# Patient Record
Sex: Male | Born: 1975 | Race: Black or African American | Hispanic: No | Marital: Single | Smoking: Former smoker
Health system: Southern US, Community
[De-identification: ages and names within clinical notes are randomized; demographics above are authoritative.]

## PROBLEM LIST (undated history)

## (undated) ENCOUNTER — Emergency Department (HOSPITAL_COMMUNITY): Payer: MEDICAID | Source: Home / Self Care

## (undated) DIAGNOSIS — F1910 Other psychoactive substance abuse, uncomplicated: Secondary | ICD-10-CM

## (undated) DIAGNOSIS — Z9114 Patient's other noncompliance with medication regimen: Secondary | ICD-10-CM

## (undated) DIAGNOSIS — Z91148 Patient's other noncompliance with medication regimen for other reason: Secondary | ICD-10-CM

## (undated) DIAGNOSIS — I10 Essential (primary) hypertension: Secondary | ICD-10-CM

## (undated) DIAGNOSIS — F319 Bipolar disorder, unspecified: Secondary | ICD-10-CM

## (undated) DIAGNOSIS — I119 Hypertensive heart disease without heart failure: Secondary | ICD-10-CM

## (undated) DIAGNOSIS — Z9861 Coronary angioplasty status: Secondary | ICD-10-CM

## (undated) DIAGNOSIS — Z720 Tobacco use: Secondary | ICD-10-CM

## (undated) DIAGNOSIS — F209 Schizophrenia, unspecified: Secondary | ICD-10-CM

## (undated) DIAGNOSIS — I472 Ventricular tachycardia: Secondary | ICD-10-CM

## (undated) DIAGNOSIS — I255 Ischemic cardiomyopathy: Secondary | ICD-10-CM

## (undated) DIAGNOSIS — I2510 Atherosclerotic heart disease of native coronary artery without angina pectoris: Secondary | ICD-10-CM

## (undated) DIAGNOSIS — F419 Anxiety disorder, unspecified: Secondary | ICD-10-CM

## (undated) DIAGNOSIS — I4729 Other ventricular tachycardia: Secondary | ICD-10-CM

## (undated) DIAGNOSIS — E785 Hyperlipidemia, unspecified: Secondary | ICD-10-CM

## (undated) HISTORY — DX: Hypertensive heart disease without heart failure: I11.9

## (undated) HISTORY — PX: FEMORAL ARTERY STENT: SHX1583

---

## 1998-11-12 ENCOUNTER — Emergency Department (HOSPITAL_COMMUNITY): Admission: EM | Admit: 1998-11-12 | Discharge: 1998-11-13 | Payer: Self-pay | Admitting: Emergency Medicine

## 2000-01-03 ENCOUNTER — Emergency Department (HOSPITAL_COMMUNITY): Admission: EM | Admit: 2000-01-03 | Discharge: 2000-01-03 | Payer: Self-pay | Admitting: Emergency Medicine

## 2000-05-03 ENCOUNTER — Emergency Department (HOSPITAL_COMMUNITY): Admission: EM | Admit: 2000-05-03 | Discharge: 2000-05-03 | Payer: Self-pay | Admitting: *Deleted

## 2001-10-25 ENCOUNTER — Emergency Department (HOSPITAL_COMMUNITY): Admission: EM | Admit: 2001-10-25 | Discharge: 2001-10-25 | Payer: Self-pay | Admitting: Emergency Medicine

## 2001-10-26 ENCOUNTER — Emergency Department (HOSPITAL_COMMUNITY): Admission: EM | Admit: 2001-10-26 | Discharge: 2001-10-26 | Payer: Self-pay | Admitting: Emergency Medicine

## 2002-05-22 ENCOUNTER — Emergency Department (HOSPITAL_COMMUNITY): Admission: AD | Admit: 2002-05-22 | Discharge: 2002-05-22 | Payer: Self-pay | Admitting: Emergency Medicine

## 2002-12-27 ENCOUNTER — Inpatient Hospital Stay (HOSPITAL_COMMUNITY): Admission: AD | Admit: 2002-12-27 | Discharge: 2002-12-28 | Payer: Self-pay | Admitting: Emergency Medicine

## 2003-01-01 ENCOUNTER — Encounter (INDEPENDENT_AMBULATORY_CARE_PROVIDER_SITE_OTHER): Payer: Self-pay | Admitting: *Deleted

## 2003-01-01 ENCOUNTER — Ambulatory Visit (HOSPITAL_COMMUNITY): Admission: RE | Admit: 2003-01-01 | Discharge: 2003-01-01 | Payer: Self-pay | Admitting: Dentistry

## 2003-01-16 ENCOUNTER — Encounter: Admission: RE | Admit: 2003-01-16 | Discharge: 2003-01-16 | Payer: Self-pay | Admitting: Dentistry

## 2006-01-29 ENCOUNTER — Emergency Department (HOSPITAL_COMMUNITY): Admission: EM | Admit: 2006-01-29 | Discharge: 2006-01-29 | Payer: Self-pay | Admitting: Emergency Medicine

## 2007-02-04 ENCOUNTER — Inpatient Hospital Stay (HOSPITAL_COMMUNITY): Admission: EM | Admit: 2007-02-04 | Discharge: 2007-02-07 | Payer: Self-pay | Admitting: Emergency Medicine

## 2007-02-05 HISTORY — PX: CORONARY ANGIOPLASTY WITH STENT PLACEMENT: SHX49

## 2009-01-15 ENCOUNTER — Observation Stay (HOSPITAL_COMMUNITY): Admission: EM | Admit: 2009-01-15 | Discharge: 2009-01-16 | Payer: Self-pay | Admitting: Emergency Medicine

## 2009-01-16 ENCOUNTER — Encounter: Payer: Self-pay | Admitting: Physician Assistant

## 2009-01-16 HISTORY — PX: CARDIAC CATHETERIZATION: SHX172

## 2009-01-27 ENCOUNTER — Ambulatory Visit: Payer: Self-pay | Admitting: Physician Assistant

## 2009-01-27 DIAGNOSIS — E785 Hyperlipidemia, unspecified: Secondary | ICD-10-CM

## 2009-01-27 DIAGNOSIS — F329 Major depressive disorder, single episode, unspecified: Secondary | ICD-10-CM

## 2009-01-27 DIAGNOSIS — I10 Essential (primary) hypertension: Secondary | ICD-10-CM

## 2009-01-27 DIAGNOSIS — I2510 Atherosclerotic heart disease of native coronary artery without angina pectoris: Secondary | ICD-10-CM

## 2009-01-27 DIAGNOSIS — Z9861 Coronary angioplasty status: Secondary | ICD-10-CM

## 2009-01-27 DIAGNOSIS — K2990 Gastroduodenitis, unspecified, without bleeding: Secondary | ICD-10-CM

## 2009-01-27 DIAGNOSIS — F1910 Other psychoactive substance abuse, uncomplicated: Secondary | ICD-10-CM

## 2009-01-27 DIAGNOSIS — K2970 Gastritis, unspecified, without bleeding: Secondary | ICD-10-CM | POA: Insufficient documentation

## 2009-01-27 LAB — CONVERTED CEMR LAB
Glucose, Urine, Semiquant: NEGATIVE
Nitrite: NEGATIVE
Protein, U semiquant: NEGATIVE
Rapid HIV Screen: NEGATIVE
WBC Urine, dipstick: NEGATIVE
pH: 6

## 2009-01-28 ENCOUNTER — Encounter: Payer: Self-pay | Admitting: Physician Assistant

## 2009-01-28 LAB — CONVERTED CEMR LAB
Amphetamine Screen, Ur: NEGATIVE
Barbiturate Quant, Ur: NEGATIVE
Benzodiazepines.: NEGATIVE
Cocaine Metabolites: NEGATIVE
Creatinine,U: 127.1 mg/dL
Marijuana Metabolite: POSITIVE — AB
Methadone: NEGATIVE
Opiate Screen, Urine: NEGATIVE
Phencyclidine (PCP): NEGATIVE
Propoxyphene: NEGATIVE

## 2010-02-12 NOTE — Letter (Signed)
Summary: Discharge Summary  Discharge Summary   Imported By: Arta Bruce 03/27/2009 15:48:23  _____________________________________________________________________  External Attachment:    Type:   Image     Comment:   External Document

## 2010-02-12 NOTE — Progress Notes (Signed)
Summary: Office Visit//DEPRESSION SCREENING  Office Visit//DEPRESSION SCREENING   Imported By: Arta Bruce 03/11/2009 15:12:16  _____________________________________________________________________  External Attachment:    Type:   Image     Comment:   External Document

## 2010-02-12 NOTE — Letter (Signed)
Summary: PT INFORMATION SHEET  PT INFORMATION SHEET   Imported By: Arta Bruce 03/13/2009 14:17:18  _____________________________________________________________________  External Attachment:    Type:   Image     Comment:   External Document

## 2010-02-12 NOTE — Letter (Signed)
Summary: *HSN Results Follow up  [facility]  [address] [place], [place] [address]   Phone: [phone]  Fax: [phone]      01/28/2009   [name] Butrum [address] [place], [place]  [address]   Dear  [name],                            ____S.Drinkard,FNP   ____D. Gore,FNP       ____B. McPherson,MD   ____V. Rankins,MD    ____E. Mulberry,MD    ____N. Daphine Deutscher, FNP  ____D. Reche Dixon, MD    ____K. Philipp Deputy, MD    __x__S. Alben Spittle, PA-C     This letter is to inform you that your recent test(s):  _______Pap Smear    ___x____Lab Test     _______X-ray    ___x____ is within acceptable limits  _______ requires a medication change  _______ requires a follow-up lab visit  _______ requires a follow-up visit with your provider   Comments:       _________________________________________________________ If you have any questions, please contact our office                     Sincerely,  Justin Newcomer PA-C [facility]

## 2010-02-12 NOTE — Assessment & Plan Note (Signed)
Summary: XFU-UNSTABLE ANGINA//DS   Vital Signs:  Patient profile:   35 year old male Height:      66.25 inches Weight:      154 pounds BMI:     24.76 Temp:     97.8 degrees F oral Pulse rate:   53 / minute Pulse rhythm:   regular Resp:     18 per minute BP sitting:   109 / 66  (left arm) Cuff size:   regular  Vitals Entered By: [place] Shannon (January 27, 2009 11:28 AM) CC: xfu... Is Patient Diabetic? No Pain Assessment Patient in pain? no       Does patient need assistance? Functional Status Self care Ambulation Normal   CC:  xfu....  History of Present Illness: Here to est. as new patient. Has a h/o CAD s/p ACS in 2009 tx with BMS to the RCA. He went to ED with n/v and was thought to have ACS.  He r/o and had a cath that showed nonobs disease and a patent stent in the RCA. He did not get meds filled after d/c.  He has been taking some meds from his mom (Lipitor, Plavix, Fish oil, Niacin, Vitamin D and CoQ10 and ASA). He denies any chest pain or shortness of breath since d/c. He continues to have epigastric pain and nausea and vomiting.  He denies water brash symptoms or dysphagia.  He does not have the omeprazole that was prescribed to him.  He states he felt better in the hospital while taking this medicine. He notes feelings of depression and has felt this way for a long time.  Never been on med.  He denies suicidal ideations.  He states he does have a h/o suicide attempt in the past.  He also admits to a h/o crack abuse.  He says he has been clean.  He admits to marijuana use and occ. ETOH.  No UDS done in the hospital, but, the notes indicate he was + for cocaine.  He denies this.    Habits & Providers  Alcohol-Tobacco-Diet     Alcohol drinks/day: 1     Tobacco Status: current     Cigarette Packs/Day: 0.5     Pack years: 7+ years  Exercise-Depression-Behavior     Drug Use: yes  Current Medications (verified): 1)  Metoprolol Tartrate 25 Mg Tabs (Metoprolol  Tartrate) .... One Tab Twice Daily 2)  Omega-3 Fish Oil 1200 Mg Caps (Omega-3 Fatty Acids) .... Once Daily 3)  Potassium Gluconate 595 Mg Tabs (Potassium Gluconate) .... Once Daily 4)  Vitamin D 1000 Unit Tabs (Cholecalciferol) .... Once Daily 5)  Niacin 500 Mg Tabs (Niacin) .... Once Daily 6)  Co Q10 100 Mg Tabs (Coenzyme Q10) .... Once Daily  Allergies (verified): No Known Drug Allergies  Past History:  Past Medical History: Coronary artery disease   a.  NSTEMI 2009:  bare metal stent to RCA   b.  Cath 01/2009 for ?[place]:  EF 40%; RCA 20% in-stent; Ramus branch with 60% Hyperlipidemia Sinus Bradycardia Polysubstance Abuse Hypertension (? 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 CV:  Denies chest pain or discomfort, difficulty breathing while lying down, fainting, shortness of breath with exertion, and swelling of feet. Resp:  Denies cough. GI:  Denies bloody stools and dark tarry stools. GU:  Denies hematuria. Psych:  Complains of depression.  Physical Exam  General:  alert, well-developed, and well-nourished.   Head:  normocephalic and atraumatic.   Neck:  supple, no carotid bruits, and no cervical lymphadenopathy.   Lungs:  normal breath sounds, no crackles, and no wheezes.   Heart:  normal rate, regular rhythm, and no murmur.   Abdomen:  soft, normal bowel sounds, no hepatomegaly, and epigastric tenderness.   Extremities:  no edema  Neurologic:  alert & oriented X3 and cranial nerves II-XII intact.     Psych:  normally interactive and good eye contact.     Impression & Recommendations:  Problem # 1:  HYPERTENSION (ICD-401.9) BP is optimal without meds do not think he needs enalapril at this time  The following medications were removed from the medication list:    Metoprolol Tartrate 25 Mg Tabs (Metoprolol tartrate) ..... One tab twice daily  Problem # 2:  HYPERLIPIDEMIA (ICD-272.4) out of control important to treat this and explained to patient get rx for med and recheck in 2-3 mos  His updated medication list for this problem includes:    Niacin 500 Mg Tabs (Niacin) ..... Once daily    Simvastatin 40 Mg Tabs (Simvastatin) .Marland Kitchen... Take 1 tab by mouth at bedtime  Problem # 3:  CORONARY ARTERY DISEASE (ICD-414.00) doing well post cath needs to remain on ASA he does not need to continue taking plavix plavix may be aggravating stomach as well not supposed to be on metoprolol due to sinus brady   The following medications were removed from the medication list:    Metoprolol Tartrate 25 Mg Tabs (Metoprolol tartrate) ..... One tab twice daily His updated medication list for this problem includes:    Aspir-low 81 Mg Tbec (Aspirin) .Marland Kitchen... Take 1 tablet by mouth once a day  Problem # 4:  SUBSTANCE ABUSE (ICD-305.90)  refer to LCSW  Orders: Social Work Referral (Social )  Problem # 5:  Preventive Health Care (ICD-V70.0)  CBC, CMP, TSH all ok in hosp get HIV, RPR, UDS  Orders: T-HIV Antibody  (Reflex) [phone]) T-Drug Screen-Urine, (single) [phone]) T-Syphilis Test (RPR) ([id number])  Problem # 6:  DEPRESSION (ICD-311)  Patient notes symptoms of depression for most of his life. Denies suicidal ideations or plan. His PHQ9 = 18 in the office Refer to LCSW start zoloft Patient understands he should not drink with med.   His updated medication list for this problem includes:    Zoloft 50 Mg Tabs (Sertraline hcl) .Marland Kitchen... Take 1 tablet by mouth once a  day  Problem # 7:  GASTRITIS (ICD-535.50) may be worsened by taking plavix fill PPI and start plan to taper off PPI over next 1-2 mos if stable  His updated medication list for this problem includes:    Omeprazole 20 Mg Cpdr (Omeprazole) .Marland Kitchen... Take 1 tablet by mouth once a day  Complete Medication List: 1)  Omega-3 Fish Oil 1200 Mg Caps (Omega-3 fatty acids) .... Once daily 2)  Vitamin D 1000 Unit Tabs (Cholecalciferol) .... Once daily 3)  Niacin 500 Mg Tabs (Niacin) .... Once daily 4)  Co Q10 100 Mg Tabs (Coenzyme q10) .... Once daily 5)  Omeprazole 20 Mg Cpdr (Omeprazole) .... Take 1 tablet by mouth once a day 6)  Simvastatin 40 Mg Tabs (Simvastatin) .... Take 1 tab by mouth at bedtime 7)  Aspir-low 81 Mg Tbec (Aspirin) .... Take 1 tablet by mouth once a day 8)  Zoloft 50 Mg Tabs (Sertraline hcl) .... Take 1 tablet by mouth once a day  Patient Instructions: 1)  The simvastatin (cholesterol medicine), omeprazole (stomach medicine) and aspirin have been faxed to the [facility]. pharmacy.  You should be able to pick those up in the next 2-3 days.   2)  Please schedule a follow-up appointment in 2 weeks with Lorin Picket for depression. 3)  Schedule appointment with Marchelle Folks this week. Prescriptions: ZOLOFT 50 MG TABS (SERTRALINE HCL) Take 1 tablet by mouth once a day  #30 x 2   Entered and Authorized by:   Tereso Newcomer PA-C   Signed by:   Tereso Newcomer PA-C on 01/27/2009   Method used:   Faxed to ...       [facility] (retail)       [address] [place], [place]  [address]       Ph: [phone]       Fax: [phone]   RxID:   [id number] OMEPRAZOLE 20 MG CPDR (OMEPRAZOLE) Take 1 tablet by mouth once a day  #30 x 2   Entered and Authorized by:   Tereso Newcomer PA-C   Signed by:   Tereso Newcomer PA-C on 01/27/2009   Method used:   Faxed to ...       [facility] - [facility] (retail)       [address] [place], [place]  [address]       Ph: [phone]       Fax: [phone]   RxID:   [id number] ASPIR-LOW 81 MG TBEC (ASPIRIN) Take 1 tablet by mouth once a day  #30 x 11   Entered and Authorized by:   Tereso Newcomer PA-C   Signed by:   Tereso Newcomer PA-C on 01/27/2009   Method used:   Faxed to ...       [facility] - Pharmac (retail)       [address] [place], [place]  [address]       Ph: [phone]       Fax: [phone]   RxID:   [id number] SIMVASTATIN 40 MG TABS (SIMVASTATIN) Take 1 tab by mouth at bedtime  #30 x 3   Entered and Authorized by:   Tereso Newcomer PA-C   Signed by:   Tereso Newcomer PA-C on 01/27/2009   Method used:   Faxed to ...       [facility] - Pharmac (retail)       [address] [place], [place]  [address]       Ph: [phone]       Fax: [phone]   RxID:   [id number]   Laboratory Results   Urine Tests  Date/Time Received: January 27, 2009 2:53 PM   Routine Urinalysis   Glucose: negative   (Normal Range: Negative) Bilirubin: negative   (Normal Range: Negative) Ketone: negative   (Normal Range: Negative) Spec. Gravity: 1.025   (Normal Range: 1.003-1.035) Blood: negative   (Normal Range: Negative) pH: 6.0   (Normal Range:  5.0-8.0) Protein: negative   (Normal Range: Negative) Urobilinogen: 0.2   (Normal Range: 0-1) Nitrite: negative   (Normal Range: Negative) Leukocyte Esterace: negative   (Normal Range: Negative)    Date/Time Received: January 27, 2009 2:53 PM   Other Tests  Rapid HIV: negative

## 2010-03-29 LAB — COMPREHENSIVE METABOLIC PANEL
ALT: 25 U/L (ref 0–53)
AST: 24 U/L (ref 0–37)
Albumin: 4.4 g/dL (ref 3.5–5.2)
CO2: 26 mEq/L (ref 19–32)
Calcium: 9.2 mg/dL (ref 8.4–10.5)
Creatinine, Ser: 1.08 mg/dL (ref 0.4–1.5)
GFR calc Af Amer: >60 mL/min (ref >60)
Sodium: 135 mEq/L (ref 135–145)
Total Protein: 7.6 g/dL (ref 6.0–8.3)

## 2010-03-29 LAB — DIFFERENTIAL
Eosinophils Absolute: 0 10*3/uL (ref 0.0–0.7)
Eosinophils Relative: 1 % (ref 0–5)
Lymphocytes Relative: 55 % — ABNORMAL HIGH (ref 12–46)
Lymphs Abs: 2.6 10*3/uL (ref 0.7–4.0)
Monocytes Absolute: 0.3 10*3/uL (ref 0.1–1.0)
Monocytes Relative: 7 % (ref 3–12)

## 2010-03-29 LAB — POCT CARDIAC MARKERS
CKMB, poc: <1 ng/mL — ABNORMAL LOW (ref 1.0–8.0)
Troponin i, poc: <0.05 ng/mL (ref 0.00–0.09)

## 2010-03-29 LAB — CARDIAC PANEL(CRET KIN+CKTOT+MB+TROPI)
CK, MB: 0.9 ng/mL (ref 0.3–4.0)
Relative Index: 0.7 (ref 0.0–2.5)
Relative Index: 0.7 (ref 0.0–2.5)
Relative Index: 0.7 (ref 0.0–2.5)
Total CK: 143 U/L (ref 7–232)
Troponin I: 0.01 ng/mL (ref 0.00–0.06)
Troponin I: 0.03 ng/mL (ref 0.00–0.06)

## 2010-03-29 LAB — CBC
HCT: 40 % (ref 39.0–52.0)
MCHC: 34 g/dL (ref 30.0–36.0)
MCHC: 34.3 g/dL (ref 30.0–36.0)
MCV: 93.1 fL (ref 78.0–100.0)
MCV: 94.4 fL (ref 78.0–100.0)
Platelets: 248 10*3/uL (ref 150–400)
RBC: 4.24 MIL/uL (ref 4.22–5.81)
RBC: 4.64 MIL/uL (ref 4.22–5.81)
WBC: 4.2 10*3/uL (ref 4.0–10.5)

## 2010-03-29 LAB — PROTIME-INR: Prothrombin Time: 14.3 seconds (ref 11.6–15.2)

## 2010-03-29 LAB — TSH
TSH: 0.681 u[IU]/mL (ref 0.350–4.500)
TSH: 0.79 u[IU]/mL (ref 0.350–4.500)

## 2010-03-29 LAB — URINALYSIS, ROUTINE W REFLEX MICROSCOPIC
Nitrite: NEGATIVE
Specific Gravity, Urine: 1.021 (ref 1.005–1.030)
Urobilinogen, UA: 1 mg/dL (ref 0.0–1.0)
pH: 6 (ref 5.0–8.0)

## 2010-03-29 LAB — LIPASE, BLOOD: Lipase: 15 U/L (ref 11–59)

## 2010-03-29 LAB — CK TOTAL AND CKMB (NOT AT ARMC): CK, MB: 0.9 ng/mL (ref 0.3–4.0)

## 2010-03-29 LAB — LIPID PANEL: Cholesterol: 317 mg/dL — ABNORMAL HIGH (ref 0–200)

## 2010-05-26 NOTE — Cardiovascular Report (Signed)
NAMEDAGMAWI, VENABLE NO.:  [id number]   MEDICAL RECORD NO.:  [id number]          PATIENT TYPE:  INP   LOCATION:  2908                         FACILITY:  [facility]   PHYSICIAN:  Nicki Guadalajara, M.D.     DATE OF BIRTH:  December 20, 1975   DATE OF PROCEDURE:  DATE OF DISCHARGE:                            CARDIAC CATHETERIZATION   PROCEDURE PERFORMED:  Emergency cardiac catheterization:  Cine coronary  angiography; cine left ventriculography; percutaneous coronary  intervention with stenting of the right coronary artery utilizing double  bolus Integrilin/oral Plavix/I.C. and I.V. nitroglycerin in addition to  heparin.   INDICATIONS:  Mr. Sigurd Pugh. Depoy is a 35 year old African American  male who has a strong family history for premature coronary artery  disease.  Also has been of having elevated lipids.  He has been smoking  since age 23 on a daily basis and often has used marijuana.  Over the  past week, he has noticed several episodes of recurrent pressure  sensations in his chest.  At times, these would occur after walking.  Today, after he was attempting to cook food, he He experienced severe  substernal chest pressure.  The chest pain intensified.  EMS ultimately  was called and arrived.  He was given nitroglycerin sublingually and  also received 2 mg of morphine.  He then apparently noticed significant  improvement in the chest pain but also was found to have ventricular  ectopy on ECG recording.  His electrocardiograms were transferred by EMS  to the Emergency Department, and he was then coded as a possible code  STEMI inferior myocardial infarction.  Upon arrival to the Emergency  Room, his chest pain was markedly improved, although he still has  residual 1/10 discomfort.  ECG now showed resolution of prior J-point  and inferior ST elevation.  There was resolution of previous what was  felt to be significant ST elevation, but these were predominantly seen  in  ectopic beats; however, due to his worrisome history and with  possible RCA ischemia/acute coronary syndrome with superimposed spasm  improved with nitroglycerin, definitive cardiac catheterization was  recommended, and he was taken emergently to the cardiac catheterization  laboratory.   PROCEDURE IN DETAIL:  The patient was prepped and draped in the usual  fashion.  He had previously received 4 baby aspirin.  He was given 2 mg  of Versed for sedation.  He also was on I.V. nitroglycerin, which was  started in the Emergency Room.  Cardiac catheterization was done via the  right femoral artery.  The right femoral artery was punctured  anteriorly, and a #6-French sheath was inserted.  Diagnostic  catheterization was done utilizing a #6-French left and #6-French right  catheters.  Due to some concern about possible abnormality in the ramus  intermediate vessel in the left system, I.C. nitroglycerin 200 mcg was  administered, and several additional views were taken.  I.C.  nitroglycerin was also administered via the right catheter with the  demonstration of focal distal RCA narrowing before the PDA of at least  80%.  Following I.C. nitroglycerin, there was still significant  narrowing, suggesting fixed obstruction.  A pigtail catheter was used  for biplane selective cineangiography.  At this point, the angiographic  studies were reviewed with the patient.  The decision was made to  attempt primary intervention to the RCA system.   The patient was given double  bolus Integrilin and 4000 units of weight-  adjusted heparin for anticoagulation.  He also received 670 mg p.o.  Plavix.  A #6-French FR-4 guide was used for the interventional  procedure.  A Prowater wire was advanced down the RCA after  documentation of therapeutic anticoagulation.  Primary stenting was  done; however, prior to a decision concerning the type of stent,  discussion was made with the patient concerning the pros and  cons of DES  stenting versus non-DES stenting in this 35 year old male with  consideration for Plavix long-term versus short-term.  The decision was  made ultimately in light of his large vessel to use a 3.5 x 18-mm non-  drug-eluting vision stent.  This was dilated x2 up to 13 or 14  atmospheres.  Post stent dilatation was done utilizing a 3.75 x 15-mm  Quantum balloon.  Scout angiography confirmed an excellent angiographic  result.   The patient tolerated the procedure well and returned to his room in  satisfactory condition.   HEMODYNAMIC DATA:  Central aortic pressure was 130/85.  Left ventricular  pressure was 130/4 post A-wave 23.   ANGIOGRAPHIC DATA:  The left main coronary was angiographically normal  and trifurcated into an LAD, a small intermediate vessel and a large  circumflex system.   The LAD gave rise to 2 major diagonal vessels, several septal  perforating arteries and wrapped around the LV apex.  The LAD and its  branches were free of significant disease.   The intermediate vessel immediately became somewhat aneurysmal just  after its origin and then was small caliber beyond this.  Following I.C.  nitroglycerin, this did not significantly change although the ostium  appeared less narrowed from initially greater than 60% to approximately  30% just prior to the aneurysmal segment.   The circumflex vessel was a moderate-sized vessel that gave rise to a  large diagonal branch.  The AV groove circumflex after the diagonal  branch had luminal irregularity with 20% narrowing.   The right coronary artery was a large-caliber dominant vessel.  There  was mild luminal irregularity of 20% in the mid segment and in the  region of the crux.  Beyond the crux, prior to a small inferior LV  branch, which occurred prior to a large PDA, there was 80% narrowing  just proximal to an ectatic segment.  This did not significantly improve  following I.C. nitroglycerin  administration.   Biplane selective ventriculography revealed preserved global  contractility with an acute ejection fraction of 50% to 55%; however,  there was focal mid distal inferior hypocontractility on the RAO  projection and low to mid posterolateral hypocontractility on the LAO  projection.   Following percutaneous coronary intervention to the right coronary  artery with primary stenting, an insertion of a 3.5 x 18-mm non-drug-  eluting vision stent postdilated 3.75 mm, the 80% stenosis was reduced  to zero percent.  There was brisk TIMI-III flow.  There was no evidence  for dissection.  At the completion of the procedure, the patient was  entirely pain free with stable hemodynamics.           ______________________________  Nicki Guadalajara, M.D.     TK/MEDQ  D:  02/04/2007  T:  02/05/2007  Job:  [id number]

## 2010-05-26 NOTE — Discharge Summary (Signed)
[name], [name]              ACCOUNT NO.:  [id number]   MEDICAL RECORD NO.:  [id number]          PATIENT TYPE:  INP   LOCATION:  2010                         FACILITY:  [facility]   PHYSICIAN:  Nicki Guadalajara, M.D.     DATE OF BIRTH:  03-05-1975   DATE OF ADMISSION:  02/04/2007  DATE OF DISCHARGE:  02/07/2007                               DISCHARGE SUMMARY   DISCHARGE DIAGNOSES:  1. Acute coronary syndrome status post right coronary artery stenting      with nondrug-eluting stent.  2. Hypertension.  3. Hyperlipidemia.  4. Substance abuse.  5. Premature family history of coronary artery disease.   HISTORY:  This is a 35 year old African-American male who developed  severe episodes of chest pain that literally brought him to his knees at  home.  He took aspirin.  EMS was summoned and the patient was taken to  [facility] Emergency Room.  Pain was relieved with morphine and 4 pills  of baby aspirin.  At that time, the patient had ST elevations on his  EKG.  After resolution of pain, the ST-segments came back to __________  .  He was taken to the emergency room at [facility] in code  STEMI inferior myocardial infarction was initiated, but when he arrived  to [facility], his pain was significantly improved to being 1 on a scale  from 1-10.  There was no significant ST elevations on the EKG.  The  patient was immediately taken to the cardiac catheterization lab and  underwent coronary angiography stat performed by Dr. Tresa Endo.  It revealed  normal LAD and left main artery.  He had intermediate vessel with 30%  narrowing prior to aneurysmal dilatation segment.  AV groove circumflex  had 20% luminal irregularities.  RCA was a large artery and had 20% of  luminal irregularity in the mid segment and beyond the crux.  There was  80% narrowing that did not significantly improve after nitroglycerin  administration.  Ejection fraction was 50-55%.  Dr. Tresa Endo performed  stenting of the RCA  with a nondrug-eluting stent and the narrowing was  reduced from  80-0%.  The patient tolerated the procedure well and was transferred to  the unit in stable condition.   LABORATORY DATA:  His urine drug screen revealed positive result for  marijuana and opioids.  Cardiac enzymes were CK 123, MB 10.1, troponin  1.57.  Second set revealed CK 113, MB 8.0,  troponin 116.  Hemoglobin  13.6, hematocrit 40.6, white blood cells count 8.1, platelets 243, total  cholesterol 217, triglycerides 75, HDL 29, LDL 173.   DISPOSITION:  The day of discharge, the patient was ambulating with  cardiac rehab without any difficulties.  He was being discharged home.   DISCHARGE MEDICATIONS:  1. Aspirin 325 mg daily.  2. Plavix 75 mg daily.  3. Lopressor 25 mg b.i.d.  4. Zocor 40 mg daily.  5. Altace 2.5 mg b.i.d.  6. Nitroglycerin 0.4 mg p.r.n.   DISCHARGE FOLLOWUP:  Scheduled in our office with Dr. Tresa Endo on February 22, 2007 at 11:30.  Raymon Mutton, P.A.    ______________________________  Nicki Guadalajara, M.D.    MK/MEDQ  D:  02/07/2007  T:  02/07/2007  Job:  [id number]   cc:   Nicki Guadalajara, M.D.

## 2010-05-29 NOTE — Op Note (Signed)
[name], [name]                          ACCOUNT NO.:  [id number]   MEDICAL RECORD NO.:  [id number]                   PATIENT TYPE:  OIB   LOCATION:  2858                                 FACILITY:  [facility]   PHYSICIAN:  Charlynne Pander, D.D.S.          DATE OF BIRTH:  Mar 22, 1975   DATE OF PROCEDURE:  01/01/2003  DATE OF DISCHARGE:  01/01/2003                                 OPERATIVE REPORT   PREOPERATIVE DIAGNOSIS:  1. History of right facial swelling.  2. History of acetaminophen toxicity.  3. Chronic apical periodontitis.  4. Periapical abscess.  5. Chronic periodontitis.  6. Accretions.  7. Multiple retained root segments.  8. Dental caries.   POSTOPERATIVE DIAGNOSIS:  1. History of right facial swelling.  2. History of acetaminophen toxicity.  3. Chronic apical periodontitis.  4. Periapical abscess.  5. Chronic periodontitis.  6. Accretions.  7. Multiple retained root segments.  8. Dental caries.   OPERATION:  1. Dental examination.  2. Extraction of multiple teeth (teeth numbers 5, 12, 15, 16, 17, 18, 19,     30, 31, and 32).  3. Three quadrants of alveoplasty.  4. Soft tissue biopsy of the mandibular soft tissue cyst in the area of     number 19, pathology pending.  5. Gross debridement of the remaining dentition.   SURGEON:  Charlynne Pander, D.D.S.   ASSISTANT:  Elliot Dally, dental assistant   ANESTHESIA:  General anesthesia with nasal endotracheal tube.   MEDICATIONS:  1. Clindamycin 600 mg IV prior to invasive dental procedures.  2. Local anesthesia with the total utilization of 6 carpules each containing     36 mg Xylocaine with 0.018 mg epinephrine.  3. Toradol 30 mg IV at the end of the dental medicine procedure.   SPECIMENS:  1. There were ten teeth which were discarded.  2. There was a 20 mm by 10 mm soft tissue cyst forwarded to pathology for     evaluation.  This was from a periapical area associated with tooth number     19.   CULTURES:  None.   DRAINS:  None.   ESTIMATED BLOOD LOSS:  100 mL.   FLUIDS REPLACED:  650 mL lactated ringers solution.   COMPLICATIONS:  None.   INDICATIONS FOR PROCEDURE:  The patient had a history of right facial  swelling and acetaminophen toxicity.  A dental consultation was requested dn  the patient was evaluated for dental treatment.  The patient was treatment  planned for extraction of multiple teeth with alveoloplasty is indicated  along with a biopsy of periapical areas associated with teeth numbers 19 and  30 as indicated.  The patient would also have gross debridement of the  remaining teeth as time and space permitted.  This treatment plan was  formulated to decrease the risks and complications associated with dental  infection from affecting the patient's systemic health.   OPERATIVE FINDINGS:  The patient was examined in operating room number 2 at  [facility].  The teeth were identified for extraction.  The patient  was noted to be affected by right facial swelling, periapical abscess,  chronic apical periodontitis, multiple retained root segments, dental  caries, chronic periodontitis, and the presence of accretions.  The  aforementioned necessitated the removal of multiple teeth with alveoplasty  as indicated, biopsy of periapical areas 19 and 30 as indicated, and  alveoplasty and gross debridement of the remaining dentition as indicated.   DESCRIPTION OF PROCEDURE:  The patient was brought to the main operating  room 2.  The patient was placed in supine position on the operating table.  The patient was then intubated utilizing a nasal endotracheal tube.  A  throat pack was placed at this time.  The patient was then prepped and  draped in the usual manner for dental medicine procedure.  The oral cavity  was thoroughly examined with the findings as noted above.  The patient was  then ready for a dental medicine procedure as follows:   Local anesthesia  was administered sequentially with the total utilization of  6 carpules each containing 36 mg of Xylocaine with 0.018 mg of epinephrine.  Local anesthesia was administered sequentially over the 1 1/2 hour long  procedure.  The maxillary quadrants were first approached.  Anesthesia was  delivered as previously described.  The maxillary right quadrant was then  approached.  A circular incision was made around tooth number 5 utilizing a  15 blade.  The flap was reflected with a Public house manager.  The appropriate  amount of buccal and interseptal bone was removed around the retained root  segment.  Root segment number 5 was then elevated with a series of elevators  and then removed with the 150 forceps without complications.  Alveoplasty  was then performed utilizing rongeurs and bone file.  The surgical site was  then irrigated with copious amounts of sterile saline.  A 3-0 chromic gut  suture was then utilized to close the surgical site via a continuous  interrupted suture from the mesial of number 4 through the distal of number  6.  A separate 4-0 chromic interrupted suture was utilized to further close  the soft tissue site.   The maxillary left quadrant was then approached.  A 15 blade incision was  made from the distal of the tuberosity through the mesial of number 12.  A  surgical flap was then reflected.  An appropriate amount of buccal and  interseptal bone was removed around teeth numbers 16, 15, and 12.  These  roots and teeth were then subluxated with a series of straight elevators.  Tooth number 16 was then removed with the 150 forceps without complications.  The retained root segments associated with number 15 were then elevated out  with a series of straight elevators with no complications.  The retained  root segment of number 13 was then elevated and removed with the 150 forceps  without complications.  Alveoplasty was then performed utilizing rongeurs and bone file.  The  surgical site was then irrigated with copious amounts of  sterile saline.  The soft tissues were then trimmed accordingly.  The  surgical site was then closed room the distal of the tuberosity through the  mesial of number 15 utilizing 3-0 chromic gut suture in a continuous  interrupted suture technique x 1.  An interrupted suture was placed between  teeth numbers 13 and 14  and at the distal of tooth number 14.  A figure-of-  eight suture was utilized to close the surgical site in the area of number  13.   The mandibular quadrants were then approached.  Anesthesia was delivered as  previously described.  The mandibular left quadrant was then approached.  A  15 blade incision was made from the distal of number 17 through the mesial  of number 20.  The surgical flap was then reflected.  The roots of 18 and 19  were then elevated out with a series of straight elevators.  This was  achieved without complications.  The surgical handpiece and bur were  utilized to remove buccal and interseptal bone around tooth number 17.  A 17  forceps was then utilized to remove number 17 without complications.  Alveoplasty was then performed utilizing rongeurs and bone file.  At this  point in time, a cyst was curetted out of the extraction site in the area of  number 19.  This will be submitted to pathology for definitive diagnosis.  This may be a periapical granuloma or cyst or may be other malignancies or  dental pathology to include odontogenic keratocyst, eosinophilic granuloma,  or possible metastatic tumor.  At this point in time, the surgical site was  irrigated with copious amounts of sterile saline.  Approximately 5-10 mL of  purulent exudate were irrigated out.  The tissues were approximated and  trimmed appropriately.  The surgical site was then closed from the distal 17  to the mesial of 19 utilizing 3-0 chromic suturing with continuous  interrupted suture technique x 1.  A separate interrupted  suture was placed  between teeth numbers 20 and 21 at this time.   The mandibular right quadrant was then approached.  Anesthesia had been  previously given.  A 15 blade incision was made from distal of number 32  through the mesial of number 30.  The surgical flap was then reflected.  The  flap was then further extended up to tooth number 29.  At this point in  time, significant purulent exudate was manipulated out with significant  pressure to a total of approximately 5-10 mL.  A series of straight  elevators were utilized to remove the retained root segments of numbers 30  and 31 without complications.  A surgical handpiece and bur were utilized to  remove buccal and interseptal bone around tooth number 32 at this time.  This tooth was then removed with a 23 forceps without complications.  Alveoplasty was then performed utilizing rongeurs and bone file.  Significant granulation tissue was removed in the periapical areas associated with teeth numbers 31 and 32 at this time.  An attempt was made  to remove apparent cystic like material in the area of number 30, however,  no significant portion of the cyst could be removed in a form suitable for  pathology evaluation.  This area was further copiously irrigated with  sterile saline with removal of approximately 5 mL more of purulent exudate.  The surgical sites were then irrigated with copious amounts of sterile  saline x 2.  The tissues were approximated and trimmed appropriately.  The  surgical site was then closed from the distal number 32 to the mesial of  number 30 utilizing 3-0 chromic gut suture in a continuous interrupted  suture technique x 1.  A separate interrupted suture was placed between  teeth numbers 28 and 29 at this time.   At this point in  time, the remaining teeth were approached.  A KaVo sonic  scaler was utilized to remove accretions associated with remaining teeth.  A  series of curets were then utilized to remove  further accretions.  The KaVo  sonic scaler was then again utilized to remove further accretions.  At this  point in time, the entire mouth was then irrigated with copious amounts of  sterile saline.  The patient was examined for complications, seeing none,  the dental medicine procedure was deemed to be complete.  The throat pack  was removed at this time.  An oral airway was placed at the request of the  anesthesia team.  The patient was then handed over to the anesthesia team  for final disposition.  After an appropriate amount of time, the patient was  extubated and taken to the post anesthesia care unit with stable vital signs  and a good oxygenation level.  Prior to transport to the post anesthesia  care unit, the patient was given 30 mg IV Toradol to assist in pain control.  The patient will be given appropriate antibiotic and  pain medications and will follow up with dental medicine in approximately  one week.  At that point in time, we will review the pathology report and  discuss potential treatment options at this time.  The patient will be  referred appropriately if pathology or malignancy is noted.                                               Charlynne Pander, D.D.S.    RFK/MEDQ  D:  01/01/2003  T:  01/02/2003  Job:  [id number]   cc:   Sherin Quarry, MD

## 2010-05-29 NOTE — Consult Note (Signed)
[name], [name]                          ACCOUNT NO.:  [id number]   MEDICAL RECORD NO.:  [id number]                   PATIENT TYPE:  INP   LOCATION:  5504                                 FACILITY:  [facility]   PHYSICIAN:  Charlynne Pander, D.D.S.          DATE OF BIRTH:  April 24, 1975   DATE OF CONSULTATION:  12/28/2002  DATE OF DISCHARGE:                                   CONSULTATION   REQUESTING PHYSICIAN:  Sherin Quarry, M.D.   [name] is a 35 year old [place] American male referred by Sherin Quarry, M.D., for a dental consultation.  The patient was admitted with  probable acetaminophen toxicity due to acute ingestion for a toothache.  Dental consultation requested to rule out dental infection which may effect  the patient's systemic health.   MEDICAL HISTORY:  1. Acetaminophen toxicity.     a. Current Mucocyst protocol per the [facility].     b. History of nausea, vomiting, and gastritis associated with the acute        acetaminophen ingestion.  2. Tobacco abuse.   ALLERGIES/ADVERSE DRUG REACTIONS:  None known.   MEDICATIONS:  1. Mucomyst per protocol.  2. Oxycodone as per p.r.n. orders.  3. Pepcid 20 mg twice daily.  4. Unasyn 3.0 g IV every six hours.   SOCIAL HISTORY:  The patient is single.  The patient has no children.  The  patient has smoked one-half to one pack per day since the age of 20.  Patient with rare use of alcohol.  The patient uses beer when he drinks.  The patient works at [employer] on [address].   FAMILY HISTORY:  Mother and father are both healthy and alive at the age of  72.   FUNCTIONAL ASSESSMENT:  The patient remains independent for ADLs at this  time.   REVIEW OF SYSTEMS:  This is reviewed from the chart and health history  assessment form this admission.   DENTAL HISTORY:   CHIEF COMPLAINT:  The patient gives a dental consultation request to rule  out dental infection which may effect the patient's systemic  health.   HISTORY OF PRESENT ILLNESS:  Patient with a history of a toothache to the  lower right quadrant which has been present off and on for the past one  year.  The patient gives a history of acute pain for the past two weeks.  The patient describes the pain as being sharp and constant in nature.  The  patient also indicates that it was a 10/10 intensity.  The patient indicates  that the pain is currently 0/10 in intensity.  The patient also complains of  swelling to the area which started several days ago.  The patient had been  taking Tylenol every two hours to relieve the pain with minimal results.  This resulted in significant nausea and vomiting and reason for emergency  room visit.   The patient last saw  a dentist approximately one year ago to have a tooth  pulled.  The patient indicates that the dentist pulled the wrong tooth at  that time.  The patient indicates that the treatment was provided while he  was incarcerated.  The patient denies complications from having that tooth  pulled, however.   DENTAL EXAMINATION:  GENERAL APPEARANCE:  The patient is a well-developed,  well-nourished male in no acute distress.  VITAL SIGNS:  The blood pressure is 124/88, pulse 88, respirations 20, and  temperature 98.4 degrees.  HEAD AND NECK:  The patient has right submandibular lymphadenopathy.  The  patient denies acute TMJ symptoms at this time.  The patient has significant  extraoral swelling associated with the mandibular right facial area.  This  is primarily localized to the area of #30-32.  PERIODONTAL:  Patient with chronic periodontitis with plaque accumulations,  gingival recession, tooth mobility, and bone loss.  DENTITION:  Patient with multiple missing teeth.  Patient with multiple  retained root segments.  DENTAL CARIES:  The patient has multiple dental caries.  I would need a full  series of dental radiographs to identify the extent of the dental caries.  ENDODONTIC:   Patient with a history of acute irreversible pulpitis symptoms  associated with the lower right quadrant.  Patient also with periapical  pathology associated with the lower right and lower left quadrants, as well  as teeth #5, 12, 15, and 16.  CROWN AND BRIDGE:  There are no crown or bridge restorations.  PROSTHODONTICS:  The patient denies the presence of dentures.  OCCLUSION:  Patient with a poor occlusal scheme secondary to multiple  missing teeth, presence of multiple root segments, supereruption and  drifting of the unopposed teeth into the edentulous areas, and lack of  replacement of the missing teeth with clinical acceptable dental  restorations.   RADIOGRAPHIC INTERPRETATION:  A panoramic x-ray was taken on December 27, 2002.   There are multiple missing teeth.  There are multiple retained root  segments.  There are dental caries.  There is supereruption and drifting of  the unopposed teeth into the edentulous areas.  There are multiple areas of  periapical pathology with extensive periapical pathology associated with  retained roots #19 and 30.  There is moderate horizontal vertical bone loss.   ASSESSMENT:  1. Acute irreversible pulpitis symptoms associated with the lower right     quadrant.  2. Right submandibular facial swelling.  3. Chronic apical periodontitis associated with multiple retained root     segments.  4. Chronic periodontitis.  5. Plaque calculous accumulations.  6. Moderate horizontal vertical bone loss.  7. Selective areas of gingival recession.  8. Multiple missing teeth.  9. Multiple retained root segments.  10.      Dental caries with a need for a full series of dental radiographs     to further evaluate the presence of caries.  This will need to be     performed by the general dentist of the patient's choice in the future. 11.      Supereruption and drifting of the unopposed teeth into the     edentulous areas.  12.      Poor occlusal scheme.   13.      History of acetaminophen toxicity.   PLAN/RECOMMENDATIONS:  I discussed the risks, benefits, and complications of  various treatment options with the patient in relationship to his medical  and dental conditions.  We discussed various treatment options to  include no  treatment, multiple extractions with alveoloplasty, preprosthetic surgery as  indicated, periodontal therapy, dental restorations, root canal therapy,  implant therapy, and the replacement of missing teeth as indicated.  The  patient currently wishes to proceed with extraction of multiple teeth to  include teeth #5, 12, 15, 16, 17, 18, 19, 30, 31, and 32 with alveoloplasty  and preprosthetic surgery as indicated.  The patient also may be interested  in periodontal therapy at that time.  These procedures will be placed in the  operating room with general anesthesia as per patient desires.  The patient  will then follow up for all other dental treatment needs with the general  dentist of the patient's choice.  I will perform a biopsy of the mandibular  left and mandibular areas #19 and 30 as indicated due to the extensive  periapical pathology in this area.  1. Discuss of findings with Sherin Quarry, M.D., as indicated.  We will     determine the medical ability/stability of the patient to undergo dental     procedures at this time and the tentative operating room date of Tuesday,     January 01, 2003, at 10:45 a.m.  If the patient is discharged before     this time, this will be done in the outpatient setting through short     stay.  In the meantime, the patient should be continued on IV antibiotic     therapy and then switched over to oral antibiotic therapy at that time.     Dr. Tresa Endo can provide pain medication as indicated per his discretion.  2. Consider use of chlorhexidine 0.12% rinses twice daily to assist in     disinfection of the oral cavity at this time.  The patient is to rinse     with 15 ml twice  daily and spit out the excess.                                               Charlynne Pander, D.D.S.    RFK/MEDQ  D:  12/28/2002  T:  12/29/2002  Job:  [id number]   cc:   Sherin Quarry, MD

## 2010-05-29 NOTE — Discharge Summary (Signed)
[name], KREIS NO.:  [id number]   MEDICAL RECORD NO.:  [id number]                   PATIENT TYPE:  INP   LOCATION:  5504                                 FACILITY:  [facility]   PHYSICIAN:  Sherin Quarry, MD                   DATE OF BIRTH:  1975-12-14   DATE OF ADMISSION:  12/27/2002  DATE OF DISCHARGE:                                 DISCHARGE SUMMARY   [name] is a 35 year old man who has basically been in good health.  He has a longstanding history of very severe dental disease with multiple  carious teeth. For two days prior to admission, he had experienced increased  pain in his jaw and gums and because of this two Tylenol every two hours,  totally about 30 Tylenol. He presented to the emergency room with persistent  vomiting and gastritis and his Tylenol level was noted to be 19. Although  this is not generally considered to be a toxic level, it was the  recommendation of Poison Control that, in light of the fact that his Tylenol  level was apparently elevated over a long period, they recommended a 24-hour  course of Mucomyst. He denied headache, coughing, chest pain or diarrhea.   At the time of admission, physical examination revealed a somewhat  nauseated, lethargic appearing man.  HEENT:  The patient had terrible looking teeth with swollen abscessed gums.  CHEST:  Clear. Cardiovascular exam revealed normal S1/S2. No rubs, murmurs  or gallops.  ABDOMEN:  Benign. Normal bowel sounds. No masses or tenderness or  organomegaly.  NEUROLOGIC:  Sensory and motor function intact in all extremities normal.   Tylenol level was 19.1. Serum was 135, potassium 3.5, CO2 26, creatinine was  1.2, chem panel was normal. Amylase and lipase were normal. Urinalysis was  normal.   On presentation to the emergency room, the patient was given Mucomyst 140  mg/kg. He was also advised to take 5 additional doses of 70 mg/kg every four  hours. Oxycodone  was ordered for pain and the patient was placed on Unasyn 3  grams every six hours because of the evidence of gum infection. On December  17 the patient was seen in consultation by Dr. Kristin Bruins who was impressed  with the patient's extensive dental disease and gum abscess. He recommended  that the teeth were nonrestorable and the best way to manage this problem  would be with multiple dental extractions. He scheduled the patient for  operation on Tuesday at 10:45 a.m.  The patient was clearly advised of the  date and location of this operation. On December 17 the patient was  discharged.   DISCHARGE DIAGNOSES:  1. Excessive chronic Tylenol ingestion.  2. Nausea and vomiting with gastritis, presumably secondary to #1.  3. Multiple carious teeth with gum abscess.  4. A 20-pack-a-year smoking history.   He was discharged on Augmentin 875  mg b.i.d. for 5 days. He was given a  prescription for oxycodone 5 mg, and instructed to take 1-2 every 4 hours  p.r.n. for pain.   Followup will be with Dr. Kristin Bruins.                                                Sherin Quarry, MD    SY/MEDQ  D:  12/28/2002  T:  12/29/2002  Job:  [id number]   cc:   Charlynne Pander, D.D.S.  [facility]  [address]  [place]  [place] [address]  Fax: [phone]

## 2010-10-01 ENCOUNTER — Inpatient Hospital Stay (HOSPITAL_COMMUNITY)
Admission: EM | Admit: 2010-10-01 | Discharge: 2010-10-03 | DRG: 313 | Disposition: A | Discharge status: Home / Self Care | Payer: Self-pay | Attending: Internal Medicine | Admitting: Internal Medicine

## 2010-10-01 ENCOUNTER — Emergency Department (HOSPITAL_COMMUNITY): Payer: Self-pay

## 2010-10-01 DIAGNOSIS — R0789 Other chest pain: Principal | ICD-10-CM | POA: Diagnosis present

## 2010-10-01 DIAGNOSIS — I252 Old myocardial infarction: Secondary | ICD-10-CM

## 2010-10-01 DIAGNOSIS — R112 Nausea with vomiting, unspecified: Secondary | ICD-10-CM | POA: Diagnosis present

## 2010-10-01 DIAGNOSIS — F1210 Cannabis abuse, uncomplicated: Secondary | ICD-10-CM | POA: Diagnosis present

## 2010-10-01 DIAGNOSIS — I498 Other specified cardiac arrhythmias: Secondary | ICD-10-CM | POA: Diagnosis present

## 2010-10-01 DIAGNOSIS — I2510 Atherosclerotic heart disease of native coronary artery without angina pectoris: Secondary | ICD-10-CM | POA: Diagnosis present

## 2010-10-01 DIAGNOSIS — Z91199 Patient's noncompliance with other medical treatment and regimen due to unspecified reason: Secondary | ICD-10-CM

## 2010-10-01 DIAGNOSIS — Z9119 Patient's noncompliance with other medical treatment and regimen: Secondary | ICD-10-CM

## 2010-10-01 DIAGNOSIS — F17200 Nicotine dependence, unspecified, uncomplicated: Secondary | ICD-10-CM | POA: Diagnosis present

## 2010-10-01 LAB — CBC
HCT: 37.8 — ABNORMAL LOW
HCT: 38.9 — ABNORMAL LOW
HCT: 39.5
HCT: 40.6
HCT: 45.8
HCT: 38.1 % — ABNORMAL LOW (ref 39.0–52.0)
Hemoglobin: 12.5 — ABNORMAL LOW
Hemoglobin: 13.6
Hemoglobin: 14.9
Hemoglobin: 13.3 g/dL (ref 13.0–17.0)
MCHC: 32.8
MCHC: 34.9 g/dL (ref 30.0–36.0)
MCV: 91
MCV: 91.2
MCV: 91.3
MCV: 89.2 fL (ref 78.0–100.0)
Platelets: 240
RBC: 4.28
RBC: 4.34
RBC: 4.48
RBC: 5.02
RDW: 16.3 — ABNORMAL HIGH
RDW: 15.2 % (ref 11.5–15.5)
WBC: 10.4
WBC: 6.6
WBC: 8.1

## 2010-10-01 LAB — DIFFERENTIAL
Basophils Absolute: 0
Basophils Absolute: 0 10*3/uL (ref 0.0–0.1)
Basophils Relative: 0
Eosinophils Absolute: 0.5
Eosinophils Relative: 18 — ABNORMAL HIGH
Eosinophils Relative: 5
Eosinophils Relative: 1 % (ref 0–5)
Lymphocytes Relative: 22
Lymphocytes Relative: 52 — ABNORMAL HIGH
Lymphocytes Relative: 37 % (ref 12–46)
Lymphs Abs: 3.4
Lymphs Abs: 2.3 10*3/uL (ref 0.7–4.0)
Monocytes Absolute: 0.3
Monocytes Absolute: 0.3 10*3/uL (ref 0.1–1.0)
Monocytes Relative: 4
Monocytes Relative: 5 % (ref 3–12)
Neutro Abs: 3.4 10*3/uL (ref 1.7–7.7)

## 2010-10-01 LAB — RAPID URINE DRUG SCREEN, HOSP PERFORMED
Benzodiazepines: POSITIVE — AB
Tetrahydrocannabinol: POSITIVE — AB

## 2010-10-01 LAB — POCT I-STAT CREATININE
Creatinine, Ser: 1.1
Operator id: 196461

## 2010-10-01 LAB — COMPREHENSIVE METABOLIC PANEL
ALT: 16 U/L (ref 0–53)
AST: 43 — ABNORMAL HIGH
AST: 14 U/L (ref 0–37)
CO2: 28
CO2: 29 mEq/L (ref 19–32)
Calcium: 9.6 mg/dL (ref 8.4–10.5)
Chloride: 105
Chloride: 103 mEq/L (ref 96–112)
Creatinine, Ser: 1.16
Creatinine, Ser: 1.16 mg/dL (ref 0.50–1.35)
GFR calc Af Amer: >60
GFR calc Af Amer: >60 mL/min (ref >60)
GFR calc non Af Amer: >60
GFR calc non Af Amer: >60 mL/min (ref >60)
Glucose, Bld: 97 mg/dL (ref 70–99)
Sodium: 139 mEq/L (ref 135–145)
Total Bilirubin: 0.9
Total Bilirubin: 0.3 mg/dL (ref 0.3–1.2)

## 2010-10-01 LAB — CARDIAC PANEL(CRET KIN+CKTOT+MB+TROPI)
Relative Index: 8.2 — ABNORMAL HIGH
Total CK: 123
Troponin I: 1.16
Troponin I: 1.57
Troponin I: 2.64

## 2010-10-01 LAB — SAMPLE TO BLOOD BANK

## 2010-10-01 LAB — BASIC METABOLIC PANEL
BUN: 2 — ABNORMAL LOW
CO2: 26
Calcium: 9
Chloride: 107
Creatinine, Ser: 1.02
Creatinine, Ser: 1.08
GFR calc Af Amer: >60
GFR calc Af Amer: >60
GFR calc non Af Amer: >60
GFR calc non Af Amer: >60
Glucose, Bld: 111 — ABNORMAL HIGH
Potassium: 4
Potassium: 4
Sodium: 138

## 2010-10-01 LAB — LIPID PANEL
Cholesterol: 217 — ABNORMAL HIGH
Cholesterol: 268 — ABNORMAL HIGH
HDL: 29 — ABNORMAL LOW
HDL: 33 — ABNORMAL LOW
HDL: 39 — ABNORMAL LOW
LDL Cholesterol: 188 — ABNORMAL HIGH
Total CHOL/HDL Ratio: 6.9
Total CHOL/HDL Ratio: 7.1
Total CHOL/HDL Ratio: 7.5
Triglycerides: 67
VLDL: 13
VLDL: 14
VLDL: 15

## 2010-10-01 LAB — D-DIMER, QUANTITATIVE: D-Dimer, Quant: 0.29

## 2010-10-01 LAB — HEMOGLOBIN A1C: Hgb A1c MFr Bld: 5.8

## 2010-10-01 LAB — I-STAT 8, (EC8 V) (CONVERTED LAB)
Acid-base deficit: 3 — ABNORMAL HIGH
Bicarbonate: 19.6 — ABNORMAL LOW
HCT: 50
Operator id: 196461
TCO2: 20
pCO2, Ven: 27.9 — ABNORMAL LOW

## 2010-10-01 LAB — POCT I-STAT TROPONIN I

## 2010-10-01 LAB — POCT CARDIAC MARKERS: Myoglobin, poc: 150

## 2010-10-01 LAB — PROTIME-INR: INR: 1.1

## 2010-10-02 LAB — CK TOTAL AND CKMB (NOT AT ARMC)
CK, MB: 1.5 ng/mL (ref 0.3–4.0)
Relative Index: INVALID (ref 0.0–2.5)
Total CK: 66 U/L (ref 7–232)

## 2010-10-02 LAB — TROPONIN I: Troponin I: <0.3 ng/mL (ref <0.30)

## 2010-10-02 LAB — CBC
MCV: 89.5 fL (ref 78.0–100.0)
Platelets: 217 10*3/uL (ref 150–400)
RDW: 15.4 % (ref 11.5–15.5)
WBC: 5.5 10*3/uL (ref 4.0–10.5)

## 2010-10-02 LAB — CARDIAC PANEL(CRET KIN+CKTOT+MB+TROPI)
Relative Index: INVALID (ref 0.0–2.5)
Total CK: 59 U/L (ref 7–232)
Troponin I: <0.3 ng/mL (ref <0.30)
Troponin I: <0.3 ng/mL (ref <0.30)
Troponin I: <0.3 ng/mL (ref <0.30)

## 2010-10-02 LAB — LIPID PANEL
Cholesterol: 256 mg/dL — ABNORMAL HIGH (ref 0–200)
LDL Cholesterol: 208 mg/dL — ABNORMAL HIGH (ref 0–99)
Triglycerides: 75 mg/dL (ref <150)

## 2010-10-02 LAB — DIFFERENTIAL
Basophils Absolute: 0 10*3/uL (ref 0.0–0.1)
Basophils Relative: 0 % (ref 0–1)
Eosinophils Absolute: 0.1 10*3/uL (ref 0.0–0.7)
Eosinophils Relative: 2 % (ref 0–5)
Neutrophils Relative %: 37 % — ABNORMAL LOW (ref 43–77)

## 2010-10-02 LAB — HEPARIN LEVEL (UNFRACTIONATED): Heparin Unfractionated: 0.35 IU/mL (ref 0.30–0.70)

## 2010-10-02 LAB — URINALYSIS, ROUTINE W REFLEX MICROSCOPIC
Glucose, UA: NEGATIVE mg/dL
Hgb urine dipstick: NEGATIVE
Leukocytes, UA: NEGATIVE
pH: 7 (ref 5.0–8.0)

## 2010-10-03 ENCOUNTER — Inpatient Hospital Stay (HOSPITAL_COMMUNITY): Payer: Self-pay

## 2010-10-03 LAB — CBC
MCH: 30.5 pg (ref 26.0–34.0)
MCHC: 34 g/dL (ref 30.0–36.0)
MCV: 89.7 fL (ref 78.0–100.0)
Platelets: 231 10*3/uL (ref 150–400)
RBC: 4.19 MIL/uL — ABNORMAL LOW (ref 4.22–5.81)
RDW: 15 % (ref 11.5–15.5)

## 2010-10-03 LAB — DIFFERENTIAL
Basophils Relative: 0 % (ref 0–1)
Eosinophils Absolute: 0.1 10*3/uL (ref 0.0–0.7)
Eosinophils Relative: 2 % (ref 0–5)
Lymphocytes Relative: 29 % (ref 12–46)
Monocytes Relative: 8 % (ref 3–12)
Neutro Abs: 2.9 10*3/uL (ref 1.7–7.7)

## 2010-10-03 LAB — HEPARIN LEVEL (UNFRACTIONATED): Heparin Unfractionated: 0.15 IU/mL — ABNORMAL LOW (ref 0.30–0.70)

## 2010-10-03 MED ORDER — TECHNETIUM TC 99M TETROFOSMIN IV KIT
30.0000 | PACK | Freq: Once | INTRAVENOUS | Status: AC | PRN
  Administered 2010-10-03: 30 via INTRAVENOUS

## 2010-10-03 MED ORDER — TECHNETIUM TC 99M TETROFOSMIN IV KIT
10.0000 | PACK | Freq: Once | INTRAVENOUS | Status: AC | PRN
  Administered 2010-10-03: 10 via INTRAVENOUS

## 2010-10-04 NOTE — Discharge Summary (Signed)
[name], [name] NO.:  [id number]  MEDICAL RECORD NO.:  [id number]  LOCATION:  3735                         FACILITY:  [facility]  PHYSICIAN:  Italy Hilty, MD         DATE OF BIRTH:  Jul 08, 1975  DATE OF ADMISSION:  10/01/2010 DATE OF DISCHARGE:  10/03/2010                              DISCHARGE SUMMARY   DISCHARGE DIAGNOSES: 1. Chest pain secondary to gastrointestinal.     a.     Negative myocardial infarction.     b.     Negative Myoview stress test with good performance. 2. Resolved chest pain currently. 3. Coronary artery disease with a history of stent to the left     anterior descending, last cath was in 2011.  Ejection fraction at     that time was 20-30% but on Myoview stress test today Ejection     fraction is 60%. 4. Noncompliance. 5. Sinus bradycardia.  DISCHARGE CONDITION:  Improved.  PROCEDURES:  None.  DISCHARGE MEDICATIONS: 1. We added just baby aspirin a day as he does have a stent to the     LAD. 2. We also added Prilosec 20 mg daily. 3. We have encouraged him to stop using tobacco and recommended     nicotine patch, prescription was written and would was also     instructed him not to smoke any of patch cause  because that could     cause a heart attack, but certainly alcohol use and tobacco use are     aggravating his GI symptoms as well.  DISCHARGE INSTRUCTIONS: 1. May return to work on October 05, 2010. 2. Increase activity slowly. 3. We will schedule appointment with gastroenterologist. 4. Stop alcohol. 5. Stop tobacco. 6. Follow up with Dr. Rennis Golden at [facility], the     office will call with date and time.  HOSPITAL COURSE:  The patient was admitted on October 01, 2010 secondary to chest pain and he has a history of LAD stent in the remote past, last cath in 2011.  EF was 20-30% and stent was patent, on followup echo EF was 40%.  Prior to the ER, on the 20th he stated he had had 2 weeks of nausea and  vomiting and 20 minutes prior to arrival he was walking to a store became nauseated, felt lightheaded and developed substernal chest pain with radiation to his left arm.  EMS was called. He was given a total of five sublingual nitro with complete resolution of the chest pain after the fifth one, he was then pain free since that time.  ALLERGIES:  SHELLFISH allergy which causes hives and anaphylaxis.   The patient was admitted, placed IV heparin, nitroglycerin paste, tobacco cessation was consulted and discussed with him on nicotine patch.  He actually states this is a good medicine for him.  He felt much better on the nicotine.  During the hospitalization, he has been bradycardic in the 50s which is not unusual for him and therefore not a candidate for beta blockers.  On October 03, 2010, he underwent a stress Myoview, completed approximately 11 minutes, actually 10.46 minutes with heart rate up to 157.  Approximately 84% of predicted max, he was hypertensive with stress test, but his blood pressure came down at a steady rate after irrecoverably.  The patient did not have any chest pain or shortness of breath other than would be expected.  In recovery, he had no chest pain.  Nuclear stress test revealed no fixed or reversible defects to suggest inducible ischemia, normal LV wall motion and thickening normal EF at 60%.  Chest x-ray on admission, no evidence of active cardiopulmonary disease.  LABORATORY DATA:  Hemoglobin 12.8, hematocrit 37.6, WBC 4.7, platelets 231, protime 14.4, INR 1.10, sodium 139, potassium 4.2, chloride 103, CO2 of 29, glucose 97, BUN 6, creatinine 1.16, total bili 0.3, alkaline phos 68, SGOT 14, SGPT 16, total protein 7.1, albumin 3.8, calcium 9.6, lipase 22.  Cardiac enzymes negative 61, 59, 64 with MBs 1.6-1.5. Troponin I less than 0.30.  Cholesterol, total cholesterol 256, triglycerides 75, HDL 33, LDL 208. UA was clear.  EKG reveals sinus bradycardia  and sinus arrhythmia, has some early repolarization as well.  Please note, the patient admits to marijuana use and alcohol, but denies ever using cocaine, no drug screens in the computer have revealed cocaine.  The patient is discharged home with followup as previously instructed.     Darcella Gasman. Annie Paras, N.P.   ______________________________ [place] Hilty, MD    LRI/MEDQ  D:  10/03/2010  T:  10/03/2010  Job:  [id number]  cc:   Junie Bame  Electronically Signed by Nada Boozer N.P. on 10/04/2010 11:44:36 AM Electronically Signed by Kirtland Bouchard. HILTY M.D. on 10/04/2010 03:11:36 PM

## 2010-10-10 NOTE — H&P (Signed)
NAMEHENDRIX, [name] NO.:  [id number]  MEDICAL RECORD NO.:  [id number]  LOCATION:  [facility]                         FACILITY:  [facility]  PHYSICIAN:  Armanda Magic, M.D.     DATE OF BIRTH:  05-Sep-1975  DATE OF ADMISSION:  10/01/2010 DATE OF DISCHARGE:                             HISTORY & PHYSICAL   REFERRING PHYSICIAN:  Celene Kras, MD  PRIMARY CARDIOLOGIST:  He sees a Dentist.  He is not sure which one.  CHIEF COMPLAINT:  Chest pain.  HISTORY OF PRESENT ILLNESS:  This is a 35 year old black male with a history of coronary disease who presented to the emergency room with complaints of chest pain.  He states for the past 2 weeks, he has had nausea and vomiting and today 20 minutes prior to arrival in the ER, he was walking to the store and became nauseated and felt lightheaded, and developed substernal chest pain with radiation to left arm.  EMS was called and he was given a total of 5 sublingual nitroglycerins with complete resolution of his chest pain after the fifth one.  He is currently pain free.  He has been noncompliant with followup with a cardiologist at [facility] and the last note in the records is from January 2011.  PAST MEDICAL HISTORY:  Coronary disease status post remote stent in the past.  PAST SURGICAL HISTORY:  None.  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  SOCIAL HISTORY:  He smokes one pack of cigarettes every 2 days.  He occasionally drinks alcohol.  He occasionally uses marijuana.  He denies any cocaine.  MEDICATIONS:  None.  REVIEW OF SYSTEMS:  Other than what is stated in HPI is negative.  FAMILY HISTORY:  His mother has coronary disease.  His father is a strange.  PHYSICAL EXAMINATION:  VITAL SIGNS:  Blood pressure 118/75, heart rate 64, O2 saturations 100% on room air. GENERAL:  This is a well-developed, well-nourished black male in no acute  distress. HEENT:  Benign. NECK:  Supple without lymphadenopathy.  Carotid upstrokes are +2 bilaterally.  No bruits. LUNGS:  Clear to auscultation throughout. HEART:  Regular rate and rhythm.  No murmurs, rubs, or gallops.  Normal S1 and S2. ABDOMEN:  Soft and nondistended.  There is tenderness to palpation over the umbilicus area. Normoactive bowel sounds.  No hepatosplenomegaly. EXTREMITIES:  No cyanosis, erythema, or edema.  LABORATORY DATA:  Sodium 139, potassium 4.2, chloride 103, bicarb 29, BUN 6, creatinine 1.16.  White cell count 6.1, hemoglobin 13.3, hematocrit 38.1, platelet count 331.  LFTs normal. Troponin is 0.  EKG shows sinus bradycardia with RSR, V1.  Chest x-ray shows no active disease.  ASSESSMENT: 1. Sinus bradycardia. 2. Chest pain with no ischemia on EKG and negative cardiac enzymes x1. 3. Tobacco abuse ongoing.  PLAN:  Admit to telemetry bed.  Cycle cardiac enzymes.  IV heparin drip per pharmacy.  Nitro paste 1 inch anterior chest wall q.6 h with a nitrate-free period for 6 hours, aspirin 325 mg daily.  We will make n.p.o. after midnight for possible stress Cardiolite in the morning if enzymes are all  negative.     Armanda Magic, M.D.     TT/MEDQ  D:  10/01/2010  T:  10/02/2010  Job:  [id number]  cc:   Luis Abed, MD, [facility]  Electronically Signed by Armanda Magic M.D. on 10/10/2010 04:51:27 PM

## 2011-01-29 ENCOUNTER — Emergency Department (HOSPITAL_COMMUNITY)
Admission: EM | Admit: 2011-01-29 | Discharge: 2011-01-29 | Disposition: A | Discharge status: Home / Self Care | Payer: Self-pay | Attending: Emergency Medicine | Admitting: Emergency Medicine

## 2011-01-29 ENCOUNTER — Encounter (HOSPITAL_COMMUNITY): Payer: Self-pay | Admitting: *Deleted

## 2011-01-29 DIAGNOSIS — K089 Disorder of teeth and supporting structures, unspecified: Secondary | ICD-10-CM | POA: Insufficient documentation

## 2011-01-29 DIAGNOSIS — K029 Dental caries, unspecified: Secondary | ICD-10-CM | POA: Insufficient documentation

## 2011-01-29 DIAGNOSIS — Z79899 Other long term (current) drug therapy: Secondary | ICD-10-CM | POA: Insufficient documentation

## 2011-01-29 DIAGNOSIS — Z7982 Long term (current) use of aspirin: Secondary | ICD-10-CM | POA: Insufficient documentation

## 2011-01-29 DIAGNOSIS — K0889 Other specified disorders of teeth and supporting structures: Secondary | ICD-10-CM

## 2011-01-29 DIAGNOSIS — I252 Old myocardial infarction: Secondary | ICD-10-CM | POA: Insufficient documentation

## 2011-01-29 MED ORDER — PENICILLIN V POTASSIUM 500 MG PO TABS: 500.0000 mg | ORAL_TABLET | Freq: Four times a day (QID) | ORAL | Status: AC

## 2011-01-29 MED ORDER — IBUPROFEN 200 MG PO TABS
400.0000 mg | ORAL_TABLET | Freq: Once | ORAL | Status: AC
  Administered 2011-01-29: 400 mg via ORAL
  Filled 2011-01-29: qty 2

## 2011-01-29 MED ORDER — OXYCODONE-ACETAMINOPHEN 5-325 MG PO TABS: 1.0000 | ORAL_TABLET | ORAL | Status: AC | PRN

## 2011-01-29 MED ORDER — CHLORHEXIDINE GLUCONATE 0.12 % MT SOLN: 5.0000 mL | Freq: Two times a day (BID) | OROMUCOSAL | Status: AC
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
## 2011-01-29 NOTE — ED Notes (Signed)
Patient reports having a dental abscess. States that he saw his dentist yesterday. Patient states that he came to the ED for pain medicine. Patient states that his dentist told him to come to the ED for a prescription if he could not stand the pain.

## 2011-07-12 ENCOUNTER — Emergency Department (HOSPITAL_COMMUNITY)
Admission: EM | Admit: 2011-07-12 | Discharge: 2011-07-13 | Disposition: A | Discharge status: Other Acute Inpatient Hospital | Payer: Self-pay | Attending: Emergency Medicine | Admitting: Emergency Medicine

## 2011-07-12 DIAGNOSIS — F419 Anxiety disorder, unspecified: Secondary | ICD-10-CM

## 2011-07-12 DIAGNOSIS — R45851 Suicidal ideations: Secondary | ICD-10-CM | POA: Insufficient documentation

## 2011-07-12 DIAGNOSIS — F411 Generalized anxiety disorder: Secondary | ICD-10-CM | POA: Insufficient documentation

## 2011-07-12 DIAGNOSIS — F329 Major depressive disorder, single episode, unspecified: Secondary | ICD-10-CM | POA: Insufficient documentation

## 2011-07-12 DIAGNOSIS — G479 Sleep disorder, unspecified: Secondary | ICD-10-CM | POA: Insufficient documentation

## 2011-07-12 DIAGNOSIS — F3289 Other specified depressive episodes: Secondary | ICD-10-CM | POA: Insufficient documentation

## 2011-07-12 DIAGNOSIS — R630 Anorexia: Secondary | ICD-10-CM | POA: Insufficient documentation

## 2011-07-12 DIAGNOSIS — Z7982 Long term (current) use of aspirin: Secondary | ICD-10-CM | POA: Insufficient documentation

## 2011-07-12 LAB — URINALYSIS, ROUTINE W REFLEX MICROSCOPIC
Leukocytes, UA: NEGATIVE
Nitrite: NEGATIVE
Protein, ur: NEGATIVE mg/dL
Specific Gravity, Urine: 1.029 (ref 1.005–1.030)
Urobilinogen, UA: 1 mg/dL (ref 0.0–1.0)

## 2011-07-12 LAB — CBC WITH DIFFERENTIAL/PLATELET
Basophils Absolute: 0 10*3/uL (ref 0.0–0.1)
Lymphocytes Relative: 45 % (ref 12–46)
Lymphs Abs: 2.9 10*3/uL (ref 0.7–4.0)
Neutrophils Relative %: 48 % (ref 43–77)
Platelets: 264 10*3/uL (ref 150–400)
RBC: 4.62 MIL/uL (ref 4.22–5.81)
RDW: 14.2 % (ref 11.5–15.5)
WBC: 6.5 10*3/uL (ref 4.0–10.5)

## 2011-07-12 LAB — BASIC METABOLIC PANEL
GFR calc Af Amer: >90 mL/min (ref >90)
GFR calc non Af Amer: >90 mL/min (ref >90)
Glucose, Bld: 92 mg/dL (ref 70–99)
Potassium: 4.1 mEq/L (ref 3.5–5.1)
Sodium: 138 mEq/L (ref 135–145)

## 2011-07-12 NOTE — ED Notes (Signed)
Patient from [facility] for medical clearence

## 2011-07-12 NOTE — Discharge Instructions (Signed)
Transfer back to [facility] via their security personnel for further psychiatric evaluation and placement.

## 2011-07-12 NOTE — ED Provider Notes (Signed)
History     CSN: [id number]  Arrival date & time 07/12/11  2132   First MD Initiated Contact with Patient 07/12/11 2218     Chief complaint: depression  (Consider location/radiation/quality/duration/timing/severity/associated sxs/prior treatment) The history is provided by the patient.  pt with hix 'mood swings' states in past couple weeks is having increased mood swings, worsening depression, thoughts of getting fights, thoughts of suicide. Patient denies attempt to harm self, denies overdose or injury. He went to [facility] and was sent here for labs. Pt states also is having anxiety, trouble sleeping at night, decreased appetite. No nvd. No abd pain. No fever/chills. No headaches. States periodic thc use, but denies other drug use. States occasional etoh use, denies daily or recent etoh abuse.     Past Medical History  Diagnosis Date  . MI, old     No past surgical history on file.  No family history on file.  History  Substance Use Topics  . Smoking status: Not on file  . Smokeless tobacco: Not on file  . Alcohol Use: Yes      Review of Systems  Constitutional: Negative for fever.  HENT: Negative for neck pain.   Eyes: Negative for visual disturbance.  Respiratory: Negative for shortness of breath.   Cardiovascular: Negative for chest pain.  Gastrointestinal: Negative for abdominal pain.  Genitourinary: Negative for flank pain.  Musculoskeletal: Negative for back pain.  Neurological: Negative for weakness, numbness and headaches.  Hematological: Does not bruise/bleed easily.  Psychiatric/Behavioral: Positive for dysphoric mood.    Allergies  Shrimp  Home Medications   Current Outpatient Rx  Name Route Sig Dispense Refill  . ASPIRIN EC 81 MG PO TBEC Oral Take 81 mg by mouth daily.    Marland Kitchen NITROGLYCERIN 0.4 MG SL SUBL Sublingual Place 0.4 mg under the tongue every 5 (five) minutes as needed. For chest pain      BP 158/94  Pulse 43  Temp 98.5 F (36.9 C) (Oral)   Resp 18  SpO2 100%  Physical Exam  Nursing note and vitals reviewed. Constitutional: He is oriented to person, place, and time. He appears well-developed and well-nourished. No distress.  HENT:  Head: Atraumatic.  Nose: Nose normal.  Mouth/Throat: Oropharynx is clear and moist.  Eyes: Conjunctivae are normal. Pupils are equal, round, and reactive to light. No scleral icterus.  Neck: Normal range of motion. Neck supple. No tracheal deviation present.  Cardiovascular: Normal rate, regular rhythm, normal heart sounds and intact distal pulses.   Pulmonary/Chest: Effort normal and breath sounds normal. No accessory muscle usage. No respiratory distress.  Abdominal: Soft. He exhibits no distension. There is no tenderness.  Musculoskeletal: Normal range of motion. He exhibits no edema and no tenderness.  Neurological: He is alert and oriented to person, place, and time.       Motor intact bil. Steady gait.   Skin: Skin is warm and dry. He is not diaphoretic.  Psychiatric:       Anxious. +suicidal thoughts.     ED Course  Procedures (including critical care time)   Labs Reviewed  URINE RAPID DRUG SCREEN (HOSP PERFORMED)  BASIC METABOLIC PANEL  URINALYSIS, ROUTINE W REFLEX MICROSCOPIC  CBC WITH DIFFERENTIAL      MDM  Labs. Security from [place] with patient, they state once clear/labs done, they plan to return pt to [place].   Date: 07/12/2011  Rate: 47  Rhythm: sinus bradycardia  QRS Axis: normal  Intervals: normal  ST/T Wave abnormalities: normal  Conduction Disutrbances:none  Narrative Interpretation:   Old EKG Reviewed: unchanged  ecg unchanged from ecg sept 2012.            Suzi Roots, MD 07/14/11 [phone]

## 2011-07-13 LAB — RAPID URINE DRUG SCREEN, HOSP PERFORMED
Amphetamines: NOT DETECTED
Barbiturates: NOT DETECTED
Tetrahydrocannabinol: POSITIVE — AB
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
## 2011-11-29 ENCOUNTER — Emergency Department (HOSPITAL_COMMUNITY)
Admission: EM | Admit: 2011-11-29 | Discharge: 2011-11-29 | Disposition: A | Discharge status: Home / Self Care | Payer: Self-pay | Attending: Emergency Medicine | Admitting: Emergency Medicine

## 2011-11-29 DIAGNOSIS — K089 Disorder of teeth and supporting structures, unspecified: Secondary | ICD-10-CM | POA: Insufficient documentation

## 2011-11-29 DIAGNOSIS — Z7982 Long term (current) use of aspirin: Secondary | ICD-10-CM | POA: Insufficient documentation

## 2011-11-29 DIAGNOSIS — K0889 Other specified disorders of teeth and supporting structures: Secondary | ICD-10-CM

## 2011-11-29 DIAGNOSIS — I252 Old myocardial infarction: Secondary | ICD-10-CM | POA: Insufficient documentation

## 2011-11-29 DIAGNOSIS — Z79899 Other long term (current) drug therapy: Secondary | ICD-10-CM | POA: Insufficient documentation

## 2011-11-29 MED ORDER — OXYCODONE-ACETAMINOPHEN 5-325 MG PO TABS
2.0000 | ORAL_TABLET | Freq: Once | ORAL | Status: AC
  Administered 2011-11-29: 2 via ORAL
  Filled 2011-11-29: qty 2

## 2011-11-29 MED ORDER — HYDROCODONE-ACETAMINOPHEN 5-325 MG PO TABS: 2.0000 | ORAL_TABLET | ORAL | Status: DC | PRN

## 2011-11-29 MED ORDER — PENICILLIN V POTASSIUM 500 MG PO TABS: 500.0000 mg | ORAL_TABLET | Freq: Four times a day (QID) | ORAL | Status: DC
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
## 2011-11-29 NOTE — ED Provider Notes (Signed)
Medical screening examination/treatment/procedure(s) were performed by non-physician practitioner and as supervising physician I was immediately available for consultation/collaboration.  Jones Skene, M.D.     Jones Skene, MD 11/29/11 2156

## 2011-12-01 ENCOUNTER — Encounter (HOSPITAL_COMMUNITY): Payer: Self-pay | Admitting: *Deleted

## 2011-12-01 ENCOUNTER — Emergency Department (INDEPENDENT_AMBULATORY_CARE_PROVIDER_SITE_OTHER)
Admission: EM | Admit: 2011-12-01 | Discharge: 2011-12-01 | Disposition: A | Discharge status: Home / Self Care | Payer: Self-pay | Source: Home / Self Care | Attending: Family Medicine | Admitting: Family Medicine

## 2011-12-01 DIAGNOSIS — L309 Dermatitis, unspecified: Secondary | ICD-10-CM

## 2011-12-01 DIAGNOSIS — L259 Unspecified contact dermatitis, unspecified cause: Secondary | ICD-10-CM

## 2011-12-01 MED ORDER — PERMETHRIN 5 % EX CREA: TOPICAL_CREAM | CUTANEOUS | Status: DC

## 2011-12-01 MED ORDER — TRIAMCINOLONE ACETONIDE 0.5 % EX OINT: TOPICAL_OINTMENT | Freq: Two times a day (BID) | CUTANEOUS | Status: DC

## 2011-12-01 MED ORDER — TRIAMCINOLONE 0.1 % CREAM:EUCERIN CREAM 1:1: 1.0000 "application " | TOPICAL_CREAM | Freq: Two times a day (BID) | CUTANEOUS | Status: DC

## 2011-12-01 NOTE — ED Notes (Signed)
Pt reports rash all over body and face since Thursday - believes that it is scabies

## 2011-12-01 NOTE — ED Provider Notes (Signed)
History     CSN: [id number]  Arrival date & time 12/01/11  0820   First MD Initiated Contact with Patient 12/01/11 [phone]      Chief Complaint  Patient presents with  . Rash    (Consider location/radiation/quality/duration/timing/severity/associated sxs/prior treatment) HPI Comments: 36 year old male smoker with history of mood disorder here complaining of dry scaly rash all over his body but worse in his face for about a week. Patient is concerned as he has had scabies in the past and thinks this rash could be a recurrence. No prior history of eczema. Not using any medication for his symptoms. Denies fever or chills. Denies general malaise. Patient currently taking penicillin due to a dental infection. No general malaise. No difficulty breathing or sore throat.   Past Medical History  Diagnosis Date  . MI, old     History reviewed. No pertinent past surgical history.  Family History  Problem Relation Age of Onset  . Family history unknown: Yes    History  Substance Use Topics  . Smoking status: Current Every Day Smoker -- 0.5 packs/day    Types: Cigarettes  . Smokeless tobacco: Not on file  . Alcohol Use: Yes      Review of Systems  Constitutional: Negative for fever, chills, activity change, appetite change and fatigue.  HENT: Negative for congestion, rhinorrhea and sneezing.   Eyes: Negative for discharge and itching.  Respiratory: Negative for cough and wheezing.   Skin: Positive for rash.  Psychiatric/Behavioral: The patient is nervous/anxious.     Allergies  Shrimp  Home Medications   Current Outpatient Rx  Name  Route  Sig  Dispense  Refill  . ARIPIPRAZOLE 15 MG PO TABS   Oral   Take 15 mg by mouth 2 (two) times daily.         . ASPIRIN EC 81 MG PO TBEC   Oral   Take 81 mg by mouth daily.         Marland Kitchen CARBAMAZEPINE 200 MG PO TABS   Oral   Take 800 mg by mouth daily.         Marland Kitchen PENICILLIN V POTASSIUM 500 MG PO TABS   Oral   Take 1 tablet  (500 mg total) by mouth 4 (four) times daily.   40 tablet   0   . HYDROCODONE-ACETAMINOPHEN 5-325 MG PO TABS   Oral   Take 2 tablets by mouth every 4 (four) hours as needed for pain.   13 tablet   0   . NITROGLYCERIN 0.4 MG SL SUBL   Sublingual   Place 0.4 mg under the tongue every 5 (five) minutes as needed. For chest pain         . PERMETHRIN 5 % EX CREA      Apply to affected area once can repeat in 10 days.   10 g   1   . TRIAMCINOLONE 0.1 % CREAM:EUCERIN CREAM 1:1   Topical   Apply 1 application topically 2 (two) times daily. Triamcinolone 0.1% cream compounded with Eucerin cream 1:1. Apply as directed. Dispense 450 g   1 each   0   . TRIAMCINOLONE ACETONIDE 0.5 % EX OINT   Topical   Apply topically 2 (two) times daily.   30 g   0     BP 107/82  Pulse 73  Temp 97.5 F (36.4 C) (Oral)  Resp 18  SpO2 96%  Physical Exam  Nursing note and vitals reviewed. Constitutional: He is oriented  to person, place, and time. He appears well-developed and well-nourished. No distress.  HENT:  Head: Normocephalic and atraumatic.  Nose: Nose normal.  Mouth/Throat: Oropharynx is clear and moist. No oropharyngeal exudate.  Eyes: Conjunctivae normal are normal. No scleral icterus.  Neck: Neck supple. No thyromegaly present.  Cardiovascular: Normal heart sounds.   Pulmonary/Chest: Breath sounds normal.  Lymphadenopathy:    He has no cervical adenopathy.  Neurological: He is alert and oriented to person, place, and time.  Skin: He is not diaphoretic.       Generalized dry skin with peeling worse in nose/malar area in face. Similar rash in fore arms and exposed area but milder in torso proximal extremities and not exposed areas. No hyperpigmentation. No ulcers papules or skin brakes.     ED Course  Procedures (including critical care time)  Labs Reviewed - No data to display No results found.   1. Eczema       MDM  Does not impress scabies. Impress eczema mild to  moderate. Prescribed triamcinolone 0.5% ointment for worse areas. Encouraged skin lubrication and prescribed trazodone 0.1% cream combined with Eucerin cream for maintenance as needed. Asked to followup with his primary care provider or return if not improving or worsening symptoms despite following treatment. As per patient's request a hold prescription for permethrin was given in case symptoms do not improve with eczema treatment.      Sharin Grave, MD 12/03/11 1340

## 2012-01-28 ENCOUNTER — Emergency Department (HOSPITAL_COMMUNITY)
Admission: EM | Admit: 2012-01-28 | Discharge: 2012-01-28 | Disposition: A | Discharge status: Home / Self Care | Payer: Self-pay | Attending: Emergency Medicine | Admitting: Emergency Medicine

## 2012-01-28 ENCOUNTER — Encounter (HOSPITAL_COMMUNITY): Payer: Self-pay | Admitting: Emergency Medicine

## 2012-01-28 DIAGNOSIS — K5289 Other specified noninfective gastroenteritis and colitis: Secondary | ICD-10-CM | POA: Insufficient documentation

## 2012-01-28 DIAGNOSIS — K529 Noninfective gastroenteritis and colitis, unspecified: Secondary | ICD-10-CM

## 2012-01-28 DIAGNOSIS — F319 Bipolar disorder, unspecified: Secondary | ICD-10-CM | POA: Insufficient documentation

## 2012-01-28 DIAGNOSIS — Z7982 Long term (current) use of aspirin: Secondary | ICD-10-CM | POA: Insufficient documentation

## 2012-01-28 DIAGNOSIS — F17200 Nicotine dependence, unspecified, uncomplicated: Secondary | ICD-10-CM | POA: Insufficient documentation

## 2012-01-28 DIAGNOSIS — I252 Old myocardial infarction: Secondary | ICD-10-CM | POA: Insufficient documentation

## 2012-01-28 DIAGNOSIS — R1110 Vomiting, unspecified: Secondary | ICD-10-CM

## 2012-01-28 HISTORY — DX: Bipolar disorder, unspecified: F31.9

## 2012-01-28 LAB — COMPREHENSIVE METABOLIC PANEL
ALT: 12 U/L (ref 0–53)
AST: 15 U/L (ref 0–37)
Alkaline Phosphatase: 97 U/L (ref 39–117)
CO2: 28 mEq/L (ref 19–32)
Calcium: 10.7 mg/dL — ABNORMAL HIGH (ref 8.4–10.5)
Chloride: 93 mEq/L — ABNORMAL LOW (ref 96–112)
GFR calc Af Amer: 89 mL/min — ABNORMAL LOW (ref >90)
GFR calc non Af Amer: 77 mL/min — ABNORMAL LOW (ref >90)
Glucose, Bld: 105 mg/dL — ABNORMAL HIGH (ref 70–99)
Sodium: 136 mEq/L (ref 135–145)
Total Bilirubin: 0.8 mg/dL (ref 0.3–1.2)

## 2012-01-28 LAB — CBC WITH DIFFERENTIAL/PLATELET
Basophils Absolute: 0 10*3/uL (ref 0.0–0.1)
Eosinophils Relative: 0 % (ref 0–5)
HCT: 48.7 % (ref 39.0–52.0)
Lymphocytes Relative: 33 % (ref 12–46)
Lymphs Abs: 1.9 10*3/uL (ref 0.7–4.0)
MCV: 90 fL (ref 78.0–100.0)
Neutro Abs: 3.2 10*3/uL (ref 1.7–7.7)
Platelets: 266 10*3/uL (ref 150–400)
RBC: 5.41 MIL/uL (ref 4.22–5.81)
RDW: 15.6 % — ABNORMAL HIGH (ref 11.5–15.5)
WBC: 5.6 10*3/uL (ref 4.0–10.5)

## 2012-01-28 MED ORDER — ONDANSETRON HCL 4 MG/2ML IJ SOLN
4.0000 mg | Freq: Once | INTRAMUSCULAR | Status: AC
  Administered 2012-01-28: 4 mg via INTRAVENOUS
  Filled 2012-01-28: qty 2

## 2012-01-28 MED ORDER — ONDANSETRON HCL 8 MG PO TABS: 8.0000 mg | ORAL_TABLET | ORAL | Status: DC | PRN

## 2012-01-28 MED ORDER — KETOROLAC TROMETHAMINE 30 MG/ML IJ SOLN
30.0000 mg | Freq: Once | INTRAMUSCULAR | Status: AC
  Administered 2012-01-28: 30 mg via INTRAVENOUS
  Filled 2012-01-28: qty 1

## 2012-01-28 MED ORDER — SODIUM CHLORIDE 0.9 % IV BOLUS (SEPSIS)
1000.0000 mL | Freq: Once | INTRAVENOUS | Status: AC
  Administered 2012-01-28: 1000 mL via INTRAVENOUS

## 2012-01-28 MED ORDER — ONDANSETRON 8 MG PO TBDP
8.0000 mg | ORAL_TABLET | Freq: Once | ORAL | Status: AC
  Administered 2012-01-28: 8 mg via ORAL
  Filled 2012-01-28: qty 1

## 2012-01-28 NOTE — ED Provider Notes (Signed)
History     CSN: [id number]  Arrival date & time 01/28/12  2703   First MD Initiated Contact with Patient 01/28/12 2130      Chief Complaint  Patient presents with  . Nausea  . Emesis    (Consider location/radiation/quality/duration/timing/severity/associated sxs/prior treatment) HPI Comments: Patient had 10 teeth removed 4 days ago.  States he has been vomiting continuously since that time.  No abd pain, fevers, or chills.  No ill contacts.  Patient is a 37 y.o. male presenting with vomiting. The history is provided by the patient.  Emesis  This is a new problem. Episode onset: 4 days ago. The problem occurs continuously. The problem has not changed since onset.There has been no fever. Pertinent negatives include no chills, no diarrhea and no fever.    Past Medical History  Diagnosis Date  . MI, old   . Bipolar 1 disorder     History reviewed. No pertinent past surgical history.  History reviewed. No pertinent family history.  History  Substance Use Topics  . Smoking status: Current Every Day Smoker -- 0.5 packs/day    Types: Cigarettes  . Smokeless tobacco: Not on file  . Alcohol Use: Yes      Review of Systems  Constitutional: Negative for fever and chills.  Gastrointestinal: Positive for vomiting. Negative for diarrhea.  All other systems reviewed and are negative.    Allergies  Shrimp  Home Medications   Current Outpatient Rx  Name  Route  Sig  Dispense  Refill  . ARIPIPRAZOLE 10 MG PO TABS   Oral   Take 10 mg by mouth daily.         . ASPIRIN EC 81 MG PO TBEC   Oral   Take 81 mg by mouth daily.         Marland Kitchen CARBAMAZEPINE 200 MG PO TABS   Oral   Take 200-400 mg by mouth daily. 1 tab in the am and 2 at night         . PENICILLIN V POTASSIUM 500 MG PO TABS   Oral   Take 500 mg by mouth 4 (four) times daily.           BP 128/88  Pulse 69  Temp 98.6 F (37 C) (Oral)  Resp 19  SpO2 100%  Physical Exam  Nursing note and vitals  reviewed. Constitutional: He is oriented to person, place, and time. He appears well-developed and well-nourished. No distress.  HENT:  Head: Normocephalic and atraumatic.  Mouth/Throat: Oropharynx is clear and moist. No oropharyngeal exudate.  Neck: Normal range of motion. Neck supple.  Cardiovascular: Normal rate and regular rhythm.   No murmur heard. Pulmonary/Chest: Effort normal and breath sounds normal. No respiratory distress.  Abdominal: Soft. Bowel sounds are normal. He exhibits no distension. There is no tenderness.  Musculoskeletal: Normal range of motion. He exhibits no edema.  Lymphadenopathy:    He has no cervical adenopathy.  Neurological: He is alert and oriented to person, place, and time.  Skin: Skin is warm and dry. He is not diaphoretic.    ED Course  Procedures (including critical care time)  Labs Reviewed  CBC WITH DIFFERENTIAL - Abnormal; Notable for the following:    Hemoglobin 17.4 (*)     RDW 15.6 (*)     All other components within normal limits  COMPREHENSIVE METABOLIC PANEL - Abnormal; Notable for the following:    Chloride 93 (*)     Glucose, Bld 105 (*)  Calcium 10.7 (*)     Total Protein 8.8 (*)     GFR calc non Af Amer 77 (*)     GFR calc Af Amer 89 (*)     All other components within normal limits   No results found.   No diagnosis found.    MDM  The patient presents with vomiting for four days.  The labs appear reassuring and he does not appear clinically dehydrated.  The abdominal exam is benign.  He was hydrated with ns and medicated and is feeling better.  Will discharge to home with zofran, to return prn.          Geoffery Lyons, MD 01/28/12 [phone]

## 2012-01-28 NOTE — ED Notes (Signed)
States that he began vomiting since Tuesday 14 times a day. States that the vomiting started after teeth were extracted

## 2012-01-28 NOTE — ED Notes (Signed)
Per ems: Patient has 10 teeth pulled 4 days ago, and pain started to have N/V soon after. The patient is awake and alert in wheelchair to waiting room

## 2013-02-05 ENCOUNTER — Emergency Department (HOSPITAL_COMMUNITY)
Admission: EM | Admit: 2013-02-05 | Discharge: 2013-02-06 | Disposition: A | Discharge status: Home / Self Care | Payer: Self-pay | Attending: Psychiatry | Admitting: Psychiatry

## 2013-02-05 ENCOUNTER — Encounter (HOSPITAL_COMMUNITY): Payer: Self-pay | Admitting: Emergency Medicine

## 2013-02-05 ENCOUNTER — Emergency Department (HOSPITAL_COMMUNITY): Payer: Self-pay

## 2013-02-05 DIAGNOSIS — F411 Generalized anxiety disorder: Secondary | ICD-10-CM | POA: Insufficient documentation

## 2013-02-05 DIAGNOSIS — R112 Nausea with vomiting, unspecified: Secondary | ICD-10-CM | POA: Insufficient documentation

## 2013-02-05 DIAGNOSIS — F17200 Nicotine dependence, unspecified, uncomplicated: Secondary | ICD-10-CM | POA: Insufficient documentation

## 2013-02-05 DIAGNOSIS — F3289 Other specified depressive episodes: Secondary | ICD-10-CM | POA: Insufficient documentation

## 2013-02-05 DIAGNOSIS — R0789 Other chest pain: Secondary | ICD-10-CM | POA: Insufficient documentation

## 2013-02-05 DIAGNOSIS — F4320 Adjustment disorder, unspecified: Secondary | ICD-10-CM

## 2013-02-05 DIAGNOSIS — F419 Anxiety disorder, unspecified: Secondary | ICD-10-CM

## 2013-02-05 DIAGNOSIS — F32A Depression, unspecified: Secondary | ICD-10-CM

## 2013-02-05 DIAGNOSIS — R1084 Generalized abdominal pain: Secondary | ICD-10-CM | POA: Insufficient documentation

## 2013-02-05 DIAGNOSIS — R45851 Suicidal ideations: Secondary | ICD-10-CM | POA: Insufficient documentation

## 2013-02-05 DIAGNOSIS — F329 Major depressive disorder, single episode, unspecified: Secondary | ICD-10-CM

## 2013-02-05 DIAGNOSIS — I252 Old myocardial infarction: Secondary | ICD-10-CM | POA: Insufficient documentation

## 2013-02-05 DIAGNOSIS — Z9861 Coronary angioplasty status: Secondary | ICD-10-CM | POA: Insufficient documentation

## 2013-02-05 DIAGNOSIS — I10 Essential (primary) hypertension: Secondary | ICD-10-CM | POA: Insufficient documentation

## 2013-02-05 DIAGNOSIS — F4321 Adjustment disorder with depressed mood: Secondary | ICD-10-CM | POA: Insufficient documentation

## 2013-02-05 HISTORY — DX: Patient's other noncompliance with medication regimen for other reason: Z91.148

## 2013-02-05 HISTORY — DX: Patient's other noncompliance with medication regimen: Z91.14

## 2013-02-05 HISTORY — DX: Essential (primary) hypertension: I10

## 2013-02-05 HISTORY — DX: Other psychoactive substance abuse, uncomplicated: F19.10

## 2013-02-05 LAB — COMPREHENSIVE METABOLIC PANEL
ALBUMIN: 4 g/dL (ref 3.5–5.2)
ALT: 20 U/L (ref 0–53)
AST: 16 U/L (ref 0–37)
Alkaline Phosphatase: 73 U/L (ref 39–117)
BUN: 6 mg/dL (ref 6–23)
CALCIUM: 9.8 mg/dL (ref 8.4–10.5)
CO2: 27 mEq/L (ref 19–32)
CREATININE: 1.1 mg/dL (ref 0.50–1.35)
Chloride: 97 mEq/L (ref 96–112)
GFR calc non Af Amer: 86 mL/min — ABNORMAL LOW (ref >90)
GLUCOSE: 94 mg/dL (ref 70–99)
POTASSIUM: 4.2 meq/L (ref 3.7–5.3)
Sodium: 140 mEq/L (ref 137–147)
TOTAL PROTEIN: 7.5 g/dL (ref 6.0–8.3)
Total Bilirubin: 0.7 mg/dL (ref 0.3–1.2)

## 2013-02-05 LAB — URINALYSIS W MICROSCOPIC + REFLEX CULTURE
Glucose, UA: NEGATIVE mg/dL
Hgb urine dipstick: NEGATIVE
Ketones, ur: 40 mg/dL — AB
NITRITE: NEGATIVE
PH: 7 (ref 5.0–8.0)
Protein, ur: 100 mg/dL — AB
SPECIFIC GRAVITY, URINE: 1.038 — AB (ref 1.005–1.030)
UROBILINOGEN UA: 2 mg/dL — AB (ref 0.0–1.0)

## 2013-02-05 LAB — CBC WITH DIFFERENTIAL/PLATELET
BASOS PCT: 0 % (ref 0–1)
Basophils Absolute: 0 10*3/uL (ref 0.0–0.1)
EOS ABS: 0 10*3/uL (ref 0.0–0.7)
EOS PCT: 1 % (ref 0–5)
HCT: 47.4 % (ref 39.0–52.0)
HEMOGLOBIN: 17.1 g/dL — AB (ref 13.0–17.0)
LYMPHS ABS: 1.8 10*3/uL (ref 0.7–4.0)
Lymphocytes Relative: 34 % (ref 12–46)
MCH: 32.6 pg (ref 26.0–34.0)
MCHC: 36.1 g/dL — AB (ref 30.0–36.0)
MCV: 90.5 fL (ref 78.0–100.0)
MONOS PCT: 10 % (ref 3–12)
Monocytes Absolute: 0.5 10*3/uL (ref 0.1–1.0)
NEUTROS PCT: 55 % (ref 43–77)
Neutro Abs: 2.9 10*3/uL (ref 1.7–7.7)
Platelets: 226 10*3/uL (ref 150–400)
RBC: 5.24 MIL/uL (ref 4.22–5.81)
RDW: 15.2 % (ref 11.5–15.5)
WBC: 5.3 10*3/uL (ref 4.0–10.5)

## 2013-02-05 LAB — RAPID URINE DRUG SCREEN, HOSP PERFORMED
Amphetamines: NOT DETECTED
BARBITURATES: NOT DETECTED
Benzodiazepines: NOT DETECTED
Cocaine: NOT DETECTED
Opiates: NOT DETECTED
TETRAHYDROCANNABINOL: POSITIVE — AB

## 2013-02-05 LAB — ETHANOL: Alcohol, Ethyl (B): <11 mg/dL (ref 0–11)

## 2013-02-05 LAB — LIPASE, BLOOD: LIPASE: 17 U/L (ref 11–59)

## 2013-02-05 LAB — TROPONIN I

## 2013-02-05 MED ORDER — ARIPIPRAZOLE 10 MG PO TABS
10.0000 mg | ORAL_TABLET | Freq: Every day | ORAL | Status: DC
  Administered 2013-02-05: 10 mg via ORAL
  Filled 2013-02-05 (×3): qty 1

## 2013-02-05 MED ORDER — ASPIRIN EC 81 MG PO TBEC
81.0000 mg | DELAYED_RELEASE_TABLET | Freq: Every day | ORAL | Status: DC
  Administered 2013-02-06: 81 mg via ORAL
  Filled 2013-02-05 (×2): qty 1

## 2013-02-05 MED ORDER — LORAZEPAM 1 MG PO TABS
1.0000 mg | ORAL_TABLET | Freq: Once | ORAL | Status: AC
  Administered 2013-02-05: 1 mg via ORAL
  Filled 2013-02-05: qty 1

## 2013-02-05 MED ORDER — LORAZEPAM 1 MG PO TABS
1.0000 mg | ORAL_TABLET | ORAL | Status: DC | PRN
  Administered 2013-02-06: 1 mg via ORAL
  Filled 2013-02-05: qty 1

## 2013-02-05 MED ORDER — ONDANSETRON 4 MG PO TBDP: 8.0000 mg | ORAL_TABLET | Freq: Once | ORAL | Status: DC

## 2013-02-05 MED ORDER — CARBAMAZEPINE 200 MG PO TABS
400.0000 mg | ORAL_TABLET | Freq: Every day | ORAL | Status: DC
  Administered 2013-02-05: 400 mg via ORAL
  Filled 2013-02-05 (×3): qty 2

## 2013-02-05 MED ORDER — ONDANSETRON HCL 4 MG PO TABS
4.0000 mg | ORAL_TABLET | Freq: Three times a day (TID) | ORAL | Status: DC | PRN
Start: 2013-02-05 — End: 2013-02-06
  Administered 2013-02-05: 4 mg via ORAL
  Filled 2013-02-05: qty 1

## 2013-02-05 MED ORDER — CARBAMAZEPINE 200 MG PO TABS
200.0000 mg | ORAL_TABLET | Freq: Every morning | ORAL | Status: DC
  Administered 2013-02-06: 200 mg via ORAL
  Filled 2013-02-05 (×2): qty 1

## 2013-02-05 MED ORDER — ONDANSETRON 4 MG PO TBDP
8.0000 mg | ORAL_TABLET | Freq: Once | ORAL | Status: AC
  Administered 2013-02-05: 8 mg via ORAL
  Filled 2013-02-05: qty 2

## 2013-02-05 NOTE — ED Notes (Signed)
Dustin FlockAnnette Andrews notified of pt comment to walk out if admitted. Dr. Oletta LamasGhim notified of what patient stated, states he will sign IVC paperwork.

## 2013-02-05 NOTE — ED Notes (Signed)
[facility] [place] discussed with him what patient said, [facility] starting IVC paperwork to give to Dr. Oletta LamasGhim.

## 2013-02-05 NOTE — ED Notes (Signed)
Pt states he drank a few sips of ginger ale, still feels nausea but able to tolerate fluids

## 2013-02-05 NOTE — ED Notes (Signed)
Pt states "I will walk out of this hospital if you try to admit me to behavior health". Pt cooperative, but states he has his mothers funeral on Thursday and he cant miss his moms funeral.

## 2013-02-05 NOTE — ED Notes (Signed)
Pt presents via [facility] from home with c/o of Left axillary chest pain and generalized abdominal pain.  Pt does not take his normal medications [place]  Pt has had nausea and vomiting x 1 week.  Pt states he has had increased anxiety after his mother committed suicide this week.  Pt does not want to be committed for suicide but states he has a plan to kill himself either by "jumping from a bridge, walking in front of a moving bus but I am too afraid to hang myself".

## 2013-02-05 NOTE — ED Notes (Signed)
Telepsych completed.  Monitor removed and returned to Pod C.

## 2013-02-05 NOTE — ED Notes (Signed)
MD at bedside. 

## 2013-02-05 NOTE — ED Notes (Signed)
Pt advised that he was being Involuntarily committed by Dr. Oletta LamasGhim. Pt initially cooperative, became tearful and started to walk out of the room. Security Called, [employer] notified. Pt continued to walk out of the hospital via EMS bay. RN continued to redirect patient. Pt states "if you try to touch me there is going to be a problem".

## 2013-02-05 NOTE — Progress Notes (Signed)
B.Scottie Stanish, MHT informed by Trula Orehristina, RN attending that patient expressed SI with plan and states that he will walk out of hospital if he is kept here. Patient did walk out of hospital and was escorted back to room by security. Patient has been placed under IVC by Dr. Oletta LamasGhim. Writer assisted with IVC documents faxed to magistrate and custody order will be served. Patient know cooperative and experiencing nausea/vomiting.

## 2013-02-05 NOTE — ED Notes (Signed)
[facility] been accepted by Dr. Dub MikesLugo, pending a bed. Will call when ready.

## 2013-02-05 NOTE — BH Assessment (Signed)
Tele Assessment Note   [name] is an 38 y.o. male with history of Bipolar I Disorder. Sts that he lives with his girlfriend. Today at home he began to experience chest pain associated with an anxiety attack. Sts, "It was so bad that I thought I was having a heart attack" so his girlfriend called EMS. Pt was brought to the [facility] by EMS with additional complaints of no nausea and loss appetite x1 week. Patient was eventually medical clearance but reported suicidal ideations to staff. Says that he has felt suicidal for the past 4 yrs on/off. He also reports on/off suicidal plans: jump off a bridge, drink bleach, or walk in front of bus/truck. Patient further sts, "It would probably be a bus b/c it can't stop as fast as the truck". Although patient has frequent and reoccurring suicidal thoughts and plans often today he denies. Sts that he is able to contract for safety and wanting to discharge home. Pt is concerned that he will miss his mothers funeral if hospitalized.  His suicidal thoughts earlier today was triggered by the passing of his mother last Tuesday. Says that she was found dead in her home with self injuries and possible suicide was the cause of her death. Patient admits to obsessive thoughts of wanting to kill himself. Also, he reports auditory hallucinations of "God's voice" telling me to "[name] myself or die". He denies HI. Reports social alcohol use and also uses THC. No hx of inpatient mental health hospitalizations. However, in 2004 he was admitted to a medical floor for a intentional overdose. He does not have a outpatient mental health provider at this time nor is he prescribed any psychotropics.   Patient ran by Dr. Geoffery Lyons who feels that patient is a potential threat to himself evidence by prior history of suicide attempts. Pt also has current on/off thoughts with associated plans that come and go. Pt accepted by Dr. Geoffery Lyons. Per [facility] no beds are available at this time.  Patient needs a 500 hall bed and may have an available bed later this evening depending on discharges of patient's already on the [facility] unit.    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 MI, old 2009  . Bipolar 1 disorder   . Hypertension   . High cholesterol   . Normal cardiac stress test 2012    normal myoview  . Hx of medication noncompliance   . Polysubstance abuse    Axis IV: bereavement, support system, enviormental  Axis V: 30  Past Surgical History  Procedure Laterality Date  . Femoral artery stent    . Coronary angioplasty with stent placement  2009    Family History: History reviewed. No pertinent family history.  Social History:  reports that he has been smoking Cigarettes.  He has been smoking about 0.50 packs per day. He does not have any smokeless tobacco history on file. He reports that he drinks alcohol. He reports that he uses illicit drugs (Marijuana).  Additional Social History:  Alcohol / Drug Use Pain Medications: SEE MAR Prescriptions: SEE MAR Over the Counter: SEE MAR History of alcohol / drug use?: Yes Substance #1 Name of Substance 1: Alcohol  1 - Age of First Use: teens 1 - Amount (size/oz): (1) 40 oz beer  1 - Frequency: 1x every 3  or 4 days  1 - Duration: on-gong  1 - Last Use / Amount: 1 week ago  Substance #2 Name of Substance 2: THC 2 - Age of First Use: teens 2 - Amount (size/oz): varies  2 - Frequency: socially; 1x use every 3-4 days  2 - Duration: on-gonig  2 - Last Use / Amount: 1 week ago   CIWA: CIWA-Ar BP: 135/79 mmHg Pulse Rate: 78 COWS:    Allergies:  Allergies  Allergen Reactions  . Iodine Shortness Of Breath and Swelling  . Shrimp [Shellfish Allergy] Anaphylaxis    Home Medications:  (Not in a hospital admission)  OB/GYN  Status:  No LMP for male patient.  General Assessment Data [facility] Is this a Tele or Face-to-Face Assessment?: Tele Assessment Is this an Initial Assessment or a Re-assessment for this encounter?: Initial Assessment Living Arrangements: Other (Comment) (pt lives with girlfriend) Can pt return to current living arrangement?: Yes Admission Status: Voluntary Is patient capable of signing voluntary admission?: Yes Transfer from: Acute Hospital Referral Source: Self/Family/Friend     [facility] Living Arrangements: Other (Comment) (pt lives with girlfriend) Name of Psychiatrist:  (No psychiatrist ) Name of Therapist:  (No therapist )  Education Status Is patient currently in school?: No  Risk to self Suicidal Ideation: Yes-Currently Present Suicidal Intent: No Is patient at risk for suicide?: No Suicidal Plan?: No (No curent plan;on/off thoughts to hang self, walk in trafic,) Access to Means: Yes Specify Access to Suicidal Means:  (access to traffic, ability to walk, bleach, hanging material) What has been your use of drugs/alcohol within the last 12 months?:  (pt reports social use of alcohol and thc) Previous Attempts/Gestures: Yes How many times?:  (1x pt tried to hang self but girlfriend walked in stopped hi) Other Self Harm Risks:  (none reported ) Triggers for Past Attempts: Other (Comment) (psychosis "God was talking to me" and depression) Intentional Self Injurious Behavior: None Family Suicide History: Unknown Recent stressful life event(s): Other (Comment) (mother passed away last Tuesday ) Persecutory voices/beliefs?: No Depression: Yes Depression Symptoms: Feeling angry/irritable;Feeling worthless/self pity;Loss of interest in usual pleasures;Guilt;Fatigue;Isolating;Tearfulness;Insomnia;Despondent Substance abuse history and/or treatment for substance abuse?: No Suicide prevention information given to non-admitted patients: Yes  Risk  to Others Homicidal Ideation: No Thoughts of Harm to Others: No Current Homicidal Intent: No Current Homicidal Plan: No Access to Homicidal Means: No Identified Victim:  (n/a) History of harm to others?: No Assessment of Violence: None Noted Violent Behavior Description:  (patient is calm and cooperative ) Does patient have access to weapons?: No Criminal Charges Pending?: No Does patient have a court date: No  Psychosis Hallucinations: None noted Delusions: None noted  Mental Status Report Appear/Hygiene: Disheveled Eye Contact: Fair Motor Activity: Freedom of movement Speech: Logical/coherent Level of Consciousness: Alert Mood: Depressed Affect: Appropriate to circumstance Anxiety Level: None Thought Processes: Coherent;Relevant Judgement: Unimpaired Orientation: Person;Place;Time;Situation Obsessive Compulsive Thoughts/Behaviors: None  Cognitive Functioning Concentration: Decreased Memory: Remote Intact;Recent Intact IQ: Average Insight: Good Impulse Control: Good Appetite: Good Weight Loss:  (none reported ) Weight Gain:  (none reported ) Sleep: No Change Total Hours of Sleep:  ("Since my mother passed away I have been sleeping well") Vegetative Symptoms: None  ADLScreening [facility]) Patient's cognitive ability adequate to safely complete daily activities?: Yes Patient able to express need for assistance with ADLs?: Yes Independently performs ADLs?: Yes (appropriate for developmental age)  Prior Inpatient Therapy Prior Inpatient Therapy: Yes Prior Therapy  Dates:  (Medical floor due to OD 2004 (not admitted to [facility])) Prior Therapy Facilty/Provider(s):  (Medical Floor due to overdose) Reason for Treatment:  (overdose)  Prior Outpatient Therapy Prior Outpatient Therapy: No Prior Therapy Dates:  (n/a) Prior Therapy Facilty/Provider(s):  (n/a) Reason for Treatment:  (n/a)  ADL Screening (condition at time of admission) Patient's cognitive  ability adequate to safely complete daily activities?: Yes Is the patient deaf or have difficulty hearing?: No Does the patient have difficulty seeing, even when wearing glasses/contacts?: No Does the patient have difficulty concentrating, remembering, or making decisions?: No Patient able to express need for assistance with ADLs?: Yes Does the patient have difficulty dressing or bathing?: No Independently performs ADLs?: Yes (appropriate for developmental age) Does the patient have difficulty walking or climbing stairs?: No Weakness of Legs: None Weakness of Arms/Hands: None  Home Assistive Devices/Equipment Home Assistive Devices/Equipment: None    Abuse/Neglect Assessment (Assessment to be complete while patient is alone) Physical Abuse: Denies Verbal Abuse: Denies Sexual Abuse: Denies Exploitation of patient/patient's resources: Denies Self-Neglect: Denies Values / Beliefs Cultural Requests During Hospitalization: None Spiritual Requests During Hospitalization: None   Advance Directives (For Healthcare) Advance Directive: Patient does not have advance directive Nutrition Screen- [facility] Adult/WL/AP Patient's home diet: Regular  Additional Information 1:1 In Past 12 Months?: No CIRT Risk: No Elopement Risk: No Does patient have medical clearance?: Yes     Disposition:  Disposition Initial Assessment Completed for this Encounter: Yes Disposition of Patient: Inpatient treatment program Type of inpatient treatment program: Adult  Octaviano Batty 02/05/2013 3:41 PM

## 2013-02-05 NOTE — ED Provider Notes (Signed)
CSN: [id number]     Arrival date & time 02/05/13  1222 History   First MD Initiated Contact with Patient 02/05/13 1241     Chief Complaint  Patient presents with  . Chest Pain  . Abdominal Pain  . Emesis  . Medical Clearance    HPI Pt was seen at 1310.  Per pt, c/o gradual onset and worsening of persistent anxiety, depression and SI for the past several years, worse over the past 1 week. Symptoms include: ultiple intermittent episodes of N/V, vague generalized abd "pain," as well as "sharp" left chest wall/axillary pain that has been occurring intermittently for the past 1 week. States the chest pain lasts for "30 seconds" each episode before stopping and recurring again. Pain worsens with palpation of the area and body position changes. Pt states his symptoms began after he found out his mother "committed suicide." States he feels SI and has a plan to "either jump off a bridge or walk in front of a moving bus" because "I'm too afraid to hang myself." States his having auditory hallucinations, hearing "God's" voice telling him to kill himself. Denies palpitations, no SOB/cough, no back pain, no diarrhea, no fevers, no black or blood in stools or emesis, no SA, no HI.     Past Medical History  Diagnosis Date  . MI, old 2009  . Bipolar 1 disorder   . Hypertension   . High cholesterol   . Normal cardiac stress test 2012    normal myoview  . Hx of medication noncompliance   . Polysubstance abuse    Past Surgical History  Procedure Laterality Date  . Femoral artery stent    . Coronary angioplasty with stent placement  2009    History  Substance Use Topics  . Smoking status: Current Every Day Smoker -- 0.50 packs/day    Types: Cigarettes  . Smokeless tobacco: Not on file  . Alcohol Use: Yes    Review of Systems ROS: Statement: All systems negative except as marked or noted in the HPI; Constitutional: Negative for fever and chills. ; ; Eyes: Negative for eye pain, redness and  discharge. ; ; ENMT: Negative for ear pain, hoarseness, nasal congestion, sinus pressure and sore throat. ; ; Cardiovascular: +CP. Negative for palpitations, diaphoresis, dyspnea and peripheral edema. ; ; Respiratory: Negative for cough, wheezing and stridor. ; ; Gastrointestinal: +abd pain, N/V. Negative for diarrhea, blood in stool, hematemesis, jaundice and rectal bleeding. . ; ; Genitourinary: Negative for dysuria, flank pain and hematuria. ; ; Musculoskeletal: Negative for back pain and neck pain. Negative for swelling and trauma.; ; Skin: Negative for pruritus, rash, abrasions, blisters, bruising and skin lesion.; ; Neuro: Negative for headache, lightheadedness and neck stiffness. Negative for weakness, altered level of consciousness , altered mental status, extremity weakness, paresthesias, involuntary movement, seizure and syncope.; Psych:  +anxiety, +SI with plan, auditory hallucinations. No SA, no HI.     Allergies  Iodine and Shrimp  Home Medications  No current outpatient prescriptions on file. BP 135/79  Pulse 78  Temp(Src) 98.5 F (36.9 C) (Oral)  Resp 18  Ht 5\' 11"  (1.803 m)  Wt 137 lb (62.143 kg)  BMI 19.12 kg/m2  SpO2 93% Physical Exam 1315: Physical examination:  Nursing notes reviewed; Vital signs and O2 SAT reviewed;  Constitutional: Well developed, Well nourished, Well hydrated, In no acute distress; Head:  Normocephalic, atraumatic; Eyes: EOMI, PERRL, No scleral icterus; ENMT: Mouth and pharynx normal, Mucous membranes moist; Neck: Supple, Full range  of motion, No lymphadenopathy; Cardiovascular: Regular rate and rhythm, No murmur, rub, or gallop; Respiratory: Breath sounds clear & equal bilaterally, No rales, rhonchi, wheezes.  Speaking full sentences with ease, Normal respiratory effort/excursion; Chest: Nontender, Movement normal; Abdomen: Soft, Nontender, Nondistended, Normal bowel sounds; Genitourinary: No CVA tenderness; Extremities: Pulses normal, No tenderness, No  edema, No calf edema or asymmetry.; Neuro: AA&Ox3, Major CN grossly intact.  Speech clear. No gross focal motor or sensory deficits in extremities. Climbs on and off stretcher easily by himself. Gait steady.; Skin: Color normal, Warm, Dry.; Psych:  +SI with plan.    ED Course  Procedures    EKG Interpretation    Date/Time:  Monday February 05 2013 12:51:53 EST Ventricular Rate:  59 PR Interval:  138 QRS Duration: 112 QT Interval:  403 QTC Calculation: 399 R Axis:   71 Text Interpretation:  Sinus rhythm Borderline intraventricular conduction delay RSR' in V1 or V2, right VCD or RVH ST elev, probable normal early repol pattern When compared with ECG of 05/03/2000 No significant change was found Confirmed by [facility]  MD, Nicholos Johns [id number]) on 02/05/2013 12:58:43 PM            MDM  MDM Reviewed: previous chart, nursing note and vitals Reviewed previous: labs and ECG Interpretation: labs, ECG and x-ray     Results for orders placed during the hospital encounter of 02/05/13  ETHANOL      Result Value Range   Alcohol, Ethyl (B) <11  0 - 11 mg/dL  URINE RAPID DRUG SCREEN (HOSP PERFORMED)      Result Value Range   Opiates NONE DETECTED  NONE DETECTED   Cocaine NONE DETECTED  NONE DETECTED   Benzodiazepines NONE DETECTED  NONE DETECTED   Amphetamines NONE DETECTED  NONE DETECTED   Tetrahydrocannabinol POSITIVE (*) NONE DETECTED   Barbiturates NONE DETECTED  NONE DETECTED  URINALYSIS W MICROSCOPIC + REFLEX CULTURE      Result Value Range   Color, Urine AMBER (*) YELLOW   APPearance CLEAR  CLEAR   Specific Gravity, Urine 1.038 (*) 1.005 - 1.030   pH 7.0  5.0 - 8.0   Glucose, UA NEGATIVE  NEGATIVE mg/dL   Hgb urine dipstick NEGATIVE  NEGATIVE   Bilirubin Urine MODERATE (*) NEGATIVE   Ketones, ur 40 (*) NEGATIVE mg/dL   Protein, ur 960 (*) NEGATIVE mg/dL   Urobilinogen, UA 2.0 (*) 0.0 - 1.0 mg/dL   Nitrite NEGATIVE  NEGATIVE   Leukocytes, UA TRACE (*) NEGATIVE   WBC, UA 0-2   <3 WBC/hpf   Bacteria, UA RARE  RARE   Squamous Epithelial / LPF FEW (*) RARE   Urine-Other MUCOUS PRESENT    CBC WITH DIFFERENTIAL      Result Value Range   WBC 5.3  4.0 - 10.5 K/uL   RBC 5.24  4.22 - 5.81 MIL/uL   Hemoglobin 17.1 (*) 13.0 - 17.0 g/dL   HCT 45.4  09.8 - 11.9 %   MCV 90.5  78.0 - 100.0 fL   MCH 32.6  26.0 - 34.0 pg   MCHC 36.1 (*) 30.0 - 36.0 g/dL   RDW 14.7  82.9 - 56.2 %   Platelets 226  150 - 400 K/uL   Neutrophils Relative % 55  43 - 77 %   Neutro Abs 2.9  1.7 - 7.7 K/uL   Lymphocytes Relative 34  12 - 46 %   Lymphs Abs 1.8  0.7 - 4.0 K/uL   Monocytes Relative 10  3 - 12 %   Monocytes Absolute 0.5  0.1 - 1.0 K/uL   Eosinophils Relative 1  0 - 5 %   Eosinophils Absolute 0.0  0.0 - 0.7 K/uL   Basophils Relative 0  0 - 1 %   Basophils Absolute 0.0  0.0 - 0.1 K/uL  COMPREHENSIVE METABOLIC PANEL      Result Value Range   Sodium 140  137 - 147 mEq/L   Potassium 4.2  3.7 - 5.3 mEq/L   Chloride 97  96 - 112 mEq/L   CO2 27  19 - 32 mEq/L   Glucose, Bld 94  70 - 99 mg/dL   BUN 6  6 - 23 mg/dL   Creatinine, Ser 1.61  0.50 - 1.35 mg/dL   Calcium 9.8  8.4 - 09.6 mg/dL   Total Protein 7.5  6.0 - 8.3 g/dL   Albumin 4.0  3.5 - 5.2 g/dL   AST 16  0 - 37 U/L   ALT 20  0 - 53 U/L   Alkaline Phosphatase 73  39 - 117 U/L   Total Bilirubin 0.7  0.3 - 1.2 mg/dL   GFR calc non Af Amer 86 (*) >90 mL/min   GFR calc Af Amer >90  >90 mL/min  LIPASE, BLOOD      Result Value Range   Lipase 17  11 - 59 U/L  TROPONIN I      Result Value Range   Troponin I <0.30  <0.30 ng/mL   Dg Abd Acute W/chest 02/05/2013   CLINICAL DATA:  Pain.  EXAM: ACUTE ABDOMEN SERIES (ABDOMEN 2 VIEW & CHEST 1 VIEW)  COMPARISON:  Chest x-ray 10/01/2010.  FINDINGS: Subtle symmetric nodular densities noted projected over the region of the nipples bilaterally. These are consistent with nipple shadows. Soft tissue structures are unremarkable. The gas pattern is nonspecific. No free air is identified.   IMPRESSION: Nonspecific abdominal exam.  No acute cardiopulmonary disease.   Electronically Signed   By: Maisie Fus  Register   On: 02/05/2013 14:12    1515:  Pt has tol PO well without N/V after zofran ODT. Doubt PE as cause for CP symptoms with low risk [place].  Doubt ACS as cause for symptoms with normal troponin and unchanged EKG from previous after 1 week of atypical symptoms.  Psych Dr. Dub Mikes has evaluated pt: pt has been accepted to [facility] pending a bed. Will move to Pod C while placement pending.      Laray Anger, DO 02/06/13 [phone]

## 2013-02-05 NOTE — ED Notes (Signed)
Tele psych placed at bedside. 

## 2013-02-05 NOTE — ED Notes (Signed)
Relieved Sitter for restroom break. 

## 2013-02-06 ENCOUNTER — Inpatient Hospital Stay (HOSPITAL_COMMUNITY): Admission: AD | Admit: 2013-02-06 | Payer: Self-pay | Source: Intra-hospital | Admitting: Psychiatry

## 2013-02-06 DIAGNOSIS — F3130 Bipolar disorder, current episode depressed, mild or moderate severity, unspecified: Secondary | ICD-10-CM

## 2013-02-06 DIAGNOSIS — R45851 Suicidal ideations: Secondary | ICD-10-CM

## 2013-02-06 MED ORDER — ONDANSETRON 8 MG PO TBDP: 8.0000 mg | ORAL_TABLET | Freq: Three times a day (TID) | ORAL | Status: DC | PRN

## 2013-02-06 MED ORDER — NICOTINE 21 MG/24HR TD PT24
21.0000 mg | MEDICATED_PATCH | Freq: Every day | TRANSDERMAL | Status: DC
  Administered 2013-02-06: 21 mg via TRANSDERMAL
  Filled 2013-02-06: qty 1

## 2013-02-06 NOTE — ED Provider Notes (Addendum)
Filed Vitals:   02/06/13 0555  BP: 135/97  Pulse: 67  Temp: 98.4 F (36.9 C)  Resp: 18   Awaiting psych placement.  Celene KrasJon R Nasiyah Laverdiere, MD 02/06/13 [phone]  Pt was assessed by psych pa.  Pt stable for discharge.  Outpatient follow up.  IVC rescinded  Celene KrasJon R Emillia Weatherly, MD 02/06/13 1400

## 2013-02-06 NOTE — BHH Counselor (Signed)
TTS telepsych consult ordered. Writer spoke w/ Nani SkillernJohn Conrad Withrow NP re: telepsych consult.  Evette Cristalaroline Paige Masaichi Kracht, [place] Assessment Counselor

## 2013-02-06 NOTE — ED Notes (Signed)
TELEPSYCH IN PROGRESS 

## 2013-02-06 NOTE — Discharge Instructions (Signed)
Depression, Adult °Depression is feeling sad, low, down in the dumps, blue, gloomy, or empty. In general, there are two kinds of depression: °· Normal sadness or grief. This can happen after something upsetting. It often goes away on its own within 2 weeks. After losing a loved one (bereavement), normal sadness and grief may last longer than two weeks. It usually gets better with time. °· Clinical depression. This kind lasts longer than normal sadness or grief. It keeps you from doing the things you normally do in life. It is often hard to function at home, work, or at school. It may affect your relationships with others. Treatment is often needed. °GET HELP RIGHT AWAY IF: °· You have thoughts about hurting yourself or others. °· You lose touch with reality (psychotic symptoms). You may: °· See or hear things that are not real. °· Have untrue beliefs about your life or people around you. °· Your medicine is giving you problems. °MAKE SURE YOU: °· Understand these instructions. °· Will watch your condition. °· Will get help right away if you are not doing well or get worse. °Document Released: 01/30/2010 Document Revised: 09/22/2011 Document Reviewed: 04/29/2011 °ExitCare® Patient Information ©2014 [employer]. ° °

## 2013-02-06 NOTE — Progress Notes (Signed)
B.Helen Winterhalter, MHT provided follow up with recommended disposition. Patient has been seen by Marcie Mowersr.Meghan Blankmann, NP and was given a recommendation [facility]. Writer spoke with patient in regards to following up with [facility]. Patient acknowledged understanding of where [facility] and times to walk in for intake. Writer consulted with Dr. Lynelle DoctorKnapp who is in agreement with discharge plan and will rescind IVC.

## 2013-02-06 NOTE — Consult Note (Signed)
Telepsych Consultation   Reason for Consult:  SI/depression  Referring Physician: SI/depression  [name] is an 38 y.o. male.  Assessment: AXIS I:  Bipolar, Depressed AXIS II:  Deferred AXIS III:   Past Medical History  Diagnosis Date  . MI, old 2009  . Bipolar 1 disorder   . Hypertension   . High cholesterol   . Normal cardiac stress test 2012    normal myoview  . Hx of medication noncompliance   . Polysubstance abuse    AXIS IV:  economic problems, educational problems, housing problems, occupational problems, other psychosocial or environmental problems, problems related to legal system/crime, problems related to social environment, problems with access to health care services and problems with primary support group AXIS V:  61-70 mild symptoms  Plan:  No evidence of imminent risk to self or others at present.    Subjective:   [name] is a 38 y.o. male patient with SI/depression  HPI:    Pt was seen at 1310. Per pt, c/o gradual onset and worsening of persistent anxiety, depression and SI for the past several years, worse over the past 1 week. Symptoms include: ultiple intermittent episodes of N/V, vague generalized abd "pain," as well as "sharp" left chest wall/axillary pain that has been occurring intermittently for the past 1 week. States the chest pain lasts for "30 seconds" each episode before stopping and recurring again. Pain worsens with palpation of the area and body position changes. Pt states his symptoms began after he found out his mother "committed suicide." States he feels SI and has a plan to "either jump off a bridge or walk in front of a moving bus" because "I'm too afraid to hang myself." States his having auditory hallucinations, hearing "God's" voice telling him to kill himself. Denies palpitations, no SOB/cough, no back pain, no diarrhea, no fevers, no black or blood in stools or emesis, no SA, no HI. He states he gets 7-8 hours of sleep;  appetite is good; Mood-"good." He denies SI/HI/AVH.    HPI Elements:   Location:  SI/depression. Quality:  fair. Severity:  mild. Timing:  past week. Duration:  since 2013 . Context:  mom committed suicide.  Past Psychiatric History: Past Medical History  Diagnosis Date  . MI, old 2009  . Bipolar 1 disorder   . Hypertension   . High cholesterol   . Normal cardiac stress test 2012    normal myoview  . Hx of medication noncompliance   . Polysubstance abuse     reports that he has been smoking Cigarettes.  He has been smoking about 0.50 packs per day. He does not have any smokeless tobacco history on file. He reports that he drinks alcohol. He reports that he uses illicit drugs (Marijuana). History reviewed. No pertinent family history. Family History Substance Abuse: No Family Supports: Yes, List: Living Arrangements: Other (Comment) (pt lives with girlfriend) Can pt return to current living arrangement?: Yes Allergies:   Allergies  Allergen Reactions  . Iodine Shortness Of Breath and Swelling  . Shrimp [Shellfish Allergy] Anaphylaxis    ACT Assessment Complete:  Yes:    Educational Status    Risk to Self: Risk to self Suicidal Ideation: Yes-Currently Present Suicidal Intent: No Is patient at risk for suicide?: No Suicidal Plan?: No (No curent plan;on/off thoughts to hang self, walk in trafic,) Access to Means: Yes Specify Access to Suicidal Means:  (access to traffic, ability to walk, bleach, hanging material) What has been your  use of drugs/alcohol within the last 12 months?:  (pt reports social use of alcohol and thc) Previous Attempts/Gestures: Yes How many times?:  (1x pt tried to hang self but girlfriend walked in stopped hi) Other Self Harm Risks:  (none reported ) Triggers for Past Attempts: Other (Comment) (psychosis "God was talking to me" and depression) Intentional Self Injurious Behavior: None Family Suicide History: Unknown Recent stressful life  event(s): Other (Comment) (mother passed away last 03/31/2022 ) Persecutory voices/beliefs?: No Depression: Yes Depression Symptoms: Feeling angry/irritable;Feeling worthless/self pity;Loss of interest in usual pleasures;Guilt;Fatigue;Isolating;Tearfulness;Insomnia;Despondent Substance abuse history and/or treatment for substance abuse?: No Suicide prevention information given to non-admitted patients: Yes  Risk to Others: Risk to Others Homicidal Ideation: No Thoughts of Harm to Others: No Current Homicidal Intent: No Current Homicidal Plan: No Access to Homicidal Means: No Identified Victim:  (n/a) History of harm to others?: No Assessment of Violence: None Noted Violent Behavior Description:  (patient is calm and cooperative ) Does patient have access to weapons?: No Criminal Charges Pending?: No Does patient have a court date: No  Abuse: Abuse/Neglect Assessment (Assessment to be complete while patient is alone) Physical Abuse: Denies Verbal Abuse: Denies Sexual Abuse: Denies Exploitation of patient/patient's resources: Denies Self-Neglect: Denies  Prior Inpatient Therapy: Prior Inpatient Therapy Prior Inpatient Therapy: Yes Prior Therapy Dates:  (Medical floor due to OD 2004 (not admitted to [facility])) Prior Therapy Facilty/Provider(s):  (Medical Floor due to overdose) Reason for Treatment:  (overdose)  Prior Outpatient Therapy: Prior Outpatient Therapy Prior Outpatient Therapy: No Prior Therapy Dates:  (n/a) Prior Therapy Facilty/Provider(s):  (n/a) Reason for Treatment:  (n/a)  Additional Information: Additional Information 1:1 In Past 12 Months?: No CIRT Risk: No Elopement Risk: No Does patient have medical clearance?: Yes                  Objective: Blood pressure 138/92, pulse 70, temperature 97 F (36.1 C), temperature source Oral, resp. rate 20, height '5\' 11"'  (1.803 m), weight 137 lb (62.143 kg), SpO2 98.00%.Body mass index is 19.12 kg/(m^2). Results for  orders placed during the hospital encounter of 02/05/13 (from the past 72 hour(s))  ETHANOL     Status: None   Collection Time    02/05/13  1:27 PM      Result Value Range   Alcohol, Ethyl (B) <11  0 - 11 mg/dL   Comment:            LOWEST DETECTABLE LIMIT FOR     SERUM ALCOHOL IS 11 mg/dL     FOR MEDICAL PURPOSES ONLY  CBC WITH DIFFERENTIAL     Status: Abnormal   Collection Time    02/05/13  1:27 PM      Result Value Range   WBC 5.3  4.0 - 10.5 K/uL   RBC 5.24  4.22 - 5.81 MIL/uL   Hemoglobin 17.1 (*) 13.0 - 17.0 g/dL   HCT 47.4  39.0 - 52.0 %   MCV 90.5  78.0 - 100.0 fL   MCH 32.6  26.0 - 34.0 pg   MCHC 36.1 (*) 30.0 - 36.0 g/dL   RDW 15.2  11.5 - 15.5 %   Platelets 226  150 - 400 K/uL   Neutrophils Relative % 55  43 - 77 %   Neutro Abs 2.9  1.7 - 7.7 K/uL   Lymphocytes Relative 34  12 - 46 %   Lymphs Abs 1.8  0.7 - 4.0 K/uL   Monocytes Relative 10  3 -  12 %   Monocytes Absolute 0.5  0.1 - 1.0 K/uL   Eosinophils Relative 1  0 - 5 %   Eosinophils Absolute 0.0  0.0 - 0.7 K/uL   Basophils Relative 0  0 - 1 %   Basophils Absolute 0.0  0.0 - 0.1 K/uL  COMPREHENSIVE METABOLIC PANEL     Status: Abnormal   Collection Time    02/05/13  1:27 PM      Result Value Range   Sodium 140  137 - 147 mEq/L   Potassium 4.2  3.7 - 5.3 mEq/L   Chloride 97  96 - 112 mEq/L   CO2 27  19 - 32 mEq/L   Glucose, Bld 94  70 - 99 mg/dL   BUN 6  6 - 23 mg/dL   Creatinine, Ser 1.10  0.50 - 1.35 mg/dL   Calcium 9.8  8.4 - 10.5 mg/dL   Total Protein 7.5  6.0 - 8.3 g/dL   Albumin 4.0  3.5 - 5.2 g/dL   AST 16  0 - 37 U/L   ALT 20  0 - 53 U/L   Alkaline Phosphatase 73  39 - 117 U/L   Total Bilirubin 0.7  0.3 - 1.2 mg/dL   GFR calc non Af Amer 86 (*) >90 mL/min   GFR calc Af Amer >90  >90 mL/min   Comment: (NOTE)     The eGFR has been calculated using the CKD EPI equation.     This calculation has not been validated in all clinical situations.     eGFR's persistently <90 mL/min signify possible  Chronic Kidney     Disease.  LIPASE, BLOOD     Status: None   Collection Time    02/05/13  1:27 PM      Result Value Range   Lipase 17  11 - 59 U/L  TROPONIN I     Status: None   Collection Time    02/05/13  1:27 PM      Result Value Range   Troponin I <0.30  <0.30 ng/mL   Comment:            Due to the release kinetics of cTnI,     a negative result within the first hours     of the onset of symptoms does not rule out     myocardial infarction with certainty.     If myocardial infarction is still suspected,     repeat the test at appropriate intervals.  URINE RAPID DRUG SCREEN (HOSP PERFORMED)     Status: Abnormal   Collection Time    02/05/13  3:00 PM      Result Value Range   Opiates NONE DETECTED  NONE DETECTED   Cocaine NONE DETECTED  NONE DETECTED   Benzodiazepines NONE DETECTED  NONE DETECTED   Amphetamines NONE DETECTED  NONE DETECTED   Tetrahydrocannabinol POSITIVE (*) NONE DETECTED   Barbiturates NONE DETECTED  NONE DETECTED   Comment:            DRUG SCREEN FOR MEDICAL PURPOSES     ONLY.  IF CONFIRMATION IS NEEDED     FOR ANY PURPOSE, NOTIFY LAB     WITHIN 5 DAYS.                LOWEST DETECTABLE LIMITS     FOR URINE DRUG SCREEN     Drug Class       Cutoff (ng/mL)     Amphetamine  1000     Barbiturate      200     Benzodiazepine   892     Tricyclics       119     Opiates          300     Cocaine          300     THC              50  URINALYSIS W MICROSCOPIC + REFLEX CULTURE     Status: Abnormal   Collection Time    02/05/13  3:00 PM      Result Value Range   Color, Urine AMBER (*) YELLOW   Comment: BIOCHEMICALS MAY BE AFFECTED BY COLOR   APPearance CLEAR  CLEAR   Specific Gravity, Urine 1.038 (*) 1.005 - 1.030   pH 7.0  5.0 - 8.0   Glucose, UA NEGATIVE  NEGATIVE mg/dL   Hgb urine dipstick NEGATIVE  NEGATIVE   Bilirubin Urine MODERATE (*) NEGATIVE   Ketones, ur 40 (*) NEGATIVE mg/dL   Protein, ur 100 (*) NEGATIVE mg/dL   Urobilinogen, UA 2.0  (*) 0.0 - 1.0 mg/dL   Nitrite NEGATIVE  NEGATIVE   Leukocytes, UA TRACE (*) NEGATIVE   WBC, UA 0-2  <3 WBC/hpf   Bacteria, UA RARE  RARE   Squamous Epithelial / LPF FEW (*) RARE   Urine-Other MUCOUS PRESENT     Labs are reviewed and are pertinent for THC  Current Facility-Administered Medications  Medication Dose Route Frequency Provider Last Rate Last Dose  . ARIPiprazole (ABILIFY) tablet 10 mg  10 mg Oral QHS Sharyon Cable, MD   10 mg at 02/05/13 2134  . aspirin EC tablet 81 mg  81 mg Oral Daily Sharyon Cable, MD   81 mg at 02/06/13 0910  . carbamazepine (TEGRETOL) tablet 200 mg  200 mg Oral q morning - 10a Sharyon Cable, MD   200 mg at 02/06/13 0910  . carbamazepine (TEGRETOL) tablet 400 mg  400 mg Oral QHS Sharyon Cable, MD   400 mg at 02/05/13 2132  . LORazepam (ATIVAN) tablet 1 mg  1 mg Oral Q4H PRN Sharyon Cable, MD   1 mg at 02/06/13 0910  . nicotine (NICODERM CQ - dosed in mg/24 hours) patch 21 mg  21 mg Transdermal Daily Kathalene Frames, MD   21 mg at 02/06/13 1110  . ondansetron (ZOFRAN) tablet 4 mg  4 mg Oral Q8H PRN Sharyon Cable, MD   4 mg at 02/05/13 1726   Current Outpatient Prescriptions  Medication Sig Dispense Refill  . ARIPiprazole (ABILIFY) 10 MG tablet Take 10 mg by mouth daily.      Marland Kitchen aspirin 81 MG tablet Take 81 mg by mouth daily.      . carbamazepine (TEGRETOL) 200 MG tablet Take 200 mg by mouth every morning.      . carbamazepine (TEGRETOL) 200 MG tablet Take 400 mg by mouth at bedtime.        Psychiatric Specialty Exam:     Blood pressure 138/92, pulse 70, temperature 97 F (36.1 C), temperature source Oral, resp. rate 20, height '5\' 11"'  (1.803 m), weight 137 lb (62.143 kg), SpO2 98.00%.Body mass index is 19.12 kg/(m^2).  General Appearance: Casual  Eye Contact::  Fair  Speech:  Normal Rate  Volume:  Normal  Mood:  Anxious and Dysphoric  Affect:  Restricted  Thought Process:  Coherent  Orientation:  Full (Time, Place, and Person)   Thought Content:  WDL  Suicidal Thoughts:  No  Homicidal Thoughts:  No  Memory:  Immediate;   Fair Recent;   Fair Remote;   Fair  Judgement:  Fair  Insight:  Fair  Psychomotor Activity:  Normal  Concentration:  Fair  Recall:  Fair  Akathisia:  No  Handed:  Right  AIMS (if indicated):     Assets:  Leisure Time Physical Health Resilience Social Support  Sleep:   fair    Treatment Plan Summary: Patient is a 38 year old AAM, here with SI/depression; it appears situational, after his mother committed suicide. He has a history of Bipolar, since 2013 at [place], but never took medication because it was too expensive. He denies any SI/HI/AVH. He was advised to follow up with [facility] for outpatient services for medications and therapy. His IVC can be reversed.    Disposition: Disposition Initial Assessment Completed for this Encounter: Yes Disposition of Patient: Inpatient treatment program Type of inpatient treatment program: Adult  Madison Hickman [facility] 02/06/2013 1:13 PM   Agree with assessment and plan Geralyn Flash A. Sabra Heck, M.D.

## 2013-04-15 ENCOUNTER — Encounter (HOSPITAL_COMMUNITY): Payer: Self-pay | Admitting: Emergency Medicine

## 2013-04-15 ENCOUNTER — Observation Stay (HOSPITAL_COMMUNITY)
Admission: EM | Admit: 2013-04-15 | Discharge: 2013-04-16 | Disposition: A | Discharge status: Home / Self Care | Payer: Self-pay

## 2013-04-15 ENCOUNTER — Emergency Department (HOSPITAL_COMMUNITY): Payer: Self-pay

## 2013-04-15 DIAGNOSIS — I252 Old myocardial infarction: Secondary | ICD-10-CM | POA: Insufficient documentation

## 2013-04-15 DIAGNOSIS — R0602 Shortness of breath: Secondary | ICD-10-CM | POA: Insufficient documentation

## 2013-04-15 DIAGNOSIS — R5383 Other fatigue: Secondary | ICD-10-CM

## 2013-04-15 DIAGNOSIS — Z9109 Other allergy status, other than to drugs and biological substances: Secondary | ICD-10-CM | POA: Insufficient documentation

## 2013-04-15 DIAGNOSIS — F32A Depression, unspecified: Secondary | ICD-10-CM | POA: Diagnosis present

## 2013-04-15 DIAGNOSIS — Z91013 Allergy to seafood: Secondary | ICD-10-CM | POA: Insufficient documentation

## 2013-04-15 DIAGNOSIS — I2510 Atherosclerotic heart disease of native coronary artery without angina pectoris: Secondary | ICD-10-CM | POA: Diagnosis present

## 2013-04-15 DIAGNOSIS — Z9119 Patient's noncompliance with other medical treatment and regimen: Secondary | ICD-10-CM | POA: Insufficient documentation

## 2013-04-15 DIAGNOSIS — R42 Dizziness and giddiness: Secondary | ICD-10-CM | POA: Insufficient documentation

## 2013-04-15 DIAGNOSIS — R079 Chest pain, unspecified: Secondary | ICD-10-CM

## 2013-04-15 DIAGNOSIS — F319 Bipolar disorder, unspecified: Secondary | ICD-10-CM | POA: Insufficient documentation

## 2013-04-15 DIAGNOSIS — R112 Nausea with vomiting, unspecified: Secondary | ICD-10-CM | POA: Insufficient documentation

## 2013-04-15 DIAGNOSIS — L74519 Primary focal hyperhidrosis, unspecified: Secondary | ICD-10-CM | POA: Insufficient documentation

## 2013-04-15 DIAGNOSIS — Z7982 Long term (current) use of aspirin: Secondary | ICD-10-CM | POA: Insufficient documentation

## 2013-04-15 DIAGNOSIS — R5381 Other malaise: Secondary | ICD-10-CM | POA: Insufficient documentation

## 2013-04-15 DIAGNOSIS — Z91199 Patient's noncompliance with other medical treatment and regimen due to unspecified reason: Secondary | ICD-10-CM | POA: Insufficient documentation

## 2013-04-15 DIAGNOSIS — Z9861 Coronary angioplasty status: Secondary | ICD-10-CM

## 2013-04-15 DIAGNOSIS — F1910 Other psychoactive substance abuse, uncomplicated: Secondary | ICD-10-CM | POA: Diagnosis present

## 2013-04-15 DIAGNOSIS — F17200 Nicotine dependence, unspecified, uncomplicated: Secondary | ICD-10-CM | POA: Insufficient documentation

## 2013-04-15 DIAGNOSIS — I10 Essential (primary) hypertension: Secondary | ICD-10-CM | POA: Diagnosis present

## 2013-04-15 DIAGNOSIS — F329 Major depressive disorder, single episode, unspecified: Secondary | ICD-10-CM | POA: Diagnosis present

## 2013-04-15 DIAGNOSIS — E785 Hyperlipidemia, unspecified: Secondary | ICD-10-CM | POA: Diagnosis present

## 2013-04-15 DIAGNOSIS — Z79899 Other long term (current) drug therapy: Secondary | ICD-10-CM | POA: Insufficient documentation

## 2013-04-15 DIAGNOSIS — R0789 Other chest pain: Principal | ICD-10-CM | POA: Insufficient documentation

## 2013-04-15 DIAGNOSIS — E7800 Pure hypercholesterolemia, unspecified: Secondary | ICD-10-CM | POA: Insufficient documentation

## 2013-04-15 LAB — CBC
HCT: 48.9 % (ref 39.0–52.0)
Hemoglobin: 18.2 g/dL — ABNORMAL HIGH (ref 13.0–17.0)
MCH: 32.2 pg (ref 26.0–34.0)
MCHC: 37.2 g/dL — ABNORMAL HIGH (ref 30.0–36.0)
MCV: 86.4 fL (ref 78.0–100.0)
PLATELETS: 279 10*3/uL (ref 150–400)
RBC: 5.66 MIL/uL (ref 4.22–5.81)
RDW: 14.4 % (ref 11.5–15.5)
WBC: 7 10*3/uL (ref 4.0–10.5)

## 2013-04-15 LAB — BASIC METABOLIC PANEL
BUN: 16 mg/dL (ref 6–23)
CALCIUM: 9.7 mg/dL (ref 8.4–10.5)
CO2: 21 mEq/L (ref 19–32)
CREATININE: 1.07 mg/dL (ref 0.50–1.35)
Chloride: 86 mEq/L — ABNORMAL LOW (ref 96–112)
GFR calc Af Amer: >90 mL/min (ref >90)
GFR calc non Af Amer: 88 mL/min — ABNORMAL LOW (ref >90)
Glucose, Bld: 81 mg/dL (ref 70–99)
Potassium: 3.5 mEq/L — ABNORMAL LOW (ref 3.7–5.3)
Sodium: 131 mEq/L — ABNORMAL LOW (ref 137–147)

## 2013-04-15 LAB — LIPASE, BLOOD: Lipase: 20 U/L (ref 11–59)

## 2013-04-15 LAB — I-STAT TROPONIN, ED: Troponin i, poc: 0.13 ng/mL (ref 0.00–0.08)

## 2013-04-15 LAB — PRO B NATRIURETIC PEPTIDE: Pro B Natriuretic peptide (BNP): 48.2 pg/mL (ref 0–125)

## 2013-04-15 LAB — TROPONIN I: Troponin I: <0.3 ng/mL (ref <0.30)

## 2013-04-15 MED ORDER — NITROGLYCERIN 0.4 MG SL SUBL
0.4000 mg | SUBLINGUAL_TABLET | SUBLINGUAL | Status: AC | PRN
  Administered 2013-04-15 (×3): 0.4 mg via SUBLINGUAL
  Filled 2013-04-15: qty 1

## 2013-04-15 MED ORDER — SODIUM CHLORIDE 0.9 % IV BOLUS (SEPSIS)
1000.0000 mL | Freq: Once | INTRAVENOUS | Status: AC
  Administered 2013-04-15: 1000 mL via INTRAVENOUS

## 2013-04-15 MED ORDER — ONDANSETRON HCL 4 MG/2ML IJ SOLN
4.0000 mg | Freq: Once | INTRAMUSCULAR | Status: AC
  Administered 2013-04-15: 4 mg via INTRAVENOUS
  Filled 2013-04-15: qty 2

## 2013-04-15 MED ORDER — IOHEXOL 350 MG/ML SOLN
100.0000 mL | Freq: Once | INTRAVENOUS | Status: AC | PRN
  Administered 2013-04-15: 100 mL via INTRAVENOUS

## 2013-04-15 MED ORDER — ASPIRIN 81 MG PO CHEW
324.0000 mg | CHEWABLE_TABLET | Freq: Once | ORAL | Status: AC
  Administered 2013-04-15: 324 mg via ORAL
  Filled 2013-04-15: qty 4

## 2013-04-15 NOTE — ED Notes (Signed)
Pt states he is chest pain free.  

## 2013-04-15 NOTE — ED Provider Notes (Signed)
TIME SEEN: 9:30 PM  CHIEF COMPLAINT: Chest pain, shortness of breath, nausea vomiting, weakness  HPI: Patient is a 38 year old male with a history of hypertension, hyperlipidemia, bipolar disorder, history of marijuana use, tobacco abuse who presents emergency department with 3-4 days of intermittent episodes of chest pain with associated shortness of breath, nausea vomiting, diaphoresis and lightheadedness. He also states he is still very weak and fatigued. He has had a stent to his LAD in 2011 by [facility] cardiology. He states that his symptoms are similar to his prior MI. He states he feels that someone is sitting on his chest. There is no radiation of his pain. He denies any cocaine, crack or methamphetamine use. No fevers or cough. No history of PE or DVT. No known history of aortic aneurysm.  ROS: See HPI Constitutional: no fever  Eyes: no drainage  ENT: no runny nose   Cardiovascular:  no chest pain  Resp: no SOB  GI: no vomiting GU: no dysuria Integumentary: no rash  Allergy: no hives  Musculoskeletal: no leg swelling  Neurological: no slurred speech ROS otherwise negative  PAST MEDICAL HISTORY/PAST SURGICAL HISTORY:  Past Medical History  Diagnosis Date  . MI, old 2009  . Bipolar 1 disorder   . Hypertension   . High cholesterol   . Normal cardiac stress test 2012    normal myoview  . Hx of medication noncompliance   . Polysubstance abuse     MEDICATIONS:  Prior to Admission medications   Medication Sig Start Date End Date Taking? Authorizing Provider  ARIPiprazole (ABILIFY) 10 MG tablet Take 10 mg by mouth daily.    Historical Provider, MD  aspirin 81 MG tablet Take 81 mg by mouth daily.    Historical Provider, MD  carbamazepine (TEGRETOL) 200 MG tablet Take 200 mg by mouth every morning.    Historical Provider, MD  carbamazepine (TEGRETOL) 200 MG tablet Take 400 mg by mouth at bedtime.    Historical Provider, MD  ondansetron (ZOFRAN-ODT) 8 MG disintegrating tablet  Take 1 tablet (8 mg total) by mouth every 8 (eight) hours as needed for nausea or vomiting. 02/06/13   Celene KrasJon R Knapp, MD    ALLERGIES:  Allergies  Allergen Reactions  . Iodine Shortness Of Breath and Swelling  . Shrimp [Shellfish Allergy] Anaphylaxis    SOCIAL HISTORY:  History  Substance Use Topics  . Smoking status: Current Every Day Smoker -- 0.50 packs/day    Types: Cigarettes  . Smokeless tobacco: Not on file  . Alcohol Use: Yes    FAMILY HISTORY: No family history on file.  EXAM: BP 131/93  Pulse 83  Temp(Src) 98.3 F (36.8 C) (Oral)  Resp 22  Ht 5\' 5"  (1.651 m)  Wt 130 lb (58.968 kg)  BMI 21.63 kg/m2  SpO2 100% CONSTITUTIONAL: Alert and oriented and responds appropriately to questions. Patient appears uncomfortable and is mildly diaphoretic HEAD: Normocephalic EYES: Conjunctivae clear, PERRL ENT: normal nose; no rhinorrhea; moist mucous membranes; pharynx without lesions noted NECK: Supple, no meningismus, no LAD  CARD: RRR; S1 and S2 appreciated; no murmurs, no clicks, no rubs, no gallops RESP: Normal chest excursion without splinting or tachypnea; breath sounds clear and equal bilaterally; no wheezes, no rhonchi, no rales,  ABD/GI: Normal bowel sounds; non-distended; soft, very mild tenderness to palpation diffusely across his abdomen without guarding or rebound, no peritoneal signs, no pulsatile mass BACK:  The back appears normal and is non-tender to palpation, there is no CVA tenderness EXT: Normal ROM  in all joints; non-tender to palpation; no edema; normal capillary refill; no cyanosis    SKIN: Normal color for age and race; warm NEURO: Moves all extremities equally PSYCH: The patient's mood and manner are appropriate. Grooming and personal hygiene are appropriate.  MEDICAL DECISION MAKING: Patient here with her history of cardiac disease who presents emergency room with chest pain that feels like his prior MI. His last cardiac catheterization was in 2011. He  has had a negative stress test in 2012 per our records. After his MI he had a ejection fraction of 20-30% this improved to 60%.  Concern for possible MI versus dissection versus PE as patient does appear very comfortable on exam. Will obtain cardiac labs, CT of his chest, abdomen and pelvis. We'll give nitroglycerin to get patient pain-free.  ED PROGRESS: Patient is pain-free after 3 nitroglycerin tablets. His i-STAT troponin is slightly elevated at 0.13. BNP is normal. CT of his chest, abdomen and pelvis shows no pulmonary embolus, dissection, edema. Given patient has a history of cardiac disease and had initial positive troponin, will discuss with cardiology. We'll give aspirin.  12:00 AM  Spoke with Dr. Jon Billings with cardiology who will see the patient in the emergency department.  12:50 AM  Pt is still pain-free and hemodynamically stable. Cardiology recommends medicine admission for rule out with cardiology consult.   EKG Interpretation  Date/Time:  Sunday April 15 2013 21:15:31 EDT Ventricular Rate:  89 PR Interval:  126 QRS Duration: 114 QT Interval:  382 QTC Calculation: 465 R Axis:   77 Text Interpretation:  Sinus rhythm Incomplete right bundle branch block ST elev, probable normal early repol pattern No significant change since last tracing Confirmed by Jailynne Opperman,  DO, Antanisha Mohs [phone]) on 04/15/2013 9:29:46 PM          Layla Maw Shenia Alan, DO 04/16/13 6045

## 2013-04-15 NOTE — ED Notes (Signed)
PT states unable to void at this time. PT states unable to void for 3 days

## 2013-04-15 NOTE — ED Notes (Signed)
Pt states that he is comfortable and its the first time he is able to sleep

## 2013-04-15 NOTE — ED Notes (Signed)
Pt arrives via EMS c/o nausea/vomitting/weakness x 5 days. BP 138/106 HR 104. Upon arrival to the ED pt c/o chest pain. Pt has hx 2 MI's with stents. When asked pt regarding chest pain pt states he "thinks" he has chest pain, "i cant even think straight right now."

## 2013-04-15 NOTE — ED Notes (Signed)
Pt taken to CT.

## 2013-04-16 ENCOUNTER — Encounter (HOSPITAL_COMMUNITY): Payer: Self-pay | Admitting: *Deleted

## 2013-04-16 DIAGNOSIS — E785 Hyperlipidemia, unspecified: Secondary | ICD-10-CM

## 2013-04-16 DIAGNOSIS — R079 Chest pain, unspecified: Secondary | ICD-10-CM

## 2013-04-16 LAB — RAPID URINE DRUG SCREEN, HOSP PERFORMED
Amphetamines: NOT DETECTED
BARBITURATES: NOT DETECTED
Benzodiazepines: NOT DETECTED
Cocaine: NOT DETECTED
Opiates: NOT DETECTED
Tetrahydrocannabinol: POSITIVE — AB

## 2013-04-16 LAB — URINE MICROSCOPIC-ADD ON

## 2013-04-16 LAB — URINALYSIS, ROUTINE W REFLEX MICROSCOPIC
GLUCOSE, UA: NEGATIVE mg/dL
Ketones, ur: >80 mg/dL — AB
LEUKOCYTES UA: NEGATIVE
NITRITE: NEGATIVE
PH: 5.5 (ref 5.0–8.0)
Protein, ur: 30 mg/dL — AB
SPECIFIC GRAVITY, URINE: 1.015 (ref 1.005–1.030)
Urobilinogen, UA: 1 mg/dL (ref 0.0–1.0)

## 2013-04-16 LAB — TROPONIN I
Troponin I: <0.3 ng/mL (ref <0.30)
Troponin I: <0.3 ng/mL (ref <0.30)

## 2013-04-16 MED ORDER — ONDANSETRON HCL 4 MG PO TABS: 4.0000 mg | ORAL_TABLET | Freq: Three times a day (TID) | ORAL | Status: DC | PRN

## 2013-04-16 MED ORDER — FAMOTIDINE 20 MG PO TABS: 20.0000 mg | ORAL_TABLET | Freq: Two times a day (BID) | ORAL | Status: DC

## 2013-04-16 MED ORDER — CARBAMAZEPINE 200 MG PO TABS
200.0000 mg | ORAL_TABLET | Freq: Every morning | ORAL | Status: DC
  Administered 2013-04-16: 200 mg via ORAL
  Filled 2013-04-16: qty 1

## 2013-04-16 MED ORDER — ARIPIPRAZOLE 10 MG PO TABS
10.0000 mg | ORAL_TABLET | Freq: Every day | ORAL | Status: DC
  Administered 2013-04-16: 10 mg via ORAL
  Filled 2013-04-16: qty 1

## 2013-04-16 MED ORDER — HEPARIN SODIUM (PORCINE) 5000 UNIT/ML IJ SOLN
5000.0000 [IU] | Freq: Three times a day (TID) | INTRAMUSCULAR | Status: DC
  Administered 2013-04-16: 5000 [IU] via SUBCUTANEOUS
  Filled 2013-04-16 (×4): qty 1

## 2013-04-16 MED ORDER — ONDANSETRON HCL 4 MG/2ML IJ SOLN
4.0000 mg | Freq: Four times a day (QID) | INTRAMUSCULAR | Status: DC | PRN
  Administered 2013-04-16: 4 mg via INTRAVENOUS
  Filled 2013-04-16: qty 2

## 2013-04-16 MED ORDER — SODIUM CHLORIDE 0.9 % IV SOLN
INTRAVENOUS | Status: DC
  Administered 2013-04-16: 125 mL/h via INTRAVENOUS

## 2013-04-16 MED ORDER — POTASSIUM CHLORIDE CRYS ER 20 MEQ PO TBCR
40.0000 meq | EXTENDED_RELEASE_TABLET | Freq: Once | ORAL | Status: AC
Start: 2013-04-16 — End: 2013-04-16
  Administered 2013-04-16: 40 meq via ORAL
  Filled 2013-04-16: qty 2

## 2013-04-16 MED ORDER — ASPIRIN EC 81 MG PO TBEC
81.0000 mg | DELAYED_RELEASE_TABLET | Freq: Every day | ORAL | Status: DC
  Administered 2013-04-16: 81 mg via ORAL
  Filled 2013-04-16: qty 1

## 2013-04-16 MED ORDER — CARBAMAZEPINE 200 MG PO TABS
400.0000 mg | ORAL_TABLET | Freq: Every day | ORAL | Status: DC
  Filled 2013-04-16 (×2): qty 2

## 2013-04-16 MED ORDER — ACETAMINOPHEN 325 MG PO TABS: 650.0000 mg | ORAL_TABLET | ORAL | Status: DC | PRN

## 2013-04-16 NOTE — Discharge Summary (Deleted)
Discharge Summary   Patient ID: [name] MRN: [id number], DOB/AGE: 03/04/1975 38 y.o. Admit date: 04/15/2013 D/C date:     04/16/2013  Primary Cardiologist: Dr. Tresa Endo  Principal Problem:   Chest pain Active Problems:   HYPERLIPIDEMIA   SUBSTANCE ABUSE   DEPRESSION   HYPERTENSION   CORONARY ARTERY DISEASE   HLD (hyperlipidemia)   Admission Dates: 04/15/13 - 04/16/13 Discharge Diagnosis: Non cardiac chest pain  HPI: [name] is a 38 y.o. male with history of PCI w/ DES to RCA after an inferior wall STEMI in 2009, HTN, HLD, bipolar/substance abuse and non-compliance who presented to [facility] ED on 04/15/13 with several days of persistent N/V/D associated with chest pressure. His chest pain was not reminiscent of his previous cardiac chest pain with his STEMI.  Hospital course: The patient was admitted for observation. He ruled out for MI with negative serial cardiac enzymes and EKG with no acute ST or TW changes. His drug screen was remarkable for THC, but negative for cocaine and amphetamines. A CTA of the chest and abdomen were negative for dissection and anyeurism. He was also found to be mildly hypokalemic which was likely secondary to n/v. This was repleted with oral supplementation.  The patient has had an uncomplicated hospital course and is recovering well. He has been seen by Dr. Allyson Sabal today and deemed stable for discharge home. He will follow up with Dr. Tresa Endo as he did his PCI in 2009. Smoking cessation was disscussed in length. Discharge medications include aspirin and his psychiatric medications.     Discharge Vitals: Blood pressure 125/84, pulse 59, temperature 98.5 F (36.9 C), temperature source Oral, resp. rate 16, height 5\' 5"  (1.651 m), weight 130 lb (58.968 kg), SpO2 100.00%.  Labs: Lab Results  Component Value Date   WBC 7.0 04/15/2013   HGB 18.2* 04/15/2013   HCT 48.9 04/15/2013   MCV 86.4 04/15/2013   PLT 279 04/15/2013     Recent Labs Lab 04/15/13 2124  NA  131*  K 3.5*  CL 86*  CO2 21  BUN 16  CREATININE 1.07  CALCIUM 9.7  GLUCOSE 81    Recent Labs  04/15/13 2143 04/16/13 0335 04/16/13 0714  TROPONINI <0.30 <0.30 <0.30     Diagnostic Studies/Procedures   Dg Chest Port 1 View  04/15/2013   CLINICAL DATA:  Chest pain and nausea.  EXAM: PORTABLE CHEST - 1 VIEW  COMPARISON:  02/05/2013  FINDINGS: Lungs are adequately inflated and otherwise clear. Cardiomediastinal silhouette and remainder the exam is unchanged. Remainder the exam is unchanged.  IMPRESSION: No active disease.   Electronically Signed   By: Elberta Fortis M.D.   On: 04/15/2013 21:48   Ct Angio Chest Aortic Dissect W &/or W/o  04/15/2013   CLINICAL DATA:  Nausea, vomiting and weakness for 5 days. Rule out dissection.  EXAM: CT ANGIOGRAPHY CHEST, ABDOMEN AND PELVIS  TECHNIQUE: Multidetector CT imaging through the chest, abdomen and pelvis was performed using the standard protocol during bolus administration of intravenous contrast. Multiplanar reconstructed images and MIPs were obtained and reviewed to evaluate the vascular anatomy.  CONTRAST:  OMNIPAQUE IOHEXOL 350 MG/ML SOLN  COMPARISON:  None.  FINDINGS: CTA CHEST FINDINGS  The lungs are clear. Heart is normal in size. Thoracic aorta is unremarkable without evidence of aneurysm or dissection. Remaining mediastinal structures are unremarkable.  Review of the MIP images confirms the above findings.  CTA ABDOMEN AND PELVIS FINDINGS  The liver, spleen, pancreas,  gallbladder and adrenal glands are within normal. Kidneys are normal. Abdominal aorta is normal in caliber without evidence of aneurysm or dissection. The celiac axis and mesenteric arteries are patent. Renal arteries are within normal. Iliac arteries are normal. There is no free fluid or focal inflammatory change present. Appendix is unremarkable.  Pelvic images demonstrate a normal bladder, prostate and rectosigmoid colon. Remaining bones and soft tissues are unremarkable.   Review of the MIP images confirms the above findings.  IMPRESSION: No evidence of aneurysm or dissection involving the thoracoabdominal aorta.  No acute findings in the chest, abdomen or pelvis.   Electronically Signed   By: Elberta Fortisaniel  Boyle M.D.   On: 04/15/2013 23:39   Ct Cta Abd/pel W/cm &/or W/o Cm  04/15/2013   CLINICAL DATA:  Nausea, vomiting and weakness for 5 days. Rule out dissection.  EXAM: CT ANGIOGRAPHY CHEST, ABDOMEN AND PELVIS  TECHNIQUE: Multidetector CT imaging through the chest, abdomen and pelvis was performed using the standard protocol during bolus administration of intravenous contrast. Multiplanar reconstructed images and MIPs were obtained and reviewed to evaluate the vascular anatomy.  CONTRAST:  100mL OMNIPAQUE IOHEXOL 350 MG/ML SOLN  COMPARISON:  None.  FINDINGS: CTA CHEST FINDINGS  The lungs are clear. Heart is normal in size. Thoracic aorta is unremarkable without evidence of aneurysm or dissection. Remaining mediastinal structures are unremarkable.  Review of the MIP images confirms the above findings.  CTA ABDOMEN AND PELVIS FINDINGS  The liver, spleen, pancreas, gallbladder and adrenal glands are within normal. Kidneys are normal. Abdominal aorta is normal in caliber without evidence of aneurysm or dissection. The celiac axis and mesenteric arteries are patent. Renal arteries are within normal. Iliac arteries are normal. There is no free fluid or focal inflammatory change present. Appendix is unremarkable.  Pelvic images demonstrate a normal bladder, prostate and rectosigmoid colon. Remaining bones and soft tissues are unremarkable.  Review of the MIP images confirms the above findings.  IMPRESSION: No evidence of aneurysm or dissection involving the thoracoabdominal aorta.  No acute findings in the chest, abdomen or pelvis.   Electronically Signed   By: Elberta Fortisaniel  Boyle M.D.   On: 04/15/2013 23:39    Discharge Medications     Medication List         ARIPiprazole 10 MG tablet    Commonly known as:  ABILIFY  Take 10 mg by mouth daily.     aspirin 81 MG tablet  Take 81 mg by mouth daily.     carbamazepine 200 MG tablet  Commonly known as:  TEGRETOL  Take 200-400 mg by mouth 2 (two) times daily. 200mg  in AM, 400mg  QHS     famotidine 20 MG tablet  Commonly known as:  PEPCID  Take 1 tablet (20 mg total) by mouth 2 (two) times daily.     ondansetron 4 MG tablet  Commonly known as:  ZOFRAN  Take 1 tablet (4 mg total) by mouth every 8 (eight) hours as needed for nausea or vomiting.        Disposition   The patient will be discharged in stable condition to home.  Follow-up Information   Follow up with Lennette BihariKELLY,THOMAS A, MD On 04/30/2013. (The office will call to make an appointment with the Dr. or a NP or PA in the next two weeks to 1 month. Please follow up with cardiology as directed.)    Specialty:  Cardiology   Contact information:   [address] [place] [address] [phone]  Duration of Discharge Encounter: Greater than 30 minutes including physician and PA time.  SignedVenetia Maxon, Kimyetta Flott PA-C 04/16/2013, 12:45 PM

## 2013-04-16 NOTE — H&P (Signed)
[facility] History and Physical  [name] ZOX:[id number] DOB: January 10, 1976 DOA: 04/15/2013  Referring physician: EDP PCP: No PCP Per Patient   Chief Complaint: Chest pain   HPI: [name] is a 38 y.o. male who presents to the ED with chest pain.  Pain is intermittent for the past 3-4 days, associated with SOB, N/V, and lightheadedness.  Has history of STEMI in past with stent to LAD in 2011 by [place] cards.  Quality is like someone sitting on his chest.  Review of Systems: Systems reviewed.  As above, otherwise negative  Past Medical History  Diagnosis Date  . MI, old 2009  . Bipolar 1 disorder   . Hypertension   . High cholesterol   . Normal cardiac stress test 2012    normal myoview  . Hx of medication noncompliance   . Polysubstance abuse    Past Surgical History  Procedure Laterality Date  . Femoral artery stent    . Coronary angioplasty with stent placement  2009   Social History:  reports that he has been smoking Cigarettes.  He has been smoking about 0.50 packs per day. He does not have any smokeless tobacco history on file. He reports that he drinks alcohol. He reports that he uses illicit drugs (Marijuana).  Allergies  Allergen Reactions  . Iodine Shortness Of Breath and Swelling  . Shrimp [Shellfish Allergy] Anaphylaxis    No family history on file.   Prior to Admission medications   Medication Sig Start Date End Date Taking? Authorizing Provider  ARIPiprazole (ABILIFY) 10 MG tablet Take 10 mg by mouth daily.   Yes Historical Provider, MD  aspirin 81 MG tablet Take 81 mg by mouth daily.   Yes Historical Provider, MD  carbamazepine (TEGRETOL) 200 MG tablet Take 200 mg by mouth every morning.   Yes Historical Provider, MD  carbamazepine (TEGRETOL) 200 MG tablet Take 400 mg by mouth at bedtime.   Yes Historical Provider, MD   Physical Exam: Filed Vitals:   04/16/13 0015  BP: 118/74  Pulse: 58  Temp:   Resp: 12    BP 118/74  Pulse 58   Temp(Src) 98.3 F (36.8 C) (Oral)  Resp 12  Ht 5\' 5"  (1.651 m)  Wt 58.968 kg (130 lb)  BMI 21.63 kg/m2  SpO2 95%  General Appearance:    Alert, oriented, no distress, appears stated age  Head:    Normocephalic, atraumatic  Eyes:    PERRL, EOMI, sclera non-icteric        Nose:   Nares without drainage or epistaxis. Mucosa, turbinates normal  Throat:   Moist mucous membranes. Oropharynx without erythema or exudate.  Neck:   Supple. No carotid bruits.  No thyromegaly.  No lymphadenopathy.   Back:     No CVA tenderness, no spinal tenderness  Lungs:     Clear to auscultation bilaterally, without wheezes, rhonchi or rales  Chest wall:    No tenderness to palpitation  Heart:    Regular rate and rhythm without murmurs, gallops, rubs  Abdomen:     Soft, non-tender, nondistended, normal bowel sounds, no organomegaly  Genitalia:    deferred  Rectal:    deferred  Extremities:   No clubbing, cyanosis or edema.  Pulses:   2+ and symmetric all extremities  Skin:   Skin color, texture, turgor normal, no rashes or lesions  Lymph nodes:   Cervical, supraclavicular, and axillary nodes normal  Neurologic:   CNII-XII intact. Normal strength, sensation and  reflexes      throughout    Labs on Admission:  Basic Metabolic Panel:  Recent Labs Lab 04/15/13 2124  NA 131*  K 3.5*  CL 86*  CO2 21  GLUCOSE 81  BUN 16  CREATININE 1.07  CALCIUM 9.7   Liver Function Tests: No results found for this basename: AST, ALT, ALKPHOS, BILITOT, PROT, ALBUMIN,  in the last 168 hours  Recent Labs Lab 04/15/13 2124  LIPASE 20   No results found for this basename: AMMONIA,  in the last 168 hours CBC:  Recent Labs Lab 04/15/13 2124  WBC 7.0  HGB 18.2*  HCT 48.9  MCV 86.4  PLT 279   Cardiac Enzymes:  Recent Labs Lab 04/15/13 2143  TROPONINI <0.30    BNP (last 3 results)  Recent Labs  04/15/13 2124  PROBNP 48.2   CBG: No results found for this basename: GLUCAP,  in the last 168  hours  Radiological Exams on Admission: Dg Chest Port 1 View  04/15/2013   CLINICAL DATA:  Chest pain and nausea.  EXAM: PORTABLE CHEST - 1 VIEW  COMPARISON:  02/05/2013  FINDINGS: Lungs are adequately inflated and otherwise clear. Cardiomediastinal silhouette and remainder the exam is unchanged. Remainder the exam is unchanged.  IMPRESSION: No active disease.   Electronically Signed   By: Elberta Fortisaniel  Boyle M.D.   On: 04/15/2013 21:48   Ct Angio Chest Aortic Dissect W &/or W/o  04/15/2013   CLINICAL DATA:  Nausea, vomiting and weakness for 5 days. Rule out dissection.  EXAM: CT ANGIOGRAPHY CHEST, ABDOMEN AND PELVIS  TECHNIQUE: Multidetector CT imaging through the chest, abdomen and pelvis was performed using the standard protocol during bolus administration of intravenous contrast. Multiplanar reconstructed images and MIPs were obtained and reviewed to evaluate the vascular anatomy.  CONTRAST:  100mL OMNIPAQUE IOHEXOL 350 MG/ML SOLN  COMPARISON:  None.  FINDINGS: CTA CHEST FINDINGS  The lungs are clear. Heart is normal in size. Thoracic aorta is unremarkable without evidence of aneurysm or dissection. Remaining mediastinal structures are unremarkable.  Review of the MIP images confirms the above findings.  CTA ABDOMEN AND PELVIS FINDINGS  The liver, spleen, pancreas, gallbladder and adrenal glands are within normal. Kidneys are normal. Abdominal aorta is normal in caliber without evidence of aneurysm or dissection. The celiac axis and mesenteric arteries are patent. Renal arteries are within normal. Iliac arteries are normal. There is no free fluid or focal inflammatory change present. Appendix is unremarkable.  Pelvic images demonstrate a normal bladder, prostate and rectosigmoid colon. Remaining bones and soft tissues are unremarkable.  Review of the MIP images confirms the above findings.  IMPRESSION: No evidence of aneurysm or dissection involving the thoracoabdominal aorta.  No acute findings in the chest,  abdomen or pelvis.   Electronically Signed   By: Elberta Fortisaniel  Boyle M.D.   On: 04/15/2013 23:39   Ct Cta Abd/pel W/cm &/or W/o Cm  04/15/2013   CLINICAL DATA:  Nausea, vomiting and weakness for 5 days. Rule out dissection.  EXAM: CT ANGIOGRAPHY CHEST, ABDOMEN AND PELVIS  TECHNIQUE: Multidetector CT imaging through the chest, abdomen and pelvis was performed using the standard protocol during bolus administration of intravenous contrast. Multiplanar reconstructed images and MIPs were obtained and reviewed to evaluate the vascular anatomy.  CONTRAST:  100mL OMNIPAQUE IOHEXOL 350 MG/ML SOLN  COMPARISON:  None.  FINDINGS: CTA CHEST FINDINGS  The lungs are clear. Heart is normal in size. Thoracic aorta is unremarkable without evidence of aneurysm or  dissection. Remaining mediastinal structures are unremarkable.  Review of the MIP images confirms the above findings.  CTA ABDOMEN AND PELVIS FINDINGS  The liver, spleen, pancreas, gallbladder and adrenal glands are within normal. Kidneys are normal. Abdominal aorta is normal in caliber without evidence of aneurysm or dissection. The celiac axis and mesenteric arteries are patent. Renal arteries are within normal. Iliac arteries are normal. There is no free fluid or focal inflammatory change present. Appendix is unremarkable.  Pelvic images demonstrate a normal bladder, prostate and rectosigmoid colon. Remaining bones and soft tissues are unremarkable.  Review of the MIP images confirms the above findings.  IMPRESSION: No evidence of aneurysm or dissection involving the thoracoabdominal aorta.  No acute findings in the chest, abdomen or pelvis.   Electronically Signed   By: Elberta Fortis M.D.   On: 04/15/2013 23:39    EKG: Independently reviewed.  Assessment/Plan Principal Problem:   Chest pain Active Problems:   CORONARY ARTERY DISEASE   1. Chest pain - in patient with known history of CAD and LAD stent despite his very young age.  Last stress test in 2012.   Admitting for serial troponins, cards to eval in AM.  Patient NPO for possible stress test.    Code Status: Full Code  Family Communication: Family at bedside Disposition Plan: Admit to obs   Time spent: 50 min  Viktoriya Glaspy M. [facility] Pager [phone]  If 7AM-7PM, please contact the day team taking care of the patient [email or web address] Password [email or web address] 04/16/2013, 1:15 AM

## 2013-04-16 NOTE — Discharge Summary (Addendum)
Physician Discharge Summary  [name] ZOX:[id number] DOB: 1975-11-24 DOA: 04/15/2013  PCP: No PCP Per Patient  Admit date: 04/15/2013 Discharge date: 04/16/2013  Time spent: 20 minutes  Recommendations for Outpatient Follow-up:  1. D/c home with outpt cardiology follow up  Discharge Diagnoses:  Principal Problem:   Chest pain  Active Problems:   SUBSTANCE ABUSE   DEPRESSION   HYPERTENSION   CORONARY ARTERY DISEASE   Discharge Condition: fair  Diet recommendation: cardiac  Filed Weights   04/15/13 2116 04/16/13 0208  Weight: 58.968 kg (130 lb) 58.968 kg (130 lb)    History of present illness:  38 y.o. male who presents to the ED with chest pain. Pain is intermittent for the past 3-4 days, associated with SOB, N/V, and lightheadedness. Has history of STEMI in past with stent to LAD in 2011 by [employer] [facility].  Initial troponin and EKG unremarkable. Patient  admitted to tele for r/o ACS.  Hospital Course:  Chest pain  patient stable on telemetry. Serial troponin and EKG unremarkable. patient reports several episodes of nausea and vomiting for past 3-4 days . No further chest pain symptoms and this could be related to epigastric discomfort from vomiting.  CT chest done on admission with concern for aortic dissection was negative. CT abdomen and pelvis also done and was unremarkable. Possibly related to viral gastroenteritis vs cyclical vomiting with marijuana abuse. Placed on IV fluids and has no further GI  symptoms. Seen by cardiology and recommend symptoms to be  Non cardiac and no inpatient w/up needed. Has follow up with cardiology in 2 weeks. continue home meds.  counseled on marijuana cessation. Patient stable for discharge. i will prescribe him with a short course of prn zofran and pepcid for his GI symptoms.   Procedures: none Consultations:  cardiology  Discharge Exam: Filed Vitals:   04/16/13 0808  BP: 125/84  Pulse: 59  Temp: 98.5 F (36.9 C)  Resp: 16     General: middle aged male in NAD HEENT: no pallor, moist mucosa, no icterus Chest: clear b/l, no added ounds CVS: ns1&s2, No murmurs Abd: soft, NT, ND, BS+ EXT: warm, no edema  CNS: AAOX3   Discharge Instructions You were cared for by a hospitalist during your hospital stay. If you have any questions about your discharge medications or the care you received while you were in the hospital after you are discharged, you can call the unit and asked to speak with the hospitalist on call if the hospitalist that took care of you is not available. Once you are discharged, your primary care physician will handle any further medical issues. Please note that NO REFILLS for any discharge medications will be authorized once you are discharged, as it is imperative that you return to your primary care physician (or establish a relationship with a primary care physician if you do not have one) for your aftercare needs so that they can reassess your need for medications and monitor your lab values.     Medication List         ARIPiprazole 10 MG tablet  Commonly known as:  ABILIFY  Take 10 mg by mouth daily.     aspirin 81 MG tablet  Take 81 mg by mouth daily.     carbamazepine 200 MG tablet  Commonly known as:  TEGRETOL  Take 200-400 mg by mouth 2 (two) times daily. 200mg  in AM, 400mg  QHS     Tab zofran 4 mg , 1 tablet 3 times  daily as needed for nausea  tab pepcid 20 mg 1 tab po bid for 10 days   Allergies  Allergen Reactions  . Iodine Shortness Of Breath and Swelling  . Shrimp [Shellfish Allergy] Anaphylaxis       Follow-up Information   Follow up with Lennette Bihari, MD On 04/30/2013. (The office will call to make an appointment with the Dr. or a NP or PA in the next two weeks to 1 month. Please follow up with cardiology as directed.)    Specialty:  Cardiology   Contact information:   [address] [place] [address] [phone]        The results of  significant diagnostics from this hospitalization (including imaging, microbiology, ancillary and laboratory) are listed below for reference.    Significant Diagnostic Studies: Dg Chest Port 1 View  04/15/2013   CLINICAL DATA:  Chest pain and nausea.  EXAM: PORTABLE CHEST - 1 VIEW  COMPARISON:  02/05/2013  FINDINGS: Lungs are adequately inflated and otherwise clear. Cardiomediastinal silhouette and remainder the exam is unchanged. Remainder the exam is unchanged.  IMPRESSION: No active disease.   Electronically Signed   By: Elberta Fortis M.D.   On: 04/15/2013 21:48   Ct Angio Chest Aortic Dissect W &/or W/o  04/15/2013   CLINICAL DATA:  Nausea, vomiting and weakness for 5 days. Rule out dissection.  EXAM: CT ANGIOGRAPHY CHEST, ABDOMEN AND PELVIS  TECHNIQUE: Multidetector CT imaging through the chest, abdomen and pelvis was performed using the standard protocol during bolus administration of intravenous contrast. Multiplanar reconstructed images and MIPs were obtained and reviewed to evaluate the vascular anatomy.  CONTRAST:  OMNIPAQUE IOHEXOL 350 MG/ML SOLN  COMPARISON:  None.  FINDINGS: CTA CHEST FINDINGS  The lungs are clear. Heart is normal in size. Thoracic aorta is unremarkable without evidence of aneurysm or dissection. Remaining mediastinal structures are unremarkable.  Review of the MIP images confirms the above findings.  CTA ABDOMEN AND PELVIS FINDINGS  The liver, spleen, pancreas, gallbladder and adrenal glands are within normal. Kidneys are normal. Abdominal aorta is normal in caliber without evidence of aneurysm or dissection. The celiac axis and mesenteric arteries are patent. Renal arteries are within normal. Iliac arteries are normal. There is no free fluid or focal inflammatory change present. Appendix is unremarkable.  Pelvic images demonstrate a normal bladder, prostate and rectosigmoid colon. Remaining bones and soft tissues are unremarkable.  Review of the MIP images confirms the  above findings.  IMPRESSION: No evidence of aneurysm or dissection involving the thoracoabdominal aorta.  No acute findings in the chest, abdomen or pelvis.   Electronically Signed   By: Elberta Fortis M.D.   On: 04/15/2013 23:39   Ct Cta Abd/pel W/cm &/or W/o Cm  04/15/2013   CLINICAL DATA:  Nausea, vomiting and weakness for 5 days. Rule out dissection.  EXAM: CT ANGIOGRAPHY CHEST, ABDOMEN AND PELVIS  TECHNIQUE: Multidetector CT imaging through the chest, abdomen and pelvis was performed using the standard protocol during bolus administration of intravenous contrast. Multiplanar reconstructed images and MIPs were obtained and reviewed to evaluate the vascular anatomy.  CONTRAST:  OMNIPAQUE IOHEXOL 350 MG/ML SOLN  COMPARISON:  None.  FINDINGS: CTA CHEST FINDINGS  The lungs are clear. Heart is normal in size. Thoracic aorta is unremarkable without evidence of aneurysm or dissection. Remaining mediastinal structures are unremarkable.  Review of the MIP images confirms the above findings.  CTA ABDOMEN AND PELVIS FINDINGS  The liver, spleen, pancreas,  gallbladder and adrenal glands are within normal. Kidneys are normal. Abdominal aorta is normal in caliber without evidence of aneurysm or dissection. The celiac axis and mesenteric arteries are patent. Renal arteries are within normal. Iliac arteries are normal. There is no free fluid or focal inflammatory change present. Appendix is unremarkable.  Pelvic images demonstrate a normal bladder, prostate and rectosigmoid colon. Remaining bones and soft tissues are unremarkable.  Review of the MIP images confirms the above findings.  IMPRESSION: No evidence of aneurysm or dissection involving the thoracoabdominal aorta.  No acute findings in the chest, abdomen or pelvis.   Electronically Signed   By: Elberta Fortisaniel  Boyle M.D.   On: 04/15/2013 23:39    Microbiology: No results found for this or any previous visit (from the past 240 hour(s)).   Labs: Basic Metabolic  Panel:  Recent Labs Lab 04/15/13 2124  NA 131*  K 3.5*  CL 86*  CO2 21  GLUCOSE 81  BUN 16  CREATININE 1.07  CALCIUM 9.7   Liver Function Tests: No results found for this basename: AST, ALT, ALKPHOS, BILITOT, PROT, ALBUMIN,  in the last 168 hours  Recent Labs Lab 04/15/13 2124  LIPASE 20   No results found for this basename: AMMONIA,  in the last 168 hours CBC:  Recent Labs Lab 04/15/13 2124  WBC 7.0  HGB 18.2*  HCT 48.9  MCV 86.4  PLT 279   Cardiac Enzymes:  Recent Labs Lab 04/15/13 2143 04/16/13 0335 04/16/13 0714  TROPONINI <0.30 <0.30 <0.30   BNP: BNP (last 3 results)  Recent Labs  04/15/13 2124  PROBNP 48.2   CBG: No results found for this basename: GLUCAP,  in the last 168 hours     Signed:  Eddie NorthHUNGEL, Wakisha Alberts  [facility] 04/16/2013, 12:13 PM

## 2013-04-16 NOTE — Discharge Instructions (Signed)
Chest Pain (Nonspecific) °It is often hard to give a specific diagnosis for the cause of chest pain. There is always a chance that your pain could be related to something serious, such as a heart attack or a blood clot in the lungs. You need to follow up with your caregiver for further evaluation. °CAUSES  °· Heartburn. °· Pneumonia or bronchitis. °· Anxiety or stress. °· Inflammation around your heart (pericarditis) or lung (pleuritis or pleurisy). °· A blood clot in the lung. °· A collapsed lung (pneumothorax). It can develop suddenly on its own (spontaneous pneumothorax) or from injury (trauma) to the chest. °· Shingles infection (herpes zoster virus). °The chest wall is composed of bones, muscles, and cartilage. Any of these can be the source of the pain. °· The bones can be bruised by injury. °· The muscles or cartilage can be strained by coughing or overwork. °· The cartilage can be affected by inflammation and become sore (costochondritis). °DIAGNOSIS  °Lab tests or other studies, such as X-rays, electrocardiography, stress testing, or cardiac imaging, may be needed to find the cause of your pain.  °TREATMENT  °· Treatment depends on what may be causing your chest pain. Treatment may include: °· Acid blockers for heartburn. °· Anti-inflammatory medicine. °· Pain medicine for inflammatory conditions. °· Antibiotics if an infection is present. °· You may be advised to change lifestyle habits. This includes stopping smoking and avoiding alcohol, caffeine, and chocolate. °· You may be advised to keep your head raised (elevated) when sleeping. This reduces the chance of acid going backward from your stomach into your esophagus. °· Most of the time, nonspecific chest pain will improve within 2 to 3 days with rest and mild pain medicine. °HOME CARE INSTRUCTIONS  °· If antibiotics were prescribed, take your antibiotics as directed. Finish them even if you start to feel better. °· For the next few days, avoid physical  activities that bring on chest pain. Continue physical activities as directed. °· Do not smoke. °· Avoid drinking alcohol. °· Only take over-the-counter or prescription medicine for pain, discomfort, or fever as directed by your caregiver. °· Follow your caregiver's suggestions for further testing if your chest pain does not go away. °· Keep any follow-up appointments you made. If you do not go to an appointment, you could develop lasting (chronic) problems with pain. If there is any problem keeping an appointment, you must call to reschedule. °SEEK MEDICAL CARE IF:  °· You think you are having problems from the medicine you are taking. Read your medicine instructions carefully. °· Your chest pain does not go away, even after treatment. °· You develop a rash with blisters on your chest. °SEEK IMMEDIATE MEDICAL CARE IF:  °· You have increased chest pain or pain that spreads to your arm, neck, jaw, back, or abdomen. °· You develop shortness of breath, an increasing cough, or you are coughing up blood. °· You have severe back or abdominal pain, feel nauseous, or vomit. °· You develop severe weakness, fainting, or chills. °· You have a fever. °THIS IS AN EMERGENCY. Do not wait to see if the pain will go away. Get medical help at once. Call your local emergency services (911 in [place] Do not drive yourself to the hospital. °MAKE SURE YOU:  °· Understand these instructions. °· Will watch your condition. °· Will get help right away if you are not doing well or get worse. °Document Released: 10/07/2004 Document Revised: 03/22/2011 Document Reviewed: 08/03/2007 °ExitCare® Patient Information ©2014 [employer]   [employer]. ° °

## 2013-04-16 NOTE — Progress Notes (Signed)
Patient Name: [name] Date of Encounter: 04/16/2013     Principal Problem:   Chest pain Active Problems:   CORONARY ARTERY DISEASE    SUBJECTIVE   Feeling much better today. No CP or SOB. Weakness much improved.   CURRENT MEDS . ARIPiprazole  10 mg Oral Daily  . aspirin EC  81 mg Oral Daily  . carbamazepine  200 mg Oral q morning - 10a  . carbamazepine  400 mg Oral QHS  . heparin  5,000 Units Subcutaneous 3 times per day    OBJECTIVE  Filed Vitals:   04/16/13 0130 04/16/13 0208 04/16/13 0621 04/16/13 0808  BP: 110/69 126/83 124/83 125/84  Pulse: 51 65 60 59  Temp:  98.2 F (36.8 C) 98.7 F (37.1 C) 98.5 F (36.9 C)  TempSrc:  Oral Oral Oral  Resp: 19 16 16 16   Height:      Weight:  130 lb (58.968 kg)    SpO2: 95% 100% 100% 100%    Intake/Output Summary (Last 24 hours) at 04/16/13 0933 Last data filed at 04/16/13 0622  Gross per 24 hour  Intake      0 ml  Output    200 ml  Net   -200 ml   Filed Weights   04/15/13 2116 04/16/13 0208  Weight: 130 lb (58.968 kg) 130 lb (58.968 kg)    PHYSICAL EXAM  General: Pleasant, NAD. Neuro: Alert and oriented X 3. Moves all extremities spontaneously. Psych: Normal affect. HEENT:  Normal  Neck: Supple without bruits or JVD. Lungs:  Resp regular and unlabored, CTA. Heart: RRR no s3, s4, or murmurs. Abdomen: Soft, non-tender, non-distended, BS + x 4.  Extremities: No clubbing, cyanosis or edema. DP/PT/Radials 2+ and equal bilaterally.  Accessory Clinical Findings  CBC  Recent Labs  04/15/13 2124  WBC 7.0  HGB 18.2*  HCT 48.9  MCV 86.4  PLT 279   Basic Metabolic Panel  Recent Labs  04/15/13 2124  NA 131*  K 3.5*  CL 86*  CO2 21  GLUCOSE 81  BUN 16  CREATININE 1.07  CALCIUM 9.7     Recent Labs  04/15/13 2124  LIPASE 20   Cardiac Enzymes  Recent Labs  04/15/13 2143 04/16/13 0335 04/16/13 0714  TROPONINI <0.30 <0.30 <0.30   TELE  NSR, mildly bradycardic    Radiology/Studies  Dg Chest Port 1 View  04/15/2013   CLINICAL DATA:  Chest pain and nausea.  EXAM: PORTABLE CHEST - 1 VIEW  COMPARISON:  02/05/2013  FINDINGS: Lungs are adequately inflated and otherwise clear. Cardiomediastinal silhouette and remainder the exam is unchanged. Remainder the exam is unchanged.  IMPRESSION: No active disease.  Ct Angio Chest Aortic Dissect W &/or W/o  04/15/2013   CLINICAL DATA:  Nausea, vomiting and weakness for 5 days. Rule out dissection.  EXAM: CT ANGIOGRAPHY CHEST, ABDOMEN AND PELVIS  TECHNIQUE: Multidetector CT imaging through the chest, abdomen and pelvis was performed using the standard protocol during bolus administration of intravenous contrast. Multiplanar reconstructed images and MIPs were obtained and reviewed to evaluate the vascular anatomy.  CONTRAST:  OMNIPAQUE IOHEXOL 350 MG/ML SOLN  COMPARISON:  None.  FINDINGS: CTA CHEST FINDINGS  The lungs are clear. Heart is normal in size. Thoracic aorta is unremarkable without evidence of aneurysm or dissection. Remaining mediastinal structures are unremarkable.  Review of the MIP images confirms the above findings.  CTA ABDOMEN AND PELVIS FINDINGS  The liver, spleen, pancreas, gallbladder and adrenal glands are  within normal. Kidneys are normal. Abdominal aorta is normal in caliber without evidence of aneurysm or dissection. The celiac axis and mesenteric arteries are patent. Renal arteries are within normal. Iliac arteries are normal. There is no free fluid or focal inflammatory change present. Appendix is unremarkable.  Pelvic images demonstrate a normal bladder, prostate and rectosigmoid colon. Remaining bones and soft tissues are unremarkable.  Review of the MIP images confirms the above findings.  IMPRESSION: No evidence of aneurysm or dissection involving the thoracoabdominal aorta.  No acute findings in the chest, abdomen or pelvis.   Ct Cta Abd/pel W/cm &/or W/o Cm  04/15/2013   CLINICAL DATA:  Nausea,  vomiting and weakness for 5 days. Rule out dissection.  EXAM: CT ANGIOGRAPHY CHEST, ABDOMEN AND PELVIS  TECHNIQUE: Multidetector CT imaging through the chest, abdomen and pelvis was performed using the standard protocol during bolus administration of intravenous contrast. Multiplanar reconstructed images and MIPs were obtained and reviewed to evaluate the vascular anatomy.  CONTRAST:  100mL OMNIPAQUE IOHEXOL 350 MG/ML SOLN  COMPARISON:  None.  FINDINGS: CTA CHEST FINDINGS  The lungs are clear. Heart is normal in size. Thoracic aorta is unremarkable without evidence of aneurysm or dissection. Remaining mediastinal structures are unremarkable.  Review of the MIP images confirms the above findings.  CTA ABDOMEN AND PELVIS FINDINGS  The liver, spleen, pancreas, gallbladder and adrenal glands are within normal. Kidneys are normal. Abdominal aorta is normal in caliber without evidence of aneurysm or dissection. The celiac axis and mesenteric arteries are patent. Renal arteries are within normal. Iliac arteries are normal. There is no free fluid or focal inflammatory change present. Appendix is unremarkable.  Pelvic images demonstrate a normal bladder, prostate and rectosigmoid colon. Remaining bones and soft tissues are unremarkable.  Review of the MIP images confirms the above findings.  IMPRESSION: No evidence of aneurysm or dissection involving the thoracoabdominal aorta.  No acute findings in the chest, abdomen or pelvis.   ASSESSMENT AND PLAN  [name] is a 38 y.o. male with a history of PCI to RCA in 2009, severe depression/bipolar disorder, and substance abuse who presented overngiht with several days of persistent N/V/D, with worsening chest pressure for 2 days. States he has not been able to keep anything down for several days. Chest pain is similar to prior with radiation and numbness down both arms. He received relief with nitroglycerin in the ED. Similar visits here in the past, including in 2012  where stress test was negative.  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 He was supposed to follow up with Dr. Tresa EndoKelly and continue on DAPT with ASA/Plavix. He is non compliant with medications and did not follow up. - POC trop slightly elevated at .13; however troponin I neg x3. EKG with no acute ST or TW changes.  - Drug screen remarkable for THC - CTA negative for dissection and aneuyrism  - Keep NPO for stress testing today. Would avoid treadmill in the setting of recent illness, weakness and dehydration.  Hypokalemia- mild. Likely 2/2 n/v. Supplemented, continue to monitor   Signed, Thereasa ParkinSTERN, KATHRYN PA-C  Pager [phone]  Agree with note by Deborha PaymentKatie Stern PA-C. Pt admitted with CP and nausea since last Wed. H/O stent 2009. He ruled out for MI by enz. EKG w/o acute changes. Exam benign. Labs OK. No further CP or nausea. Pain was not similar to CP in 2009. OK for D/C home this AM w/o stress  test. ROV with Dr/ TK. CRF modification.  Runell Gess, M.D., FACP, [facility], [facility] [address] [place], [place]  [address]  [phone] 04/16/2013 10:59 AM

## 2013-04-16 NOTE — Consult Note (Signed)
CARDIOLOGY CONSULT NOTE  Patient ID: [name]  MRN: [id number]  DOB: [birth year]  Admit date: 04/15/2013 Date of Consult: 04/16/2013  Primary Physician: No PCP Per Patient Primary Cardiologist:  None  Reason for Consultation: Chest pain  History of Present Illness: [name] is a 38 y.o. male with a history of PCI to RCA in 2009, severe depression/bipolar disorder, who presents with several days of persistent N/V, with worsening chest pressure for 2 days.  States he has not been able to keep anything down for several days.  Chest pain is similar to prior with radiation and numbness down both arms.   He received relief with nitroglycerin in the ED.   Similar visits here in the past, including in 2012 where stress test was negative.  Past Medical History  Diagnosis Date  . MI, old 2009  . Bipolar 1 disorder   . Hypertension   . High cholesterol   . Normal cardiac stress test 2012    normal myoview  . Hx of medication noncompliance   . Polysubstance abuse     Past Surgical History  Procedure Laterality Date  . Femoral artery stent    . Coronary angioplasty with stent placement  2009     Home Meds: Prior to Admission medications   Medication Sig Start Date End Date Taking? Authorizing Provider  ARIPiprazole (ABILIFY) 10 MG tablet Take 10 mg by mouth daily.   Yes Historical Provider, MD  aspirin 81 MG tablet Take 81 mg by mouth daily.   Yes Historical Provider, MD  carbamazepine (TEGRETOL) 200 MG tablet Take 200 mg by mouth every morning.   Yes Historical Provider, MD  carbamazepine (TEGRETOL) 200 MG tablet Take 400 mg by mouth at bedtime.   Yes Historical Provider, MD    Current Medications: . ARIPiprazole  10 mg Oral Daily  . aspirin  81 mg Oral Daily  . carbamazepine  200 mg Oral q morning - 10a  . carbamazepine  400 mg Oral QHS  . heparin  5,000 Units Subcutaneous 3 times per day     Allergies:    Allergies  Allergen Reactions  . Iodine Shortness Of  Breath and Swelling  . Shrimp [Shellfish Allergy] Anaphylaxis    Social History:   The patient  reports that he has been smoking Cigarettes.  He has been smoking about 0.50 packs per day. He does not have any smokeless tobacco history on file. He reports that he drinks alcohol. He reports that he uses illicit drugs (Marijuana).    Family History:   The patient's family history is not on file.   ROS:  Please see the history of present illness.  All other systems reviewed and negative.   Vital Signs: Blood pressure 110/69, pulse 51, temperature 98.3 F (36.8 C), temperature source Oral, resp. rate 19, height 5\' 5"  (1.651 m), weight 58.968 kg (130 lb), SpO2 95.00%.   PHYSICAL EXAM: General:  Appears anxious HEENT: normal Lymph: no adenopathy Neck: no JVD Endocrine:  No thryomegaly Vascular: No carotid bruits; FA pulses 2+ bilaterally without bruits Cardiac:  normal S1, S2; RRR; no murmur Lungs:  clear to auscultation bilaterally, no wheezing, rhonchi or rales Abd: soft, nontender, no hepatomegaly Ext: no edema Musculoskeletal:  No deformities, BUE and BLE strength normal and equal Skin: warm and dry Neuro:  CNs 2-12 intact, no focal abnormalities noted Psych:  Normal affect   EKG:  NSR, no ST-T consistent with early repolarization in v3. Labs:  Recent Labs  04/15/13 2143  TROPONINI <0.30   Lab Results  Component Value Date   WBC 7.0 04/15/2013   HGB 18.2* 04/15/2013   HCT 48.9 04/15/2013   MCV 86.4 04/15/2013   PLT 279 04/15/2013    Recent Labs Lab 04/15/13 2124  NA 131*  K 3.5*  CL 86*  CO2 21  BUN 16  CREATININE 1.07  CALCIUM 9.7  GLUCOSE 81   Lab Results  Component Value Date   CHOL 256* 10/02/2010   HDL 33* 10/02/2010   LDLCALC 208* 10/02/2010   TRIG 75 10/02/2010   Lab Results  Component Value Date   DDIMER  Value: 0.22        AT THE INHOUSE ESTABLISHED CUTOFF VALUE OF 0.48 ug/mL FEU, THIS ASSAY HAS BEEN DOCUMENTED IN THE LITERATURE TO HAVE A SENSITIVITY AND  NEGATIVE PREDICTIVE VALUE OF AT LEAST 98 TO 99%.  THE TEST RESULT SHOULD BE CORRELATED WITH AN ASSESSMENT OF THE CLINICAL PROBABILITY OF DVT / VTE. 01/15/2009    Radiology/Studies:  Dg Chest Port 1 View  04/15/2013   CLINICAL DATA:  Chest pain and nausea.  EXAM: PORTABLE CHEST - 1 VIEW  COMPARISON:  02/05/2013  FINDINGS: Lungs are adequately inflated and otherwise clear. Cardiomediastinal silhouette and remainder the exam is unchanged. Remainder the exam is unchanged.  IMPRESSION: No active disease.   Electronically Signed   By: Elberta Fortis M.D.   On: 04/15/2013 21:48   Ct Angio Chest Aortic Dissect W &/or W/o  04/15/2013   CLINICAL DATA:  Nausea, vomiting and weakness for 5 days. Rule out dissection.  EXAM: CT ANGIOGRAPHY CHEST, ABDOMEN AND PELVIS  TECHNIQUE: Multidetector CT imaging through the chest, abdomen and pelvis was performed using the standard protocol during bolus administration of intravenous contrast. Multiplanar reconstructed images and MIPs were obtained and reviewed to evaluate the vascular anatomy.  CONTRAST:  OMNIPAQUE IOHEXOL 350 MG/ML SOLN  COMPARISON:  None.  FINDINGS: CTA CHEST FINDINGS  The lungs are clear. Heart is normal in size. Thoracic aorta is unremarkable without evidence of aneurysm or dissection. Remaining mediastinal structures are unremarkable.  Review of the MIP images confirms the above findings.  CTA ABDOMEN AND PELVIS FINDINGS  The liver, spleen, pancreas, gallbladder and adrenal glands are within normal. Kidneys are normal. Abdominal aorta is normal in caliber without evidence of aneurysm or dissection. The celiac axis and mesenteric arteries are patent. Renal arteries are within normal. Iliac arteries are normal. There is no free fluid or focal inflammatory change present. Appendix is unremarkable.  Pelvic images demonstrate a normal bladder, prostate and rectosigmoid colon. Remaining bones and soft tissues are unremarkable.  Review of the MIP images confirms the  above findings.  IMPRESSION: No evidence of aneurysm or dissection involving the thoracoabdominal aorta.  No acute findings in the chest, abdomen or pelvis.   Electronically Signed   By: Elberta Fortis M.D.   On: 04/15/2013 23:39   Ct Cta Abd/pel W/cm &/or W/o Cm  04/15/2013   CLINICAL DATA:  Nausea, vomiting and weakness for 5 days. Rule out dissection.  EXAM: CT ANGIOGRAPHY CHEST, ABDOMEN AND PELVIS  TECHNIQUE: Multidetector CT imaging through the chest, abdomen and pelvis was performed using the standard protocol during bolus administration of intravenous contrast. Multiplanar reconstructed images and MIPs were obtained and reviewed to evaluate the vascular anatomy.  CONTRAST:  OMNIPAQUE IOHEXOL 350 MG/ML SOLN  COMPARISON:  None.  FINDINGS: CTA CHEST FINDINGS  The lungs are clear. Heart is  normal in size. Thoracic aorta is unremarkable without evidence of aneurysm or dissection. Remaining mediastinal structures are unremarkable.  Review of the MIP images confirms the above findings.  CTA ABDOMEN AND PELVIS FINDINGS  The liver, spleen, pancreas, gallbladder and adrenal glands are within normal. Kidneys are normal. Abdominal aorta is normal in caliber without evidence of aneurysm or dissection. The celiac axis and mesenteric arteries are patent. Renal arteries are within normal. Iliac arteries are normal. There is no free fluid or focal inflammatory change present. Appendix is unremarkable.  Pelvic images demonstrate a normal bladder, prostate and rectosigmoid colon. Remaining bones and soft tissues are unremarkable.  Review of the MIP images confirms the above findings.  IMPRESSION: No evidence of aneurysm or dissection involving the thoracoabdominal aorta.  No acute findings in the chest, abdomen or pelvis.   Electronically Signed   By: Elberta Fortisaniel  Boyle M.D.   On: 04/15/2013 23:39    ASSESSMENT AND PLAN:  1. Chest Pain with history of DES in 2009 to RCA - Continue aspirin.  Would not start  anticoagulation at this time. - Trend cardiac enzymes - Recheck lipid panel - If enzymes remain negative, could consider repeat stress testing given past history of stent.  Main acute issue appears to be GI distress/anxiety/depression.  Kelly SplinterSigned, Malene Blaydes 04/16/2013 1:51 AM

## 2013-04-16 NOTE — Progress Notes (Signed)
Discharge instructions and prescriptions given.  No questions asked.  Left with family member via walking. [name], [name]

## 2013-05-07 ENCOUNTER — Ambulatory Visit: Payer: Self-pay

## 2013-05-29 ENCOUNTER — Ambulatory Visit: Payer: Self-pay | Admitting: Cardiovascular Disease

## 2013-07-21 ENCOUNTER — Inpatient Hospital Stay (HOSPITAL_COMMUNITY)
Admission: EM | Admit: 2013-07-21 | Discharge: 2013-07-24 | DRG: 251 | Disposition: A | Discharge status: Home / Self Care | Payer: Self-pay | Attending: Internal Medicine | Admitting: Internal Medicine

## 2013-07-21 ENCOUNTER — Encounter (HOSPITAL_COMMUNITY): Payer: Self-pay | Admitting: Emergency Medicine

## 2013-07-21 ENCOUNTER — Emergency Department (HOSPITAL_COMMUNITY): Payer: Self-pay

## 2013-07-21 DIAGNOSIS — K2990 Gastroduodenitis, unspecified, without bleeding: Secondary | ICD-10-CM

## 2013-07-21 DIAGNOSIS — F1010 Alcohol abuse, uncomplicated: Secondary | ICD-10-CM | POA: Diagnosis present

## 2013-07-21 DIAGNOSIS — Z91013 Allergy to seafood: Secondary | ICD-10-CM

## 2013-07-21 DIAGNOSIS — Z9861 Coronary angioplasty status: Secondary | ICD-10-CM

## 2013-07-21 DIAGNOSIS — Z91199 Patient's noncompliance with other medical treatment and regimen due to unspecified reason: Secondary | ICD-10-CM

## 2013-07-21 DIAGNOSIS — Z8249 Family history of ischemic heart disease and other diseases of the circulatory system: Secondary | ICD-10-CM

## 2013-07-21 DIAGNOSIS — Z7982 Long term (current) use of aspirin: Secondary | ICD-10-CM

## 2013-07-21 DIAGNOSIS — E785 Hyperlipidemia, unspecified: Secondary | ICD-10-CM

## 2013-07-21 DIAGNOSIS — I472 Ventricular tachycardia, unspecified: Secondary | ICD-10-CM | POA: Diagnosis present

## 2013-07-21 DIAGNOSIS — Z888 Allergy status to other drugs, medicaments and biological substances status: Secondary | ICD-10-CM

## 2013-07-21 DIAGNOSIS — Z5689 Other problems related to employment: Secondary | ICD-10-CM

## 2013-07-21 DIAGNOSIS — Z716 Tobacco abuse counseling: Secondary | ICD-10-CM

## 2013-07-21 DIAGNOSIS — I4729 Other ventricular tachycardia: Secondary | ICD-10-CM | POA: Diagnosis present

## 2013-07-21 DIAGNOSIS — K2970 Gastritis, unspecified, without bleeding: Secondary | ICD-10-CM

## 2013-07-21 DIAGNOSIS — I4510 Unspecified right bundle-branch block: Secondary | ICD-10-CM | POA: Diagnosis present

## 2013-07-21 DIAGNOSIS — I10 Essential (primary) hypertension: Secondary | ICD-10-CM

## 2013-07-21 DIAGNOSIS — Z9119 Patient's noncompliance with other medical treatment and regimen: Secondary | ICD-10-CM

## 2013-07-21 DIAGNOSIS — R0789 Other chest pain: Secondary | ICD-10-CM

## 2013-07-21 DIAGNOSIS — Z91041 Radiographic dye allergy status: Secondary | ICD-10-CM

## 2013-07-21 DIAGNOSIS — F1910 Other psychoactive substance abuse, uncomplicated: Secondary | ICD-10-CM

## 2013-07-21 DIAGNOSIS — E7800 Pure hypercholesterolemia, unspecified: Secondary | ICD-10-CM | POA: Diagnosis present

## 2013-07-21 DIAGNOSIS — F319 Bipolar disorder, unspecified: Secondary | ICD-10-CM | POA: Diagnosis present

## 2013-07-21 DIAGNOSIS — I214 Non-ST elevation (NSTEMI) myocardial infarction: Principal | ICD-10-CM

## 2013-07-21 DIAGNOSIS — T82897A Other specified complication of cardiac prosthetic devices, implants and grafts, initial encounter: Secondary | ICD-10-CM | POA: Diagnosis present

## 2013-07-21 DIAGNOSIS — I2582 Chronic total occlusion of coronary artery: Secondary | ICD-10-CM | POA: Diagnosis present

## 2013-07-21 DIAGNOSIS — I252 Old myocardial infarction: Secondary | ICD-10-CM

## 2013-07-21 DIAGNOSIS — F17200 Nicotine dependence, unspecified, uncomplicated: Secondary | ICD-10-CM | POA: Diagnosis present

## 2013-07-21 DIAGNOSIS — I2510 Atherosclerotic heart disease of native coronary artery without angina pectoris: Secondary | ICD-10-CM

## 2013-07-21 DIAGNOSIS — Y849 Medical procedure, unspecified as the cause of abnormal reaction of the patient, or of later complication, without mention of misadventure at the time of the procedure: Secondary | ICD-10-CM | POA: Diagnosis present

## 2013-07-21 DIAGNOSIS — Z955 Presence of coronary angioplasty implant and graft: Secondary | ICD-10-CM

## 2013-07-21 HISTORY — DX: Anxiety disorder, unspecified: F41.9

## 2013-07-21 HISTORY — DX: Other ventricular tachycardia: I47.29

## 2013-07-21 HISTORY — DX: Hyperlipidemia, unspecified: E78.5

## 2013-07-21 HISTORY — DX: Ventricular tachycardia: I47.2

## 2013-07-21 HISTORY — DX: Ischemic cardiomyopathy: I25.5

## 2013-07-21 LAB — CBC
HCT: 43.6 % (ref 39.0–52.0)
HCT: 44.5 % (ref 39.0–52.0)
HEMOGLOBIN: 15.3 g/dL (ref 13.0–17.0)
Hemoglobin: 15.1 g/dL (ref 13.0–17.0)
MCH: 30.8 pg (ref 26.0–34.0)
MCH: 30.6 pg (ref 26.0–34.0)
MCHC: 34.6 g/dL (ref 30.0–36.0)
MCHC: 34.4 g/dL (ref 30.0–36.0)
MCV: 88.8 fL (ref 78.0–100.0)
MCV: 89 fL (ref 78.0–100.0)
Platelets: 208 10*3/uL (ref 150–400)
Platelets: 186 10*3/uL (ref 150–400)
RBC: 4.91 MIL/uL (ref 4.22–5.81)
RBC: 5 MIL/uL (ref 4.22–5.81)
RDW: 15.3 % (ref 11.5–15.5)
RDW: 15.2 % (ref 11.5–15.5)
WBC: 6.2 10*3/uL (ref 4.0–10.5)
WBC: 6.3 10*3/uL (ref 4.0–10.5)

## 2013-07-21 LAB — RAPID URINE DRUG SCREEN, HOSP PERFORMED
AMPHETAMINES: NOT DETECTED
Barbiturates: NOT DETECTED
Benzodiazepines: NOT DETECTED
COCAINE: NOT DETECTED
OPIATES: NOT DETECTED
TETRAHYDROCANNABINOL: POSITIVE — AB

## 2013-07-21 LAB — BASIC METABOLIC PANEL
Anion gap: 22 — ABNORMAL HIGH (ref 5–15)
BUN: 7 mg/dL (ref 6–23)
CO2: 17 mEq/L — ABNORMAL LOW (ref 19–32)
Calcium: 8.8 mg/dL (ref 8.4–10.5)
Chloride: 100 mEq/L (ref 96–112)
Creatinine, Ser: 1.18 mg/dL (ref 0.50–1.35)
GFR calc Af Amer: >90 mL/min (ref >90)
GFR calc non Af Amer: 79 mL/min — ABNORMAL LOW (ref >90)
Glucose, Bld: 139 mg/dL — ABNORMAL HIGH (ref 70–99)
Potassium: 3.8 mEq/L (ref 3.7–5.3)
Sodium: 139 mEq/L (ref 137–147)

## 2013-07-21 LAB — TROPONIN I
TROPONIN I: 4.44 ng/mL — AB (ref <0.30)
Troponin I: <0.3 ng/mL (ref <0.30)
Troponin I: 16.13 ng/mL (ref <0.30)

## 2013-07-21 LAB — PROTIME-INR
INR: 0.97 (ref 0.00–1.49)
Prothrombin Time: 12.9 seconds (ref 11.6–15.2)

## 2013-07-21 LAB — PRO B NATRIURETIC PEPTIDE: Pro B Natriuretic peptide (BNP): 51.6 pg/mL (ref 0–125)

## 2013-07-21 LAB — CREATININE, SERUM
CREATININE: 1.06 mg/dL (ref 0.50–1.35)
GFR calc Af Amer: >90 mL/min (ref >90)
GFR calc non Af Amer: 89 mL/min — ABNORMAL LOW (ref >90)

## 2013-07-21 LAB — MRSA PCR SCREENING: MRSA by PCR: NEGATIVE

## 2013-07-21 LAB — I-STAT TROPONIN, ED: Troponin i, poc: 0.01 ng/mL (ref 0.00–0.08)

## 2013-07-21 MED ORDER — MORPHINE SULFATE 2 MG/ML IJ SOLN
2.0000 mg | INTRAMUSCULAR | Status: DC | PRN
  Administered 2013-07-21: 2 mg via INTRAVENOUS

## 2013-07-21 MED ORDER — ARIPIPRAZOLE 10 MG PO TABS
10.0000 mg | ORAL_TABLET | Freq: Every day | ORAL | Status: DC
  Administered 2013-07-22 – 2013-07-24 (×3): 10 mg via ORAL
  Filled 2013-07-21 (×3): qty 1

## 2013-07-21 MED ORDER — SODIUM CHLORIDE 0.9 % IJ SOLN: 3.0000 mL | INTRAMUSCULAR | Status: DC | PRN

## 2013-07-21 MED ORDER — ACETAMINOPHEN 325 MG PO TABS
650.0000 mg | ORAL_TABLET | ORAL | Status: DC | PRN
  Administered 2013-07-24: 650 mg via ORAL
  Filled 2013-07-21: qty 2

## 2013-07-21 MED ORDER — ASPIRIN 81 MG PO CHEW
81.0000 mg | CHEWABLE_TABLET | Freq: Every day | ORAL | Status: DC
  Administered 2013-07-21 – 2013-07-24 (×4): 81 mg via ORAL
  Filled 2013-07-21 (×4): qty 1

## 2013-07-21 MED ORDER — GI COCKTAIL ~~LOC~~
30.0000 mL | Freq: Once | ORAL | Status: AC
  Administered 2013-07-21: 30 mL via ORAL
  Filled 2013-07-21: qty 30

## 2013-07-21 MED ORDER — CARBAMAZEPINE 200 MG PO TABS
400.0000 mg | ORAL_TABLET | Freq: Every day | ORAL | Status: DC
  Administered 2013-07-21 – 2013-07-23 (×3): 400 mg via ORAL
  Filled 2013-07-21 (×4): qty 2

## 2013-07-21 MED ORDER — CARBAMAZEPINE 200 MG PO TABS
200.0000 mg | ORAL_TABLET | Freq: Every day | ORAL | Status: DC
  Administered 2013-07-22 – 2013-07-24 (×3): 200 mg via ORAL
  Filled 2013-07-21 (×3): qty 1

## 2013-07-21 MED ORDER — HEPARIN SODIUM (PORCINE) 5000 UNIT/ML IJ SOLN
5000.0000 [IU] | Freq: Three times a day (TID) | INTRAMUSCULAR | Status: DC
  Filled 2013-07-21 (×3): qty 1

## 2013-07-21 MED ORDER — ASPIRIN EC 81 MG PO TBEC: 81.0000 mg | DELAYED_RELEASE_TABLET | Freq: Every day | ORAL | Status: DC

## 2013-07-21 MED ORDER — HEPARIN (PORCINE) IN NACL 100-0.45 UNIT/ML-% IJ SOLN
950.0000 [IU]/h | INTRAMUSCULAR | Status: DC
  Administered 2013-07-21: 800 [IU]/h via INTRAVENOUS
  Filled 2013-07-21 (×2): qty 250

## 2013-07-21 MED ORDER — CARBAMAZEPINE 200 MG PO TABS: 200.0000 mg | ORAL_TABLET | Freq: Two times a day (BID) | ORAL | Status: DC

## 2013-07-21 MED ORDER — MORPHINE SULFATE 2 MG/ML IJ SOLN
INTRAMUSCULAR | Status: AC
  Filled 2013-07-21: qty 1

## 2013-07-21 MED ORDER — SODIUM CHLORIDE 0.9 % IV SOLN: 250.0000 mL | INTRAVENOUS | Status: DC | PRN

## 2013-07-21 MED ORDER — PANTOPRAZOLE SODIUM 40 MG PO TBEC
40.0000 mg | DELAYED_RELEASE_TABLET | Freq: Every day | ORAL | Status: DC
  Administered 2013-07-21 – 2013-07-24 (×4): 40 mg via ORAL
  Filled 2013-07-21 (×2): qty 1

## 2013-07-21 MED ORDER — SODIUM CHLORIDE 0.9 % IJ SOLN
3.0000 mL | Freq: Two times a day (BID) | INTRAMUSCULAR | Status: DC
  Administered 2013-07-21 – 2013-07-22 (×3): 3 mL via INTRAVENOUS

## 2013-07-21 MED ORDER — MORPHINE SULFATE 4 MG/ML IJ SOLN
4.0000 mg | Freq: Once | INTRAMUSCULAR | Status: AC
  Administered 2013-07-21: 4 mg via INTRAVENOUS
  Filled 2013-07-21: qty 1

## 2013-07-21 MED ORDER — HEPARIN BOLUS VIA INFUSION
3800.0000 [IU] | Freq: Once | INTRAVENOUS | Status: AC
  Administered 2013-07-21: 3800 [IU] via INTRAVENOUS
  Filled 2013-07-21: qty 3800

## 2013-07-21 MED ORDER — ONDANSETRON HCL 4 MG/2ML IJ SOLN
4.0000 mg | Freq: Four times a day (QID) | INTRAMUSCULAR | Status: DC | PRN
  Administered 2013-07-21: 4 mg via INTRAVENOUS
  Filled 2013-07-21: qty 2

## 2013-07-21 MED ORDER — NITROGLYCERIN 0.4 MG SL SUBL: 0.4000 mg | SUBLINGUAL_TABLET | SUBLINGUAL | Status: DC | PRN

## 2013-07-21 MED ORDER — SUCRALFATE 1 GM/10ML PO SUSP
1.0000 g | Freq: Three times a day (TID) | ORAL | Status: DC
  Administered 2013-07-21 – 2013-07-24 (×10): 1 g via ORAL
  Filled 2013-07-21 (×16): qty 10

## 2013-07-21 MED ORDER — NITROGLYCERIN IN D5W 200-5 MCG/ML-% IV SOLN
2.0000 ug/min | INTRAVENOUS | Status: DC
  Administered 2013-07-21: 5 ug/min via INTRAVENOUS
  Filled 2013-07-21: qty 250

## 2013-07-21 MED ORDER — ONDANSETRON HCL 4 MG PO TABS
4.0000 mg | ORAL_TABLET | Freq: Three times a day (TID) | ORAL | Status: DC | PRN
  Filled 2013-07-21: qty 1

## 2013-07-21 NOTE — ED Notes (Signed)
Pt requesting nitro, MD Norlene CampbellOtter made aware.

## 2013-07-21 NOTE — Progress Notes (Signed)
Called by RN regarding elevated troponin. His initial troponin was negative but a repeat troponin was significantly elevated at 4.44. He is having some mild chest discomfort but his ECG is unchanged.  We'll change his heparin from DVT prophylaxis to full anticoagulation per pharmacy. Add IV nitroglycerin to his medication regimen and monitor him closely for continued symptoms. If unable to get pain free or has ST elevation, may need urgent cath.

## 2013-07-21 NOTE — H&P (Signed)
ADMISSION HISTORY & PHYSICAL   Chief Complaint:  Chest pain, nausea, vomiting  Cardiologist: None (new)  Primary Care Physician: No PCP Per Patient  HPI:  This is a 38 y.o. male with a past medical history significant for CAD. His last heart catheterization was in 2011 which showed an EF of 20-30% and a patent stent in the LAD. A followup echocardiogram showed an EF of 40%. He was admitted on October 01, 2010 secondary to chest pain. Prior to his ER visit, he stated he had had 2 weeks of nausea and vomiting and 20 minutes prior to arrival he was walking to a store became nauseated, felt lightheaded and developed substernal chest pain with radiation to his left arm. EMS was called. He was given a total of five sublingual nitro with complete resolution of the chest pain after the fifth one, he was then pain free since that time.  He subsequently underwent a stress Myoview which was negative for ischemia and his EF had improved to 60%. He ruled out for acute coronary syndrome. Aspirin was added in he was instructed to followup with me in the office. Unfortunately that never happened.  He was consult on by psychiatry in January 2015 for suicidal ideations and depression. He was diagnosed with bipolar disorder and depression. He also presented to [facility] ED on 04/15/13 with several days of persistent N/V/D associated with chest pressure. His chest pain was not reminiscent of his previous cardiac chest pain with his STEMI. He ruled out again for MI and was discharged without any further functional testing.   He reports that recently he had been trying to get a job at [employer], and had recently lost his job because the past several weeks he's been having it again nausea, vomiting and feeling ill on a daily basis which has kept him from working. He awakened early this morning with chest pain that has been persistent. He describes it as a squeezing or pressure in the chest that feels like somebody is  clenching their fists together. It was thought in the past the symptoms were more likely GI however he never made an appointment with the gastroenterologist. He was also not compliant with follow-up outpatient testing.  PMHx:  Past Medical History  Diagnosis Date  . MI, old 2009  . Bipolar 1 disorder   . Hypertension   . High cholesterol   . Normal cardiac stress test 2012    normal myoview  . Hx of medication noncompliance   . Polysubstance abuse     Past Surgical History  Procedure Laterality Date  . Femoral artery stent    . Coronary angioplasty with stent placement  2009    FAMHx:  Family History  Problem Relation Age of Onset  . CAD Mother   . CAD Brother   . CAD Sister     SOCHx:   reports that he has been smoking Cigarettes.  He has been smoking about 0.50 packs per day. He does not have any smokeless tobacco history on file. He reports that he drinks alcohol. He reports that he uses illicit drugs (Marijuana).  ALLERGIES:  Allergies  Allergen Reactions  . Iodine Shortness Of Breath and Swelling  . Shrimp [Shellfish Allergy] Anaphylaxis    ROS: A comprehensive review of systems was negative except for: Cardiovascular: positive for chest pain Gastrointestinal: positive for nausea, reflux symptoms and vomiting  HOME MEDS: No current facility-administered medications on file prior to encounter.   Current Outpatient Prescriptions on  File Prior to Encounter  Medication Sig Dispense Refill  . ARIPiprazole (ABILIFY) 10 MG tablet Take 10 mg by mouth daily.      Marland Kitchen aspirin 81 MG tablet Take 81 mg by mouth daily.      . carbamazepine (TEGRETOL) 200 MG tablet Take 200-400 mg by mouth 2 (two) times daily. 270m in AM, 4065mQHS      . famotidine (PEPCID) 20 MG tablet Take 1 tablet (20 mg total) by mouth 2 (two) times daily.  20 tablet  0  . ondansetron (ZOFRAN) 4 MG tablet Take 1 tablet (4 mg total) by mouth every 8 (eight) hours as needed for nausea or vomiting.  20  tablet  0   LABS/IMAGING: Results for orders placed during the hospital encounter of 07/21/13 (from the past 48 hour(s))  CBC     Status: None   Collection Time    07/21/13  5:30 AM      Result Value Ref Range   WBC 6.2  4.0 - 10.5 K/uL   RBC 4.91  4.22 - 5.81 MIL/uL   Hemoglobin 15.1  13.0 - 17.0 g/dL   HCT 43.6  39.0 - 52.0 %   MCV 88.8  78.0 - 100.0 fL   MCH 30.8  26.0 - 34.0 pg   MCHC 34.6  30.0 - 36.0 g/dL   RDW 15.3  11.5 - 15.5 %   Platelets 208  150 - 400 K/uL  BASIC METABOLIC PANEL     Status: Abnormal   Collection Time    07/21/13  5:30 AM      Result Value Ref Range   Sodium 139  137 - 147 mEq/L   Potassium 3.8  3.7 - 5.3 mEq/L   Chloride 100  96 - 112 mEq/L   CO2 17 (*) 19 - 32 mEq/L   Glucose, Bld 139 (*) 70 - 99 mg/dL   BUN 7  6 - 23 mg/dL   Creatinine, Ser 1.18  0.50 - 1.35 mg/dL   Calcium 8.8  8.4 - 10.5 mg/dL   GFR calc non Af Amer 79 (*) >90 mL/min   GFR calc Af Amer >90  >90 mL/min   Comment: (NOTE)     The eGFR has been calculated using the CKD EPI equation.     This calculation has not been validated in all clinical situations.     eGFR's persistently <90 mL/min signify possible Chronic Kidney     Disease.   Anion gap 22 (*) 5 - 15  PRO B NATRIURETIC PEPTIDE     Status: None   Collection Time    07/21/13  5:30 AM      Result Value Ref Range   Pro B Natriuretic peptide (BNP) 51.6  0 - 125 pg/mL  TROPONIN I     Status: None   Collection Time    07/21/13  5:30 AM      Result Value Ref Range   Troponin I <0.30  <0.30 ng/mL   Comment:            Due to the release kinetics of cTnI,     a negative result within the first hours     of the onset of symptoms does not rule out     myocardial infarction with certainty.     If myocardial infarction is still suspected,     repeat the test at appropriate intervals.  PROTIME-INR     Status: None   Collection Time  07/21/13  5:30 AM      Result Value Ref Range   Prothrombin Time 12.9  11.6 - 15.2  seconds   INR 0.97  0.00 - 1.49  I-STAT TROPOININ, ED     Status: None   Collection Time    07/21/13  5:40 AM      Result Value Ref Range   Troponin i, poc 0.01  0.00 - 0.08 ng/mL   Comment 3            Comment: Due to the release kinetics of cTnI,     a negative result within the first hours     of the onset of symptoms does not rule out     myocardial infarction with certainty.     If myocardial infarction is still suspected,     repeat the test at appropriate intervals.  URINE RAPID DRUG SCREEN (HOSP PERFORMED)     Status: Abnormal   Collection Time    07/21/13  7:27 AM      Result Value Ref Range   Opiates NONE DETECTED  NONE DETECTED   Cocaine NONE DETECTED  NONE DETECTED   Benzodiazepines NONE DETECTED  NONE DETECTED   Amphetamines NONE DETECTED  NONE DETECTED   Tetrahydrocannabinol POSITIVE (*) NONE DETECTED   Barbiturates NONE DETECTED  NONE DETECTED   Comment:            DRUG SCREEN FOR MEDICAL PURPOSES     ONLY.  IF CONFIRMATION IS NEEDED     FOR ANY PURPOSE, NOTIFY LAB     WITHIN 5 DAYS.                LOWEST DETECTABLE LIMITS     FOR URINE DRUG SCREEN     Drug Class       Cutoff (ng/mL)     Amphetamine      1000     Barbiturate      200     Benzodiazepine   102     Tricyclics       585     Opiates          300     Cocaine          300     THC              50   Dg Chest Port 1 View  07/21/2013   CLINICAL DATA:  Chest pain and emesis.  EXAM: PORTABLE CHEST - 1 VIEW  COMPARISON:  02/05/2013  FINDINGS: Pulmonary hyperinflation. Vague nodular opacity over the right lung base probably represents prominent nipple shadow. The heart size and mediastinal contours are within normal limits. Both lungs are clear. The visualized skeletal structures are unremarkable.  IMPRESSION: No active disease.   Electronically Signed   By: Lucienne Capers M.D.   On: 07/21/2013 05:55    VITALS: Filed Vitals:   07/21/13 1345  BP: 140/84  Pulse: 67  Temp:   Resp: 15     EXAM: General appearance: alert and no distress Neck: no carotid bruit and no JVD Lungs: clear to auscultation bilaterally Heart: regular rate and rhythm, S1, S2 normal, no murmur, click, rub or gallop Abdomen: soft, non-tender; bowel sounds normal; no masses,  no organomegaly Extremities: extremities normal, atraumatic, no cyanosis or edema Pulses: 2+ and symmetric Skin: Skin color, texture, turgor normal. No rashes or lesions Neurologic: Grossly normal Psych: Bipolar, normal mood, affect  IMPRESSION: Principal Problem:   Atypical chest pain  Active Problems:   HYPERTENSION   CORONARY ARTERY DISEASE   GASTRITIS   HLD (hyperlipidemia)   PLAN: 1. Chest pain is constant, atypical for cardiac chest pain and associated with several weeks of nausea and vomiting. He has not seen GI and it would be worthwhile getting their input prior to discharge. I would recommend admission to r/o ACS. Pain management. Start PPI. May need repeat functional testing in am such as an exercise stress test. Keep NPO after midnight.  Symptoms may also be psychosomatic in nature.  Pixie Casino, MD, [facility] Attending Cardiologist [facility]  Chevelle Durr C 07/21/2013, 2:04 PM

## 2013-07-21 NOTE — Progress Notes (Signed)
ANTICOAGULATION CONSULT NOTE - Initial Consult  Pharmacy Consult for heparin Indication: chest pain/ACS  Allergies  Allergen Reactions  . Iodine Shortness Of Breath and Swelling  . Shrimp [Shellfish Allergy] Anaphylaxis    Patient Measurements: Height: 6' (182.9 cm) Weight: 139 lb (63.05 kg) IBW/kg (Calculated) : 77.6  Vital Signs: Temp: 97.8 F (36.6 C) (07/11 1605) Temp src: Oral (07/11 1605) BP: 154/92 mmHg (07/11 1605) Pulse Rate: 55 (07/11 1605)  Labs:  Recent Labs  07/21/13 0530 07/21/13 1644  HGB 15.1 15.3  HCT 43.6 44.5  PLT 208 186  LABPROT 12.9  --   INR 0.97  --   CREATININE 1.18 1.06  TROPONINI <0.30 4.44*    Estimated Creatinine Clearance: 86.8 ml/min (by C-G formula based on Cr of 1.06).   Medical History: Past Medical History  Diagnosis Date  . MI, old 2009  . Bipolar 1 disorder   . Hypertension   . High cholesterol   . Normal cardiac stress test 2012    normal myoview  . Hx of medication noncompliance   . Polysubstance abuse   . Anxiety     Medications:  Prescriptions prior to admission  Medication Sig Dispense Refill  . ARIPiprazole (ABILIFY) 10 MG tablet Take 10 mg by mouth daily.      Marland Kitchen. aspirin 81 MG tablet Take 81 mg by mouth daily.      . carbamazepine (TEGRETOL) 200 MG tablet Take 200-400 mg by mouth 2 (two) times daily. 200mg  in AM, 400mg  QHS      . famotidine (PEPCID) 20 MG tablet Take 1 tablet (20 mg total) by mouth 2 (two) times daily.  20 tablet  0  . ondansetron (ZOFRAN) 4 MG tablet Take 1 tablet (4 mg total) by mouth every 8 (eight) hours as needed for nausea or vomiting.  20 tablet  0    Assessment: 38 y/o male with a history of CAD who presented to the ED with chest pain and associated N/V. Initial troponin was negative however now is positive at 4.4. Pharmacy consulted to begin IV heparin. He was ordered SQ heparin for VTE prophylaxis but received no doses.   Patient has an order for aspirin 81 mg but it has not been  given yet.   Renal function is normal. No bleeding noted, CBC is normal.  Goal of Therapy:  Heparin level 0.3-0.7 units/ml Monitor platelets by anticoagulation protocol: Yes   Plan:  - Heparin 3800 units IV bolus then 800 units/hr - 6 hr heparin level - Daily heparin level and CBC - Text paged Theodore Demarkhonda Barrett and suggested ordering aspirin 324 mg   [facility] Rehabilitation InstituteJennifer [place], CrellinPharm.D., BCPS Clinical Pharmacist Pager: [phone] 07/21/2013 7:04 PM

## 2013-07-21 NOTE — Progress Notes (Signed)
CRITICAL VALUE ALERT  Critical value received:  Troponin 4.4  Date of notification: 07/21/13  Time of notification: 1849  Critical value read back:yes  Nurse who received alert:  Newell CoralLaura Jovani Flury RN  MD notified (1st page): Purvis SheffieldKoneswaran  Time of first page: 1850  MD notified (2nd page):  Time of second page:  Responding MD: Purvis SheffieldKoneswaran  Time MD responded: [phone]

## 2013-07-21 NOTE — ED Notes (Signed)
Spoke with Dr. Rennis GoldenHilty who is aware of patient's continued chest pain.  Advised that patient is having atypical chest pain with vomiting and should be okay for the floor.

## 2013-07-21 NOTE — ED Notes (Signed)
Attempted to call report to [facility].  Nurse unavailable.  Advised they would call back shortly.

## 2013-07-21 NOTE — ED Notes (Signed)
Patient began having CP about an hour ago that woke him up from sleep. Patient describes it as a 8/10 intermittent squeezing pain which is non-reproducible that felt at first like his MI he had 5 years ago, but now states it feels different. Patient is nauseous and tachypneic. Patient has had 2 SL nitro and 324 ASA which took his pain from 8 to a 7. Denies drug use.

## 2013-07-21 NOTE — ED Provider Notes (Signed)
CSN: [id number]     Arrival date & time 07/21/13  [id number] History   First MD Initiated Contact with Patient 07/21/13 [phone]     Chief Complaint  Patient presents with  . Chest Pain     (Consider location/radiation/quality/duration/timing/severity/associated sxs/prior Treatment) Patient is a 38 y.o. male presenting with chest pain. The history is provided by the patient.  Chest Pain Pain location:  Substernal area Pain quality: aching, burning, pressure and sharp   Pain radiates to:  Does not radiate Pain radiates to the back: no   Pain severity:  Moderate Onset quality:  Sudden Timing:  Constant Progression:  Unchanged Chronicity:  Recurrent Context: at rest (while sleeping)   Relieved by:  Nothing Associated symptoms: cough (for past 2 weeks), fever (my head felt hot) and shortness of breath   Associated symptoms: no abdominal pain, no back pain, no lower extremity edema and not vomiting     Past Medical History  Diagnosis Date  . MI, old 2009  . Bipolar 1 disorder   . Hypertension   . High cholesterol   . Normal cardiac stress test 2012    normal myoview  . Hx of medication noncompliance   . Polysubstance abuse    Past Surgical History  Procedure Laterality Date  . Femoral artery stent    . Coronary angioplasty with stent placement  2009   No family history on file. History  Substance Use Topics  . Smoking status: Current Every Day Smoker -- 0.50 packs/day    Types: Cigarettes  . Smokeless tobacco: Not on file  . Alcohol Use: Yes    Review of Systems  Constitutional: Positive for fever (my head felt hot).  Respiratory: Positive for cough (for past 2 weeks) and shortness of breath.   Cardiovascular: Positive for chest pain. Negative for leg swelling.  Gastrointestinal: Negative for vomiting and abdominal pain.  Musculoskeletal: Negative for back pain.  All other systems reviewed and are negative.     Allergies  Iodine and Shrimp  Home Medications    Prior to Admission medications   Medication Sig Start Date End Date Taking? Authorizing Provider  ARIPiprazole (ABILIFY) 10 MG tablet Take 10 mg by mouth daily.    Historical Provider, MD  aspirin 81 MG tablet Take 81 mg by mouth daily.    Historical Provider, MD  carbamazepine (TEGRETOL) 200 MG tablet Take 200-400 mg by mouth 2 (two) times daily. 200mg  in AM, 400mg  QHS    Historical Provider, MD  famotidine (PEPCID) 20 MG tablet Take 1 tablet (20 mg total) by mouth 2 (two) times daily. 04/16/13   Nishant Dhungel, MD  ondansetron (ZOFRAN) 4 MG tablet Take 1 tablet (4 mg total) by mouth every 8 (eight) hours as needed for nausea or vomiting. 04/16/13   Nishant Dhungel, MD   BP 137/85  Pulse 56  Temp(Src) 97.7 F (36.5 C) (Oral)  Resp 17  SpO2 100% Physical Exam  Nursing note and vitals reviewed. Constitutional: He is oriented to person, place, and time. He appears well-developed and well-nourished. No distress.  HENT:  Head: Normocephalic and atraumatic.  Mouth/Throat: No oropharyngeal exudate.  Eyes: EOM are normal. Pupils are equal, round, and reactive to light.  Neck: Normal range of motion. Neck supple.  Cardiovascular: Normal rate and regular rhythm.  Exam reveals no friction rub.   No murmur heard. Pulmonary/Chest: Effort normal and breath sounds normal. No respiratory distress. He has no wheezes. He has no rales.  Abdominal: He exhibits no distension.  There is no tenderness. There is no rebound.  Musculoskeletal: Normal range of motion. He exhibits no edema.  Neurological: He is alert and oriented to person, place, and time.  Skin: He is not diaphoretic.    ED Course  Procedures (including critical care time) Labs Review Labs Reviewed  BASIC METABOLIC PANEL - Abnormal; Notable for the following:    CO2 17 (*)    Glucose, Bld 139 (*)    GFR calc non Af Amer 79 (*)    Anion gap 22 (*)    All other components within normal limits  CBC  PRO B NATRIURETIC PEPTIDE  TROPONIN  I  PROTIME-INR  URINE RAPID DRUG SCREEN (HOSP PERFORMED)  Rosezena Sensor, ED    Imaging Review Dg Chest Port 1 View  07/21/2013   CLINICAL DATA:  Chest pain and emesis.  EXAM: PORTABLE CHEST - 1 VIEW  COMPARISON:  02/05/2013  FINDINGS: Pulmonary hyperinflation. Vague nodular opacity over the right lung base probably represents prominent nipple shadow. The heart size and mediastinal contours are within normal limits. Both lungs are clear. The visualized skeletal structures are unremarkable.  IMPRESSION: No active disease.   Electronically Signed   By: Burman Nieves M.D.   On: 07/21/2013 05:55     EKG Interpretation   Date/Time:  Saturday July 21 2013 05:29:46 EDT Ventricular Rate:  62 PR Interval:  150 QRS Duration: 125 QT Interval:  460 QTC Calculation: 467 R Axis:   67 Text Interpretation:  Sinus arrhythmia Nonspecific intraventricular  conduction delay ST elev, probable normal early repol pattern ST elevation  increased in aVL, V2 from prior Confirmed by OTTER  MD, OLGA ([id number]) on  07/21/2013 5:45:13 AM      MDM   Final diagnoses:  Atypical chest pain  Coronary atherosclerosis of unspecified type of vessel, native or graft  HLD (hyperlipidemia)  Other, mixed, or unspecified nondependent drug abuse, unspecified  Unspecified essential hypertension  Unspecified gastritis and gastroduodenitis without mention of hemorrhage    45M presents with chest pain. Hx of CAD with stent 6 years ago. Awoke him from sleep. He states this feels similar to prior MI. No relief with ASA or NTG. Pain described as sharp, pressure, an burning. He also states some relief with sitting up. No diffuse ST elevation, but some mild ST changes compared to prior. Initial normal troponin. Will consult Cardiology with his CAD hx.    Dagmar Hait, MD 07/21/13 [phone]

## 2013-07-22 DIAGNOSIS — I214 Non-ST elevation (NSTEMI) myocardial infarction: Secondary | ICD-10-CM

## 2013-07-22 LAB — CBC
HCT: 41.5 % (ref 39.0–52.0)
HCT: 41.6 % (ref 39.0–52.0)
Hemoglobin: 14.5 g/dL (ref 13.0–17.0)
Hemoglobin: 14.2 g/dL (ref 13.0–17.0)
MCH: 31.1 pg (ref 26.0–34.0)
MCH: 30.4 pg (ref 26.0–34.0)
MCHC: 34.9 g/dL (ref 30.0–36.0)
MCHC: 34.1 g/dL (ref 30.0–36.0)
MCV: 89.1 fL (ref 78.0–100.0)
MCV: 89.1 fL (ref 78.0–100.0)
PLATELETS: 187 10*3/uL (ref 150–400)
PLATELETS: 199 10*3/uL (ref 150–400)
RBC: 4.66 MIL/uL (ref 4.22–5.81)
RBC: 4.67 MIL/uL (ref 4.22–5.81)
RDW: 15.1 % (ref 11.5–15.5)
RDW: 15.2 % (ref 11.5–15.5)
WBC: 7.2 10*3/uL (ref 4.0–10.5)
WBC: 7.4 10*3/uL (ref 4.0–10.5)

## 2013-07-22 LAB — LIPID PANEL
CHOL/HDL RATIO: 4.7 ratio
Cholesterol: 284 mg/dL — ABNORMAL HIGH (ref 0–200)
HDL: 60 mg/dL (ref >39)
LDL Cholesterol: 208 mg/dL — ABNORMAL HIGH (ref 0–99)
Triglycerides: 82 mg/dL (ref <150)
VLDL: 16 mg/dL (ref 0–40)

## 2013-07-22 LAB — HEPATIC FUNCTION PANEL
ALT: 32 U/L (ref 0–53)
AST: 88 U/L — ABNORMAL HIGH (ref 0–37)
Albumin: 3.3 g/dL — ABNORMAL LOW (ref 3.5–5.2)
Alkaline Phosphatase: 62 U/L (ref 39–117)
Bilirubin, Direct: <0.2 mg/dL (ref 0.0–0.3)
TOTAL PROTEIN: 6 g/dL (ref 6.0–8.3)
Total Bilirubin: 0.5 mg/dL (ref 0.3–1.2)

## 2013-07-22 LAB — BASIC METABOLIC PANEL
ANION GAP: 14 (ref 5–15)
BUN: 7 mg/dL (ref 6–23)
CALCIUM: 9 mg/dL (ref 8.4–10.5)
CO2: 24 mEq/L (ref 19–32)
CREATININE: 1.03 mg/dL (ref 0.50–1.35)
Chloride: 100 mEq/L (ref 96–112)
GFR calc Af Amer: >90 mL/min (ref >90)
GFR calc non Af Amer: >90 mL/min (ref >90)
Glucose, Bld: 99 mg/dL (ref 70–99)
Potassium: 4.3 mEq/L (ref 3.7–5.3)
Sodium: 138 mEq/L (ref 137–147)

## 2013-07-22 LAB — HEPARIN LEVEL (UNFRACTIONATED)
HEPARIN UNFRACTIONATED: 0.41 [IU]/mL (ref 0.30–0.70)
Heparin Unfractionated: 0.33 IU/mL (ref 0.30–0.70)

## 2013-07-22 LAB — MAGNESIUM: Magnesium: 1.9 mg/dL (ref 1.5–2.5)

## 2013-07-22 MED ORDER — ATORVASTATIN CALCIUM 80 MG PO TABS
80.0000 mg | ORAL_TABLET | Freq: Every day | ORAL | Status: DC
  Administered 2013-07-22 – 2013-07-23 (×2): 80 mg via ORAL
  Filled 2013-07-22 (×3): qty 1

## 2013-07-22 MED ORDER — PREDNISONE 50 MG PO TABS
60.0000 mg | ORAL_TABLET | ORAL | Status: AC
  Administered 2013-07-23: 60 mg via ORAL
  Filled 2013-07-22: qty 1

## 2013-07-22 MED ORDER — DIPHENHYDRAMINE HCL 50 MG/ML IJ SOLN
25.0000 mg | INTRAMUSCULAR | Status: AC
  Administered 2013-07-23: 25 mg via INTRAVENOUS
  Filled 2013-07-22: qty 1

## 2013-07-22 MED ORDER — SODIUM CHLORIDE 0.9 % IV SOLN: 1.0000 mL/kg/h | INTRAVENOUS | Status: DC

## 2013-07-22 MED ORDER — PREDNISONE 50 MG PO TABS
60.0000 mg | ORAL_TABLET | ORAL | Status: AC
  Administered 2013-07-22: 60 mg via ORAL
  Filled 2013-07-22: qty 1

## 2013-07-22 MED ORDER — ATORVASTATIN CALCIUM 80 MG PO TABS
80.0000 mg | ORAL_TABLET | Freq: Every day | ORAL | Status: DC
  Filled 2013-07-22: qty 1

## 2013-07-22 MED ORDER — ASPIRIN 81 MG PO CHEW
81.0000 mg | CHEWABLE_TABLET | ORAL | Status: DC
  Filled 2013-07-22: qty 1

## 2013-07-22 MED ORDER — FAMOTIDINE IN NACL 20-0.9 MG/50ML-% IV SOLN
20.0000 mg | INTRAVENOUS | Status: DC
  Filled 2013-07-22 (×2): qty 50

## 2013-07-22 NOTE — Progress Notes (Signed)
Pt. Seen and examined. Agree with the NP/PA-C note as written.  Ruled in for AMI. Peak troponin of 16. [facility] Premedicate for dye allergy. He is currently chest pain free.  Chrystie NoseKenneth C. Hilty, MD, [facility]ee Hospital CenterFACC Attending Cardiologist [facility]

## 2013-07-22 NOTE — Progress Notes (Signed)
ANTICOAGULATION CONSULT NOTE Pharmacy Consult for heparin Indication: chest pain/ACS  Allergies  Allergen Reactions  . Iodine Shortness Of Breath and Swelling  . Shrimp [Shellfish Allergy] Anaphylaxis    Patient Measurements: Height: 6' (182.9 cm) Weight: 139 lb (63.05 kg) IBW/kg (Calculated) : 77.6  Vital Signs: Temp: 98.2 F (36.8 C) (07/12 0000) Temp src: Oral (07/12 0000) BP: 129/85 mmHg (07/12 0000) Pulse Rate: 55 (07/12 0000)  Labs:  Recent Labs  07/21/13 0530 07/21/13 1644 07/21/13 2210 07/22/13 0138  HGB 15.1 15.3  --  14.5  HCT 43.6 44.5  --  41.5  PLT 208 186  --  187  LABPROT 12.9  --   --   --   INR 0.97  --   --   --   HEPARINUNFRC  --   --   --  0.33  CREATININE 1.18 1.06  --  1.03  TROPONINI <0.30 4.44* 16.13*  --     Estimated Creatinine Clearance: 89.3 ml/min (by C-G formula based on Cr of 1.03).  Assessment: 38 y/o male with chest pain for heparin  Goal of Therapy:  Heparin level 0.3-0.7 units/ml Monitor platelets by anticoagulation protocol: Yes   Plan:  Increase Heparin 950 units/hr to keep within goal range Check heparin level in 6 hours.   Geannie RisenGreg Ada Woodbury, PharmD, BCPS  07/22/2013 2:57 AM

## 2013-07-22 NOTE — Progress Notes (Signed)
ANTICOAGULATION CONSULT NOTE - Follow Up Consult  Pharmacy Consult for Heparin Indication: chest pain/ACS  Allergies  Allergen Reactions  . Iodine Shortness Of Breath and Swelling  . Shrimp [Shellfish Allergy] Anaphylaxis    Patient Measurements: Height: 6' (182.9 cm) Weight: 132 lb 11.5 oz (60.2 kg) IBW/kg (Calculated) : 77.6 Heparin Dosing Weight: 60 kg  Vital Signs: Temp: 99.2 F (37.3 C) (07/12 0800) Temp src: Oral (07/12 0800) BP: 131/96 mmHg (07/12 0800) Pulse Rate: 72 (07/12 0800)  Labs:  Recent Labs  07/21/13 0530 07/21/13 1644 07/21/13 2210 07/22/13 0138 07/22/13 0340 07/22/13 1000  HGB 15.1 15.3  --  14.5 14.2  --   HCT 43.6 44.5  --  41.5 41.6  --   PLT 208 186  --  187 199  --   LABPROT 12.9  --   --   --   --   --   INR 0.97  --   --   --   --   --   HEPARINUNFRC  --   --   --  0.33  --  0.41  CREATININE 1.18 1.06  --  1.03  --   --   TROPONINI <0.30 4.44* 16.13*  --   --   --     Estimated Creatinine Clearance: 85.2 ml/min (by C-G formula based on Cr of 1.03).  Assessment:  Heparin level is therapeutic (0.41) on 950 units/hr, after increase from 800 units/hr overnight.  On IV Nitro.  Goal of Therapy:  Heparin level 0.3-0.7 units/ml Monitor platelets by anticoagulation protocol: Yes   Plan:   Continue heparin at 950 units/hr.  Daily heparin level and CBC while on heparin; next in am.  Dennie Fettersgan, Bartlett Enke Donovan, RPh Pager: [phone] 07/22/2013,12:06 PM

## 2013-07-22 NOTE — Progress Notes (Signed)
Patient: [name] / Admit Date: 07/21/2013 / Date of Encounter: 07/22/2013, 12:16 PM   Subjective: No CP since 10pm last night. No SOB. Asked a lot of good questions. Says he used to not care about his health but since he's gotten older, he's realized he needs to start.   Objective: Telemetry: NSR  Physical Exam: Blood pressure 131/96, pulse 72, temperature 99.2 F (37.3 C), temperature source Oral, resp. rate 16, height 6' (1.829 m), weight 132 lb 11.5 oz (60.2 kg), SpO2 100.00%. General: Well developed, well nourished thin AAM in no acute distress. Head: Normocephalic, atraumatic, sclera non-icteric, no xanthomas, nares are without discharge. Neck: Negative for carotid bruits. JVP not elevated. Lungs: Clear bilaterally to auscultation without wheezes, rales, or rhonchi. Breathing is unlabored. Heart: RRR S1 S2 without murmurs, rubs, or gallops.  Abdomen: Soft, non-tender, non-distended with normoactive bowel sounds. No rebound/guarding. Extremities: No clubbing or cyanosis. No edema. Distal pedal pulses are 2+ and equal bilaterally. Neuro: Alert and oriented X 3. Moves all extremities spontaneously. Psych:  Responds to questions appropriately with a normal affect.  EKG NSR 56bpm, incomplete RBBB, somewhat hyperacute T waves V3-V6, TWI avL, V2 (reviewed with MD)  Intake/Output Summary (Last 24 hours) at 07/22/13 1216 Last data filed at 07/22/13 1000  Gross per 24 hour  Intake 240.94 ml  Output    250 ml  Net  -9.06 ml    Inpatient Medications:  . ARIPiprazole  10 mg Oral Daily  . aspirin  81 mg Oral Daily  . carbamazepine  200 mg Oral Daily  . carbamazepine  400 mg Oral QHS  . pantoprazole  40 mg Oral Daily  . sodium chloride  3 mL Intravenous Q12H  . sucralfate  1 g Oral TID WC & HS   Infusions:  . heparin 950 Units/hr (07/22/13 0700)  . nitroGLYCERIN 5 mcg/min (07/22/13 0700)    Labs:  Recent Labs  07/21/13 0530 07/21/13 1644 07/22/13 0138  NA 139  --   138  K 3.8  --  4.3  CL 100  --  100  CO2 17*  --  24  GLUCOSE 139*  --  99  BUN 7  --  7  CREATININE 1.18 1.06 1.03  CALCIUM 8.8  --  9.0    Recent Labs  07/22/13 0138 07/22/13 0340  WBC 7.2 7.4  HGB 14.5 14.2  HCT 41.5 41.6  MCV 89.1 89.1  PLT 187 199    Recent Labs  07/21/13 0530 07/21/13 1644 07/21/13 2210  TROPONINI <0.30 4.44* 16.13*    Radiology/Studies:  Dg Chest Port 1 View  07/21/2013   CLINICAL DATA:  Chest pain and emesis.  EXAM: PORTABLE CHEST - 1 VIEW  COMPARISON:  02/05/2013  FINDINGS: Pulmonary hyperinflation. Vague nodular opacity over the right lung base probably represents prominent nipple shadow. The heart size and mediastinal contours are within normal limits. Both lungs are clear. The visualized skeletal structures are unremarkable.  IMPRESSION: No active disease.   Electronically Signed   By: William  Stevens M.D.   On: 07/21/2013 05:55     Assessment and Plan  38 y/o M with history of HTN, HL, CAD s/p  inferior STEMI s/p BMS to RCA 01/2007, cath 2011 without high grade obstruction, prior polysubstance abuse with h/o tobacco, marijuana and crack cocaine, prior noncompliance presents with CP and ruled in for NSTEMI.   1. Chest pain/NSTEMI - peak troponin 16 2. Hypertension 3. Hyperlipidemia 4. Bipolar disorder/depression with recent job loss,   currently good insight 5. Polysubstance abuse with h/o tobacco, marijuana and crack cocaine - + tobacco, UDS +THC only, counseled regarding cessation 6. NSVT 7. Prior ICM - EF 40% in 2011, 60% in 2012 8. Possible IV dye allergy  Continue heparin, aspirin, NTG gtt. Follow BP. Not on BB due to sinus bradycardia. Add statin. F/u troponin in AM. Plan cath in AM, sooner if pain recurs. Risks and benefits of cardiac catheterization have been discussed with the patient. These include bleeding, infection, kidney damage, stroke, heart attack, death. The patient understands these risks and is willing to proceed. Due to dye  allergy, will premedicate.   Signed, Sascha Baugher PA-C  

## 2013-07-22 NOTE — Progress Notes (Signed)
Utilization Review Completed.[name] T7/12/2013  

## 2013-07-23 ENCOUNTER — Encounter (HOSPITAL_COMMUNITY)
Admission: EM | Disposition: A | Discharge status: Home / Self Care | Payer: MEDICAID | Source: Home / Self Care | Attending: Internal Medicine

## 2013-07-23 DIAGNOSIS — I214 Non-ST elevation (NSTEMI) myocardial infarction: Secondary | ICD-10-CM

## 2013-07-23 DIAGNOSIS — I4729 Other ventricular tachycardia: Secondary | ICD-10-CM

## 2013-07-23 DIAGNOSIS — I2510 Atherosclerotic heart disease of native coronary artery without angina pectoris: Secondary | ICD-10-CM

## 2013-07-23 DIAGNOSIS — I472 Ventricular tachycardia: Secondary | ICD-10-CM

## 2013-07-23 HISTORY — PX: CARDIAC CATHETERIZATION: SHX172

## 2013-07-23 HISTORY — PX: LEFT HEART CATHETERIZATION WITH CORONARY ANGIOGRAM: SHX5451

## 2013-07-23 LAB — CBC
HEMATOCRIT: 41.7 % (ref 39.0–52.0)
Hemoglobin: 14.1 g/dL (ref 13.0–17.0)
MCH: 30.4 pg (ref 26.0–34.0)
MCHC: 33.8 g/dL (ref 30.0–36.0)
MCV: 89.9 fL (ref 78.0–100.0)
PLATELETS: 201 10*3/uL (ref 150–400)
RBC: 4.64 MIL/uL (ref 4.22–5.81)
RDW: 15.1 % (ref 11.5–15.5)
WBC: 6.1 10*3/uL (ref 4.0–10.5)

## 2013-07-23 LAB — BASIC METABOLIC PANEL
ANION GAP: 14 (ref 5–15)
BUN: 7 mg/dL (ref 6–23)
CALCIUM: 9.3 mg/dL (ref 8.4–10.5)
CO2: 27 meq/L (ref 19–32)
Chloride: 96 mEq/L (ref 96–112)
Creatinine, Ser: 1.15 mg/dL (ref 0.50–1.35)
GFR calc Af Amer: >90 mL/min (ref >90)
GFR calc non Af Amer: 81 mL/min — ABNORMAL LOW (ref >90)
Glucose, Bld: 100 mg/dL — ABNORMAL HIGH (ref 70–99)
Potassium: 4.7 mEq/L (ref 3.7–5.3)
Sodium: 137 mEq/L (ref 137–147)

## 2013-07-23 LAB — HEPARIN LEVEL (UNFRACTIONATED): Heparin Unfractionated: 0.71 IU/mL — ABNORMAL HIGH (ref 0.30–0.70)

## 2013-07-23 LAB — TROPONIN I: Troponin I: 4.2 ng/mL (ref <0.30)

## 2013-07-23 LAB — POCT ACTIVATED CLOTTING TIME: Activated Clotting Time: 551 seconds

## 2013-07-23 SURGERY — LEFT HEART CATHETERIZATION WITH CORONARY ANGIOGRAM
Anesthesia: LOCAL

## 2013-07-23 MED ORDER — MIDAZOLAM HCL 2 MG/2ML IJ SOLN
INTRAMUSCULAR | Status: AC
  Filled 2013-07-23: qty 2

## 2013-07-23 MED ORDER — VERAPAMIL HCL 2.5 MG/ML IV SOLN
INTRAVENOUS | Status: AC
  Filled 2013-07-23: qty 2

## 2013-07-23 MED ORDER — ZOLPIDEM TARTRATE 5 MG PO TABS
5.0000 mg | ORAL_TABLET | Freq: Every evening | ORAL | Status: DC | PRN
  Administered 2013-07-23 (×2): 5 mg via ORAL
  Filled 2013-07-23 (×2): qty 1

## 2013-07-23 MED ORDER — BIVALIRUDIN 250 MG IV SOLR
INTRAVENOUS | Status: AC
  Filled 2013-07-23: qty 250

## 2013-07-23 MED ORDER — FAMOTIDINE IN NACL 20-0.9 MG/50ML-% IV SOLN
INTRAVENOUS | Status: AC
  Filled 2013-07-23: qty 50

## 2013-07-23 MED ORDER — FENTANYL CITRATE 0.05 MG/ML IJ SOLN
INTRAMUSCULAR | Status: AC
  Filled 2013-07-23: qty 2

## 2013-07-23 MED ORDER — LIDOCAINE HCL (PF) 1 % IJ SOLN
INTRAMUSCULAR | Status: AC
  Filled 2013-07-23: qty 60

## 2013-07-23 MED ORDER — HEPARIN SODIUM (PORCINE) 1000 UNIT/ML IJ SOLN
INTRAMUSCULAR | Status: AC
  Filled 2013-07-23: qty 1

## 2013-07-23 MED ORDER — SODIUM CHLORIDE 0.9 % IV SOLN
1.0000 mL/kg/h | INTRAVENOUS | Status: AC
  Administered 2013-07-23: 10:00:00 1 mL/kg/h via INTRAVENOUS

## 2013-07-23 MED ORDER — CLOPIDOGREL BISULFATE 75 MG PO TABS
75.0000 mg | ORAL_TABLET | Freq: Every day | ORAL | Status: DC
  Administered 2013-07-24: 08:00:00 75 mg via ORAL
  Filled 2013-07-23: qty 1

## 2013-07-23 MED ORDER — NITROGLYCERIN 0.2 MG/ML ON CALL CATH LAB
INTRAVENOUS | Status: AC
  Filled 2013-07-23: qty 1

## 2013-07-23 MED ORDER — PRASUGREL HCL 10 MG PO TABS
ORAL_TABLET | ORAL | Status: AC
  Filled 2013-07-23: qty 6

## 2013-07-23 MED ORDER — HEPARIN (PORCINE) IN NACL 2-0.9 UNIT/ML-% IJ SOLN
INTRAMUSCULAR | Status: AC
  Filled 2013-07-23: qty 1500

## 2013-07-23 MED ORDER — NICOTINE 21 MG/24HR TD PT24
21.0000 mg | MEDICATED_PATCH | Freq: Every day | TRANSDERMAL | Status: DC
  Administered 2013-07-23 – 2013-07-24 (×2): 21 mg via TRANSDERMAL
  Filled 2013-07-23 (×2): qty 1

## 2013-07-23 NOTE — Interval H&P Note (Signed)
History and Physical Interval Note:  07/23/2013 8:30 AM  [name]  has presented today for surgery, with the diagnosis of chest pain  The various methods of treatment have been discussed with the patient and family. After consideration of risks, benefits and other options for treatment, the patient has consented to  Procedure(s): LEFT HEART CATHETERIZATION WITH CORONARY ANGIOGRAM (N/A) as a surgical intervention .  The patient's history has been reviewed, patient examined, no change in status, stable for surgery.  I have reviewed the patient's chart and labs.  Questions were answered to the patient's satisfaction.   Cath Lab Visit (complete for each Cath Lab visit)  Clinical Evaluation Leading to the Procedure:   ACS: Yes.    Non-ACS:    Anginal Classification: CCS III  Anti-ischemic medical therapy: Minimal Therapy (1 class of medications)  Non-Invasive Test Results: No non-invasive testing performed  Prior CABG: No previous CABG       Theron AristaPeter Surgical Specialty CenterJordanMD,FACC 07/23/2013 8:30 AM

## 2013-07-23 NOTE — H&P (View-Only) (Signed)
Pt. Seen and examined. Agree with the NP/PA-C note as written.  Ruled in for AMI. Peak troponin of 16. Plan for [facility] tomorrow. Premedicate for dye allergy. He is currently chest pain free.  Kenneth C. Hilty, MD, FACC Attending Cardiologist [facility]   

## 2013-07-23 NOTE — Progress Notes (Signed)
Denies of chest pain.  Transported to cath lab per stretcher with Kandice Hamsabitha Lawrence.

## 2013-07-23 NOTE — Progress Notes (Signed)
Subjective: No CP since yesterday  Objective: Vital signs in last 24 hours: Temp:  [97.8 F (36.6 C)-98.5 F (36.9 C)] 98.3 F (36.8 C) (07/13 0400) Pulse Rate:  [56-88] 67 (07/13 0400) Resp:  [14-22] 22 (07/13 0400) BP: (96-125)/(57-86) 96/57 mmHg (07/13 0400) SpO2:  [95 %-100 %] 98 % (07/13 0400) Weight:  [128 lb 12 oz (58.4 kg)] 128 lb 12 oz (58.4 kg) (07/13 0600) Last BM Date: 07/18/13  Intake/Output from previous day: 07/12 0701 - 07/13 0700 In: 563 [P.O.:180; I.V.:383] Out: 730 [Urine:730] Intake/Output this shift:    Medications Current Facility-Administered Medications  Medication Dose Route Frequency Provider Last Rate Last Dose  . 0.9 %  sodium chloride infusion  250 mL Intravenous PRN Chrystie NoseKenneth C. Hilty, MD      . 0.9 %  sodium chloride infusion  1 mL/kg/hr Intravenous Continuous Dayna N Dunn, PA-C 60.2 mL/hr at 07/23/13 0400 1 mL/kg/hr at 07/23/13 0400  . acetaminophen (TYLENOL) tablet 650 mg  650 mg Oral Q4H PRN Chrystie NoseKenneth C. Hilty, MD      . ARIPiprazole (ABILIFY) tablet 10 mg  10 mg Oral Daily Chrystie NoseKenneth C. Hilty, MD   10 mg at 07/22/13 0934  . aspirin chewable tablet 81 mg  81 mg Oral Daily Chrystie NoseKenneth C. Hilty, MD   81 mg at 07/23/13 0644  . aspirin chewable tablet 81 mg  81 mg Oral Pre-Cath Dayna N Dunn, PA-C      . atorvastatin (LIPITOR) tablet 80 mg  80 mg Oral q1800 Dayna N Dunn, PA-C   80 mg at 07/22/13 1711  . carbamazepine (TEGRETOL) tablet 200 mg  200 mg Oral Daily Chrystie NoseKenneth C. Hilty, MD   200 mg at 07/22/13 0934  . carbamazepine (TEGRETOL) tablet 400 mg  400 mg Oral QHS Chrystie NoseKenneth C. Hilty, MD   400 mg at 07/22/13 2246  . diphenhydrAMINE (BENADRYL) injection 25 mg  25 mg Intravenous Pre-Cath Dayna N Dunn, PA-C      . famotidine (PEPCID) IVPB 20 mg  20 mg Intravenous Pre-Cath Dayna N Dunn, PA-C      . heparin ADULT infusion 100 units/mL (25000 units/250 mL)  950 Units/hr Intravenous Continuous Chrystie NoseKenneth C. Hilty, MD 9.5 mL/hr at 07/23/13 0000 950 Units/hr at 07/23/13  0000  . morphine 2 MG/ML injection 2 mg  2 mg Intravenous Q4H PRN Chrystie NoseKenneth C. Hilty, MD   2 mg at 07/21/13 1622  . nitroGLYCERIN (NITROSTAT) SL tablet 0.4 mg  0.4 mg Sublingual Q5 Min x 3 PRN Chrystie NoseKenneth C. Hilty, MD      . nitroGLYCERIN 0.2 mg/mL in dextrose 5 % infusion  2-200 mcg/min Intravenous Titrated Rhonda G Barrett, PA-C 1.5 mL/hr at 07/23/13 0000 5 mcg/min at 07/23/13 0000  . ondansetron (ZOFRAN) injection 4 mg  4 mg Intravenous Q6H PRN Chrystie NoseKenneth C. Hilty, MD   4 mg at 07/21/13 1621  . ondansetron (ZOFRAN) tablet 4 mg  4 mg Oral Q8H PRN Chrystie NoseKenneth C. Hilty, MD      . pantoprazole (PROTONIX) EC tablet 40 mg  40 mg Oral Daily Chrystie NoseKenneth C. Hilty, MD   40 mg at 07/22/13 0933  . predniSONE (DELTASONE) tablet 60 mg  60 mg Oral Pre-Cath Dayna N Dunn, PA-C      . sodium chloride 0.9 % injection 3 mL  3 mL Intravenous Q12H Chrystie NoseKenneth C. Hilty, MD   3 mL at 07/22/13 2250  . sodium chloride 0.9 % injection 3 mL  3 mL Intravenous PRN Chrystie NoseKenneth C. Hilty, MD      .  sucralfate (CARAFATE) 1 GM/10ML suspension 1 g  1 g Oral TID WC & HS Chrystie Nose, MD   1 g at 07/22/13 2248  . zolpidem (AMBIEN) tablet 5 mg  5 mg Oral QHS PRN Quintella Reichert, MD   5 mg at 07/23/13 0114    PE: General appearance: alert, cooperative and no distress Lungs: clear to auscultation bilaterally Heart: regular rate and rhythm, S1, S2 normal, no murmur, click, rub or gallop Extremities: No LEE Pulses: 2+ and symmetric Skin: Warm and dry Neurologic: Grossly normal  Lab Results:   Recent Labs  07/22/13 0138 07/22/13 0340 07/23/13 0342  WBC 7.2 7.4 6.1  HGB 14.5 14.2 14.1  HCT 41.5 41.6 41.7  PLT 187 199 201   BMET  Recent Labs  07/21/13 0530 07/21/13 1644 07/22/13 0138 07/23/13 0342  NA 139  --  138 137  K 3.8  --  4.3 4.7  CL 100  --  100 96  CO2 17*  --  24 27  GLUCOSE 139*  --  99 100*  BUN 7  --  7 7  CREATININE 1.18 1.06 1.03 1.15  CALCIUM 8.8  --  9.0 9.3   PT/INR  Recent Labs  07/21/13 0530  LABPROT  12.9  INR 0.97   Cholesterol  Recent Labs  07/22/13 0138  CHOL 284*   Lipid Panel     Component Value Date/Time   CHOL 284* 07/22/2013 0138   TRIG 82 07/22/2013 0138   HDL 60 07/22/2013 0138   CHOLHDL 4.7 07/22/2013 0138   VLDL 16 07/22/2013 0138   LDLCALC 208* 07/22/2013 0138    Assessment/Plan  38 y/o M with history of HTN, HL, CAD s/p inferior STEMI s/p BMS to RCA 01/2007, cath 2011 without high grade obstruction, prior polysubstance abuse with h/o tobacco, marijuana and crack cocaine, prior noncompliance presents with CP and ruled in for NSTEMI.   1. Chest pain/NSTEMI - peak troponin 16.13 2. Hypertension  3. Hyperlipidemia  4. Bipolar disorder/depression with recent job loss, currently good insight  5. Polysubstance abuse with h/o tobacco, marijuana and crack cocaine - + tobacco, UDS +THC only, counseled regarding cessation  6. NSVT  7. Prior ICM - EF 40% in 2011, 60% in 2012  8. Possible IV dye allergy  Plan:   No further CP.  On ASA, lipitor, IV heparin.  BP on the low side this morning but stable.  LDL 208.  He reports he can not afford his meds.  + marijuana.  Left heart cath today.     LOS: 2 days    HAGER, BRYAN PA-C 07/23/2013 8:07 AM  Seen postoperatively on 6C.  Doing well after PCI of distal right coronary artery.  Patient received angioplasty and cutting balloon but no stent.  No stent because of prior history of noncompliance with medical therapy. The patient states that he has no insurance or means of obtaining medication.  We will ask care manager to see him to see what assistance could be obtained.

## 2013-07-23 NOTE — CV Procedure (Signed)
    Cardiac Catheterization Procedure Note  Name: [name] MRN: [id number] DOB: 23-Jul-1975  Procedure: Left Heart Cath, Selective Coronary Angiography, LV angiography, PTCA  of the distal RCA  Indication:37 yo BM with prior BMS of the distal RCA in 2009 presents with a NSTEMI.  Procedural Details:  The right wrist was prepped, draped, and anesthetized with 1% lidocaine. Using the modified Seldinger technique, a 6 French slender sheath was introduced into the right radial artery. 3 mg of verapamil was administered through the sheath, weight-based unfractionated heparin was administered intravenously. Standard Judkins catheters were used for selective coronary angiography and left ventriculography. Catheter exchanges were performed over an exchange length guidewire.  PROCEDURAL FINDINGS Hemodynamics: AO 101/65 mean of 84 mm Hg LV 101/8 mm Hg   Coronary angiography: Coronary dominance: right  Left mainstem: Normal  Left anterior descending (LAD): The LAD has diffuse mild disease less than 20%. There is ectasia in the proximal and mid vessel. The diagonals are small. The first diagonal is occluded proximally. The second diagonal has 70% stenosis at the ostium.   Left circumflex (LCx): The LCx is a large vessel giving rise to  2 OM branches. There is diffuse ectasia. There is a 50% stenosis in the mid LCx.   Right coronary artery (RCA): The RCA has diffuse disease in the proximal to mid RCA up to 30%. The distal RCA has a focal 80-90% stenosis in the proximal stent. The PDA has segmental disease proximally up to 40%  Left ventriculography: Left ventricular systolic function is normal, LVEF is estimated at 55%, there is no significant mitral regurgitation   PCI Note:  Following the diagnostic procedure, the decision was made to proceed with PCI of the distal RCA.  Weight-based bivalirudin was given for anticoagulation. Once a therapeutic ACT was achieved, a 6 [place] FR4 guide catheter  was inserted.  A prowater coronary guidewire was used to cross the lesion.  The lesion was predilated with a 3.0 mm balloon.  The lesion was then treated with a a 3.5 x 10 mm Cutting balloon up to 10 atm.   Following PCI, there was less than 10% residual stenosis and TIMI-3 flow. Given patient's history of noncompliance with medical therapy I did not restent this vessel.  Final angiography confirmed an excellent result. The patient tolerated the procedure well. There were no immediate procedural complications. A TR band was used for radial hemostasis. The patient was transferred to the post catheterization recovery area for further monitoring.  PCI Data: Vessel - RCA/Segment - distal Percent Stenosis (pre)  80-90% TIMI-flow 3 Cutting balloon angioplasty Percent Stenosis (post) less than 10% TIMI-flow (post) 3  Final Conclusions:   1. Two vessel obstructive CAD. Occluded first diagonal. Focal restenosis in Stent in distal RCA 2. Normal LV function.   Recommendations:  Would continue DAPT with ASA and Plavix for one year with NSTEMI. Patient needs aggressive risk factor modification. Stressed the importance of compliance with medical therapy and smoking cessation.  Ana Woodroof [place], [place] 07/23/2013, 9:35 AM

## 2013-07-23 NOTE — H&P (View-Only) (Signed)
Patient: [name] / Admit Date: 07/21/2013 / Date of Encounter: 07/22/2013, 12:16 PM   Subjective: No CP since 10pm last night. No SOB. Asked a lot of good questions. Says he used to not care about his health but since he's gotten older, he's realized he needs to start.   Objective: Telemetry: NSR  Physical Exam: Blood pressure 131/96, pulse 72, temperature 99.2 F (37.3 C), temperature source Oral, resp. rate 16, height 6' (1.829 m), weight 132 lb 11.5 oz (60.2 kg), SpO2 100.00%. General: Well developed, well nourished thin AAM in no acute distress. Head: Normocephalic, atraumatic, sclera non-icteric, no xanthomas, nares are without discharge. Neck: Negative for carotid bruits. JVP not elevated. Lungs: Clear bilaterally to auscultation without wheezes, rales, or rhonchi. Breathing is unlabored. Heart: RRR S1 S2 without murmurs, rubs, or gallops.  Abdomen: Soft, non-tender, non-distended with normoactive bowel sounds. No rebound/guarding. Extremities: No clubbing or cyanosis. No edema. Distal pedal pulses are 2+ and equal bilaterally. Neuro: Alert and oriented X 3. Moves all extremities spontaneously. Psych:  Responds to questions appropriately with a normal affect.  EKG NSR 56bpm, incomplete RBBB, somewhat hyperacute T waves V3-V6, TWI avL, V2 (reviewed with MD)  Intake/Output Summary (Last 24 hours) at 07/22/13 1216 Last data filed at 07/22/13 1000  Gross per 24 hour  Intake 240.94 ml  Output    250 ml  Net  -9.06 ml    Inpatient Medications:  . ARIPiprazole  10 mg Oral Daily  . aspirin  81 mg Oral Daily  . carbamazepine  200 mg Oral Daily  . carbamazepine  400 mg Oral QHS  . pantoprazole  40 mg Oral Daily  . sodium chloride  3 mL Intravenous Q12H  . sucralfate  1 g Oral TID WC & HS   Infusions:  . heparin 950 Units/hr (07/22/13 0700)  . nitroGLYCERIN 5 mcg/min (07/22/13 0700)    Labs:  Recent Labs  07/21/13 0530 07/21/13 1644 07/22/13 0138  NA 139  --   138  K 3.8  --  4.3  CL 100  --  100  CO2 17*  --  24  GLUCOSE 139*  --  99  BUN 7  --  7  CREATININE 1.18 1.06 1.03  CALCIUM 8.8  --  9.0    Recent Labs  07/22/13 0138 07/22/13 0340  WBC 7.2 7.4  HGB 14.5 14.2  HCT 41.5 41.6  MCV 89.1 89.1  PLT 187 199    Recent Labs  07/21/13 0530 07/21/13 1644 07/21/13 2210  TROPONINI <0.30 4.44* 16.13*    Radiology/Studies:  Dg Chest Port 1 View  07/21/2013   CLINICAL DATA:  Chest pain and emesis.  EXAM: PORTABLE CHEST - 1 VIEW  COMPARISON:  02/05/2013  FINDINGS: Pulmonary hyperinflation. Vague nodular opacity over the right lung base probably represents prominent nipple shadow. The heart size and mediastinal contours are within normal limits. Both lungs are clear. The visualized skeletal structures are unremarkable.  IMPRESSION: No active disease.   Electronically Signed   By: Burman Nieves M.D.   On: 07/21/2013 05:55     Assessment and Plan  38 y/o M with history of HTN, HL, CAD s/p  inferior STEMI s/p BMS to RCA 01/2007, cath 2011 without high grade obstruction, prior polysubstance abuse with h/o tobacco, marijuana and crack cocaine, prior noncompliance presents with CP and ruled in for NSTEMI.   1. Chest pain/NSTEMI - peak troponin 16 2. Hypertension 3. Hyperlipidemia 4. Bipolar disorder/depression with recent job loss,  currently good insight 5. Polysubstance abuse with h/o tobacco, marijuana and crack cocaine - + tobacco, UDS +THC only, counseled regarding cessation 6. NSVT 7. Prior ICM - EF 40% in 2011, 60% in 2012 8. Possible IV dye allergy  Continue heparin, aspirin, NTG gtt. Follow BP. Not on BB due to sinus bradycardia. Add statin. F/u troponin in AM. Plan cath in AM, sooner if pain recurs. Risks and benefits of cardiac catheterization have been discussed with the patient. These include bleeding, infection, kidney damage, stroke, heart attack, death. The patient understands these risks and is willing to proceed. Due to dye  allergy, will premedicate.   Signed, Ronie Spiesayna Dunn PA-C

## 2013-07-23 NOTE — Progress Notes (Signed)
TR BAND REMOVAL  LOCATION:    right radial  DEFLATED PER PROTOCOL:    Yes.    TIME BAND OFF / DRESSING APPLIED:    1400   SITE UPON ARRIVAL:    Level 0  SITE AFTER BAND REMOVAL:    Level 0  REVERSE ALLEN'S TEST:     positive  CIRCULATION SENSATION AND MOVEMENT:    Within Normal Limits   Yes.    COMMENTS:   Rechecked site at 1430 with no change in assessment. Dressing dry and intact, CSMs wnls and radial/ulnar pulses +2.

## 2013-07-24 ENCOUNTER — Telehealth: Payer: Self-pay | Admitting: Cardiovascular Disease

## 2013-07-24 ENCOUNTER — Encounter (HOSPITAL_COMMUNITY): Payer: Self-pay | Admitting: Physician Assistant

## 2013-07-24 DIAGNOSIS — Z7189 Other specified counseling: Secondary | ICD-10-CM

## 2013-07-24 DIAGNOSIS — F17200 Nicotine dependence, unspecified, uncomplicated: Secondary | ICD-10-CM

## 2013-07-24 LAB — CBC
HCT: 39.5 % (ref 39.0–52.0)
Hemoglobin: 13.6 g/dL (ref 13.0–17.0)
MCH: 30.4 pg (ref 26.0–34.0)
MCHC: 34.4 g/dL (ref 30.0–36.0)
MCV: 88.2 fL (ref 78.0–100.0)
Platelets: 207 10*3/uL (ref 150–400)
RBC: 4.48 MIL/uL (ref 4.22–5.81)
RDW: 14.5 % (ref 11.5–15.5)
WBC: 7.4 10*3/uL (ref 4.0–10.5)

## 2013-07-24 LAB — BASIC METABOLIC PANEL
Anion gap: 13 (ref 5–15)
BUN: 7 mg/dL (ref 6–23)
CHLORIDE: 98 meq/L (ref 96–112)
CO2: 27 meq/L (ref 19–32)
Calcium: 9 mg/dL (ref 8.4–10.5)
Creatinine, Ser: 1.04 mg/dL (ref 0.50–1.35)
GFR calc Af Amer: >90 mL/min (ref >90)
GFR calc non Af Amer: >90 mL/min (ref >90)
Glucose, Bld: 89 mg/dL (ref 70–99)
POTASSIUM: 3.8 meq/L (ref 3.7–5.3)
Sodium: 138 mEq/L (ref 137–147)

## 2013-07-24 MED ORDER — LISINOPRIL 5 MG PO TABS: 5.0000 mg | ORAL_TABLET | Freq: Every day | ORAL | Status: DC

## 2013-07-24 MED ORDER — LISINOPRIL 5 MG PO TABS
5.0000 mg | ORAL_TABLET | Freq: Every day | ORAL | Status: DC
  Administered 2013-07-24: 10:00:00 5 mg via ORAL
  Filled 2013-07-24: qty 1

## 2013-07-24 MED ORDER — CLOPIDOGREL BISULFATE 75 MG PO TABS: 75.0000 mg | ORAL_TABLET | Freq: Every day | ORAL | Status: DC

## 2013-07-24 MED ORDER — NITROGLYCERIN 0.4 MG SL SUBL: 0.4000 mg | SUBLINGUAL_TABLET | SUBLINGUAL | Status: DC | PRN

## 2013-07-24 MED ORDER — ATORVASTATIN CALCIUM 80 MG PO TABS: 80.0000 mg | ORAL_TABLET | Freq: Every evening | ORAL | Status: DC

## 2013-07-24 MED FILL — Sodium Chloride IV Soln 0.9%: INTRAVENOUS | Qty: 50 | Status: AC

## 2013-07-24 NOTE — Discharge Summary (Signed)
Discharge Summary   Patient ID: RYLAND TUNGATE MRN: [id number], DOB/AGE: December 29, 1975 38 y.o. Admit date: 07/21/2013 D/C date:     07/24/2013  Primary Care Provider: No PCP Per Patient Primary Cardiologist: Hilty's note indicates new, he was the first to see this admission  Primary Discharge Diagnoses:  1. CAD with NSTEMI - s/p PTCA to RCA only this admission, EF 55% - history: inferior STEMI s/p BMS to RCA 01/2007 2. Hypertension  3. Hyperlipidemia  4. Bipolar disorder/depression with recent job loss, currently good insight  5. Polysubstance abuse with h/o tobacco, marijuana and crack cocaine + tobacco, UDS +THC only, counseled regarding cessation  6. NSVT, very brief 7. Prior ICM - EF 40% in 2011, 60% in 2012, EF 55% by cath this admission 8. Possible IV dye allergy 9. Hx of med noncompliance  Hospital Course: Mr. Fridman is a 38 y/o M with history of CAD s/p interior STEMI s/p BMS to RCA 01/2007, HTN, HL, bipolar disorder/depression with recent job loss, prior h/o  tobacco, marijuana and crack cocaine (UDS + THC only, continues to smoke), hx of med noncompliance, prior ICM with varying EF who presented to [facility] with chest pressure. He was awakened the morning of discharge with this discomfort. It felt like a squeezing/pressure symptoms. It was thought in the past the symptoms were more likely GI given other complaints of intermittent nausea/vomiting however he never made an appointment with the gastroenterologist. He was also not compliant with follow-up outpatient testing. In the ED, EKG was nonacute. He was admitted to the hospital for further evaluation and ruled in for a NSTEMI with peak troponin of 16. He was pain free after the day of admission. He was placed on IV heparin and NTG and observed over the weekend in prep for cath on Monday (yesterday). He had brief NSVT on telemetry felt related to his infarct. During hospital stay the patient had a lot of good questions and  expressed that he was working hard to turn his life around. He was pre-medicated for possible IV dye allergy. He underwent cardiac cath 07/23/13 demonstrating two vessel obstructive CAD with occluded first diagonal and focal restenosis instent in distal RCA. Given patient's history of noncompliance with medical therapy Dr. Martinique treated the vessel with cutting balloon angioplasty and did not restent this vessel. Recommendation was to continue ASA and Plavix for 1 yr. He is not on BB due to h/o cocaine use and uncertain risk for recurrent use. The patient was seen by care management and financial counselor for financial assistance. Marijuana/tobacco cessation was also advised. Dr. Ellyn Hack has seen and examined the patient today and feels he is stable for discharge. Due to elevation in BP, lisinopril was added. The patient will follow up in the office for next-available transition-of-care visit - our schedulers only had 7/30 available on ALL schedules, but have added him to a cancellation list to be seen sooner. Would consider BMET at that time given ACEI initiation. If he returns for followup will also need scheduling of lipids/LFTs in 6-8 weeks given statin initiation. LDL was 208.  Discharge Vitals: Blood pressure 117/90, pulse 58, temperature 98.7 F (37.1 C), temperature source Oral, resp. rate 18, height 6' (1.829 m), weight 130 lb 1.1 oz (59 kg), SpO2 100.00%.  Labs: Lab Results  Component Value Date   WBC 7.4 07/24/2013   HGB 13.6 07/24/2013   HCT 39.5 07/24/2013   MCV 88.2 07/24/2013   PLT 207 07/24/2013    Recent  Labs Lab 07/22/13 0140  07/24/13 0358  NA  --   < > 138  K  --   < > 3.8  CL  --   < > 98  CO2  --   < > 27  BUN  --   < > 7  CREATININE  --   < > 1.04  CALCIUM  --   < > 9.0  PROT 6.0  --   --   BILITOT 0.5  --   --   ALKPHOS 62  --   --   ALT 32  --   --   AST 88*  --   --   GLUCOSE  --   < > 89  < > = values in this interval not displayed.  Recent Labs  07/21/13 1644  07/21/13 2210 07/23/13 0342  TROPONINI 4.44* 16.13* 4.20*   Lab Results  Component Value Date   CHOL 284* 07/22/2013   HDL 60 07/22/2013   LDLCALC 208* 07/22/2013   TRIG 82 07/22/2013     Diagnostic Studies/Procedures   Dg Chest Port 1 View 07/21/2013   CLINICAL DATA:  Chest pain and emesis.  EXAM: PORTABLE CHEST - 1 VIEW  COMPARISON:  02/05/2013  FINDINGS: Pulmonary hyperinflation. Vague nodular opacity over the right lung base probably represents prominent nipple shadow. The heart size and mediastinal contours are within normal limits. Both lungs are clear. The visualized skeletal structures are unremarkable.  IMPRESSION: No active disease.   Electronically Signed   By: Lucienne Capers M.D.   On: 07/21/2013 05:55   Cardiac Cath 07/23/13 Cardiac Catheterization Procedure Note  Name: ARIANA CAVENAUGH  MRN: [id number]  DOB: [birth year]  Procedure: Left Heart Cath, Selective Coronary Angiography, LV angiography, PTCA of the distal RCA  Indication:38 yo BM with prior BMS of the distal RCA in 2009 presents with a NSTEMI.  Procedural Details: The right wrist was prepped, draped, and anesthetized with 1% lidocaine. Using the modified Seldinger technique, a 6 French slender sheath was introduced into the right radial artery. 3 mg of verapamil was administered through the sheath, weight-based unfractionated heparin was administered intravenously. Standard Judkins catheters were used for selective coronary angiography and left ventriculography. Catheter exchanges were performed over an exchange length guidewire.  PROCEDURAL FINDINGS  Hemodynamics:  AO 101/65 mean of 84 mm Hg  LV 101/8 mm Hg  Coronary angiography:  Coronary dominance: right  Left mainstem: Normal  Left anterior descending (LAD): The LAD has diffuse mild disease less than 20%. There is ectasia in the proximal and mid vessel. The diagonals are small. The first diagonal is occluded proximally. The second diagonal has 70% stenosis at the  ostium.  Left circumflex (LCx): The LCx is a large vessel giving rise to 2 OM branches. There is diffuse ectasia. There is a 50% stenosis in the mid LCx.  Right coronary artery (RCA): The RCA has diffuse disease in the proximal to mid RCA up to 30%. The distal RCA has a focal 80-90% stenosis in the proximal stent. The PDA has segmental disease proximally up to 40%  Left ventriculography: Left ventricular systolic function is normal, LVEF is estimated at 55%, there is no significant mitral regurgitation  PCI Note: Following the diagnostic procedure, the decision was made to proceed with PCI of the distal RCA. Weight-based bivalirudin was given for anticoagulation. Once a therapeutic ACT was achieved, a 6 [place] FR4 guide catheter was inserted. A prowater coronary guidewire was used to cross the lesion. The  lesion was predilated with a 3.0 mm balloon. The lesion was then treated with a a 3.5 x 10 mm Cutting balloon up to 10 atm. Following PCI, there was less than 10% residual stenosis and TIMI-3 flow. Given patient's history of noncompliance with medical therapy I did not restent this vessel. Final angiography confirmed an excellent result. The patient tolerated the procedure well. There were no immediate procedural complications. A TR band was used for radial hemostasis. The patient was transferred to the post catheterization recovery area for further monitoring.  PCI Data:  Vessel - RCA/Segment - distal  Percent Stenosis (pre) 80-90%  TIMI-flow 3  Cutting balloon angioplasty  Percent Stenosis (post) less than 10%  TIMI-flow (post) 3  Final Conclusions:  1. Two vessel obstructive CAD. Occluded first diagonal. Focal restenosis in Stent in distal RCA  2. Normal LV function.  Recommendations:  Would continue DAPT with ASA and Plavix for one year with NSTEMI. Patient needs aggressive risk factor modification. Stressed the importance of compliance with medical therapy and smoking cessation.  Peter Martinique,  Jones  07/23/2013, 9:35 AM    Discharge Medications   Current Discharge Medication List    START taking these medications   Details  atorvastatin (LIPITOR) 80 MG tablet Take 1 tablet (80 mg total) by mouth every evening. Qty: 30 tablet, Refills: 6    clopidogrel (PLAVIX) 75 MG tablet Take 1 tablet (75 mg total) by mouth daily. Qty: 30 tablet, Refills: 11    lisinopril (PRINIVIL,ZESTRIL) 5 MG tablet Take 1 tablet (5 mg total) by mouth daily. Qty: 30 tablet, Refills: 6    nitroGLYCERIN (NITROSTAT) 0.4 MG SL tablet Place 1 tablet (0.4 mg total) under the tongue every 5 (five) minutes as needed for chest pain (up to 3 doses). Qty: 25 tablet, Refills: 3      CONTINUE these medications which have NOT CHANGED   Details  ARIPiprazole (ABILIFY) 10 MG tablet Take 10 mg by mouth daily.    aspirin 81 MG tablet Take 81 mg by mouth daily.    carbamazepine (TEGRETOL) 200 MG tablet Take 200-400 mg by mouth 2 (two) times daily. 235m in AM, 4025mQHS    famotidine (PEPCID) 20 MG tablet Take 1 tablet (20 mg total) by mouth 2 (two) times daily.     ondansetron (ZOFRAN) 4 MG tablet Take 1 tablet (4 mg total) by mouth every 8 (eight) hours as needed for nausea or vomiting.        Disposition   The patient will be discharged in stable condition to home. Discharge Instructions   Amb Referral to Cardiac Rehabilitation    Complete by:  As directed      Diet - low sodium heart healthy    Complete by:  As directed      Increase activity slowly    Complete by:  As directed   No driving for 1 week. No lifting over 10 lbs for 2 weeks. No sexual activity for 2 weeks. Keep procedure site clean & dry. If you notice increased pain, swelling, bleeding or pus, call/return!  You may shower, but no soaking baths/hot tubs/pools for 1 week.  The plan is for you to be on Aspirin and Plavix for 1 year, then Aspirin indefinitely after that.          Follow-up Information   Follow up with SIMMONS,  BRITTAINY, PA-C. ([facility] - 08/09/13 at 2pm)    Specialty:  Cardiology   Contact information:   [address]  [place] [place] [address] [phone]         Duration of Discharge Encounter: Greater than 30 minutes including physician and PA time.  Signed, Melina Copa PA-C 07/24/2013, 11:12 AM  Patient seen and examined this morning. See last August note for full details. I agree with the discharge summary that was discussed with Ms Idolina Primer, Hershal Coria.  Stable for discharge.  Leonie Man, M.D., M.S. Interventional Cardiologist   Pager # [phone] 07/24/2013

## 2013-07-24 NOTE — Progress Notes (Signed)
Patient: [name] / Admit Date: 07/21/2013 / Date of Encounter: 07/24/2013, 6:25 AM   Subjective: Pt feels good this morning.  Denies chest pain, headache, nausea, vomiting, pain at the Rt radial cath site.  He is wondering if he can go home today.   Objective: Telemetry: NSR Physical Exam: Blood pressure 132/91, pulse 55, temperature 98.7 F (37.1 C), temperature source Oral, resp. rate 20, height 6' (1.829 m), weight 130 lb 1.1 oz (59 kg), SpO2 100.00%. General: Well developed, thin, in no acute distress. Head: Normocephalic, atraumatic, sclera non-icteric, no xanthomas, nares are without discharge. Neck: Negative for carotid bruits. JVP not elevated. Lungs: distant lung sounds. Clear bilaterally to auscultation without wheezes, rales, or rhonchi. Breathing is unlabored. Heart: quiet heart sounds, RRR S1 S2 without murmurs, rubs, or gallops.  Abdomen: Soft, non-tender, non-distended with normoactive bowel sounds. No rebound/guarding. Extremities: No clubbing or cyanosis. No edema. Distal pedal pulses are 2+ and equal bilaterally. Neuro: Alert and oriented X 3. Moves all extremities spontaneously. Psych:  Responds to questions appropriately with a normal affect. Cath site: no rash, erythema or bruising.  Radial pulse intact.  Fingers are warm and pink with good capillary refill.   Intake/Output Summary (Last 24 hours) at 07/24/13 0625 Last data filed at 07/24/13 0140  Gross per 24 hour  Intake 1229.2 ml  Output   2275 ml  Net -1045.8 ml    Inpatient Medications:  . ARIPiprazole  10 mg Oral Daily  . aspirin  81 mg Oral Daily  . atorvastatin  80 mg Oral q1800  . carbamazepine  200 mg Oral Daily  . carbamazepine  400 mg Oral QHS  . clopidogrel  75 mg Oral Q breakfast  . nicotine  21 mg Transdermal Daily  . pantoprazole  40 mg Oral Daily  . sodium chloride  3 mL Intravenous Q12H  . sucralfate  1 g Oral TID WC & HS   Labs:  Recent Labs  07/22/13 0140 07/23/13 0342  07/24/13 0358  NA  --  137 138  K  --  4.7 3.8  CL  --  96 98  CO2  --  27 27  GLUCOSE  --  100* 89  BUN  --  7 7  CREATININE  --  1.15 1.04  CALCIUM  --  9.3 9.0  MG 1.9  --   --     Recent Labs  07/22/13 0140  AST 88*  ALT 32  ALKPHOS 62  BILITOT 0.5  PROT 6.0  ALBUMIN 3.3*    Recent Labs  07/23/13 0342 07/24/13 0358  WBC 6.1 7.4  HGB 14.1 13.6  HCT 41.7 39.5  MCV 89.9 88.2  PLT 201 207    Recent Labs  07/21/13 1644 07/21/13 2210 07/23/13 0342  TROPONINI 4.44* 16.13* 4.20*   No components found with this basename: POCBNP,  No results found for this basename: HGBA1C,  in the last 72 hours   Procedure: Left Heart Cath (Rt radial): Selective Coronary Angiography, LV angiography, PTCA of the distal RCA -  Prior BMS of the distal RCA in 2009 presents with a NSTEMI - Coronary dominance: right  - Left mainstem: Normal  - Left anterior descending (LAD): The LAD has diffuse mild disease less than 20%. There is ectasia in the proximal and mid vessel. The diagonals are small. The first diagonal is occluded proximally. The second diagonal has 70% stenosis at the ostium.  - Left circumflex (LCx): The LCx is a large vessel  giving rise to 2 OM branches. There is diffuse ectasia. There is a 50% stenosis in the mid LCx.  - Right coronary artery (RCA): The RCA has diffuse disease in the proximal to mid RCA up to 30%. The distal RCA has a focal 80-90% stenosis in the proximal stent. The PDA has segmental disease proximally up to 40%  - Left ventriculography: Left ventricular systolic function is normal, LVEF is estimated at 55%, there is no significant mitral regurgitation   PCI Data:  Vessel - RCA/Segment - distal  Percent Stenosis (pre) 80-90%  TIMI-flow 3  Cutting balloon angioplasty  Percent Stenosis (post) less than 10%  TIMI-flow (post) 3  - Given patient's history of noncompliance with medical therapy I did not restent this vessel - Would continue DAPT with ASA  and Plavix for one year with NSTEMI. Patient needs aggressive risk factor modification. Stressed the importance of compliance with medical therapy and smoking cessation    Radiology/Studies:  Dg Chest Port 1 View  07/21/2013   CLINICAL DATA:  Chest pain and emesis.  EXAM: PORTABLE CHEST - 1 VIEW  COMPARISON:  02/05/2013  FINDINGS: Pulmonary hyperinflation. Vague nodular opacity over the right lung base probably represents prominent nipple shadow. The heart size and mediastinal contours are within normal limits. Both lungs are clear. The visualized skeletal structures are unremarkable.  IMPRESSION: No active disease.   Electronically Signed   By: Burman Nieves M.D.   On: 07/21/2013 05:55     Assessment and Plan  38 yo with PMH significant for HTN, HLD, CAD s/p inferior STEMI s/p BMS to RCA 01/2007, cath 2011 without high grade obstruction (EF 20-30% and patent stent in the LAD, f/u echo with EF of 40%), biplolar depression (admission 01/2013 for suicidal ideations), prior polysubstance abuse: tobacco, marijuana, crack cocaine, hx of noncompliance, presented with chest pain and ruled in for NSTEMI with a peak troponin of 16.13  1. NSTEMI - Post-MI care:  Cr/BUN stable 1.04/7,  Patient without chest pain, NSR on telemetry.  - Pt will continue on 81mg  aspirin, 80mg  atorvastatin, 75mg  plavix - discussed importance of medication compliance  2. HTN: Blood pressure elevated overnight to 178/105, pulse 55.  Pt should be discharged on an anti-hypertensive agent.  Avoid beta-blockers due to hx of cocaine abuse.  - lisinopril 5mg  starting dose for BP control due to cardioprotective effects.   3. HLD: see above  4. Polysubstance abuse - discussed risks of substance abuse and smoking cessation in general and with his cardiac history. ( ) - give education with discharge instructions.    Singed, Julia Constant PA-S - pt seen and examined with Dr. Herbie Baltimore  I saw and examined the patient this morning  with the patient along with [facility] PA-S . I personally performed the examination. Admitted with non-STEMI, noted to have a in-stent restenosis treated with PTCA. No longer having chest pain. Blood pressure is more stable. We'll initiate ACE inhibitor for cardioprotection and hypertension. Also initiate statin.  We'll need to see him in follow-up as TCM - 7.  He is angulated already without any difficulty. He is stable for discharge.  Marykay Lex, M.D., M.S. Interventional Cardiologist   Pager # [phone] 07/24/2013

## 2013-07-24 NOTE — Progress Notes (Signed)
CARDIAC REHAB PHASE I   PRE:  Rate/Rhythm: 57 SB    BP: sitting 136/91    SaO2:   MODE:  Ambulation: 700 ft   POST:  Rate/Rhythm: 75 SR    BP: sitting 151/110, 138/100 30 min later    SaO2:   Pt dizzy upon starting to walk. Had to sit after 50 ft. Somewhat better and able to walk. Pt sts he thinks the height and windows [facility] were affecting him. Also he had not been out of bed yet and took Ambien last night. BP very elevated after walk. No meds yet.  Ed completed. Voiced understanding. Pt sts he is thankful for the information. Has been in the process of quitting smoking on patches. Seems motivated. Interestd in CRPII and will send referral to [place]. Gave financial aid application. [phone]  Elissa LovettReeve, Tatyanna Cronk Sulphur RockKristan CES, ACSM 07/24/2013 10:26 AM

## 2013-07-24 NOTE — Telephone Encounter (Signed)
TCM phone call .Marland Kitchen. Home # is [phone] and cell is [phone]  Thanks

## 2013-07-25 ENCOUNTER — Telehealth: Payer: Self-pay | Admitting: *Deleted

## 2013-07-25 ENCOUNTER — Telehealth: Payer: Self-pay | Admitting: Cardiology

## 2013-07-25 NOTE — Telephone Encounter (Signed)
Pt doing well, he will get his medications today. He was given his appointments with Dr Tresa EndoKelly and B. Simmons.  Corine ShelterLUKE Sabrin Dunlevy PA-C 07/25/2013 2:20 PM

## 2013-07-25 NOTE — Telephone Encounter (Signed)
Faxed cardiac rehab phase 2 orders

## 2013-08-09 ENCOUNTER — Ambulatory Visit: Payer: Self-pay | Admitting: Cardiology

## 2013-08-22 ENCOUNTER — Encounter: Payer: Self-pay | Admitting: *Deleted

## 2013-08-23 ENCOUNTER — Ambulatory Visit (INDEPENDENT_AMBULATORY_CARE_PROVIDER_SITE_OTHER): Payer: Self-pay | Admitting: Cardiovascular Disease

## 2013-08-23 VITALS — BP 152/90 | HR 74 | Ht 71.0 in | Wt 134.7 lb

## 2013-08-23 DIAGNOSIS — E785 Hyperlipidemia, unspecified: Secondary | ICD-10-CM

## 2013-08-23 DIAGNOSIS — I10 Essential (primary) hypertension: Secondary | ICD-10-CM

## 2013-08-23 DIAGNOSIS — I2510 Atherosclerotic heart disease of native coronary artery without angina pectoris: Secondary | ICD-10-CM

## 2013-08-23 DIAGNOSIS — I214 Non-ST elevation (NSTEMI) myocardial infarction: Secondary | ICD-10-CM

## 2013-08-23 DIAGNOSIS — R0789 Other chest pain: Secondary | ICD-10-CM

## 2013-08-23 DIAGNOSIS — F1910 Other psychoactive substance abuse, uncomplicated: Secondary | ICD-10-CM

## 2013-08-23 MED ORDER — METOPROLOL SUCCINATE ER 25 MG PO TB24: ORAL_TABLET | ORAL | Status: DC

## 2013-08-23 MED ORDER — CLOPIDOGREL BISULFATE 75 MG PO TABS: 75.0000 mg | ORAL_TABLET | Freq: Every day | ORAL | Status: DC

## 2013-08-23 MED ORDER — ATORVASTATIN CALCIUM 40 MG PO TABS: 40.0000 mg | ORAL_TABLET | Freq: Every day | ORAL | Status: DC

## 2013-08-23 MED ORDER — LISINOPRIL 5 MG PO TABS: 5.0000 mg | ORAL_TABLET | Freq: Every day | ORAL | Status: DC

## 2013-08-23 NOTE — Patient Instructions (Addendum)
Your physician has recommended you make the following change in your medication: restart all medications. These have already been sent to your pharmacy. Also start taking asiprin 81 mg daily  Your physician recommends that you schedule a follow-up appointment in: 6 weeks with extender and 4 months with Dr. Tresa EndoKelly.

## 2013-08-26 ENCOUNTER — Encounter: Payer: Self-pay | Admitting: Cardiovascular Disease

## 2013-08-26 NOTE — Progress Notes (Signed)
Patient ID: [name], male   DOB: 03/19/1975, 38 y.o.   MRN: [id number]     HPI: [name] is a 38 y.o. male who presents to the office today for a  follow up cardiology evaluation NSTEMI.  [name] is a 38 year old, African American male with a history of CAD, who suffered an inferior wall ST segment elevation myocardial infarction and underwent bare-metal stenting to his RCA in 2009.  He has history of hypertension, hyperlipidemia, bipolar disorder, tobacco use, and marijuana use.  In the medical records.  There is indication of possible crack cocaine use, but the patient vehemently denies this.  He was admitted to [facility] on 07/21/2013 with squeezing chest pressure.  He ruled in for a non-STEMI with a peak troponin of 16.  He had transient nonsustained VT on telemetry, related to his infarct.  He was premedicated for possible dye allergy, and underwent cardiac catheterization by Dr. SwazilandJordan, which revealed occlusion of the first diagonal vessel, and focal restenosis within the previously placed distal RCA stent.  This was treated with 3.5 x 10 mm cutting balloon intervention to the in-stent restenosis with plans for medical therapy for the diagonal occlusion.  He had mild 20% stenoses in the LAD, and in addition to occlusion of the first diagonal vessel, the second diagonal vessel, 70% stenosis at the ostium.  The circumflex vessel had 50% stenosis in the midportion.  The proximal RCA had disease of 30% and had 80-90% focal in-stent restenosis.  He was discharged on medical regimen consisting of aspirin, Plavix, lisinopril, and low-dose beta blocker.  However, the patient states that he left the prescriptions on the bus, and consequently never had the prescription filled after his discharge.  He presents for evaluation.  Past Medical History  Diagnosis Date  . MI, old 2009  . Bipolar 1 disorder   . Hypertension   . Hyperlipidemia   . Hx of medication noncompliance   .  Polysubstance abuse     a. h/o tobacco, marijuana and crack cocaine use. b. 07/2013: +UDS THC, neg for cocaine.  . Anxiety   . CAD (coronary artery disease)     a. inferior STEMI s/p BMS to RCA 01/2007. b. NSTEMI 07/2013 s/p PTCA to RCA (no stenting due to hx noncompliance).  . NSVT (nonsustained ventricular tachycardia)     a. Very brief run during 07/2013 admit for NSTEMI felt due to MI.  . Ischemic cardiomyopathy     a. EF 40% in 2011, 60% in 2012. b. EF 55% by cath 07/2013.    Past Surgical History  Procedure Laterality Date  . Femoral artery stent    . Coronary angioplasty with stent placement  02/05/2007    3.5x6218mm Quantum non-DES to RCA (Dr. Nicki Guadalajarahomas Kelly)  . Cardiac catheterization  01/16/2009    normal left main, Cfx with 2-OMs both w/minor irregularities, LAd with 20-30% mid region irregularities, ramus intermediate/optional diagonal with 60% osital narrowing, RCA with stent in distal portion w/20% prox in-stent stenosis (Dr. Mervyn SkeetersA. Little)  . Cardiac catheterization  07/23/2013    two vessel obstructive CAD, occluded first diagonal, focal in-stent restenosis in distal RCA (Dr. Peter SwazilandJordan)    Allergies  Allergen Reactions  . Iodine Shortness Of Breath and Swelling  . Shrimp [Shellfish Allergy] Anaphylaxis    Current Outpatient Prescriptions  Medication Sig Dispense Refill  . aspirin 81 MG tablet Take 81 mg by mouth daily.      Marland Kitchen. atorvastatin (LIPITOR) 40 MG tablet  Take 1 tablet (40 mg total) by mouth daily.  30 tablet  6  . clopidogrel (PLAVIX) 75 MG tablet Take 1 tablet (75 mg total) by mouth daily.  30 tablet  6  . lisinopril (PRINIVIL,ZESTRIL) 5 MG tablet Take 1 tablet (5 mg total) by mouth daily.  30 tablet  6  . metoprolol succinate (TOPROL XL) 25 MG 24 hr tablet Take 1/2 tablet daily  15 tablet  6   No current facility-administered medications for this visit.    History   Social History  . Marital Status: Single    Spouse Name: N/A    Number of Children: N/A  . Years  of Education: N/A   Occupational History  . Not on file.   Social History Main Topics  . Smoking status: Current Every Day Smoker -- 0.50 packs/day    Types: Cigarettes  . Smokeless tobacco: Not on file  . Alcohol Use: Yes  . Drug Use: Yes    Special: Marijuana     Comment: crack  . Sexual Activity: Not Currently   Other Topics Concern  . Not on file   Social History Narrative  . No narrative on file    Family History  Problem Relation Age of Onset  . CAD Mother   . CAD Brother   . CAD Sister     ROS General: Negative; No fevers, chills, or night sweats HEENT: Negative; No changes in vision or hearing, sinus congestion, difficulty swallowing Pulmonary: Negative; No cough, wheezing, shortness of breath, hemoptysis Cardiovascular: See HPI:  GI: Negative; No nausea, vomiting, diarrhea, or abdominal pain GU: Negative; No dysuria, hematuria, or difficulty voiding Musculoskeletal: Negative; no myalgias, joint pain, or weakness Hematologic: Negative; no easy bruising, bleeding Endocrine: Negative; no heat/cold intolerance; no diabetes, Neuro: Negative; no changes in balance, headaches Skin: Negative; No rashes or skin lesions Psychiatric: History of bipolar disorder.  Prior to his recent hospitalization.  He did have out of depression.  There is a history of substance abuse. Sleep: Negative; No snoring,  daytime sleepiness, hypersomnolence, bruxism, restless legs, hypnogognic hallucinations. Other comprehensive 14 point system review is negative   Physical Exam BP 152/90  Pulse 74  Ht 5\' 11"  (1.803 m)  Wt 134 lb 11.2 oz (61.1 kg)  BMI 18.80 kg/m2 General: Alert, oriented, no distress.  Skin: normal turgor, no rashes, warm and dry HEENT: Normocephalic, atraumatic. Pupils equal round and reactive to light; sclera anicteric; extraocular muscles intact, No lid lag; Nose without nasal septal hypertrophy; Mouth/Parynx benign; Mallinpatti scale 2 Neck: No JVD, no carotid  bruits; normal carotid upstroke Lungs: clear to ausculatation and percussion bilaterally; no wheezing or rales, normal inspiratory and expiratory effort Chest wall: without tenderness to palpitation Heart: PMI not displaced, RRR, s1 s2 normal, 1/6 systolic murmur, No diastolic murmur, no rubs, gallops, thrills, or heaves Abdomen: soft, nontender; no hepatosplenomehaly, BS+; abdominal aorta nontender and not dilated by palpation. Back: no CVA tenderness Pulses: 2+  Musculoskeletal: full range of motion, normal strength, no joint deformities Extremities: Pulses 2+, no clubbing cyanosis or edema, Homan's sign negative  Neurologic: grossly nonfocal; Cranial nerves grossly wnl Psychologic: Normal mood and affect   ECG (independently read by me): Normal sinus rhythm  LABS:  BMET    Component Value Date/Time   NA 138 07/24/2013 0358   K 3.8 07/24/2013 0358   CL 98 07/24/2013 0358   CO2 27 07/24/2013 0358   GLUCOSE 89 07/24/2013 0358   BUN 7 07/24/2013 0358  CREATININE 1.04 07/24/2013 0358   CALCIUM 9.0 07/24/2013 0358   GFRNONAA >90 07/24/2013 0358   GFRAA >90 07/24/2013 0358     Hepatic Function Panel     Component Value Date/Time   PROT 6.0 07/22/2013 0140   ALBUMIN 3.3* 07/22/2013 0140   AST 88* 07/22/2013 0140   ALT 32 07/22/2013 0140   ALKPHOS 62 07/22/2013 0140   BILITOT 0.5 07/22/2013 0140   BILIDIR <0.2 07/22/2013 0140   IBILI NOT CALCULATED 07/22/2013 0140     CBC    Component Value Date/Time   WBC 7.4 07/24/2013 0358   RBC 4.48 07/24/2013 0358   HGB 13.6 07/24/2013 0358   HCT 39.5 07/24/2013 0358   PLT 207 07/24/2013 0358   MCV 88.2 07/24/2013 0358   MCH 30.4 07/24/2013 0358   MCHC 34.4 07/24/2013 0358   RDW 14.5 07/24/2013 0358   LYMPHSABS 1.8 02/05/2013 1327   MONOABS 0.5 02/05/2013 1327   EOSABS 0.0 02/05/2013 1327   BASOSABS 0.0 02/05/2013 1327     BNP    Component Value Date/Time   PROBNP 51.6 07/21/2013 0530    Lipid Panel     Component Value Date/Time   CHOL 284*  07/22/2013 0138   TRIG 82 07/22/2013 0138   HDL 60 07/22/2013 0138   CHOLHDL 4.7 07/22/2013 0138   VLDL 16 07/22/2013 0138   LDLCALC 208* 07/22/2013 0138     RADIOLOGY: No results found.    ASSESSMENT AND PLAN: [name] is a 38 year old African-American gentleman who suffered an initial MI in January 2009 treated with bare-metal stenting to his RCA.  Ejection fraction at that time was possibly 40%.  He admits to marijuana use and tobacco prior to his current hospitalization, although there is mention of possible crack cocaine use, but he denies this.  He recently suffered a non-ST segment elevation MI and was found to have high-grade in-stent restenosis to his RCA, as well as an occluded diagonal vessel.  Ejection fraction was 55% by catheterization.  He apparently has not been taking any of his medications since discharge.  He didn't completely denies cocaine use.  For this reason, I am electing to start him back on low-dose metoprolol 12.5 mg twice a day, ACE inhibition with lisinopril 5 mg, aspirin 81 mg, in addition to clopidogrel.  He had significant hyperlipidemia noted during his hospitalization with an LDL of 208.  I will start him on atorvastatin 40 mg.  He will have initial followup extender in 6-8 weeks with repeat laboratory done prior to that visit.  I will see him in 4 months for cardiology reevaluation.     Lennette Bihari, MD, [facility]  08/26/2013 8:57 AM

## 2013-10-08 ENCOUNTER — Ambulatory Visit: Payer: Self-pay | Admitting: Cardiology

## 2013-11-19 ENCOUNTER — Encounter (HOSPITAL_COMMUNITY): Payer: Self-pay | Admitting: Emergency Medicine

## 2013-11-19 ENCOUNTER — Emergency Department (HOSPITAL_COMMUNITY)
Admission: EM | Admit: 2013-11-19 | Discharge: 2013-11-19 | Disposition: A | Discharge status: Home / Self Care | Payer: MEDICAID | Attending: Emergency Medicine | Admitting: Emergency Medicine

## 2013-11-19 DIAGNOSIS — Z9119 Patient's noncompliance with other medical treatment and regimen: Secondary | ICD-10-CM | POA: Insufficient documentation

## 2013-11-19 DIAGNOSIS — I10 Essential (primary) hypertension: Secondary | ICD-10-CM | POA: Insufficient documentation

## 2013-11-19 DIAGNOSIS — Z79899 Other long term (current) drug therapy: Secondary | ICD-10-CM | POA: Insufficient documentation

## 2013-11-19 DIAGNOSIS — Z9861 Coronary angioplasty status: Secondary | ICD-10-CM | POA: Insufficient documentation

## 2013-11-19 DIAGNOSIS — Z7982 Long term (current) use of aspirin: Secondary | ICD-10-CM | POA: Insufficient documentation

## 2013-11-19 DIAGNOSIS — I252 Old myocardial infarction: Secondary | ICD-10-CM | POA: Insufficient documentation

## 2013-11-19 DIAGNOSIS — E785 Hyperlipidemia, unspecified: Secondary | ICD-10-CM | POA: Insufficient documentation

## 2013-11-19 DIAGNOSIS — Z720 Tobacco use: Secondary | ICD-10-CM | POA: Insufficient documentation

## 2013-11-19 DIAGNOSIS — Z7902 Long term (current) use of antithrombotics/antiplatelets: Secondary | ICD-10-CM | POA: Insufficient documentation

## 2013-11-19 DIAGNOSIS — I2510 Atherosclerotic heart disease of native coronary artery without angina pectoris: Secondary | ICD-10-CM | POA: Insufficient documentation

## 2013-11-19 DIAGNOSIS — L0201 Cutaneous abscess of face: Secondary | ICD-10-CM | POA: Insufficient documentation

## 2013-11-19 DIAGNOSIS — Z9889 Other specified postprocedural states: Secondary | ICD-10-CM | POA: Insufficient documentation

## 2013-11-19 DIAGNOSIS — L0291 Cutaneous abscess, unspecified: Secondary | ICD-10-CM

## 2013-11-19 DIAGNOSIS — Z8659 Personal history of other mental and behavioral disorders: Secondary | ICD-10-CM | POA: Insufficient documentation

## 2013-11-19 MED ORDER — CEPHALEXIN 500 MG PO CAPS: 500.0000 mg | ORAL_CAPSULE | Freq: Four times a day (QID) | ORAL | Status: DC

## 2013-11-19 MED ORDER — LIDOCAINE-EPINEPHRINE (PF) 2 %-1:200000 IJ SOLN
10.0000 mL | Freq: Once | INTRAMUSCULAR | Status: AC
  Administered 2013-11-19: 10 mL
  Filled 2013-11-19: qty 20

## 2013-11-19 MED ORDER — ONDANSETRON 4 MG PO TBDP: 8.0000 mg | ORAL_TABLET | Freq: Once | ORAL | Status: DC

## 2013-11-19 NOTE — ED Notes (Signed)
Patient states he had an "ingrown hair pop up on my ride side of chin about 2 wks ago".  Patient states "it got bigger and I noticed it more when I shaved".  Patient denies drainage.

## 2013-11-19 NOTE — ED Provider Notes (Signed)
CSN: [id number]     Arrival date & time 11/19/13  [id number] History  This chart was scribed for non-physician practitioner, Roxy Horsemanobert Isack Lavalley, PA-C, working with Raeford RazorStephen Kohut, MD by Charline BillsEssence Howell, ED Scribe. This patient was seen in room TR08C/TR08C and the patient's care was started at 10:00 AM.   Chief Complaint  Patient presents with  . Abscess   The history is provided by the patient. No language interpreter was used.    HPI Comments: [name] is a 38 y.o. male, with a h/o HTN, CAD, hyperlipidemia, anxiety, who presents to the Emergency Department with a chief complaint of gradually worsening abscess to R chin first noted 2 weeks ago. Pt states that he initially suspected an ingrown hair. He reports associated pain to the area which is exacerbated with touching. Pt noted "oozing" from the abscess yesterday.   Past Medical History  Diagnosis Date  . MI, old 2009  . Bipolar 1 disorder   . Hypertension   . Hyperlipidemia   . Hx of medication noncompliance   . Polysubstance abuse     a. h/o tobacco, marijuana and crack cocaine use. b. 07/2013: +UDS THC, neg for cocaine.  . Anxiety   . CAD (coronary artery disease)     a. inferior STEMI s/p BMS to RCA 01/2007. b. NSTEMI 07/2013 s/p PTCA to RCA (no stenting due to hx noncompliance).  . NSVT (nonsustained ventricular tachycardia)     a. Very brief run during 07/2013 admit for NSTEMI felt due to MI.  . Ischemic cardiomyopathy     a. EF 40% in 2011, 60% in 2012. b. EF 55% by cath 07/2013.   Past Surgical History  Procedure Laterality Date  . Femoral artery stent    . Coronary angioplasty with stent placement  02/05/2007    3.5x518mm Quantum non-DES to RCA (Dr. Nicki Guadalajarahomas Kelly)  . Cardiac catheterization  01/16/2009    normal left main, Cfx with 2-OMs both w/minor irregularities, LAd with 20-30% mid region irregularities, ramus intermediate/optional diagonal with 60% osital narrowing, RCA with stent in distal portion w/20% prox in-stent stenosis  (Dr. Mervyn SkeetersA. Little)  . Cardiac catheterization  07/23/2013    two vessel obstructive CAD, occluded first diagonal, focal in-stent restenosis in distal RCA (Dr. Peter SwazilandJordan)   Family History  Problem Relation Age of Onset  . CAD Mother   . CAD Brother   . CAD Sister    History  Substance Use Topics  . Smoking status: Current Every Day Smoker -- 0.50 packs/day    Types: Cigarettes  . Smokeless tobacco: Not on file  . Alcohol Use: Yes    Review of Systems  Constitutional: Negative for fever and chills.  Respiratory: Negative for shortness of breath.   Cardiovascular: Negative for chest pain.  Gastrointestinal: Negative for nausea, vomiting, diarrhea and constipation.  Genitourinary: Negative for dysuria.  Skin: Positive for wound.       + Abscess    Allergies  Iodine and Shrimp  Home Medications   Prior to Admission medications   Medication Sig Start Date End Date Taking? Authorizing Provider  aspirin 81 MG tablet Take 81 mg by mouth daily.    Historical Provider, MD  atorvastatin (LIPITOR) 40 MG tablet Take 1 tablet (40 mg total) by mouth daily. 08/23/13   Lennette Biharihomas A Kelly, MD  clopidogrel (PLAVIX) 75 MG tablet Take 1 tablet (75 mg total) by mouth daily. 08/23/13   Lennette Biharihomas A Kelly, MD  lisinopril (PRINIVIL,ZESTRIL) 5 MG tablet Take 1  tablet (5 mg total) by mouth daily. 08/23/13   Lennette Biharihomas A Kelly, MD  metoprolol succinate (TOPROL XL) 25 MG 24 hr tablet Take 1/2 tablet daily 08/23/13   Lennette Biharihomas A Kelly, MD   Triage Vitals: BP 147/92 mmHg  Pulse 50  Temp(Src) 97.6 F (36.4 C) (Oral)  Resp 18  SpO2 100% Physical Exam  Constitutional: He is oriented to person, place, and time. He appears well-developed and well-nourished. No distress.  HENT:  Head: Normocephalic and atraumatic.  Eyes: Conjunctivae and EOM are normal.  Neck: Neck supple.  Pulmonary/Chest: Effort normal. No respiratory distress.  Musculoskeletal: Normal range of motion.  Neurological: He is alert and oriented to  person, place, and time.  Skin: Skin is warm and dry.  1x1 cm abscess to the R inferior jaw line. No surrounding erythema. Mild purulent drainage.  Psychiatric: He has a normal mood and affect. His behavior is normal.  Nursing note and vitals reviewed.  ED Course  Procedures (including critical care time) DIAGNOSTIC STUDIES: Oxygen Saturation is 100% on RA, normal by my interpretation.    INCISION AND DRAINAGE PROCEDURE NOTE: Patient identification was confirmed and verbal consent was obtained. This procedure was performed by Roxy Horsemanobert Margi Edmundson, PA-C at 10:31 AM. Site: R jaw line Sterile procedures observed yes Needle size: 27  Anesthetic used (type and amt): 2% lidocaine with 1 mL Blade size: 18 gage needle Drainage: copious  Complexity: Complex Packing not used  Site anesthetized, incision made over site, wound drained and explored loculations, rinsed with copious amounts of normal saline, wound packed with sterile gauze, covered with dry, sterile dressing.  Pt tolerated procedure well without complications.  Instructions for care discussed verbally and pt provided with additional written instructions for homecare and f/u.  COORDINATION OF CARE: 10:04 AM-Discussed treatment plan which includes I&D, warm compresses and antibiotics with pt at bedside and pt agreed to plan.   Labs Review Labs Reviewed - No data to display  Imaging Review No results found.   EKG Interpretation None      MDM   Final diagnoses:  Abscess    Patient with skin abscess amenable to incision and drainage.  Abscess was not large enough to warrant packing or drain,  wound recheck in 2 days. Encouraged home warm soaks and flushing.  Mild signs of cellulitis is surrounding skin.  Will d/c to home.     I personally performed the services described in this documentation, which was scribed in my presence. The recorded information has been reviewed and is accurate.     Roxy HorsemanRobert Tiphany Fayson,  PA-C 11/19/13 1106  Raeford RazorStephen Kohut, MD 11/19/13 1140

## 2013-11-19 NOTE — Discharge Instructions (Signed)

## 2013-12-20 ENCOUNTER — Encounter (HOSPITAL_COMMUNITY): Payer: Self-pay | Admitting: Cardiology

## 2013-12-24 ENCOUNTER — Ambulatory Visit: Payer: Self-pay | Admitting: Cardiovascular Disease

## 2014-01-14 ENCOUNTER — Inpatient Hospital Stay (HOSPITAL_COMMUNITY)
Admission: EM | Admit: 2014-01-14 | Discharge: 2014-01-15 | DRG: 247 | Disposition: A | Discharge status: Home / Self Care | Payer: MEDICAID | Source: Ambulatory Visit | Attending: Cardiovascular Disease | Admitting: Cardiovascular Disease

## 2014-01-14 ENCOUNTER — Encounter (HOSPITAL_COMMUNITY)
Admission: EM | Disposition: A | Discharge status: Home / Self Care | Payer: MEDICAID | Source: Ambulatory Visit | Attending: Cardiovascular Disease

## 2014-01-14 ENCOUNTER — Encounter (HOSPITAL_COMMUNITY): Payer: Self-pay | Admitting: Cardiology

## 2014-01-14 DIAGNOSIS — F319 Bipolar disorder, unspecified: Secondary | ICD-10-CM | POA: Diagnosis present

## 2014-01-14 DIAGNOSIS — I2510 Atherosclerotic heart disease of native coronary artery without angina pectoris: Secondary | ICD-10-CM | POA: Diagnosis present

## 2014-01-14 DIAGNOSIS — Z9114 Patient's other noncompliance with medication regimen: Secondary | ICD-10-CM | POA: Diagnosis present

## 2014-01-14 DIAGNOSIS — R001 Bradycardia, unspecified: Secondary | ICD-10-CM | POA: Diagnosis present

## 2014-01-14 DIAGNOSIS — Z955 Presence of coronary angioplasty implant and graft: Secondary | ICD-10-CM

## 2014-01-14 DIAGNOSIS — F419 Anxiety disorder, unspecified: Secondary | ICD-10-CM | POA: Diagnosis present

## 2014-01-14 DIAGNOSIS — I252 Old myocardial infarction: Secondary | ICD-10-CM

## 2014-01-14 DIAGNOSIS — Z8249 Family history of ischemic heart disease and other diseases of the circulatory system: Secondary | ICD-10-CM

## 2014-01-14 DIAGNOSIS — I214 Non-ST elevation (NSTEMI) myocardial infarction: Principal | ICD-10-CM | POA: Diagnosis present

## 2014-01-14 DIAGNOSIS — Z720 Tobacco use: Secondary | ICD-10-CM | POA: Diagnosis present

## 2014-01-14 DIAGNOSIS — T82867A Thrombosis of cardiac prosthetic devices, implants and grafts, initial encounter: Secondary | ICD-10-CM | POA: Diagnosis present

## 2014-01-14 DIAGNOSIS — I255 Ischemic cardiomyopathy: Secondary | ICD-10-CM | POA: Diagnosis present

## 2014-01-14 DIAGNOSIS — Z9861 Coronary angioplasty status: Secondary | ICD-10-CM

## 2014-01-14 DIAGNOSIS — Z7982 Long term (current) use of aspirin: Secondary | ICD-10-CM

## 2014-01-14 DIAGNOSIS — E785 Hyperlipidemia, unspecified: Secondary | ICD-10-CM | POA: Diagnosis present

## 2014-01-14 DIAGNOSIS — F17210 Nicotine dependence, cigarettes, uncomplicated: Secondary | ICD-10-CM | POA: Diagnosis present

## 2014-01-14 DIAGNOSIS — I10 Essential (primary) hypertension: Secondary | ICD-10-CM | POA: Diagnosis present

## 2014-01-14 DIAGNOSIS — I2111 ST elevation (STEMI) myocardial infarction involving right coronary artery: Secondary | ICD-10-CM | POA: Diagnosis present

## 2014-01-14 HISTORY — DX: Atherosclerotic heart disease of native coronary artery without angina pectoris: I25.10

## 2014-01-14 HISTORY — DX: Atherosclerotic heart disease of native coronary artery without angina pectoris: Z98.61

## 2014-01-14 HISTORY — DX: Tobacco use: Z72.0

## 2014-01-14 HISTORY — PX: LEFT AND RIGHT HEART CATHETERIZATION WITH CORONARY ANGIOGRAM: SHX5449

## 2014-01-14 LAB — POCT ACTIVATED CLOTTING TIME: ACTIVATED CLOTTING TIME: 263 s

## 2014-01-14 LAB — TSH: TSH: 0.807 u[IU]/mL (ref 0.350–4.500)

## 2014-01-14 LAB — PROTIME-INR
INR: 1.07 (ref 0.00–1.49)
Prothrombin Time: 14 seconds (ref 11.6–15.2)

## 2014-01-14 LAB — BRAIN NATRIURETIC PEPTIDE: B Natriuretic Peptide: 29 pg/mL (ref 0.0–100.0)

## 2014-01-14 LAB — TROPONIN I: TROPONIN I: 0.09 ng/mL — AB (ref <0.031)

## 2014-01-14 LAB — MRSA PCR SCREENING: MRSA by PCR: NEGATIVE

## 2014-01-14 SURGERY — LEFT AND RIGHT HEART CATHETERIZATION WITH CORONARY ANGIOGRAM
Anesthesia: LOCAL

## 2014-01-14 MED ORDER — ACETAMINOPHEN 325 MG PO TABS
650.0000 mg | ORAL_TABLET | ORAL | Status: DC | PRN
  Administered 2014-01-14 – 2014-01-15 (×2): 650 mg via ORAL
  Filled 2014-01-14 (×2): qty 2

## 2014-01-14 MED ORDER — VERAPAMIL HCL 2.5 MG/ML IV SOLN
INTRAVENOUS | Status: AC
  Filled 2014-01-14: qty 2

## 2014-01-14 MED ORDER — ATORVASTATIN CALCIUM 40 MG PO TABS
40.0000 mg | ORAL_TABLET | Freq: Every day | ORAL | Status: DC
  Filled 2014-01-14: qty 1

## 2014-01-14 MED ORDER — ASPIRIN 300 MG RE SUPP: 300.0000 mg | RECTAL | Status: AC

## 2014-01-14 MED ORDER — NITROGLYCERIN 1 MG/10 ML FOR IR/CATH LAB
INTRA_ARTERIAL | Status: AC
  Filled 2014-01-14: qty 10

## 2014-01-14 MED ORDER — NICOTINE 21 MG/24HR TD PT24
21.0000 mg | MEDICATED_PATCH | Freq: Every day | TRANSDERMAL | Status: DC
  Administered 2014-01-14 – 2014-01-15 (×2): 21 mg via TRANSDERMAL
  Filled 2014-01-14 (×2): qty 1

## 2014-01-14 MED ORDER — SODIUM CHLORIDE 0.9 % IV SOLN: INTRAVENOUS | Status: AC

## 2014-01-14 MED ORDER — MIDAZOLAM HCL 2 MG/2ML IJ SOLN
INTRAMUSCULAR | Status: AC
  Filled 2014-01-14: qty 2

## 2014-01-14 MED ORDER — LIDOCAINE HCL (PF) 1 % IJ SOLN
INTRAMUSCULAR | Status: AC
  Filled 2014-01-14: qty 30

## 2014-01-14 MED ORDER — HEPARIN (PORCINE) IN NACL 2-0.9 UNIT/ML-% IJ SOLN
INTRAMUSCULAR | Status: AC
  Filled 2014-01-14: qty 1500

## 2014-01-14 MED ORDER — ASPIRIN EC 81 MG PO TBEC
81.0000 mg | DELAYED_RELEASE_TABLET | Freq: Every day | ORAL | Status: DC
  Administered 2014-01-15: 81 mg via ORAL
  Filled 2014-01-14: qty 1

## 2014-01-14 MED ORDER — TICAGRELOR 90 MG PO TABS
ORAL_TABLET | ORAL | Status: AC
  Filled 2014-01-14: qty 1

## 2014-01-14 MED ORDER — OXYCODONE-ACETAMINOPHEN 5-325 MG PO TABS: 1.0000 | ORAL_TABLET | ORAL | Status: DC | PRN

## 2014-01-14 MED ORDER — ONDANSETRON HCL 4 MG/2ML IJ SOLN: 4.0000 mg | Freq: Four times a day (QID) | INTRAMUSCULAR | Status: DC | PRN

## 2014-01-14 MED ORDER — SODIUM CHLORIDE 0.9 % IJ SOLN: 3.0000 mL | Freq: Two times a day (BID) | INTRAMUSCULAR | Status: DC

## 2014-01-14 MED ORDER — INFLUENZA VAC SPLIT QUAD 0.5 ML IM SUSY
0.5000 mL | PREFILLED_SYRINGE | INTRAMUSCULAR | Status: AC
  Administered 2014-01-15: 0.5 mL via INTRAMUSCULAR
  Filled 2014-01-14: qty 0.5

## 2014-01-14 MED ORDER — NITROGLYCERIN 0.4 MG SL SUBL: 0.4000 mg | SUBLINGUAL_TABLET | SUBLINGUAL | Status: DC | PRN

## 2014-01-14 MED ORDER — TICAGRELOR 90 MG PO TABS
90.0000 mg | ORAL_TABLET | Freq: Two times a day (BID) | ORAL | Status: DC
  Administered 2014-01-14 – 2014-01-15 (×2): 90 mg via ORAL
  Filled 2014-01-14 (×3): qty 1

## 2014-01-14 MED ORDER — ACETAMINOPHEN 325 MG PO TABS: 650.0000 mg | ORAL_TABLET | ORAL | Status: DC | PRN

## 2014-01-14 MED ORDER — SODIUM CHLORIDE 0.9 % IV SOLN: 250.0000 mL | INTRAVENOUS | Status: DC | PRN

## 2014-01-14 MED ORDER — SODIUM CHLORIDE 0.9 % IV SOLN
0.2500 mg/kg/h | INTRAVENOUS | Status: AC
  Filled 2014-01-14: qty 250

## 2014-01-14 MED ORDER — SODIUM CHLORIDE 0.9 % IJ SOLN: 3.0000 mL | INTRAMUSCULAR | Status: DC | PRN

## 2014-01-14 MED ORDER — MORPHINE SULFATE 2 MG/ML IJ SOLN: 2.0000 mg | INTRAMUSCULAR | Status: DC | PRN

## 2014-01-14 MED ORDER — FENTANYL CITRATE 0.05 MG/ML IJ SOLN
INTRAMUSCULAR | Status: AC
  Filled 2014-01-14: qty 2

## 2014-01-14 MED ORDER — ASPIRIN 81 MG PO CHEW: 324.0000 mg | CHEWABLE_TABLET | ORAL | Status: AC

## 2014-01-14 MED ORDER — BIVALIRUDIN 250 MG IV SOLR
INTRAVENOUS | Status: AC
  Filled 2014-01-14: qty 250

## 2014-01-14 MED ORDER — ASPIRIN 81 MG PO TABS: 81.0000 mg | ORAL_TABLET | Freq: Every day | ORAL | Status: DC

## 2014-01-14 NOTE — Progress Notes (Signed)
angiomax was D/C'd at 1530 by Army Melia RN. TR band with 9cc will begin deflation 2 h post angiomax D/C at 1730.

## 2014-01-14 NOTE — CV Procedure (Addendum)
Cardiac Catheterization Operative Report  [name] [id number] 1/4/20161:20 PM No PCP Per Patient  Procedure Performed:  1. Left Heart Catheterization 2. Selective Coronary Angiography 3. Left ventricular angiogram 4. PTCA/DES x 1 proximal RCA 5. PTCA/DES x 1 distal RCA  Operator: Verne Carrow, MD  Arterial access site:  Right radial artery.   Indication:  39 yo male with history of CAD with prior placement of a bare metal stent distal RCA in 2009 with restenosis of the distal RCA stent treated with cutting balloon by Dr. Swaziland in July 2015. He continues to smoke. Medication non-compliance due to cost but pt willing to take medications. Code STEMI activated by EMS. Inferior ST elevation on EKG.                                     Procedure Details: The risks, benefits, complications, treatment options, and expected outcomes were discussed with the patient. Emergency consent was obtained. The patient was further sedated with Versed and Fentanyl. The right wrist was assessed with a modified Allens test which was positive. The right wrist was prepped and draped in a sterile fashion. 1% lidocaine was used for local anesthesia. Using the modified Seldinger access technique, a 6 French sheath was placed in the right radial artery. 3 mg Verapamil was given through the sheath. 4000 units IV heparin was given. Standard diagnostic catheters were used to perform selective coronary angiography. He was found to have sub-total occlusion of the proximal RCA. I elected to proceed to PCI of the RCA.   PCI Note: He was given a weight based bolus of Angiomax and a drip was started. He was given Brilinta 180 mg po x 1. When the ACT was over 200, I engaged the RCA with a JR4 guiding catheter.   Lesion #1: (proximal RCA): A cougar IC wire was advanced down the RCA. A 2.5 x 15 mm balloon was used to pre-dilate the proximal RCA x 2. A 3.0 x 22 mm Resolute DES was placed in the proximal RCA.  The stent was post-dilated with a 3.25 x 15 mm [place] balloon x 2. The stenosis was taken from 99% down to 0%.   Lesion #2: (distal RCA): A 2.5 x 15 mm balloon was used to pre-dilate the distal area of stenosis just before the old stent and just inside the proximal edge of the stent. A 3.0 x 22 mm Resolute DES was deployed in the distal RCA covering the old stented segment and covering the lesion just proximal to the old stent. The stent was post-dilated x 2 with a 3.25 x 15 mm [place] balloon. The stenosis was taken from 80% down to 0%.   Of note, DES used given the need for multiple stents and prior restenosis in his bare metal stent. He is adamant that he will take his medications if he is given financial assistance.   A pigtail catheter was used to perform a left ventricular angiogram. The sheath was removed from the right radial artery and a Terumo hemostasis band was applied at the arteriotomy site on the right wrist.    There were no immediate complications. The patient was taken to the recovery area in stable condition.   Hemodynamic Findings: Central aortic pressure: 136/81 Left ventricular pressure: 115/4/18  Left circumflex (LCx): The LCx is a large vessel giving rise to 2 OM branches. There is diffuse ectasia.  There is a 50% stenosis in the mid LCx.  Angiographic Findings:  Left main: No obstructive disease.   Left Anterior Descending Artery:Large caliber vessel that courses to the apex with diffuse 20-30% stenosis in the proximal, mid and distal vessel. The first diagonal is occluded proximally. The second diagonal is a very small caliber vessel with ostial 70% stenosis.   Circumflex Artery: Large caliber vessel with diffuse ectasia giving rise to 2 obtuse marginal branches. The first OM branch has a 30% stenosis. The mid Circumflex has a 50% stenosis.   Right Coronary Artery: Large caliber dominant vessel with 99% sub-total occlusion proximally with appearance of thrombus. The mid  vessel has a 40% stenosis. The distal vessel has a stented segment with 80% proximal edge restenosis as well as a 80% stenosis just proximal to the stented segment.   Left Ventricular Angiogram: 50%, no MR  Impression: 1. Severe single vessel CAD 2. Severe stenosis proximal RCA and distal RCA 3. Preserved LV systolic function 4. NSTEMI (initial ST elevation while in EMS but chest pain resolved before arrival to ED and EKG normalized) 5. Successful PTCA/DES x 1 proximal RCA 6. Successful PTCA/DES x 1 distal RCA  Recommendations: He will need Brilinta 90 mg po BID for at least one year. Will get him into the assistance program. Continue ASA and statin. Start beta blocker if HR allows. Angiomax for 2 hours at reduced rate then d/c.        Complications:  None. The patient tolerated the procedure well.

## 2014-01-14 NOTE — H&P (Signed)
Patient ID: [name] MRN: [id number], DOB/AGE: 02-12-1975   Admit date: 01/14/2014   Primary Physician: No PCP Per Patient Primary Cardiologist: Dr. Tresa Endo  Pt. Profile:  [name] is a 39 y.o. male with a history of HTN, HLD, bipolar disorder, tobacco abuse, polysubstance abuse, CAD s/p STEMI with BMS to RCA (2009); NSTEMI s/p cutting balloon for ISR (07/2013), ischemic cardiomyopathy (EF 40%--> 55% by last LHC) and medical non compliance who presented to [facility] via EMS with chest pain. Initially this was called as a CODE STEMI but ST segments eventually resolved. It was decided to proceed with urgent cardiac catheterization given his chest pain and significant cardiac history.   He has a history of CAD. He was admitted to [facility] on 07/21/2013 with squeezing chest pressure. He ruled in for a NSTEMI with a peak troponin of 16. He had transient nonsustained VT on telemetry, related to his infarct. He was premedicated for possible dye allergy, and underwent cardiac catheterization by Dr. Swaziland, which revealed occlusion of the first diagonal vessel, and focal restenosis within the previously placed distal RCA stent.This was treated with 3.5 x 10 mm cutting balloon intervention to the in-stent restenosis with plans for medical therapy for the diagonal occlusion. He had mild 20% stenoses in the LAD, and in addition to occlusion of the first diagonal vessel, the second diagonal vessel, 70% stenosis at the ostium. The circumflex vessel had 50% stenosis in the midportion.The proximal RCA had disease of 30% and had 80-90% focal in-stent restenosis. EF 55% at this time. He was discharged on medical regimen consisting of aspirin, Plavix, lisinopril, and low-dose beta blocker. No stenting at this time due to medical non compliance. In the medical records there is indication of possible crack cocaine use, but the patient vehemently denies this.    He was in his usual state of health  until this AM when he was walking to the store to get cigarettes. It was very cold and he developed sudden onset of severe chest pain. This was associated with SOB, diaphoresis and nausea. EMS was called who found him looking very distressed. Initial ECG revealed STE, which eventually resolved. Chest pain resolved completley after 2 SL NTG given by EMS and he is currently chest pain free. He continues to smoke cigarettes as well as mariajuana. He denies other illicit drug use. He has not taken any of his prescribed medications since July, aside from ASA, due to financial hardship.   Problem List  Past Medical History  Diagnosis Date  . MI, old 2009  . Bipolar 1 disorder   . Hypertension   . Hyperlipidemia   . Hx of medication noncompliance   . Polysubstance abuse     a. h/o tobacco, marijuana and crack cocaine use. b. 07/2013: +UDS THC, neg for cocaine.  . Anxiety   . CAD (coronary artery disease)     a. inferior STEMI s/p BMS to RCA 01/2007. b. NSTEMI 07/2013 s/p PTCA to RCA (no stenting due to hx noncompliance).  . NSVT (nonsustained ventricular tachycardia)     a. Very brief run during 07/2013 admit for NSTEMI felt due to MI.  . Ischemic cardiomyopathy     a. EF 40% in 2011, 60% in 2012. b. EF 55% by cath 07/2013.    Past Surgical History  Procedure Laterality Date  . Femoral artery stent    . Coronary angioplasty with stent placement  02/05/2007    3.5x34mm Quantum non-DES to RCA (  Dr. Nicki Guadalajara)  . Cardiac catheterization  01/16/2009    normal left main, Cfx with 2-OMs both w/minor irregularities, LAd with 20-30% mid region irregularities, ramus intermediate/optional diagonal with 60% osital narrowing, RCA with stent in distal portion w/20% prox in-stent stenosis (Dr. Mervyn Skeeters. [place])  . Cardiac catheterization  07/23/2013    two vessel obstructive CAD, occluded first diagonal, focal in-stent restenosis in distal RCA (Dr. Peter Swaziland)  . Left heart catheterization with coronary angiogram N/A  07/23/2013    Procedure: LEFT HEART CATHETERIZATION WITH CORONARY ANGIOGRAM;  Surgeon: Peter M Swaziland, MD;  Location: [facility] CATH LAB;  Service: Cardiovascular;  Laterality: N/A;     Allergies  Allergies  Allergen Reactions  . Iodine Shortness Of Breath and Swelling  . Shrimp [Shellfish Allergy] Anaphylaxis     Home Medications  Prior to Admission medications   Medication Sig Start Date End Date Taking? Authorizing Provider  aspirin 81 MG tablet Take 81 mg by mouth daily.    Historical Provider, MD  atorvastatin (LIPITOR) 40 MG tablet Take 1 tablet (40 mg total) by mouth daily. 08/23/13   Lennette Bihari, MD  cephALEXin (KEFLEX) 500 MG capsule Take 1 capsule (500 mg total) by mouth 4 (four) times daily. 11/19/13   Roxy Horseman, PA-C  clopidogrel (PLAVIX) 75 MG tablet Take 1 tablet (75 mg total) by mouth daily. 08/23/13   Lennette Bihari, MD  lisinopril (PRINIVIL,ZESTRIL) 5 MG tablet Take 1 tablet (5 mg total) by mouth daily. 08/23/13   Lennette Bihari, MD  metoprolol succinate (TOPROL XL) 25 MG 24 hr tablet Take 1/2 tablet daily 08/23/13   Lennette Bihari, MD    Family History  Family History  Problem Relation Age of Onset  . CAD Mother   . CAD Brother   . CAD Sister    No family status information on file.     Social History  History   Social History  . Marital Status: Single    Spouse Name: N/A    Number of Children: N/A  . Years of Education: N/A   Occupational History  . Not on file.   Social History Main Topics  . Smoking status: Current Every Day Smoker -- 0.50 packs/day    Types: Cigarettes  . Smokeless tobacco: Not on file  . Alcohol Use: Yes  . Drug Use: Yes    Special: Marijuana     Comment: crack  . Sexual Activity: Not Currently   Other Topics Concern  . Not on file   Social History Narrative     All other systems reviewed and are otherwise negative except as noted above.  Physical Exam  There were no vitals taken for this visit.  General:  Pleasant, NAD Psych: Normal affect. Neuro: Alert and oriented X 3. Moves all extremities spontaneously. HEENT: Normal  Neck: Supple without bruits or JVD. Lungs:  Resp regular and unlabored, CTA. Heart: RRR no s3, s4, or murmurs. Abdomen: Soft, non-tender, non-distended, BS + x 4.  Extremities: No clubbing, cyanosis or edema. DP/PT/Radials 2+ and equal bilaterally.  Labs     Radiology/Studies  No results found.  ECG  Inferior ST elevation on first ECG than subsequently resolved.   ASSESSMENT AND PLAN  [name] is a 39 y.o. male with a history of HTN, HLD, bipolar disorder, tobacco abuse, polysubstance abuse, CAD s/p STEMI with BMS to RCA (2009); NSTEMI s/p cutting balloon for ISR (07/2013), ischemic cardiomyopathy (EF 40%--> 55% by last LHC) and medical  non compliance who presented to [facility] via EMS with chest pain. Initially this was called as a CODE STEMI but ST segments eventually resolved. It was decided to proceed with urgent cardiac catheterization given his chest pain and significant cardiac history.   PLAN- admit with basic labs, UDS. Cycle CEs. Proceed with LHC due to acute presentation. Will need to resume home meds and have social work see to make sure he can afford his medications. Okay so start BB after UDS if negative for cocaine. He vehemently denies using any cocaine.     Billy Fischer, PA-C 01/14/2014, 12:14 PM  Pager [phone]  I have personally seen and examined this patient with Cline Crock, PA-C. I agree with the assessment and plan as outlined above. Emergent cardiac cath planned. Pt presenting with ST elevation MI with resolution of chest pain and EKG changes upon presentation to ED. Given high risk situation, emergent cardiac cath is indicated.   Jaquin Coy 2:12 PM 01/14/2014

## 2014-01-15 ENCOUNTER — Encounter (HOSPITAL_COMMUNITY): Payer: Self-pay | Admitting: Cardiovascular Disease

## 2014-01-15 ENCOUNTER — Other Ambulatory Visit: Payer: Self-pay | Admitting: Physician Assistant

## 2014-01-15 ENCOUNTER — Telehealth: Payer: Self-pay | Admitting: Cardiovascular Disease

## 2014-01-15 DIAGNOSIS — I2111 ST elevation (STEMI) myocardial infarction involving right coronary artery: Secondary | ICD-10-CM | POA: Diagnosis present

## 2014-01-15 DIAGNOSIS — E785 Hyperlipidemia, unspecified: Secondary | ICD-10-CM

## 2014-01-15 DIAGNOSIS — I10 Essential (primary) hypertension: Secondary | ICD-10-CM

## 2014-01-15 DIAGNOSIS — F419 Anxiety disorder, unspecified: Secondary | ICD-10-CM | POA: Insufficient documentation

## 2014-01-15 DIAGNOSIS — I472 Ventricular tachycardia: Secondary | ICD-10-CM | POA: Insufficient documentation

## 2014-01-15 DIAGNOSIS — I4729 Other ventricular tachycardia: Secondary | ICD-10-CM | POA: Insufficient documentation

## 2014-01-15 DIAGNOSIS — Z9119 Patient's noncompliance with other medical treatment and regimen: Secondary | ICD-10-CM

## 2014-01-15 DIAGNOSIS — Z720 Tobacco use: Secondary | ICD-10-CM | POA: Diagnosis present

## 2014-01-15 DIAGNOSIS — Z9114 Patient's other noncompliance with medication regimen: Secondary | ICD-10-CM

## 2014-01-15 DIAGNOSIS — Z9861 Coronary angioplasty status: Secondary | ICD-10-CM

## 2014-01-15 DIAGNOSIS — Z955 Presence of coronary angioplasty implant and graft: Secondary | ICD-10-CM

## 2014-01-15 DIAGNOSIS — I255 Ischemic cardiomyopathy: Secondary | ICD-10-CM | POA: Diagnosis present

## 2014-01-15 LAB — COMPREHENSIVE METABOLIC PANEL
ALK PHOS: 79 U/L (ref 39–117)
ALT: 27 U/L (ref 0–53)
ANION GAP: 8 (ref 5–15)
AST: 21 U/L (ref 0–37)
Albumin: 3.6 g/dL (ref 3.5–5.2)
BUN: 9 mg/dL (ref 6–23)
CO2: 28 mmol/L (ref 19–32)
Calcium: 9.3 mg/dL (ref 8.4–10.5)
Chloride: 101 mEq/L (ref 96–112)
Creatinine, Ser: 1.19 mg/dL (ref 0.50–1.35)
GFR, EST AFRICAN AMERICAN: 89 mL/min — AB (ref >90)
GFR, EST NON AFRICAN AMERICAN: 77 mL/min — AB (ref >90)
GLUCOSE: 110 mg/dL — AB (ref 70–99)
Potassium: 4.8 mmol/L (ref 3.5–5.1)
SODIUM: 137 mmol/L (ref 135–145)
TOTAL PROTEIN: 6.6 g/dL (ref 6.0–8.3)
Total Bilirubin: 0.7 mg/dL (ref 0.3–1.2)

## 2014-01-15 LAB — URINE DRUGS OF ABUSE SCREEN W ALC, ROUTINE (REF LAB)
AMPHETAMINE SCRN UR: NEGATIVE
BARBITURATE QUANT UR: NEGATIVE
BENZODIAZEPINES.: POSITIVE — AB
Cocaine Metabolites: NEGATIVE
Creatinine,U: 192.7 mg/dL
Ethyl Alcohol: <10 mg/dL (ref <10)
MARIJUANA METABOLITE: POSITIVE — AB
Methadone: NEGATIVE
OPIATE SCREEN, URINE: NEGATIVE
Phencyclidine (PCP): NEGATIVE
Propoxyphene: NEGATIVE

## 2014-01-15 LAB — CBC
HEMATOCRIT: 45.9 % (ref 39.0–52.0)
Hemoglobin: 15.9 g/dL (ref 13.0–17.0)
MCH: 30.8 pg (ref 26.0–34.0)
MCHC: 34.6 g/dL (ref 30.0–36.0)
MCV: 88.8 fL (ref 78.0–100.0)
Platelets: 281 10*3/uL (ref 150–400)
RBC: 5.17 MIL/uL (ref 4.22–5.81)
RDW: 15.6 % — AB (ref 11.5–15.5)
WBC: 7.1 10*3/uL (ref 4.0–10.5)

## 2014-01-15 LAB — TROPONIN I
TROPONIN I: 0.25 ng/mL — AB (ref <0.031)
Troponin I: 0.13 ng/mL — ABNORMAL HIGH (ref <0.031)

## 2014-01-15 LAB — PROTIME-INR
INR: 1.04 (ref 0.00–1.49)
Prothrombin Time: 13.7 seconds (ref 11.6–15.2)

## 2014-01-15 LAB — HEMOGLOBIN A1C
Hgb A1c MFr Bld: 5.8 % — ABNORMAL HIGH (ref <5.7)
Mean Plasma Glucose: 120 mg/dL — ABNORMAL HIGH (ref <117)

## 2014-01-15 LAB — LIPID PANEL
Cholesterol: 352 mg/dL — ABNORMAL HIGH (ref 0–200)
HDL: 72 mg/dL (ref >39)
LDL CALC: 272 mg/dL — AB (ref 0–99)
TRIGLYCERIDES: 42 mg/dL (ref <150)
Total CHOL/HDL Ratio: 4.9 RATIO
VLDL: 8 mg/dL (ref 0–40)

## 2014-01-15 MED ORDER — LISINOPRIL 5 MG PO TABS: 5.0000 mg | ORAL_TABLET | Freq: Every day | ORAL | Status: DC

## 2014-01-15 MED ORDER — TICAGRELOR 90 MG PO TABS: 90.0000 mg | ORAL_TABLET | Freq: Two times a day (BID) | ORAL | Status: DC

## 2014-01-15 MED ORDER — ATORVASTATIN CALCIUM 80 MG PO TABS: 80.0000 mg | ORAL_TABLET | Freq: Every evening | ORAL | Status: DC

## 2014-01-15 MED ORDER — ATORVASTATIN CALCIUM 80 MG PO TABS
80.0000 mg | ORAL_TABLET | Freq: Every day | ORAL | Status: DC
  Filled 2014-01-15: qty 1

## 2014-01-15 MED ORDER — NITROGLYCERIN 0.4 MG SL SUBL: 0.4000 mg | SUBLINGUAL_TABLET | SUBLINGUAL | Status: DC | PRN

## 2014-01-15 MED ORDER — LISINOPRIL 5 MG PO TABS
5.0000 mg | ORAL_TABLET | Freq: Every day | ORAL | Status: DC
  Administered 2014-01-15: 5 mg via ORAL
  Filled 2014-01-15: qty 1

## 2014-01-15 MED FILL — Sodium Chloride IV Soln 0.9%: INTRAVENOUS | Qty: 50 | Status: AC

## 2014-01-15 NOTE — Progress Notes (Signed)
CSW (Clinical Child psychotherapistocial Worker) received consults for medication assistance. Consult more appropriate for RNCM. Per chart review, RNCM aware and has already spoken with pt. Please reconsult should pt have social work needs.  Balinda Heacock, LCSWA [phone]

## 2014-01-15 NOTE — Telephone Encounter (Signed)
Closed encounter °

## 2014-01-15 NOTE — Progress Notes (Signed)
CARDIAC REHAB PHASE I   PRE:  Rate/Rhythm: 70 SR    BP: sitting 160/106    SaO2: 99 RA  MODE:  Ambulation: 700 ft   POST:  Rate/Rhythm: 80 sR    BP: sitting 166/115     SaO2:   Tolerated well except increased BP. Sts he feels well. Wanting a cigarette. Good discussion of priority of taking meds and quitting smoking. Pt sts he can't quit cold [place] but is working on cutting down to quit. Pt sts his meds in July were ~300$. Needs assistance. Pt interested in [facility] and will send referral again to [place]. Pt needs financial assist and gave application. Pt eager for information. Needs rx for NTG as he has not had it since July. [id number]   Elissa LovettReeve, [name] RussiavilleKristan CES, ACSM 01/15/2014 2:04 PM

## 2014-01-15 NOTE — Care Management Note (Addendum)
    Page 1 of 1   01/15/2014     2:49:45 PM CARE MANAGEMENT NOTE 01/15/2014  Patient:  [name],[name]   Account Number:  [id number]  Date Initiated:  01/15/2014  Documentation initiated by:  Junius CreamerWELL,DEBBIE  Subjective/Objective Assessment:   adm w mi     Action/Plan:   lives w fam   Anticipated DC Date:  01/15/2014   Anticipated DC Plan:  HOME/SELF CARE      DC Planning Services  CM consult  [facility]  Medication Assistance  [facility]      Choice offered to / List presented to:             Status of service:   Medicare Important Message given?   (If response is "NO", the following Medicare IM given date fields will be blank) Date Medicare IM given:   Medicare IM given by:   Date Additional Medicare IM given:   Additional Medicare IM given by:    Discharge Disposition:  HOME/SELF CARE  Per UR Regulation:  Reviewed for med. necessity/level of care/duration of stay  If discussed at Long Length of Stay Meetings, dates discussed:    Comments:  1/5  1448 debbie Ailani Governale rn,bsn md signed brilinta pt assist form. faxed form in and gave original to pt to mail in w proof of income or letter. gave pt match letter. enc pt to follow up w [place] [facility].  1/5 0931 debbie Nirvana Blanchett rn,bsn spoke w pt. he does not have ins. gave him 30day free brilinta card. gave pt inform on [place] [facility] and explained about [place] [facility]. left brilinta pt assist form on shadow chart.

## 2014-01-15 NOTE — Discharge Summary (Signed)
Discharge Summary   Patient ID: [name] MRN: [id number], DOB/AGE: 1975/03/17 39 y.o. Admit date: 01/14/2014 D/C date:     01/15/2014  Primary Care Provider: No PCP Per Patient Primary Cardiologist: Dr. Tresa Endo  Primary Discharge Diagnoses:  1. CAD with MI with transient inferior ST elevation - s/p PTCA/DES to prox RCA, PTCA/DES to distal RCA, EF 50% 01/14/14 (peak troponin 0.25) - history: inferior STEMI s/p BMS to RCA 01/2007, NSTEMI 07/2013 with restenosis of distal RCA stent treated with cutting balloon only  2. History of medication noncompliance 3. Essential HTN 4. Hyperlipidemia, severe - LDL 272 5. Tobacco abuse 6. Prior ICM - EF 40% in 2011, 60% in 2012, EF 50% this admission  Secondary Discharge Diagnoses:  1. Bipolar 1 disorder/depression 2. H/o polysubstance abuse - a) h/o tobacco, marijuana and crack cocaine use, b) 07/2013: +UDS THC, neg for cocaine. 3. Anxiety 4. H/o NSVT - very brief run during 07/2013 admit for NSTEMI felt due to MI 5. Possible IV dye allergy although not demonstrated this admission  Hospital Course: [name] is a 39 y/o male with a history of CAD (inferior STEMI s/p BMS to RCA 01/2007, NSTEMI 07/2013 with restenosis of distal RCA stent treated with cutting balloon only due to concern for noncompliance), medical noncompliance, HTN, HLD, bipolar disorder, tobacco abuse, prior polysubstance abuse, ischemic cardiomyopathy (EF 40%->55%) who presented to [facility] via EMS with chest pain. Initially this was called as a CODE STEMI but ST segments eventually resolved. It was decided to proceed with urgent cardiac catheterization given his chest pain and significant cardiac history.   He was in his usual state of health the morning of admission when he was walking to the store to get cigarettes. It was very cold and he developed sudden onset of severe chest pain. This was associated with SOB, diaphoresis and nausea. EMS was called who found him looking very distressed.  Initial ECG revealed STE, which eventually resolved. Chest pain resolved completely after 2 SL NTG given by EMS and he was chest pain- free upon arrival. He continues to smoke cigarettes as well as marijuana. He denied other illicit drug use. He repoted that he had not taken any of his prescribed medications since July, aside from ASA, due to financial hardship. He underwent cath showing: 1. Severe stenosis proximal RCA and distal RCA 2. Otherwise residual nonobstructive LAD/RCA  3. Preserved LV systolic function 4. NSTEMI (initial ST elevation while in EMS but chest pain resolved before arrival to ED and EKG normalized) 5. Successful PTCA/DES x 1 proximal RCA 6. Successful PTCA/DES x 1 distal RCA Left Ventricular Angiogram: 50%, no MR Dr. Clifton James recommended Brilinta 90mg  BID at least one year along with statin and aspirin. The patient was adamant that he will take his medications if he is given financial assistance. Dangers of cessation discussed. Dr. Herbie Baltimore filled out his Brilinta paperwork and [name] the 30-day free Brilinta card. He was seen by care management to obtain Brilinta and medication assistance. Per care mgr, he was set up for the [facility] program to obtain free medications for a month and instructed to f/u [facility] and [facility] for further assistance. He was counseled to discontinue tobacco and marijuana use. The patient was not started on BB due to sinus bradycardia. Lisinopril was added for blood pressure. Today he is feeling better. Dr. Herbie Baltimore has seen and examined the patient today and feels he is stable for discharge.  Labs this admission also significant for  UDS with +THC/benzos (neg for cocaine), CBG 110, peak troponin 0.25, Tchol 352 with LDL 272! BNP and TSH were normal. I have sent a message to our [place] office's scheduler requesting a follow-up appointment in 1 week (transition of care for STEMI), and our office will call the patient with this information.  If unable to accomodate at [facility] office [facility]. I also requested a BMET at this visit given ACEI initiation. Would also consider f/u lipids/LFTs in 6 weeks if compliant with statin. - - -   Discharge Vitals: Physical Exam: Blood pressure 143/90, pulse 68, temperature 98 F (36.7 C), temperature source Oral, resp. rate 18, height 5\' 11"  (1.803 m), weight 131 lb 2.8 oz (59.5 kg), SpO2 98 %. General: Well developed, well nourished M, in no acute distress. Head: Normocephalic, atraumatic, sclera non-icteric, no xanthomas, nares are without discharge. Neck:  JVP not elevated. Lungs: Clear bilaterally to auscultation without wheezes, rales, or rhonchi. Breathing is unlabored. Heart: RRR S1 S2 without murmurs, rubs, or gallops.  Abdomen: Soft, non-tender, non-distended with normoactive bowel sounds. No rebound/guarding. Extremities: No clubbing or cyanosis. No edema. Distal pedal pulses are 2+ and equal bilaterally. Right radial artery without hematoma, ecchymosis or bruit. Neuro: Alert and oriented X 3. Moves all extremities spontaneously. Psych:  Responds to questions appropriately with a normal affect.  Labs: Lab Results  Component Value Date   WBC 7.1 01/15/2014   HGB 15.9 01/15/2014   HCT 45.9 01/15/2014   MCV 88.8 01/15/2014   PLT 281 01/15/2014    Recent Labs Lab 01/15/14 0558  NA 137  K 4.8  CL 101  CO2 28  BUN 9  CREATININE 1.19  CALCIUM 9.3  PROT 6.6  BILITOT 0.7  ALKPHOS 79  ALT 27  AST 21  GLUCOSE 110*    Recent Labs  01/14/14 1810 01/14/14 2334 01/15/14 0558  TROPONINI 0.09* 0.13* 0.25*   Lab Results  Component Value Date   CHOL 352* 01/15/2014   HDL 72 01/15/2014   LDLCALC 272* 01/15/2014   TRIG 42 01/15/2014   Diagnostic Studies/Procedures   Cardiac Cath 01/14/14 Cardiac Catheterization Operative Report  [name] [id number] 1/4/20161:20 PM No PCP Per Patient  Procedure Performed:  1. Left Heart  Catheterization 2. Selective Coronary Angiography 3. Left ventricular angiogram 4. PTCA/DES x 1 proximal RCA 5. PTCA/DES x 1 distal RCA  Operator: Verne Carrowhristopher McAlhany, MD  Arterial access site: Right radial artery.   Indication: 39 yo male with history of CAD with prior placement of a bare metal stent distal RCA in 2009 with restenosis of the distal RCA stent treated with cutting balloon by Dr. SwazilandJordan in July 2015. He continues to smoke. Medication non-compliance due to cost but pt willing to take medications. Code STEMI activated by EMS. Inferior ST elevation on EKG.   Procedure Details: The risks, benefits, complications, treatment options, and expected outcomes were discussed with the patient. Emergency consent was obtained. The patient was further sedated with Versed and Fentanyl. The right wrist was assessed with a modified Allens test which was positive. The right wrist was prepped and draped in a sterile fashion. 1% lidocaine was used for local anesthesia. Using the modified Seldinger access technique, a 6 French sheath was placed in the right radial artery. 3 mg Verapamil was given through the sheath. 4000 units IV heparin was given. Standard diagnostic catheters were used to perform selective coronary angiography. He was found to have sub-total  occlusion of the proximal RCA. I elected to proceed to PCI of the RCA.   PCI Note: He was given a weight based bolus of Angiomax and a drip was started. He was given Brilinta 180 mg po x 1. When the ACT was over 200, I engaged the RCA with a JR4 guiding catheter.   Lesion #1: (proximal RCA): A cougar IC wire was advanced down the RCA. A 2.5 x 15 mm balloon was used to pre-dilate the proximal RCA x 2. A 3.0 x 22 mm Resolute DES was placed in the proximal RCA. The stent was post-dilated with a 3.25 x 15 mm [place] balloon x 2. The stenosis was taken from 99% down to 0%.   Lesion #2: (distal RCA): A 2.5 x 15 mm balloon  was used to pre-dilate the distal area of stenosis just before the old stent and just inside the proximal edge of the stent. A 3.0 x 22 mm Resolute DES was deployed in the distal RCA covering the old stented segment and covering the lesion just proximal to the old stent. The stent was post-dilated x 2 with a 3.25 x 15 mm [place] balloon. The stenosis was taken from 80% down to 0%.   Of note, DES used given the need for multiple stents and prior restenosis in his bare metal stent. He is adamant that he will take his medications if he is given financial assistance.   A pigtail catheter was used to perform a left ventricular angiogram. The sheath was removed from the right radial artery and a Terumo hemostasis band was applied at the arteriotomy site on the right wrist.   There were no immediate complications. The patient was taken to the recovery area in stable condition.   Hemodynamic Findings: Central aortic pressure: 136/81 Left ventricular pressure: 115/4/18  Left circumflex (LCx): The LCx is a large vessel giving rise to 2 OM branches. There is diffuse ectasia. There is a 50% stenosis in the mid LCx.  Angiographic Findings:  Left main: No obstructive disease.   Left Anterior Descending Artery:Large caliber vessel that courses to the apex with diffuse 20-30% stenosis in the proximal, mid and distal vessel. The first diagonal is occluded proximally. The second diagonal is a very small caliber vessel with ostial 70% stenosis.   Circumflex Artery: Large caliber vessel with diffuse ectasia giving rise to 2 obtuse marginal branches. The first OM branch has a 30% stenosis. The mid Circumflex has a 50% stenosis.   Right Coronary Artery: Large caliber dominant vessel with 99% sub-total occlusion proximally with appearance of thrombus. The mid vessel has a 40% stenosis. The distal vessel has a stented segment with 80% proximal edge restenosis as well as a 80% stenosis just proximal to the stented  segment.   Left Ventricular Angiogram: 50%, no MR  Impression: 1. Severe single vessel CAD 2. Severe stenosis proximal RCA and distal RCA 3. Preserved LV systolic function 4. NSTEMI (initial ST elevation while in EMS but chest pain resolved before arrival to ED and EKG normalized) 5. Successful PTCA/DES x 1 proximal RCA 6. Successful PTCA/DES x 1 distal RCA  Recommendations: He will need Brilinta 90 mg po BID for at least one year. Will get him into the assistance program. Continue ASA and statin. Start beta blocker if HR allows. Angiomax for 2 hours at reduced rate then d/c.    Complications: None. The patient tolerated the procedure well.    Discharge Medications   Current Discharge Medication List  START taking these medications   Details  lisinopril (PRINIVIL,ZESTRIL) 5 MG tablet Take 1 tablet (5 mg total) by mouth daily. Qty: 30 tablet, Refills: 6    nitroGLYCERIN (NITROSTAT) 0.4 MG SL tablet Place 1 tablet (0.4 mg total) under the tongue every 5 (five) minutes as needed for chest pain (up to 3 doses). Qty: 25 tablet, Refills: 3    ticagrelor (BRILINTA) 90 MG TABS tablet Take 1 tablet (90 mg total) by mouth 2 (two) times daily. Qty: 60 tablet, Refills: 11      CONTINUE these medications which have CHANGED   Details  atorvastatin (LIPITOR) 80 MG tablet Take 1 tablet (80 mg total) by mouth every evening. Qty: 30 tablet, Refills: 6      CONTINUE these medications which have NOT CHANGED   Details  aspirin EC 81 MG tablet Take 81 mg by mouth daily.    DimenhyDRINATE (DRAMAMINE PO) Take 1 tablet by mouth daily as needed (motion sickness).        Disposition   The patient will be discharged in stable condition to home. Discharge Instructions    Diet - low sodium heart healthy    Complete by:  As directed      Increase activity slowly    Complete by:  As directed   No driving for 1 week. No lifting over 10 lbs for 2 weeks. No sexual activity  for 2 weeks. You may not return to work until cleared at your follow-up appointment. Keep procedure site clean & dry. If you notice increased pain, swelling, bleeding or pus, call/return!  You may shower, but no soaking baths/hot tubs/pools for 1 week.          Follow-up Information    Follow up with Lennette Bihari, MD.   Specialty:  Cardiology   Why:  Office will call you for your followup appointment to be seen back within 7 days. Call office if you have not heard back in 3 days.   Contact information:   [address] [place] [address] [phone]         Duration of Discharge Encounter: Greater than 30 minutes including physician and PA time.  Signed, Dayna Dunn PA-C 01/15/2014, 2:07 PM

## 2014-01-18 LAB — THC (MARIJUANA), URINE, CONFIRMATION: Marijuana, Ur-Confirmation: 713 ng/mL — ABNORMAL HIGH (ref <5)

## 2014-01-18 LAB — BENZODIAZEPINE, QUANTITATIVE, URINE
Alprazolam metabolite (GC/LC/MS), ur confirm: NEGATIVE ng/mL (ref <25)
Clonazepam metabolite (GC/LC/MS), ur confirm: NEGATIVE ng/mL (ref <25)
Flurazepam GC/MS Conf: NEGATIVE ng/mL (ref <50)
Lorazepam (GC/LC/MS), ur confirm: NEGATIVE ng/mL (ref <50)
Midazolam (GC/LC/MS), ur confirm: 3493 ng/mL — ABNORMAL HIGH (ref <50)
Nordiazepam GC/MS Conf: NEGATIVE ng/mL (ref <50)
Oxazepam GC/MS Conf: NEGATIVE ng/mL (ref <50)
TRIAZOLAMU: NEGATIVE ng/mL (ref <50)
Temazepam GC/MS Conf: NEGATIVE ng/mL (ref <50)

## 2014-01-21 ENCOUNTER — Ambulatory Visit (INDEPENDENT_AMBULATORY_CARE_PROVIDER_SITE_OTHER): Payer: Self-pay | Admitting: Nurse Practitioner

## 2014-01-21 ENCOUNTER — Other Ambulatory Visit (INDEPENDENT_AMBULATORY_CARE_PROVIDER_SITE_OTHER): Payer: Self-pay | Admitting: *Deleted

## 2014-01-21 ENCOUNTER — Encounter: Payer: Self-pay | Admitting: Nurse Practitioner

## 2014-01-21 ENCOUNTER — Ambulatory Visit (INDEPENDENT_AMBULATORY_CARE_PROVIDER_SITE_OTHER): Payer: Self-pay | Admitting: Pharmacist

## 2014-01-21 VITALS — BP 110/70 | HR 58 | Ht 72.0 in | Wt 145.8 lb

## 2014-01-21 DIAGNOSIS — Z720 Tobacco use: Secondary | ICD-10-CM

## 2014-01-21 DIAGNOSIS — I214 Non-ST elevation (NSTEMI) myocardial infarction: Secondary | ICD-10-CM

## 2014-01-21 DIAGNOSIS — I10 Essential (primary) hypertension: Secondary | ICD-10-CM

## 2014-01-21 DIAGNOSIS — E785 Hyperlipidemia, unspecified: Secondary | ICD-10-CM

## 2014-01-21 DIAGNOSIS — I255 Ischemic cardiomyopathy: Secondary | ICD-10-CM

## 2014-01-21 DIAGNOSIS — Z955 Presence of coronary angioplasty implant and graft: Secondary | ICD-10-CM

## 2014-01-21 LAB — BASIC METABOLIC PANEL
BUN: 7 mg/dL (ref 6–23)
CHLORIDE: 106 meq/L (ref 96–112)
CO2: 24 mEq/L (ref 19–32)
CREATININE: 1.1 mg/dL (ref 0.4–1.5)
Calcium: 8.6 mg/dL (ref 8.4–10.5)
GFR: 98.22 mL/min (ref >60.00)
Glucose, Bld: 70 mg/dL (ref 70–99)
Potassium: 4.3 mEq/L (ref 3.5–5.1)
Sodium: 136 mEq/L (ref 135–145)

## 2014-01-21 NOTE — Patient Instructions (Signed)
We will be checking the following labs today - BMET and CBC  Take only one aspirin 81 mg a day  Stay on your other medicines  STOP SMOKING!!!!  I will see you in 4 weeks with fasting labs.  Call [facility] office at [phone] if you have any questions, problems or concerns.

## 2014-01-21 NOTE — Progress Notes (Signed)
[name] Date of Birth: [birth year] Medical Record #[id number]  History of Present Illness: [name] is seen back today for a post hospital visit/TOC - however no appropriate phone call has been made. Seen for Dr. Tresa EndoKelly. He is a 39 year old male with a history of medication noncompliance, HTN, HLD, tobaco abuse and prior ICM with EF 40% in 2011, 60% in 2012. He has known CAD with past inferior STEMI with BMS to RCA in 2009, NSTEMI in July of 2015 with restenosis of distal RCA stent treated with cutting balloon only - and most recently - with PCI/DES to proximal RCA, PTCA/DES to distal RCA and EF of 50% on January 14, 2014. His other issues are as noted below.   Comes in today. Here alone. He is taking 2 aspirins a day - not clear as to why - thought he was suppose to since his Brilinta was twice a day. Smoking less but continues to smoke. Denies other drug use but his drug screen was + for marijuana.  No more chest pain. He is taking his medicines. Never on cholesterol medicines before - lipids very high. Asking about going to work - has not worked in 4 to 5 years because of his heart. He is walking. Not dizzy or lightheaded. No bleeding.  Says he is not eligible for any government assistance except food stamps. Working on his papers to qualify for free Brilinta.    Current Outpatient Prescriptions  Medication Sig Dispense Refill  . aspirin EC 81 MG tablet Take 81 mg by mouth daily.    Marland Kitchen. atorvastatin (LIPITOR) 80 MG tablet Take 1 tablet (80 mg total) by mouth every evening. 30 tablet 6  . DimenhyDRINATE (DRAMAMINE PO) Take 1 tablet by mouth daily as needed (motion sickness).    Marland Kitchen. lisinopril (PRINIVIL,ZESTRIL) 5 MG tablet Take 1 tablet (5 mg total) by mouth daily. 30 tablet 6  . nitroGLYCERIN (NITROSTAT) 0.4 MG SL tablet Place 1 tablet (0.4 mg total) under the tongue every 5 (five) minutes as needed for chest pain (up to 3 doses). 25 tablet 3  . ticagrelor (BRILINTA) 90 MG TABS tablet Take  1 tablet (90 mg total) by mouth 2 (two) times daily. 60 tablet 11   No current facility-administered medications for this visit.    Allergies  Allergen Reactions  . Iodine Shortness Of Breath and Swelling  . Shellfish Allergy Anaphylaxis    All shellfish  . Contrast Media [Iodinated Diagnostic Agents] Nausea And Vomiting    Past Medical History  Diagnosis Date  . Bipolar 1 disorder   . Hypertension   . Hyperlipidemia   . Hx of medication noncompliance   . Polysubstance abuse     a. h/o tobacco, marijuana and crack cocaine use. b. 07/2013: +UDS THC, neg for cocaine.  . Anxiety   . CAD S/P percutaneous coronary angioplasty     a. inferior STEMI s/p BMS to RCA 01/2007. b. NSTEMI 07/2013 s/p PTCA to RCA for restenosis (no stenting due to hx noncompliance). c. Transient inferior ST elevation (peak troponin 0.25) 01/2014 s/p PTCA/DES to prox RCA, PTCA/DES to distal RCA, EF 50%.  . NSVT (nonsustained ventricular tachycardia)     a. Very brief run during 07/2013 admit for NSTEMI felt due to MI.  . Ischemic cardiomyopathy     a. EF 40% in 2011, 60% in 2012. b. EF 55% by cath 07/2013. c. EF 50% by cath 01/2014.  . Tobacco abuse     Past  Surgical History  Procedure Laterality Date  . Femoral artery stent    . Coronary angioplasty with stent placement  02/05/2007    3.5x42mm Quantum non-DES to RCA (Dr. Nicki Guadalajara)  . Cardiac catheterization  01/16/2009    normal left main, Cfx with 2-OMs both w/minor irregularities, LAd with 20-30% mid region irregularities, ramus intermediate/optional diagonal with 60% osital narrowing, RCA with stent in distal portion w/20% prox in-stent stenosis (Dr. Mervyn Skeeters. [place])  . Cardiac catheterization  07/23/2013    two vessel obstructive CAD, occluded first diagonal, focal in-stent restenosis in distal RCA (Dr. Peter Swaziland)  . Left heart catheterization with coronary angiogram N/A 07/23/2013    Procedure: LEFT HEART CATHETERIZATION WITH CORONARY ANGIOGRAM;  Surgeon: Peter  M Swaziland, MD;  Location: [facility] CATH LAB;  Service: Cardiovascular;  Laterality: N/A;  . Left and right heart catheterization with coronary angiogram N/A 01/14/2014    Procedure: LEFT AND RIGHT HEART CATHETERIZATION WITH CORONARY ANGIOGRAM;  Surgeon: Kathleene Hazel, MD;  Location: [facility] CATH LAB;  Service: Cardiovascular;  Laterality: N/A;    History  Smoking status  . Current Every Day Smoker -- 0.50 packs/day  . Types: Cigarettes  Smokeless tobacco  . Not on file    History  Alcohol Use  . Yes    Family History  Problem Relation Age of Onset  . CAD Mother   . CAD Brother   . CAD Sister     Review of Systems: The review of systems is per the HPI.  All other systems were reviewed and are negative.  Physical Exam: BP 110/70 mmHg  Pulse 58  Ht 6' (1.829 m)  Wt 145 lb 12.8 oz (66.134 kg)  BMI 19.77 kg/m2  SpO2 100% Patient is pleasant and in no acute distress. Smells of tobacco. Skin is warm and dry. Color is normal.  HEENT is unremarkable. Normocephalic/atraumatic. PERRL. Sclera are nonicteric. Neck is supple. No masses. No JVD. Lungs are fairly clear. Cardiac exam shows a regular rate and rhythm. Abdomen is soft. Extremities are without edema. Gait and ROM are intact. No gross neurologic deficits noted.  Wt Readings from Last 3 Encounters:  01/21/14 145 lb 12.8 oz (66.134 kg)  01/15/14 131 lb 2.8 oz (59.5 kg)  08/23/13 134 lb 11.2 oz (61.1 kg)    LABORATORY DATA/PROCEDURES: PENDING  Lab Results  Component Value Date   WBC 7.1 01/15/2014   HGB 15.9 01/15/2014   HCT 45.9 01/15/2014   PLT 281 01/15/2014   GLUCOSE 110* 01/15/2014   CHOL 352* 01/15/2014   TRIG 42 01/15/2014   HDL 72 01/15/2014   LDLCALC 272* 01/15/2014   ALT 27 01/15/2014   AST 21 01/15/2014   NA 137 01/15/2014   K 4.8 01/15/2014   CL 101 01/15/2014   CREATININE 1.19 01/15/2014   BUN 9 01/15/2014   CO2 28 01/15/2014   TSH 0.807 01/14/2014   INR 1.04 01/15/2014   HGBA1C 5.8* 01/14/2014     BNP (last 3 results)  Recent Labs  04/15/13 2124 07/21/13 0530  PROBNP 48.2 51.6   Cardiac Catheterization Operative Report  AIDYNN KRENN [id number] 1/4/20161:20 PM No PCP Per Patient  Procedure Performed:  1. Left Heart Catheterization 2. Selective Coronary Angiography 3. Left ventricular angiogram 4. PTCA/DES x 1 proximal RCA 5. PTCA/DES x 1 distal RCA  Operator: Verne Carrow, MD  Arterial access site: Right radial artery.   Indication: 39 yo male with history of CAD with prior placement of a bare metal stent  distal RCA in 2009 with restenosis of the distal RCA stent treated with cutting balloon by Dr. Swaziland in July 2015. He continues to smoke. Medication non-compliance due to cost but pt willing to take medications. Code STEMI activated by EMS. Inferior ST elevation on EKG.   Procedure Details: The risks, benefits, complications, treatment options, and expected outcomes were discussed with the patient. Emergency consent was obtained. The patient was further sedated with Versed and Fentanyl. The right wrist was assessed with a modified Allens test which was positive. The right wrist was prepped and draped in a sterile fashion. 1% lidocaine was used for local anesthesia. Using the modified Seldinger access technique, a 6 French sheath was placed in the right radial artery. 3 mg Verapamil was given through the sheath. 4000 units IV heparin was given. Standard diagnostic catheters were used to perform selective coronary angiography. He was found to have sub-total occlusion of the proximal RCA. I elected to proceed to PCI of the RCA.   PCI Note: He was given a weight based bolus of Angiomax and a drip was started. He was given Brilinta 180 mg po x 1. When the ACT was over 200, I engaged the RCA with a JR4 guiding catheter.   Lesion #1: (proximal RCA): A cougar IC wire was advanced down the RCA. A 2.5 x 15 mm balloon was used to  pre-dilate the proximal RCA x 2. A 3.0 x 22 mm Resolute DES was placed in the proximal RCA. The stent was post-dilated with a 3.25 x 15 mm [place] balloon x 2. The stenosis was taken from 99% down to 0%.   Lesion #2: (distal RCA): A 2.5 x 15 mm balloon was used to pre-dilate the distal area of stenosis just before the old stent and just inside the proximal edge of the stent. A 3.0 x 22 mm Resolute DES was deployed in the distal RCA covering the old stented segment and covering the lesion just proximal to the old stent. The stent was post-dilated x 2 with a 3.25 x 15 mm [place] balloon. The stenosis was taken from 80% down to 0%.   Of note, DES used given the need for multiple stents and prior restenosis in his bare metal stent. He is adamant that he will take his medications if he is given financial assistance.   A pigtail catheter was used to perform a left ventricular angiogram. The sheath was removed from the right radial artery and a Terumo hemostasis band was applied at the arteriotomy site on the right wrist.   There were no immediate complications. The patient was taken to the recovery area in stable condition.   Hemodynamic Findings: Central aortic pressure: 136/81 Left ventricular pressure: 115/4/18  Left circumflex (LCx): The LCx is a large vessel giving rise to 2 OM branches. There is diffuse ectasia. There is a 50% stenosis in the mid LCx.  Angiographic Findings:  Left main: No obstructive disease.   Left Anterior Descending Artery:Large caliber vessel that courses to the apex with diffuse 20-30% stenosis in the proximal, mid and distal vessel. The first diagonal is occluded proximally. The second diagonal is a very small caliber vessel with ostial 70% stenosis.   Circumflex Artery: Large caliber vessel with diffuse ectasia giving rise to 2 obtuse marginal branches. The first OM branch has a 30% stenosis. The mid Circumflex has a 50% stenosis.   Right Coronary Artery: Large caliber  dominant vessel with 99% sub-total occlusion proximally with appearance of thrombus. The mid  vessel has a 40% stenosis. The distal vessel has a stented segment with 80% proximal edge restenosis as well as a 80% stenosis just proximal to the stented segment.   Left Ventricular Angiogram: 50%, no MR  Impression: 1. Severe single vessel CAD 2. Severe stenosis proximal RCA and distal RCA 3. Preserved LV systolic function 4. NSTEMI (initial ST elevation while in EMS but chest pain resolved before arrival to ED and EKG normalized) 5. Successful PTCA/DES x 1 proximal RCA 6. Successful PTCA/DES x 1 distal RCA  Recommendations: He will need Brilinta 90 mg po BID for at least one year. Will get him into the assistance program. Continue ASA and statin. Start beta blocker if HR allows. Angiomax for 2 hours at reduced rate then d/c.    Complications: None. The patient tolerated the procedure well.     Assessment / Plan: 1. CAD - prior NSTEMI with past PCIs and now with most recent STEMI with PCI x 2 to the RCA - doing well clinically. Encouraged him to complete his paperwork for his Brilinta assistance.   2. Medical noncompliance -  Seems motivated to make changes. Reminded that he would have more money if not smoking (all agents).  3. HTN -  BP looks fine.  4. HLD -  Now on high dose Lipitor - needs fasting labs in one month.   5. Tobacco abuse -  Seems motivated to stop - has cut back - he is going to try to stop cold [place] - total cessation encouraged.  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   I will see back in a month with fasting labs.   Patient is agreeable to this plan and will call if any problems develop in the interim.   Rosalio Macadamia, RN, ANP-C [facility] [address] [place], [place]  [address] [phone]

## 2014-01-21 NOTE — Progress Notes (Signed)
Pharmacy Transitions of Care Post-ACS Visit  Admit Complaint: [name] is a 39 yo male who was discharged on 01/25/14 from [facility] after having an NSTEMI s/p placement of 2 DES. Other PMH significant for CAD, HTN, HLD, depression, anxiety, and bipolar. Patient presents to clinic for pharmacy medication reconciliation.  Medications and allergies were reviewed with patient.   Current Outpatient Prescriptions  Medication Sig Dispense Refill  . aspirin EC 81 MG tablet Take 81 mg by mouth daily.    Marland Kitchen. atorvastatin (LIPITOR) 80 MG tablet Take 1 tablet (80 mg total) by mouth every evening. 30 tablet 6  . DimenhyDRINATE (DRAMAMINE PO) Take 1 tablet by mouth daily as needed (motion sickness).    Marland Kitchen. lisinopril (PRINIVIL,ZESTRIL) 5 MG tablet Take 1 tablet (5 mg total) by mouth daily. 30 tablet 6  . nitroGLYCERIN (NITROSTAT) 0.4 MG SL tablet Place 1 tablet (0.4 mg total) under the tongue every 5 (five) minutes as needed for chest pain (up to 3 doses). 25 tablet 3  . ticagrelor (BRILINTA) 90 MG TABS tablet Take 1 tablet (90 mg total) by mouth 2 (two) times daily. 60 tablet 11   No current facility-administered medications for this visit.    ACS Medication Checklist:     Medication      YES      NO N/A or C/I and Explanation  Beta Blocker []  [x]  Pt bradycardic on hospital d/c with HR of 68. HR 58 in clinic today.  ACEi or ARB [x]  []     High-dose statin    (atorvastatin 40 or 80mg , rosuvastatin 20 or 40mg ) [x]  []    Clopidogrel +/- other         anti-platelets [x]         []     Aspirin [x]  []    Nitroglycerin [x]  []     Smoking cessation counseling provided [x]  []  Pt used to smoke 1 pack every 2 days. Since he was discharged from the hospital last week, he has only smoked 1 pack. Has successfully quit for 2 years in the past and is interested in quitting again. Wants to taper down smoking himself rather than using medications. Seems motivated to quit cold [place].  Cardiac rehab referral [x]         []   Pt has paperwork and is in the process of filling it out.    Assessment/Plan: refer to chart below.    Interventions during today's visit include: Intervention     YES      NO  Explanation   Indications      Needs Therapy []  [x]     Unnecessary Therapy []  [x]    Efficacy     Suboptimal Drug Selection []  [x]    Insufficient Dose/Duration []  [x]     Safety      Adverse Drug Event []  [x]     Drug Interaction []  [x]     Excessive Dose/Duration []  [x]    Compliance     Underuse []  [x]     Overuse [x]  []  Pt reported taking ASA twice a day with his Brilinta.   Spent 20 minutes with patient today thoroughly reviewing his medications. Discussed the importance of adherence and smoking cessation. Reinforced that pt should be taking ASA 81mg  once daily not twice daily with his Brilinta. Will f/u compliance with meds in 1 month with phone call follow-up.  [name], Pharm.D Clinical Pharmacy Resident Pager: [phone] 01/21/2014 8:38 AM

## 2014-02-20 ENCOUNTER — Ambulatory Visit (INDEPENDENT_AMBULATORY_CARE_PROVIDER_SITE_OTHER): Payer: Self-pay | Admitting: Nurse Practitioner

## 2014-02-20 ENCOUNTER — Other Ambulatory Visit: Payer: Self-pay | Admitting: *Deleted

## 2014-02-20 ENCOUNTER — Encounter: Payer: Self-pay | Admitting: Nurse Practitioner

## 2014-02-20 VITALS — BP 108/80 | HR 65 | Temp 97.7°F | Ht 72.0 in | Wt 138.4 lb

## 2014-02-20 DIAGNOSIS — I214 Non-ST elevation (NSTEMI) myocardial infarction: Secondary | ICD-10-CM

## 2014-02-20 DIAGNOSIS — R945 Abnormal results of liver function studies: Principal | ICD-10-CM

## 2014-02-20 DIAGNOSIS — E785 Hyperlipidemia, unspecified: Secondary | ICD-10-CM

## 2014-02-20 DIAGNOSIS — R7989 Other specified abnormal findings of blood chemistry: Secondary | ICD-10-CM

## 2014-02-20 DIAGNOSIS — I259 Chronic ischemic heart disease, unspecified: Secondary | ICD-10-CM

## 2014-02-20 DIAGNOSIS — Z720 Tobacco use: Secondary | ICD-10-CM

## 2014-02-20 DIAGNOSIS — Z955 Presence of coronary angioplasty implant and graft: Secondary | ICD-10-CM

## 2014-02-20 LAB — HEPATIC FUNCTION PANEL
ALT: 113 U/L — ABNORMAL HIGH (ref 0–53)
AST: 72 U/L — ABNORMAL HIGH (ref 0–37)
Albumin: 4.2 g/dL (ref 3.5–5.2)
Alkaline Phosphatase: 81 U/L (ref 39–117)
Bilirubin, Direct: 0.1 mg/dL (ref 0.0–0.3)
Total Bilirubin: 0.5 mg/dL (ref 0.2–1.2)
Total Protein: 6.7 g/dL (ref 6.0–8.3)

## 2014-02-20 LAB — LIPID PANEL
Cholesterol: 225 mg/dL — ABNORMAL HIGH (ref 0–200)
HDL: 77.9 mg/dL (ref >39.00)
LDL Cholesterol: 133 mg/dL — ABNORMAL HIGH (ref 0–99)
NonHDL: 147.1
Total CHOL/HDL Ratio: 3
Triglycerides: 72 mg/dL (ref 0.0–149.0)
VLDL: 14.4 mg/dL (ref 0.0–40.0)

## 2014-02-20 LAB — BASIC METABOLIC PANEL
BUN: 8 mg/dL (ref 6–23)
CO2: 25 mEq/L (ref 19–32)
Calcium: 9.4 mg/dL (ref 8.4–10.5)
Chloride: 106 mEq/L (ref 96–112)
Creatinine, Ser: 1.09 mg/dL (ref 0.40–1.50)
GFR: 98.17 mL/min (ref >60.00)
Glucose, Bld: 91 mg/dL (ref 70–99)
Potassium: 4.6 mEq/L (ref 3.5–5.1)
Sodium: 138 mEq/L (ref 135–145)

## 2014-02-20 MED ORDER — ATORVASTATIN CALCIUM 80 MG PO TABS: 80.0000 mg | ORAL_TABLET | Freq: Every evening | ORAL | Status: DC

## 2014-02-20 MED ORDER — LISINOPRIL 5 MG PO TABS: 5.0000 mg | ORAL_TABLET | Freq: Every day | ORAL | Status: DC

## 2014-02-20 MED ORDER — ATORVASTATIN CALCIUM 80 MG PO TABS: 40.0000 mg | ORAL_TABLET | Freq: Every evening | ORAL | Status: DC

## 2014-02-20 NOTE — Patient Instructions (Addendum)
We will be checking the following labs today BMET, HPF and Lipids  Stay on your current medicines - I have refilled the Lisinopril and Lipitor today  Keep working on your smoking  See Dr. Tresa EndoKelly in 2 months  Call the [facility] office at [phone] if you have any questions, problems or concerns.

## 2014-02-20 NOTE — Progress Notes (Signed)
CARDIOLOGY OFFICE NOTE  Date:  02/20/2014    Justin Bameyrone J Crosley Date of Birth: 03/08/1975 Medical Record #[id number]  PCP:  No PCP Per Patient  Cardiologist:  Tresa EndoKelly   Chief Complaint  Patient presents with  . Coronary Artery Disease    1 month check - seen for Dr. Tresa EndoKelly     History of Present Illness: [name] is a 39 y.o. male who presents today for a one month check. Seen for Dr. Tresa EndoKelly. He is a 39 year old male with a history of medication noncompliance, HTN, HLD, tobaco abuse and prior ICM with EF 40% in 2011, 60% in 2012. He has known CAD with past inferior STEMI with BMS to RCA in 2009, NSTEMI in July of 2015 with restenosis of distal RCA stent treated with cutting balloon only - and most recently - with PCI/DES to proximal RCA, PTCA/DES to distal RCA and EF of 50% on January 14, 2014. His other issues are as noted below.   I saw him in the flex clinic a month ago - post op visit - no chest pain. Continued to have ongoing tobacco abuse with cigarettes and marijuana but was motivated to stop.   Comes in today. Here alone. He is doing well. No chest pain. Not short of breath. Down to smoking just 3 cigarettes a day. Ran out of his Lisinopril yesterday. Almost out of Lipitor. He did get his Brilinta for the next year thru the assistance program. His weight will fluctuate up and down based on his mood and stress level.  Past Medical History  Diagnosis Date  . Bipolar 1 disorder   . Hypertension   . Hyperlipidemia   . Hx of medication noncompliance   . Polysubstance abuse     a. h/o tobacco, marijuana and crack cocaine use. b. 07/2013: +UDS THC, neg for cocaine.  . Anxiety   . CAD S/P percutaneous coronary angioplasty     a. inferior STEMI s/p BMS to RCA 01/2007. b. NSTEMI 07/2013 s/p PTCA to RCA for restenosis (no stenting due to hx noncompliance). c. Transient inferior ST elevation (peak troponin 0.25) 01/2014 s/p PTCA/DES to prox RCA, PTCA/DES to distal RCA, EF 50%.  .  NSVT (nonsustained ventricular tachycardia)     a. Very brief run during 07/2013 admit for NSTEMI felt due to MI.  . Ischemic cardiomyopathy     a. EF 40% in 2011, 60% in 2012. b. EF 55% by cath 07/2013. c. EF 50% by cath 01/2014.  . Tobacco abuse     Past Surgical History  Procedure Laterality Date  . Femoral artery stent    . Coronary angioplasty with stent placement  02/05/2007    3.5x3418mm Quantum non-DES to RCA (Dr. Nicki Guadalajarahomas Kelly)  . Cardiac catheterization  01/16/2009    normal left main, Cfx with 2-OMs both w/minor irregularities, LAd with 20-30% mid region irregularities, ramus intermediate/optional diagonal with 60% osital narrowing, RCA with stent in distal portion w/20% prox in-stent stenosis (Dr. Mervyn SkeetersA. Little)  . Cardiac catheterization  07/23/2013    two vessel obstructive CAD, occluded first diagonal, focal in-stent restenosis in distal RCA (Dr. Peter SwazilandJordan)  . Left heart catheterization with coronary angiogram N/A 07/23/2013    Procedure: LEFT HEART CATHETERIZATION WITH CORONARY ANGIOGRAM;  Surgeon: Peter M SwazilandJordan, MD;  [facility] C[facility] LAB;  Service: Cardiovascular;  Laterality: N/A;  . Left and right heart catheterization with coronary angiogram N/A 01/14/2014    Procedure: LEFT AND RIGHT HEART  CATHETERIZATION WITH CORONARY ANGIOGRAM;  Surgeon: Kathleene Hazel, MD;  Location: [facility] CATH LAB;  Service: Cardiovascular;  Laterality: N/A;     Medications: Current Outpatient Prescriptions  Medication Sig Dispense Refill  . aspirin EC 81 MG tablet Take 81 mg by mouth daily.    Marland Kitchen atorvastatin (LIPITOR) 80 MG tablet Take 1 tablet (80 mg total) by mouth every evening. 30 tablet 6  . DimenhyDRINATE (DRAMAMINE PO) Take 1 tablet by mouth daily as needed (motion sickness).    Marland Kitchen lisinopril (PRINIVIL,ZESTRIL) 5 MG tablet Take 1 tablet (5 mg total) by mouth daily. 30 tablet 6  . nitroGLYCERIN (NITROSTAT) 0.4 MG SL tablet Place 1 tablet (0.4 mg total) under the tongue every 5 (five) minutes as  needed for chest pain (up to 3 doses). 25 tablet 3  . ticagrelor (BRILINTA) 90 MG TABS tablet Take 1 tablet (90 mg total) by mouth 2 (two) times daily. 60 tablet 11   No current facility-administered medications for this visit.    Allergies: Allergies  Allergen Reactions  . Iodine Shortness Of Breath and Swelling  . Shellfish Allergy Anaphylaxis    All shellfish  . Contrast Media [Iodinated Diagnostic Agents] Nausea And Vomiting    Social History: The patient  reports that he has been smoking Cigarettes.  He has been smoking about 0.50 packs per day. He does not have any smokeless tobacco history on file. He reports that he drinks alcohol. He reports that he uses illicit drugs (Marijuana).   Family History: The patient's family history includes CAD in his mother and sister; Heart attack in his mother; Heart disease in his mother.   Review of Systems: Please see the history of present illness.   Otherwise, the review of systems is positive for appetite changes, recent chills, DOE and sweats when he eats.   All other systems are reviewed and negative.   Physical Exam: VS:  BP 108/80 mmHg  Pulse 65  Temp(Src) 97.7 F (36.5 C)  Ht 6' (1.829 m)  Wt 138 lb 6.4 oz (62.778 kg)  BMI 18.77 kg/m2  SpO2 99% .  BMI Body mass index is 18.77 kg/(m^2).  Wt Readings from Last 3 Encounters:  02/20/14 138 lb 6.4 oz (62.778 kg)  01/21/14 145 lb 12.8 oz (66.134 kg)  01/15/14 131 lb 2.8 oz (59.5 kg)    General: Pleasant. Well developed, well nourished and in no acute distress.  HEENT: Normal. Neck: Supple, no JVD, carotid bruits, or masses noted.  Cardiac: Regular rate and rhythm. No murmurs, rubs, or gallops. No edema.  Respiratory:  Lungs are clear to auscultation bilaterally with normal work of breathing.  GI: Soft and nontender.  MS: No deformity or atrophy. Gait and ROM intact. Skin: Warm and dry. Color is normal.  Neuro:  Strength and sensation are intact and no gross focal deficits  noted.  Psych: Alert, appropriate and with normal affect.   LABORATORY DATA:  EKG:  EKG is not ordered today.  Lab Results  Component Value Date   WBC 7.1 01/15/2014   HGB 15.9 01/15/2014   HCT 45.9 01/15/2014   PLT 281 01/15/2014   GLUCOSE 70 01/21/2014   CHOL 352* 01/15/2014   TRIG 42 01/15/2014   HDL 72 01/15/2014   LDLCALC 272* 01/15/2014   ALT 27 01/15/2014   AST 21 01/15/2014   NA 136 01/21/2014   K 4.3 01/21/2014   CL 106 01/21/2014   CREATININE 1.1 01/21/2014   BUN 7 01/21/2014   CO2  24 01/21/2014   TSH 0.807 01/14/2014   INR 1.04 01/15/2014   HGBA1C 5.8* 01/14/2014    BNP (last 3 results)  Recent Labs  01/14/14 1810  BNP 29.0    ProBNP (last 3 results)  Recent Labs  04/15/13 2124 07/21/13 0530  PROBNP 48.2 51.6     Other Studies Reviewed Today:  Cardiac Catheterization Operative Report  GILMAR BUA [id number] 1/4/20161:20 PM No PCP Per Patient  Procedure Performed:  1. Left Heart Catheterization 2. Selective Coronary Angiography 3. Left ventricular angiogram 4. PTCA/DES x 1 proximal RCA 5. PTCA/DES x 1 distal RCA  Operator: Verne Carrow, MD  Arterial access site: Right radial artery.   Indication: 39 yo male with history of CAD with prior placement of a bare metal stent distal RCA in 2009 with restenosis of the distal RCA stent treated with cutting balloon by Dr. Swaziland in July 2015. He continues to smoke. Medication non-compliance due to cost but pt willing to take medications. Code STEMI activated by EMS. Inferior ST elevation on EKG.   Procedure Details: The risks, benefits, complications, treatment options, and expected outcomes were discussed with the patient. Emergency consent was obtained. The patient was further sedated with Versed and Fentanyl. The right wrist was assessed with a modified Allens test which was positive. The right wrist was prepped and draped in a sterile  fashion. 1% lidocaine was used for local anesthesia. Using the modified Seldinger access technique, a 6 French sheath was placed in the right radial artery. 3 mg Verapamil was given through the sheath. 4000 units IV heparin was given. Standard diagnostic catheters were used to perform selective coronary angiography. He was found to have sub-total occlusion of the proximal RCA. I elected to proceed to PCI of the RCA.   PCI Note: He was given a weight based bolus of Angiomax and a drip was started. He was given Brilinta 180 mg po x 1. When the ACT was over 200, I engaged the RCA with a JR4 guiding catheter.   Lesion #1: (proximal RCA): A cougar IC wire was advanced down the RCA. A 2.5 x 15 mm balloon was used to pre-dilate the proximal RCA x 2. A 3.0 x 22 mm Resolute DES was placed in the proximal RCA. The stent was post-dilated with a 3.25 x 15 mm [place] balloon x 2. The stenosis was taken from 99% down to 0%.   Lesion #2: (distal RCA): A 2.5 x 15 mm balloon was used to pre-dilate the distal area of stenosis just before the old stent and just inside the proximal edge of the stent. A 3.0 x 22 mm Resolute DES was deployed in the distal RCA covering the old stented segment and covering the lesion just proximal to the old stent. The stent was post-dilated x 2 with a 3.25 x 15 mm [place] balloon. The stenosis was taken from 80% down to 0%.   Of note, DES used given the need for multiple stents and prior restenosis in his bare metal stent. He is adamant that he will take his medications if he is given financial assistance.   A pigtail catheter was used to perform a left ventricular angiogram. The sheath was removed from the right radial artery and a Terumo hemostasis band was applied at the arteriotomy site on the right wrist.   There were no immediate complications. The patient was taken to the recovery area in stable condition.   Hemodynamic Findings: Central aortic pressure: 136/81 Left ventricular pressure:  115/4/18  Left circumflex (LCx): The LCx is a large vessel giving rise to 2 OM branches. There is diffuse ectasia. There is a 50% stenosis in the mid LCx.  Angiographic Findings:  Left main: No obstructive disease.   Left Anterior Descending Artery:Large caliber vessel that courses to the apex with diffuse 20-30% stenosis in the proximal, mid and distal vessel. The first diagonal is occluded proximally. The second diagonal is a very small caliber vessel with ostial 70% stenosis.   Circumflex Artery: Large caliber vessel with diffuse ectasia giving rise to 2 obtuse marginal branches. The first OM branch has a 30% stenosis. The mid Circumflex has a 50% stenosis.   Right Coronary Artery: Large caliber dominant vessel with 99% sub-total occlusion proximally with appearance of thrombus. The mid vessel has a 40% stenosis. The distal vessel has a stented segment with 80% proximal edge restenosis as well as a 80% stenosis just proximal to the stented segment.   Left Ventricular Angiogram: 50%, no MR  Impression: 1. Severe single vessel CAD 2. Severe stenosis proximal RCA and distal RCA 3. Preserved LV systolic function 4. NSTEMI (initial ST elevation while in EMS but chest pain resolved before arrival to ED and EKG normalized) 5. Successful PTCA/DES x 1 proximal RCA 6. Successful PTCA/DES x 1 distal RCA  Recommendations: He will need Brilinta 90 mg po BID for at least one year. Will get him into the assistance program. Continue ASA and statin. Start beta blocker if HR allows. Angiomax for 2 hours at reduced rate then d/c.    Complications: None. The patient tolerated the procedure well.     Assessment / Plan: 1. CAD - prior NSTEMI with past PCIs and now with most recent STEMI with PCI x 2 to the RCA - doing well clinically. LV function was preserved. Continue with DAPT with Brilinta. Doing well clinically. See Dr. Tresa Endo in 2 months.  2. Medical noncompliance - Seems  motivated to make changes. Reminded again that he would have more money if not smoking (all agents).  3. HTN - BP looks fine.  4. HLD - Now on high dose Lipitor - recheck his labs today  5. Tobacco abuse - seems motivated - down to 3 cigs/day.  Current medicines are reviewed with the patient today.  The patient does not have concerns regarding medicines other than what has been noted above.  The following changes have been made:  See above.  Labs/ tests ordered today include:    Orders Placed This Encounter  Procedures  . Basic metabolic panel  . Hepatic function panel  . Lipid panel    Disposition:   FU with Dr. Tresa Endo in 2 months  Patient is agreeable to this plan and will call if any problems develop in the interim.   Signed: Rosalio Macadamia, RN, ANP-C 02/20/2014 9:37 AM  [facility] [address] [place], [place]  [address] Phone: [phone] Fax: [phone]

## 2014-02-27 ENCOUNTER — Other Ambulatory Visit: Payer: Self-pay

## 2014-04-17 ENCOUNTER — Ambulatory Visit: Payer: Self-pay | Admitting: Cardiovascular Disease

## 2014-07-05 ENCOUNTER — Emergency Department (HOSPITAL_COMMUNITY)
Admission: EM | Admit: 2014-07-05 | Discharge: 2014-07-05 | Disposition: A | Discharge status: Home / Self Care | Payer: Self-pay | Attending: Emergency Medicine | Admitting: Emergency Medicine

## 2014-07-05 ENCOUNTER — Encounter (HOSPITAL_COMMUNITY): Payer: Self-pay | Admitting: *Deleted

## 2014-07-05 DIAGNOSIS — Z720 Tobacco use: Secondary | ICD-10-CM | POA: Insufficient documentation

## 2014-07-05 DIAGNOSIS — Z79899 Other long term (current) drug therapy: Secondary | ICD-10-CM | POA: Insufficient documentation

## 2014-07-05 DIAGNOSIS — I2510 Atherosclerotic heart disease of native coronary artery without angina pectoris: Secondary | ICD-10-CM | POA: Insufficient documentation

## 2014-07-05 DIAGNOSIS — Z8659 Personal history of other mental and behavioral disorders: Secondary | ICD-10-CM | POA: Insufficient documentation

## 2014-07-05 DIAGNOSIS — Z9861 Coronary angioplasty status: Secondary | ICD-10-CM | POA: Insufficient documentation

## 2014-07-05 DIAGNOSIS — E785 Hyperlipidemia, unspecified: Secondary | ICD-10-CM | POA: Insufficient documentation

## 2014-07-05 DIAGNOSIS — I10 Essential (primary) hypertension: Secondary | ICD-10-CM | POA: Insufficient documentation

## 2014-07-05 DIAGNOSIS — R112 Nausea with vomiting, unspecified: Secondary | ICD-10-CM | POA: Insufficient documentation

## 2014-07-05 DIAGNOSIS — Z7982 Long term (current) use of aspirin: Secondary | ICD-10-CM | POA: Insufficient documentation

## 2014-07-05 DIAGNOSIS — Z9119 Patient's noncompliance with other medical treatment and regimen: Secondary | ICD-10-CM | POA: Insufficient documentation

## 2014-07-05 LAB — COMPREHENSIVE METABOLIC PANEL
ALBUMIN: 3.4 g/dL — AB (ref 3.5–5.0)
ALT: 29 U/L (ref 17–63)
AST: 21 U/L (ref 15–41)
Alkaline Phosphatase: 61 U/L (ref 38–126)
Anion gap: 10 (ref 5–15)
BUN: 11 mg/dL (ref 6–20)
CALCIUM: 8.5 mg/dL — AB (ref 8.9–10.3)
CO2: 26 mmol/L (ref 22–32)
CREATININE: 1.13 mg/dL (ref 0.61–1.24)
Chloride: 103 mmol/L (ref 101–111)
GFR calc non Af Amer: >60 mL/min (ref >60)
GLUCOSE: 104 mg/dL — AB (ref 65–99)
Potassium: 3.7 mmol/L (ref 3.5–5.1)
Sodium: 139 mmol/L (ref 135–145)
TOTAL PROTEIN: 6.3 g/dL — AB (ref 6.5–8.1)
Total Bilirubin: 0.9 mg/dL (ref 0.3–1.2)

## 2014-07-05 LAB — CBC WITH DIFFERENTIAL/PLATELET
BASOS PCT: 0 % (ref 0–1)
Basophils Absolute: 0 10*3/uL (ref 0.0–0.1)
EOS PCT: 0 % (ref 0–5)
Eosinophils Absolute: 0 10*3/uL (ref 0.0–0.7)
HCT: 47.8 % (ref 39.0–52.0)
Hemoglobin: 16.5 g/dL (ref 13.0–17.0)
LYMPHS PCT: 22 % (ref 12–46)
Lymphs Abs: 1.3 10*3/uL (ref 0.7–4.0)
MCH: 31.2 pg (ref 26.0–34.0)
MCHC: 34.5 g/dL (ref 30.0–36.0)
MCV: 90.4 fL (ref 78.0–100.0)
MONO ABS: 0.4 10*3/uL (ref 0.1–1.0)
Monocytes Relative: 6 % (ref 3–12)
Neutro Abs: 4.3 10*3/uL (ref 1.7–7.7)
Neutrophils Relative %: 72 % (ref 43–77)
PLATELETS: 342 10*3/uL (ref 150–400)
RBC: 5.29 MIL/uL (ref 4.22–5.81)
RDW: 15.3 % (ref 11.5–15.5)
WBC: 5.9 10*3/uL (ref 4.0–10.5)

## 2014-07-05 LAB — URINALYSIS, ROUTINE W REFLEX MICROSCOPIC
Glucose, UA: NEGATIVE mg/dL
Hgb urine dipstick: NEGATIVE
Ketones, ur: 40 mg/dL — AB
NITRITE: NEGATIVE
Protein, ur: 100 mg/dL — AB
SPECIFIC GRAVITY, URINE: 1.041 — AB (ref 1.005–1.030)
UROBILINOGEN UA: 1 mg/dL (ref 0.0–1.0)
pH: 7.5 (ref 5.0–8.0)

## 2014-07-05 LAB — CK: Total CK: 174 U/L (ref 49–397)

## 2014-07-05 LAB — URINE MICROSCOPIC-ADD ON

## 2014-07-05 LAB — I-STAT TROPONIN, ED: TROPONIN I, POC: 0 ng/mL (ref 0.00–0.08)

## 2014-07-05 LAB — LIPASE, BLOOD: Lipase: 12 U/L — ABNORMAL LOW (ref 22–51)

## 2014-07-05 MED ORDER — ONDANSETRON HCL 4 MG/2ML IJ SOLN
4.0000 mg | Freq: Once | INTRAMUSCULAR | Status: AC
  Administered 2014-07-05: 4 mg via INTRAVENOUS
  Filled 2014-07-05: qty 2

## 2014-07-05 MED ORDER — ONDANSETRON 4 MG PO TBDP: 4.0000 mg | ORAL_TABLET | Freq: Three times a day (TID) | ORAL | Status: DC | PRN

## 2014-07-05 MED ORDER — PROMETHAZINE HCL 25 MG PO TABS: 25.0000 mg | ORAL_TABLET | Freq: Four times a day (QID) | ORAL | Status: DC | PRN

## 2014-07-05 MED ORDER — SODIUM CHLORIDE 0.9 % IV BOLUS (SEPSIS)
2000.0000 mL | Freq: Once | INTRAVENOUS | Status: AC
  Administered 2014-07-05: 2000 mL via INTRAVENOUS

## 2014-07-05 NOTE — ED Notes (Signed)
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 Can't keep any food or fluids down.

## 2014-07-05 NOTE — ED Notes (Signed)
Patient is aware we need urine- will let us know when he is able [place]

## 2014-07-05 NOTE — Discharge Instructions (Signed)

## 2014-07-05 NOTE — ED Notes (Signed)
Bed: AY04 Expected date:  Expected time:  Means of arrival:  Comments: EMS 38yo M N/V x 2 days

## 2014-07-05 NOTE — ED Provider Notes (Signed)
TIME SEEN: 7:05 AM  CHIEF COMPLAINT: Nausea and vomiting  HPI: Pt is a 39 y.o. male with history of bipolar disorder, hypertension, hyperlipidemia, coronary artery disease and ischemic cardiomyopathy, substance abuse who presents to the emergency department with 2 days of nausea, vomiting. 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 States he is not able to keep anything down and has lost weight. Denies diarrhea. States he will have some abdominal cramping but only with vomiting. No current pain. No chest pain or shortness of breath. Has had subjective fevers. No known sick contacts or recent travel.  ROS: See HPI Constitutional: no fever  Eyes: no drainage  ENT: no runny nose   Cardiovascular:  no chest pain  Resp: no SOB  GI:  vomiting GU: no dysuria Integumentary: no rash  Allergy: no hives  Musculoskeletal: no leg swelling  Neurological: no slurred speech ROS otherwise negative  PAST MEDICAL HISTORY/PAST SURGICAL HISTORY:  Past Medical History  Diagnosis Date  . Bipolar 1 disorder   . Hypertension   . Hyperlipidemia   . Hx of medication noncompliance   . Polysubstance abuse     a. h/o tobacco, marijuana and crack cocaine use. b. 07/2013: +UDS THC, neg for cocaine.  . Anxiety   . CAD S/P percutaneous coronary angioplasty     a. inferior STEMI s/p BMS to RCA 01/2007. b. NSTEMI 07/2013 s/p PTCA to RCA for restenosis (no stenting due to hx noncompliance). c. Transient inferior ST elevation (peak troponin 0.25) 01/2014 s/p PTCA/DES to prox RCA, PTCA/DES to distal RCA, EF 50%.  . NSVT (nonsustained ventricular tachycardia)     a. Very brief run during 07/2013 admit for NSTEMI felt due to MI.  . Ischemic cardiomyopathy     a. EF 40% in 2011, 60% in 2012. b. EF 55% by cath 07/2013. c. EF 50% by cath 01/2014.  . Tobacco abuse     MEDICATIONS:  Prior to Admission  medications   Medication Sig Start Date End Date Taking? Authorizing Provider  aspirin EC 81 MG tablet Take 81 mg by mouth daily.    Historical Provider, MD  atorvastatin (LIPITOR) 80 MG tablet Take 0.5 tablets (40 mg total) by mouth every evening. 02/20/14   Rosalio Macadamia, NP  DimenhyDRINATE (DRAMAMINE PO) Take 1 tablet by mouth daily as needed (motion sickness).    Historical Provider, MD  lisinopril (PRINIVIL,ZESTRIL) 5 MG tablet Take 1 tablet (5 mg total) by mouth daily. 02/20/14   Rosalio Macadamia, NP  nitroGLYCERIN (NITROSTAT) 0.4 MG SL tablet Place 1 tablet (0.4 mg total) under the tongue every 5 (five) minutes as needed for chest pain (up to 3 doses). 01/15/14   Dayna N Dunn, PA-C  ticagrelor (BRILINTA) 90 MG TABS tablet Take 1 tablet (90 mg total) by mouth 2 (two) times daily. 01/15/14   Dayna N Dunn, PA-C    ALLERGIES:  Allergies  Allergen Reactions  . Iodine Shortness Of Breath and Swelling  . Shellfish Allergy Anaphylaxis    All shellfish  . Contrast Media [Iodinated Diagnostic Agents] Nausea And Vomiting    SOCIAL HISTORY:  History  Substance Use Topics  . Smoking status: Current Every Day Smoker -- 0.50 packs/day    Types: Cigarettes  . Smokeless tobacco: Not on file  . Alcohol Use: Yes    FAMILY HISTORY: Family History  Problem Relation Age  of Onset  . CAD Mother   . Heart disease Mother   . Heart attack Mother   . CAD Sister     EXAM: BP 136/84 mmHg  Pulse 50  Temp(Src) 98.5 F (36.9 C) (Oral)  Resp 18  SpO2 98% CONSTITUTIONAL: Alert and oriented and responds appropriately to questions. Thin, appears mildly dehydrated but nontoxic, afebrile HEAD: Normocephalic EYES: Conjunctivae clear, PERRL ENT: normal nose; no rhinorrhea; dry mucous membranes; pharynx without lesions noted NECK: Supple, no meningismus, no LAD  CARD: RRR; S1 and S2 appreciated; no murmurs, no clicks, no rubs, no gallops RESP: Normal chest excursion without splinting or tachypnea; breath  sounds clear and equal bilaterally; no wheezes, no rhonchi, no rales, no hypoxia or respiratory distress, speaking full sentences ABD/GI: Normal bowel sounds; non-distended; soft, non-tender, no rebound, no guarding, no peritoneal signs BACK:  The back appears normal and is non-tender to palpation, there is no CVA tenderness EXT: Normal ROM in all joints; non-tender to palpation; no edema; normal capillary refill; no cyanosis, no calf tenderness or swelling    SKIN: Normal color for age and race; warm NEURO: Moves all extremities equally, sensation to light touch intact diffusely, cranial nerves II through XII intact PSYCH: The patient's mood and manner are appropriate. Grooming and personal hygiene are appropriate.  MEDICAL DECISION MAKING: Patient here with 2 days of nausea, vomiting. States he has been outside working in the heat for the past 2 days and he thinks this is what is contributing to his symptoms. Will check labs, urine. Will give IV fluids, Zofran. His abdominal exam is benign.  ED PROGRESS: 9:15 AM  Pt's labs are unremarkable. Negative troponin. Normal CK. Electrolytes within normal limits. No leukocytosis. Reports feeling much better after 4 mg IV Zofran. We'll by mouth challenge.  10:00 AM   Pt able to drink a full glass of water without further vomiting. Reports he is still feeling better but is not having some return of his nausea. We'll give second dose of Zofran. Urinalysis is currently pending.   10:10 AM  Pt's urine shows ketones and protein but this appears to be chronic for patient. He rcvd 2L IVF in ED.  Trace leukocytes but no other sign of infection. I feel he is safe to be discharged home. We'll discharge with work note and prescription for Zofran. Discussed return precautions. He verbalizes understanding and is comfortable with plan. His repeat abdominal exam is still benign.   EKG Interpretation  Date/Time:  Friday July 05 2014 07:30:22 EDT Ventricular Rate:   54 PR Interval:  137 QRS Duration: 121 QT Interval:  450 QTC Calculation: 426 R Axis:   71 Text Interpretation:  Sinus rhythm Nonspecific intraventricular conduction delay ST elev, probable normal early repol pattern No significant change since 2002 Confirmed by Ashantae Pangallo,  DO, Afsana Liera [phone]) on 07/05/2014 7:35:58 AM        Layla Maw Zoie Sarin, DO 07/05/14 1011

## 2014-08-09 ENCOUNTER — Encounter (HOSPITAL_COMMUNITY): Payer: Self-pay

## 2014-08-09 ENCOUNTER — Emergency Department (HOSPITAL_COMMUNITY)
Admission: EM | Admit: 2014-08-09 | Discharge: 2014-08-09 | Disposition: A | Discharge status: Home / Self Care | Payer: Self-pay | Attending: Emergency Medicine | Admitting: Emergency Medicine

## 2014-08-09 DIAGNOSIS — Z7982 Long term (current) use of aspirin: Secondary | ICD-10-CM | POA: Insufficient documentation

## 2014-08-09 DIAGNOSIS — Z79899 Other long term (current) drug therapy: Secondary | ICD-10-CM | POA: Insufficient documentation

## 2014-08-09 DIAGNOSIS — Z720 Tobacco use: Secondary | ICD-10-CM | POA: Insufficient documentation

## 2014-08-09 DIAGNOSIS — Z8659 Personal history of other mental and behavioral disorders: Secondary | ICD-10-CM | POA: Insufficient documentation

## 2014-08-09 DIAGNOSIS — Z9889 Other specified postprocedural states: Secondary | ICD-10-CM | POA: Insufficient documentation

## 2014-08-09 DIAGNOSIS — R1013 Epigastric pain: Secondary | ICD-10-CM | POA: Insufficient documentation

## 2014-08-09 DIAGNOSIS — I2510 Atherosclerotic heart disease of native coronary artery without angina pectoris: Secondary | ICD-10-CM | POA: Insufficient documentation

## 2014-08-09 DIAGNOSIS — Z9861 Coronary angioplasty status: Secondary | ICD-10-CM | POA: Insufficient documentation

## 2014-08-09 DIAGNOSIS — I10 Essential (primary) hypertension: Secondary | ICD-10-CM | POA: Insufficient documentation

## 2014-08-09 DIAGNOSIS — R112 Nausea with vomiting, unspecified: Secondary | ICD-10-CM | POA: Insufficient documentation

## 2014-08-09 DIAGNOSIS — Z8639 Personal history of other endocrine, nutritional and metabolic disease: Secondary | ICD-10-CM | POA: Insufficient documentation

## 2014-08-09 LAB — COMPREHENSIVE METABOLIC PANEL
ALT: 21 U/L (ref 17–63)
AST: 21 U/L (ref 15–41)
Albumin: 3.2 g/dL — ABNORMAL LOW (ref 3.5–5.0)
Alkaline Phosphatase: 50 U/L (ref 38–126)
Anion gap: 8 (ref 5–15)
BUN: 5 mg/dL — AB (ref 6–20)
CO2: 28 mmol/L (ref 22–32)
Calcium: 8.9 mg/dL (ref 8.9–10.3)
Chloride: 101 mmol/L (ref 101–111)
Creatinine, Ser: 1.28 mg/dL — ABNORMAL HIGH (ref 0.61–1.24)
GFR calc Af Amer: >60 mL/min (ref >60)
GFR calc non Af Amer: >60 mL/min (ref >60)
Glucose, Bld: 94 mg/dL (ref 65–99)
POTASSIUM: 3.6 mmol/L (ref 3.5–5.1)
SODIUM: 137 mmol/L (ref 135–145)
TOTAL PROTEIN: 5.3 g/dL — AB (ref 6.5–8.1)
Total Bilirubin: 0.7 mg/dL (ref 0.3–1.2)

## 2014-08-09 LAB — CBC WITH DIFFERENTIAL/PLATELET
BASOS ABS: 0 10*3/uL (ref 0.0–0.1)
Basophils Relative: 0 % (ref 0–1)
Eosinophils Absolute: 0 10*3/uL (ref 0.0–0.7)
Eosinophils Relative: 1 % (ref 0–5)
HEMATOCRIT: 43.3 % (ref 39.0–52.0)
Hemoglobin: 15.3 g/dL (ref 13.0–17.0)
LYMPHS PCT: 37 % (ref 12–46)
Lymphs Abs: 1.7 10*3/uL (ref 0.7–4.0)
MCH: 31.4 pg (ref 26.0–34.0)
MCHC: 35.3 g/dL (ref 30.0–36.0)
MCV: 88.9 fL (ref 78.0–100.0)
MONO ABS: 0.3 10*3/uL (ref 0.1–1.0)
MONOS PCT: 6 % (ref 3–12)
NEUTROS PCT: 56 % (ref 43–77)
Neutro Abs: 2.6 10*3/uL (ref 1.7–7.7)
PLATELETS: 268 10*3/uL (ref 150–400)
RBC: 4.87 MIL/uL (ref 4.22–5.81)
RDW: 15.3 % (ref 11.5–15.5)
WBC: 4.6 10*3/uL (ref 4.0–10.5)

## 2014-08-09 LAB — URINALYSIS, ROUTINE W REFLEX MICROSCOPIC
GLUCOSE, UA: NEGATIVE mg/dL
Hgb urine dipstick: NEGATIVE
KETONES UR: 15 mg/dL — AB
Leukocytes, UA: NEGATIVE
Nitrite: NEGATIVE
PH: 5.5 (ref 5.0–8.0)
Protein, ur: NEGATIVE mg/dL
SPECIFIC GRAVITY, URINE: 1.017 (ref 1.005–1.030)
Urobilinogen, UA: 1 mg/dL (ref 0.0–1.0)

## 2014-08-09 LAB — I-STAT TROPONIN, ED: TROPONIN I, POC: 0 ng/mL (ref 0.00–0.08)

## 2014-08-09 LAB — LIPASE, BLOOD: Lipase: 21 U/L — ABNORMAL LOW (ref 22–51)

## 2014-08-09 MED ORDER — ONDANSETRON 4 MG PO TBDP: 4.0000 mg | ORAL_TABLET | Freq: Three times a day (TID) | ORAL | Status: DC | PRN

## 2014-08-09 MED ORDER — SODIUM CHLORIDE 0.9 % IV BOLUS (SEPSIS)
1000.0000 mL | Freq: Once | INTRAVENOUS | Status: AC
  Administered 2014-08-09: 1000 mL via INTRAVENOUS

## 2014-08-09 MED ORDER — ONDANSETRON HCL 4 MG/2ML IJ SOLN
4.0000 mg | Freq: Once | INTRAMUSCULAR | Status: AC
  Administered 2014-08-09: 4 mg via INTRAVENOUS
  Filled 2014-08-09: qty 2

## 2014-08-09 NOTE — ED Notes (Signed)
PA at the bedside.

## 2014-08-09 NOTE — ED Provider Notes (Signed)
CSN: [id number]     Arrival date & time 08/09/14  1048 History   First MD Initiated Contact with Patient 08/09/14 1057     Chief Complaint  Patient presents with  . Emesis     (Consider location/radiation/quality/duration/timing/severity/associated sxs/prior Treatment) The history is provided by the patient and medical records.   This is a 39 year old male with history of hypertension, hyperlipidemia, bipolar disorder, anxiety, coronary artery disease, presenting to the ED for nausea and vomiting for the past 4 days.  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 He states throughout the day he has multiple episodes of nonbloody, nonbilious emesis. He states he is able to eat and drink normally, however he eventually ends up vomiting it up.  States he does have some peri-umbilical/epigastric pain associated with vomiting.  He denies any fever or chills. He denies any chest pain or shortness of breath. Patient does have known cardiac history. He has not seen his cardiologist in several years. Patient has been taking over-the-counter Dramamine for his nausea which was helping initially but since stopped working.  No prior abdominal surgeries.  VSS.  Past Medical History  Diagnosis Date  . Bipolar 1 disorder   . Hypertension   . Hyperlipidemia   . Hx of medication noncompliance   . Polysubstance abuse     a. h/o tobacco, marijuana and crack cocaine use. b. 07/2013: +UDS THC, neg for cocaine.  . Anxiety   . CAD S/P percutaneous coronary angioplasty     a. inferior STEMI s/p BMS to RCA 01/2007. b. NSTEMI 07/2013 s/p PTCA to RCA for restenosis (no stenting due to hx noncompliance). c. Transient inferior ST elevation (peak troponin 0.25) 01/2014 s/p PTCA/DES to prox RCA, PTCA/DES to distal RCA, EF 50%.  . NSVT (nonsustained ventricular tachycardia)     a. Very brief run during 07/2013 admit for NSTEMI felt due to MI.  . Ischemic cardiomyopathy     a. EF 40% in 2011, 60% in 2012.  b. EF 55% by cath 07/2013. c. EF 50% by cath 01/2014.  . Tobacco abuse    Past Surgical History  Procedure Laterality Date  . Femoral artery stent    . Coronary angioplasty with stent placement  02/05/2007    3.5x64mm Quantum non-DES to RCA (Dr. Nicki Guadalajara)  . Cardiac catheterization  01/16/2009    normal left main, Cfx with 2-OMs both w/minor irregularities, LAd with 20-30% mid region irregularities, ramus intermediate/optional diagonal with 60% osital narrowing, RCA with stent in distal portion w/20% prox in-stent stenosis (Dr. Mervyn Skeeters. [place])  . Cardiac catheterization  07/23/2013    two vessel obstructive CAD, occluded first diagonal, focal in-stent restenosis in distal RCA (Dr. Peter Swaziland)  . Left heart catheterization with coronary angiogram N/A 07/23/2013    Procedure: LEFT HEART CATHETERIZATION WITH CORONARY ANGIOGRAM;  Surgeon: Peter M Swaziland, MD;  Location: [facility] CATH LAB;  Service: Cardiovascular;  Laterality: N/A;  . Left and right heart catheterization with coronary angiogram N/A 01/14/2014    Procedure: LEFT AND RIGHT HEART CATHETERIZATION WITH CORONARY ANGIOGRAM;  Surgeon: Kathleene Hazel, MD;  Location: [facility] CATH LAB;  Service: Cardiovascular;  Laterality: N/A;   Family History  Problem Relation Age of Onset  . CAD Mother   . Heart disease Mother   . Heart attack Mother   . CAD Sister    History  Substance Use Topics  . Smoking status: Current Every Day Smoker -- 0.25 packs/day    Types: Cigarettes  .  Smokeless tobacco: Not on file  . Alcohol Use: Yes    Review of Systems  Gastrointestinal: Positive for nausea, vomiting and abdominal pain.  All other systems reviewed and are negative.     Allergies  Iodine; Shellfish allergy; and Contrast media  Home Medications   Prior to Admission medications   Medication Sig Start Date End Date Taking? Authorizing Provider  aspirin EC 81 MG tablet Take 81 mg by mouth daily.    Historical Provider, MD  atorvastatin (LIPITOR)  80 MG tablet Take 0.5 tablets (40 mg total) by mouth every evening. Patient not taking: Reported on 07/05/2014 02/20/14   Rosalio Macadamia, NP  DimenhyDRINATE (MOTION SICKNESS PO) Take 1 tablet by mouth daily.    Historical Provider, MD  lisinopril (PRINIVIL,ZESTRIL) 5 MG tablet Take 1 tablet (5 mg total) by mouth daily. Patient not taking: Reported on 07/05/2014 02/20/14   Rosalio Macadamia, NP  nitroGLYCERIN (NITROSTAT) 0.4 MG SL tablet Place 1 tablet (0.4 mg total) under the tongue every 5 (five) minutes as needed for chest pain (up to 3 doses). 01/15/14   Dayna N Dunn, PA-C  ondansetron (ZOFRAN ODT) 4 MG disintegrating tablet Take 1 tablet (4 mg total) by mouth every 8 (eight) hours as needed for nausea or vomiting. 07/05/14   Kristen N Ward, DO  promethazine (PHENERGAN) 25 MG tablet Take 1 tablet (25 mg total) by mouth every 6 (six) hours as needed for nausea or vomiting. 07/05/14   Layla Maw Ward, DO  ticagrelor (BRILINTA) 90 MG TABS tablet Take 1 tablet (90 mg total) by mouth 2 (two) times daily. 01/15/14   Dayna N Dunn, PA-C   BP 146/100 mmHg  Pulse 56  Temp(Src) 97.5 F (36.4 C) (Oral)  Resp 19  Ht 6' (1.829 m)  Wt 135 lb (61.236 kg)  BMI 18.31 kg/m2  SpO2 100%   Physical Exam  Constitutional: He is oriented to person, place, and time. He appears well-developed and well-nourished. No distress.  HENT:  Head: Normocephalic and atraumatic.  Mouth/Throat: Oropharynx is clear and moist.  Eyes: Conjunctivae and EOM are normal. Pupils are equal, round, and reactive to light.  Neck: Normal range of motion. Neck supple.  Cardiovascular: Normal rate, regular rhythm and normal heart sounds.   Pulmonary/Chest: Effort normal and breath sounds normal. No respiratory distress. He has no wheezes.  Abdominal: Soft. Bowel sounds are normal. There is no tenderness. There is no guarding.  Musculoskeletal: Normal range of motion. He exhibits no edema.  Neurological: He is alert and oriented to person, place,  and time.  Skin: Skin is warm and dry. He is not diaphoretic.  Psychiatric: He has a normal mood and affect.  Nursing note and vitals reviewed.   ED Course  Procedures (including critical care time) Labs Review Labs Reviewed  COMPREHENSIVE METABOLIC PANEL - Abnormal; Notable for the following:    BUN 5 (*)    Creatinine, Ser 1.28 (*)    Total Protein 5.3 (*)    Albumin 3.2 (*)    All other components within normal limits  LIPASE, BLOOD - Abnormal; Notable for the following:    Lipase 21 (*)    All other components within normal limits  URINALYSIS, ROUTINE W REFLEX MICROSCOPIC (NOT AT [facility]) - Abnormal; Notable for the following:    Bilirubin Urine SMALL (*)    Ketones, ur 15 (*)    All other components within normal limits  CBC WITH DIFFERENTIAL/PLATELET  Rosezena Sensor, ED  Imaging Review No results found.   EKG Interpretation   Date/Time:  Friday August 09 2014 11:20:18 EDT Ventricular Rate:  60 PR Interval:  128 QRS Duration: 120 QT Interval:  430 QTC Calculation: 430 R Axis:   45 Text Interpretation:  Sinus rhythm IVCD, consider atypical RBBB ST elev,  probable normal early repol pattern since last tracing no significant  change Confirmed by [facility]  MD, ELLIOTT ([id number]) on 08/09/2014 11:45:55 AM      MDM   Final diagnoses:  Nausea and vomiting, vomiting of unspecified type   39 year old male here with nausea and vomiting over the past 4 days. Unrelieved by over-the-counter Dramamine. Patient is afebrile, nontoxic. His abdominal exam is benign. Lab work is reassuring. EKG without any acute ischemic changes. Patient was treated with IV fluids and Zofran with improvement of his symptoms. He has tolerated fluids and sandwich here in the ED without difficulty.  No active vomiting in ED.  patient states he is feeling better. Abdominal exam remains benign. Remains without any chest pain or SOB.  Given negative work-up doubt atypical ACS, PE, dissection, or other acute  cardiac event at this time.  Also doubt acute abdominal or surgical pathology. Will discharge home with supportive care.  Discussed plan with patient, he/she acknowledged understanding and agreed with plan of care.  Return precautions given for new or worsening symptoms.  Garlon Hatchet, PA-C 08/09/14 1419  Mancel Bale, MD 08/13/14 [phone]

## 2014-08-09 NOTE — ED Notes (Addendum)
Patient reports being able to keep down food and drink with slight nausea. "Much better than I was before." PA made aware.

## 2014-08-09 NOTE — Discharge Instructions (Signed)
Take the prescribed medication as directed. Follow-up with your primary care physician.  If you do not have one, you may be seen at the wellness clinic. Return to the ED for new or worsening symptoms.

## 2014-08-09 NOTE — ED Notes (Signed)
Per Patient, Patient has started a new job. He states that for the past four days, He will go to work and about thirty minutes after starting he starts to have dry heaving and by the end of the shift he will vomit 16-17 times. Patient is alert and oriented x4. Reports abdomen pain. Patient has a hx of 3 stents and multiple MIs. Patient denies dizziness and chest pain.

## 2014-08-09 NOTE — ED Notes (Signed)
Patient given something to eat and drink at this time. 

## 2015-01-10 ENCOUNTER — Encounter (HOSPITAL_COMMUNITY)
Admission: EM | Disposition: A | Discharge status: Home / Self Care | Payer: Self-pay | Source: Home / Self Care | Attending: Cardiovascular Disease

## 2015-01-10 ENCOUNTER — Inpatient Hospital Stay (HOSPITAL_COMMUNITY)
Admission: EM | Admit: 2015-01-10 | Discharge: 2015-01-11 | DRG: 247 | Disposition: A | Discharge status: Home / Self Care | Payer: Self-pay | Attending: Cardiovascular Disease | Admitting: Cardiovascular Disease

## 2015-01-10 ENCOUNTER — Encounter (HOSPITAL_COMMUNITY): Payer: Self-pay | Admitting: Student

## 2015-01-10 DIAGNOSIS — I252 Old myocardial infarction: Secondary | ICD-10-CM

## 2015-01-10 DIAGNOSIS — Z91199 Patient's noncompliance with other medical treatment and regimen due to unspecified reason: Secondary | ICD-10-CM | POA: Insufficient documentation

## 2015-01-10 DIAGNOSIS — F1290 Cannabis use, unspecified, uncomplicated: Secondary | ICD-10-CM | POA: Diagnosis present

## 2015-01-10 DIAGNOSIS — Z79899 Other long term (current) drug therapy: Secondary | ICD-10-CM

## 2015-01-10 DIAGNOSIS — Z23 Encounter for immunization: Secondary | ICD-10-CM

## 2015-01-10 DIAGNOSIS — I119 Hypertensive heart disease without heart failure: Secondary | ICD-10-CM

## 2015-01-10 DIAGNOSIS — Z9114 Patient's other noncompliance with medication regimen: Secondary | ICD-10-CM

## 2015-01-10 DIAGNOSIS — I2119 ST elevation (STEMI) myocardial infarction involving other coronary artery of inferior wall: Principal | ICD-10-CM | POA: Diagnosis present

## 2015-01-10 DIAGNOSIS — Z9861 Coronary angioplasty status: Secondary | ICD-10-CM

## 2015-01-10 DIAGNOSIS — I255 Ischemic cardiomyopathy: Secondary | ICD-10-CM | POA: Diagnosis present

## 2015-01-10 DIAGNOSIS — Y848 Other medical procedures as the cause of abnormal reaction of the patient, or of later complication, without mention of misadventure at the time of the procedure: Secondary | ICD-10-CM | POA: Diagnosis present

## 2015-01-10 DIAGNOSIS — I2510 Atherosclerotic heart disease of native coronary artery without angina pectoris: Secondary | ICD-10-CM

## 2015-01-10 DIAGNOSIS — F17210 Nicotine dependence, cigarettes, uncomplicated: Secondary | ICD-10-CM | POA: Diagnosis present

## 2015-01-10 DIAGNOSIS — I10 Essential (primary) hypertension: Secondary | ICD-10-CM

## 2015-01-10 DIAGNOSIS — I2111 ST elevation (STEMI) myocardial infarction involving right coronary artery: Secondary | ICD-10-CM

## 2015-01-10 DIAGNOSIS — T82867A Thrombosis of cardiac prosthetic devices, implants and grafts, initial encounter: Secondary | ICD-10-CM | POA: Diagnosis present

## 2015-01-10 DIAGNOSIS — Z9119 Patient's noncompliance with other medical treatment and regimen: Secondary | ICD-10-CM

## 2015-01-10 DIAGNOSIS — Z955 Presence of coronary angioplasty implant and graft: Secondary | ICD-10-CM

## 2015-01-10 DIAGNOSIS — Z7982 Long term (current) use of aspirin: Secondary | ICD-10-CM

## 2015-01-10 DIAGNOSIS — Z91148 Patient's other noncompliance with medication regimen for other reason: Secondary | ICD-10-CM

## 2015-01-10 DIAGNOSIS — E785 Hyperlipidemia, unspecified: Secondary | ICD-10-CM | POA: Diagnosis present

## 2015-01-10 DIAGNOSIS — Z590 Homelessness: Secondary | ICD-10-CM

## 2015-01-10 DIAGNOSIS — Z720 Tobacco use: Secondary | ICD-10-CM | POA: Diagnosis present

## 2015-01-10 DIAGNOSIS — Z91041 Radiographic dye allergy status: Secondary | ICD-10-CM

## 2015-01-10 DIAGNOSIS — Z95820 Peripheral vascular angioplasty status with implants and grafts: Secondary | ICD-10-CM

## 2015-01-10 HISTORY — PX: CARDIAC CATHETERIZATION: SHX172

## 2015-01-10 LAB — CBC
HCT: 39.2 % (ref 39.0–52.0)
Hemoglobin: 13.7 g/dL (ref 13.0–17.0)
MCH: 31.3 pg (ref 26.0–34.0)
MCHC: 34.9 g/dL (ref 30.0–36.0)
MCV: 89.5 fL (ref 78.0–100.0)
Platelets: 220 10*3/uL (ref 150–400)
RBC: 4.38 MIL/uL (ref 4.22–5.81)
RDW: 15.6 % — AB (ref 11.5–15.5)
WBC: 4.9 10*3/uL (ref 4.0–10.5)

## 2015-01-10 LAB — DIFFERENTIAL
BASOS ABS: 0 10*3/uL (ref 0.0–0.1)
BASOS PCT: 0 %
EOS ABS: 0 10*3/uL (ref 0.0–0.7)
Eosinophils Relative: 0 %
Lymphocytes Relative: 25 %
Lymphs Abs: 1.2 10*3/uL (ref 0.7–4.0)
MONOS PCT: 5 %
Monocytes Absolute: 0.2 10*3/uL (ref 0.1–1.0)
NEUTROS PCT: 70 %
Neutro Abs: 3.4 10*3/uL (ref 1.7–7.7)

## 2015-01-10 LAB — POCT I-STAT, CHEM 8
BUN: 4 mg/dL — AB (ref 6–20)
CREATININE: 1 mg/dL (ref 0.61–1.24)
Calcium, Ion: 1.11 mmol/L — ABNORMAL LOW (ref 1.12–1.23)
Chloride: 101 mmol/L (ref 101–111)
Glucose, Bld: 133 mg/dL — ABNORMAL HIGH (ref 65–99)
HEMATOCRIT: 50 % (ref 39.0–52.0)
HEMOGLOBIN: 17 g/dL (ref 13.0–17.0)
POTASSIUM: 3.4 mmol/L — AB (ref 3.5–5.1)
SODIUM: 137 mmol/L (ref 135–145)
TCO2: 21 mmol/L (ref 0–100)

## 2015-01-10 LAB — COMPREHENSIVE METABOLIC PANEL
ALBUMIN: 3.2 g/dL — AB (ref 3.5–5.0)
ALK PHOS: 42 U/L (ref 38–126)
ALT: 20 U/L (ref 17–63)
AST: 65 U/L — AB (ref 15–41)
Anion gap: 9 (ref 5–15)
BILIRUBIN TOTAL: 0.4 mg/dL (ref 0.3–1.2)
CALCIUM: 8.5 mg/dL — AB (ref 8.9–10.3)
CO2: 26 mmol/L (ref 22–32)
CREATININE: 1.19 mg/dL (ref 0.61–1.24)
Chloride: 101 mmol/L (ref 101–111)
GFR calc Af Amer: >60 mL/min (ref >60)
GFR calc non Af Amer: >60 mL/min (ref >60)
GLUCOSE: 110 mg/dL — AB (ref 65–99)
Potassium: 3.5 mmol/L (ref 3.5–5.1)
Sodium: 136 mmol/L (ref 135–145)
TOTAL PROTEIN: 5.3 g/dL — AB (ref 6.5–8.1)

## 2015-01-10 LAB — MRSA PCR SCREENING: MRSA BY PCR: NEGATIVE

## 2015-01-10 LAB — LIPID PANEL
CHOL/HDL RATIO: 4.9 ratio
CHOLESTEROL: 230 mg/dL — AB (ref 0–200)
HDL: 47 mg/dL (ref >40)
LDL Cholesterol: 169 mg/dL — ABNORMAL HIGH (ref 0–99)
TRIGLYCERIDES: 69 mg/dL (ref <150)
VLDL: 14 mg/dL (ref 0–40)

## 2015-01-10 LAB — TROPONIN I
TROPONIN I: 5.53 ng/mL — AB (ref <0.031)
Troponin I: 5.76 ng/mL (ref <0.031)

## 2015-01-10 LAB — APTT: aPTT: 111 seconds — ABNORMAL HIGH (ref 24–37)

## 2015-01-10 LAB — CK TOTAL AND CKMB (NOT AT ARMC)
CK, MB: 38.7 ng/mL — ABNORMAL HIGH (ref 0.5–5.0)
RELATIVE INDEX: 7.4 — AB (ref 0.0–2.5)
Total CK: 521 U/L — ABNORMAL HIGH (ref 49–397)

## 2015-01-10 LAB — PROTIME-INR
INR: 5.48 — AB (ref 0.00–1.49)
Prothrombin Time: 48.3 seconds — ABNORMAL HIGH (ref 11.6–15.2)

## 2015-01-10 LAB — POCT ACTIVATED CLOTTING TIME
ACTIVATED CLOTTING TIME: 446 s
Activated Clotting Time: 260 seconds

## 2015-01-10 SURGERY — LEFT HEART CATH AND CORONARY ANGIOGRAPHY
Anesthesia: LOCAL

## 2015-01-10 MED ORDER — MIDAZOLAM HCL 2 MG/2ML IJ SOLN
INTRAMUSCULAR | Status: DC | PRN
  Administered 2015-01-10: 2 mg via INTRAVENOUS
  Administered 2015-01-10 (×2): 1 mg via INTRAVENOUS

## 2015-01-10 MED ORDER — IOHEXOL 350 MG/ML SOLN
INTRAVENOUS | Status: DC | PRN
  Administered 2015-01-10: 125 mL via INTRAVENOUS

## 2015-01-10 MED ORDER — LISINOPRIL 5 MG PO TABS
5.0000 mg | ORAL_TABLET | Freq: Every day | ORAL | Status: DC
  Administered 2015-01-10 – 2015-01-11 (×2): 5 mg via ORAL
  Filled 2015-01-10 (×2): qty 1

## 2015-01-10 MED ORDER — MIDAZOLAM HCL 2 MG/2ML IJ SOLN
INTRAMUSCULAR | Status: AC
  Filled 2015-01-10: qty 2

## 2015-01-10 MED ORDER — DIPHENHYDRAMINE HCL 50 MG/ML IJ SOLN
INTRAMUSCULAR | Status: DC | PRN
  Administered 2015-01-10: 50 mg via INTRAVENOUS

## 2015-01-10 MED ORDER — ONDANSETRON 4 MG PO TBDP: 4.0000 mg | ORAL_TABLET | Freq: Three times a day (TID) | ORAL | Status: DC | PRN

## 2015-01-10 MED ORDER — INFLUENZA VAC SPLIT QUAD 0.5 ML IM SUSY
0.5000 mL | PREFILLED_SYRINGE | INTRAMUSCULAR | Status: AC
  Administered 2015-01-11: 0.5 mL via INTRAMUSCULAR
  Filled 2015-01-10: qty 0.5

## 2015-01-10 MED ORDER — FENTANYL CITRATE (PF) 100 MCG/2ML IJ SOLN
INTRAMUSCULAR | Status: DC | PRN
  Administered 2015-01-10 (×2): 25 ug via INTRAVENOUS
  Administered 2015-01-10: 50 ug via INTRAVENOUS

## 2015-01-10 MED ORDER — TICAGRELOR 90 MG PO TABS
ORAL_TABLET | ORAL | Status: DC | PRN
  Administered 2015-01-10: 180 mg via ORAL

## 2015-01-10 MED ORDER — FAMOTIDINE IN NACL 20-0.9 MG/50ML-% IV SOLN
INTRAVENOUS | Status: AC
  Filled 2015-01-10: qty 50

## 2015-01-10 MED ORDER — SODIUM CHLORIDE 0.9 % IV SOLN: 250.0000 mL | INTRAVENOUS | Status: DC | PRN

## 2015-01-10 MED ORDER — NITROGLYCERIN 0.4 MG SL SUBL: 0.4000 mg | SUBLINGUAL_TABLET | SUBLINGUAL | Status: DC | PRN

## 2015-01-10 MED ORDER — ONDANSETRON HCL 4 MG/2ML IJ SOLN: 4.0000 mg | Freq: Four times a day (QID) | INTRAMUSCULAR | Status: DC | PRN

## 2015-01-10 MED ORDER — FAMOTIDINE IN NACL 20-0.9 MG/50ML-% IV SOLN
INTRAVENOUS | Status: DC | PRN
  Administered 2015-01-10: 20 mg via INTRAVENOUS

## 2015-01-10 MED ORDER — SODIUM CHLORIDE 0.9 % IJ SOLN
3.0000 mL | Freq: Two times a day (BID) | INTRAMUSCULAR | Status: DC
  Administered 2015-01-11: 3 mL via INTRAVENOUS

## 2015-01-10 MED ORDER — ONDANSETRON HCL 4 MG/2ML IJ SOLN
INTRAMUSCULAR | Status: AC
  Filled 2015-01-10: qty 2

## 2015-01-10 MED ORDER — BIVALIRUDIN BOLUS VIA INFUSION - CUPID
INTRAVENOUS | Status: DC | PRN
  Administered 2015-01-10: 45.9 mg via INTRAVENOUS

## 2015-01-10 MED ORDER — ASPIRIN EC 81 MG PO TBEC
81.0000 mg | DELAYED_RELEASE_TABLET | Freq: Every day | ORAL | Status: DC
  Administered 2015-01-11: 81 mg via ORAL
  Filled 2015-01-10: qty 1

## 2015-01-10 MED ORDER — NITROGLYCERIN 1 MG/10 ML FOR IR/CATH LAB
INTRA_ARTERIAL | Status: AC
  Filled 2015-01-10: qty 10

## 2015-01-10 MED ORDER — BIVALIRUDIN 250 MG IV SOLR
INTRAVENOUS | Status: AC
  Filled 2015-01-10: qty 250

## 2015-01-10 MED ORDER — ONDANSETRON HCL 4 MG/2ML IJ SOLN
INTRAMUSCULAR | Status: DC | PRN
  Administered 2015-01-10: 4 mg via INTRAVENOUS

## 2015-01-10 MED ORDER — LIDOCAINE HCL (PF) 1 % IJ SOLN
INTRAMUSCULAR | Status: DC | PRN
  Administered 2015-01-10: 18:00:00

## 2015-01-10 MED ORDER — METHYLPREDNISOLONE SODIUM SUCC 125 MG IJ SOLR
INTRAMUSCULAR | Status: AC
  Filled 2015-01-10: qty 2

## 2015-01-10 MED ORDER — ATORVASTATIN CALCIUM 40 MG PO TABS
40.0000 mg | ORAL_TABLET | Freq: Every evening | ORAL | Status: DC
  Administered 2015-01-10: 40 mg via ORAL
  Filled 2015-01-10: qty 1

## 2015-01-10 MED ORDER — ATROPINE SULFATE 0.1 MG/ML IJ SOLN
INTRAMUSCULAR | Status: AC
  Filled 2015-01-10: qty 10

## 2015-01-10 MED ORDER — HEPARIN (PORCINE) IN NACL 2-0.9 UNIT/ML-% IJ SOLN
INTRAMUSCULAR | Status: AC
  Filled 2015-01-10: qty 1500

## 2015-01-10 MED ORDER — DIPHENHYDRAMINE HCL 50 MG/ML IJ SOLN
INTRAMUSCULAR | Status: AC
  Filled 2015-01-10: qty 1

## 2015-01-10 MED ORDER — ACETAMINOPHEN 325 MG PO TABS: 650.0000 mg | ORAL_TABLET | ORAL | Status: DC | PRN

## 2015-01-10 MED ORDER — TICAGRELOR 90 MG PO TABS
ORAL_TABLET | ORAL | Status: AC
  Filled 2015-01-10: qty 2

## 2015-01-10 MED ORDER — DIAZEPAM 2 MG PO TABS
2.0000 mg | ORAL_TABLET | ORAL | Status: DC | PRN
Start: 2015-01-10 — End: 2015-01-11

## 2015-01-10 MED ORDER — FENTANYL CITRATE (PF) 100 MCG/2ML IJ SOLN
INTRAMUSCULAR | Status: AC
  Filled 2015-01-10: qty 2

## 2015-01-10 MED ORDER — METHYLPREDNISOLONE SODIUM SUCC 125 MG IJ SOLR
INTRAMUSCULAR | Status: DC | PRN
  Administered 2015-01-10: 125 mg via INTRAVENOUS

## 2015-01-10 MED ORDER — LIDOCAINE HCL (PF) 1 % IJ SOLN
INTRAMUSCULAR | Status: AC
  Filled 2015-01-10: qty 30

## 2015-01-10 MED ORDER — MORPHINE SULFATE (PF) 2 MG/ML IV SOLN: 2.0000 mg | INTRAVENOUS | Status: DC | PRN

## 2015-01-10 MED ORDER — SODIUM CHLORIDE 0.9 % WEIGHT BASED INFUSION
3.0000 mL/kg/h | INTRAVENOUS | Status: AC
  Administered 2015-01-10: 3 mL/kg/h via INTRAVENOUS

## 2015-01-10 MED ORDER — SODIUM CHLORIDE 0.9 % IJ SOLN: 3.0000 mL | INTRAMUSCULAR | Status: DC | PRN

## 2015-01-10 MED ORDER — SODIUM CHLORIDE 0.9 % IV SOLN
250.0000 mg | INTRAVENOUS | Status: DC | PRN
  Administered 2015-01-10: 1.75 mg/kg/h via INTRAVENOUS

## 2015-01-10 MED ORDER — NITROGLYCERIN 1 MG/10 ML FOR IR/CATH LAB
INTRA_ARTERIAL | Status: DC | PRN
  Administered 2015-01-10 (×2): 200 ug via INTRACORONARY

## 2015-01-10 SURGICAL SUPPLY — 18 items
BALLN EMERGE MR 2.0X12 (BALLOONS) ×2
BALLN ~~LOC~~ EUPHORA RX 3.25X20 (BALLOONS) ×2
BALLOON EMERGE MR 2.0X12 (BALLOONS) ×1 IMPLANT
BALLOON ~~LOC~~ EUPHORA RX 3.25X20 (BALLOONS) ×1 IMPLANT
CATH INFINITI 5FR MULTPACK ANG (CATHETERS) ×2 IMPLANT
GLIDESHEATH SLEND A-KIT 6F 22G (SHEATH) ×2 IMPLANT
GUIDE CATH RUNWAY 6FR FR4 (CATHETERS) ×2 IMPLANT
KIT HEART LEFT (KITS) ×2 IMPLANT
PACK CARDIAC CATHETERIZATION (CUSTOM PROCEDURE TRAY) ×2 IMPLANT
SHEATH PINNACLE 6F 10CM (SHEATH) ×2 IMPLANT
STENT SYNERGY DES 3X32 (Permanent Stent) ×2 IMPLANT
SYR MEDRAD MARK V 150ML (SYRINGE) ×2 IMPLANT
TRANSDUCER W/STOPCOCK (MISCELLANEOUS) ×2 IMPLANT
TUBING CIL FLEX 10 FLL-RA (TUBING) ×2 IMPLANT
WIRE ASAHI PROWATER 180CM (WIRE) ×4 IMPLANT
WIRE EMERALD 3MM-J .035X150CM (WIRE) ×2 IMPLANT
WIRE HI TORQ BMW 190CM (WIRE) ×2 IMPLANT
WIRE SAFE-T 1.5MM-J .035X260CM (WIRE) ×2 IMPLANT

## 2015-01-10 NOTE — Progress Notes (Signed)
01/10/2015 2025 Rt femoral sheath removed. Manual pressure held x 20 minutes. Pressure dssg applied. Instructions for bedrest x 4 hours given.  Instructed what to call nurse bleeding or pain at site. Site level 0 pre/post removal. Mckena Chern, Linnell Fullingose Burnett

## 2015-01-10 NOTE — Progress Notes (Addendum)
01/10/2015 7:39 PM Trop I  5.76  Expected results of STEMI post cath. Dr. Terressa KoyanagiBalfour aware of result  INR 5.48 also given to Dr.Balfour. Ignatz Deis, Linnell Fullingose Burnett

## 2015-01-10 NOTE — H&P (Signed)
Patient ID: [name] MRN: [id number], DOB/AGE: 1975-12-21   Admit date: 01/10/2015   Primary Physician: No PCP Per Patient Primary Cardiologist: Dr. Tresa Mills   NWG:NFAOZHHPI:[name] [name] is a 39 y.o. male with past medical history of CAD (s/p BMS to RCA in 2009, restenosis of RCA stent in 06/2013 treated with cutting balloon only, most recent PCI with DES x2 to the proximal and distal RCA in 01/2014), medication noncompliance, substance abuse, HTN, HLD, and prior ICM (EF 40% in 2011, 50% on cath in 01/2014) [facility] as a CODE STEMI.   Patient reports he was walking out of a shop earlier today when he developed sudden onset chest pain. Reports it was accompanied by dyspnea, nausea, and shivering. Feels like his previous heart attacks. Pain started 30 minutes prior to his arrival to the ED and is still present during initial encounter. 324mg  ASA and SL NTG administered en route to the hospital by EMS.   Reports he was on Brilinta and ASA following his stent placement in 01/2014. Reports being homeless and has been unable to afford Brilinta. Last took the medications 6 months ago. According to our records, he was successfully enrolled in the Brilinta assistance program and the medication for approved for one year.  Reports still using tobacco products and marijuana (last used 3 days ago). Denies any cocaine use.   Home Medications Prior to Admission medications   Medication Sig Start Date End Date Taking? Authorizing Provider  aspirin EC 81 MG tablet Take 81 mg by mouth daily.    Historical Provider, [name]  atorvastatin (LIPITOR) 80 MG tablet Take 0.5 tablets (40 mg total) by mouth every evening. Patient not taking: Reported on 07/05/2014 02/20/14   Justin MacadamiaLori C Gerhardt, NP  DimenhyDRINATE (MOTION SICKNESS PO) Take 1 tablet by mouth daily.    Historical Provider, [name]  lisinopril (PRINIVIL,ZESTRIL) 5 MG tablet Take 1 tablet (5 mg total) by mouth daily. Patient not  taking: Reported on 07/05/2014 02/20/14   Justin MacadamiaLori C Gerhardt, NP  nitroGLYCERIN (NITROSTAT) 0.4 MG SL tablet Place 1 tablet (0.4 mg total) under the tongue every 5 (five) minutes as needed for chest pain (up to 3 doses). 01/15/14   Justin N Dunn, PA-C  ondansetron (ZOFRAN ODT) 4 MG disintegrating tablet Take 1 tablet (4 mg total) by mouth every 8 (eight) hours as needed for nausea. 08/09/14   Justin HatchetLisa M Sanders, PA-C  promethazine (PHENERGAN) 25 MG tablet Take 1 tablet (25 mg total) by mouth every 6 (six) hours as needed for nausea or vomiting. 07/05/14   Justin MawKristen N Ward, DO  ticagrelor (BRILINTA) 90 MG TABS tablet Take 1 tablet (90 mg total) by mouth 2 (two) times daily. 01/15/14   Justin N Dunn, PA-C    Allergies Allergies  Allergen Reactions  . Iodine Shortness Of Breath and Swelling  . Shellfish Allergy Anaphylaxis    All shellfish  . Contrast Media [Iodinated Diagnostic Agents] Nausea And Vomiting    Past Medical History Past Medical History  Diagnosis Date  . Bipolar 1 disorder ([name])   . Hypertension   . Hyperlipidemia   . Hx of medication noncompliance   . Polysubstance abuse     a. h/o tobacco, marijuana and crack cocaine use. b. 07/2013: +UDS THC, neg for cocaine.  . Anxiety   . CAD S/P percutaneous coronary angioplasty     a. inferior STEMI s/p BMS to RCA 01/2007. b. NSTEMI 07/2013 s/p PTCA to RCA for  restenosis (no stenting due to hx noncompliance). c. Transient inferior ST elevation (peak troponin 0.25) 01/2014 s/p PTCA/DES to prox RCA, PTCA/DES to distal RCA, EF 50%.  . NSVT (nonsustained ventricular tachycardia) ([name])     a. Very brief run during 07/2013 admit for NSTEMI felt due to MI.  . Ischemic cardiomyopathy     a. EF 40% in 2011, 60% in 2012. b. EF 55% by cath 07/2013. c. EF 50% by cath 01/2014.  . Tobacco abuse      Surgical History Past Surgical History  Procedure Laterality Date  . Femoral artery stent    . Coronary angioplasty with stent placement  02/05/2007    3.5x93mm Quantum  non-DES to RCA (Dr. Nicki Mills)  . Cardiac catheterization  01/16/2009    normal left main, Cfx with 2-OMs both w/minor irregularities, LAd with 20-30% mid region irregularities, ramus intermediate/optional diagonal with 60% osital narrowing, RCA with stent in distal portion w/20% prox in-stent stenosis (Dr. Mervyn Skeeters. Mills)  . Cardiac catheterization  07/23/2013    two vessel obstructive CAD, occluded first diagonal, focal in-stent restenosis in distal RCA (Dr. Peter Mills)  . Left heart catheterization with coronary angiogram N/A 07/23/2013    Procedure: LEFT HEART CATHETERIZATION WITH CORONARY ANGIOGRAM;  Surgeon: Justin M Swaziland, [name];  Location: [name] CATH LAB;  Service: Cardiovascular;  Laterality: N/A;  . Left and right heart catheterization with coronary angiogram N/A 01/14/2014    Procedure: LEFT AND RIGHT HEART CATHETERIZATION WITH CORONARY ANGIOGRAM;  Surgeon: Justin Hazel, [name];  Location: [facility] CATH LAB;  Service: Cardiovascular;  Laterality: N/A;     Family History Family History  Problem Relation Age of Onset  . CAD Mother   . Heart disease Mother   . Heart attack Mother   . CAD Sister     Social History Social History   Social History  . Marital Status: Single    Spouse Name: N/A  . Number of Mills: N/A  . Years of Education: N/A   Occupational History  . Not on file.   Social History Main Topics  . Smoking status: Current Every Day Smoker -- 0.25 packs/day    Types: Cigarettes  . Smokeless tobacco: Not on file  . Alcohol Use: 0.0 oz/week    0 Standard drinks or equivalent per week  . Drug Use: Yes    Special: Marijuana     Comment: Marijuana (last used on 01/07/2015)  . Sexual Activity: Not Currently   Other Topics Concern  . Not on file   Social History Narrative     Review of Systems General:  No chills, fever, night sweats or weight changes.  Cardiovascular:  No edema, orthopnea, palpitations, paroxysmal nocturnal dyspnea. Positive for chest pain and  dyspnea. Dermatological: No rash, lesions/masses Respiratory: No cough, Positive for dyspnea Urologic: No hematuria, dysuria Abdominal:   No nausea, vomiting, diarrhea, bright red blood per rectum, melena, or hematemesis Neurologic:  No visual changes, wkns, changes in mental status. All other systems reviewed and are otherwise negative except as noted above.   Physical Exam: SpO2 99 %.  General: Frail-appearing African American,male. Shivering throughout encounter. Head: Normocephalic, atraumatic, sclera non-icteric, no xanthomas, nares are without discharge. Dentition:  Neck: No carotid bruits. JVD not elevated.  Lungs: Respirations regular and unlabored, without wheezes or rales.  Heart: Regular rate and rhythm. No S3 or S4.  No murmur, no rubs, or gallops appreciated. Abdomen: Soft, non-tender, non-distended with normoactive bowel sounds. No hepatomegaly. No rebound/guarding. No obvious abdominal  masses. Msk:  Strength and tone appear normal for age. No joint deformities or effusions. Extremities: No clubbing or cyanosis. No edema.  Distal pedal pulses are 2+ bilaterally. Neuro: Alert and oriented X 3. Moves all extremities spontaneously. No focal deficits noted. Psych:  Responds to questions appropriately with a normal affect. Skin: No rashes or lesions noted  ECG: NSR. ST elevation in inferior leads.  Cardiac Catheterization:01/14/2014 Procedure Performed:  1. Left Heart Catheterization 2. Selective Coronary Angiography 3. Left ventricular angiogram 4. PTCA/DES x 1 proximal RCA 5. PTCA/DES x 1 distal RCA  Operator: Verne Carrow, [name]  Arterial access site: Right radial artery.   Hemodynamic Findings: Central aortic pressure: 136/81 Left ventricular pressure: 115/4/18  Left circumflex (LCx): The LCx is a large vessel giving rise to 2 OM branches. There is diffuse ectasia. There is a 50% stenosis in the mid LCx.  Angiographic Findings:  Left main: No  obstructive disease.   Left Anterior Descending Artery:Large caliber vessel that courses to the apex with diffuse 20-30% stenosis in the proximal, mid and distal vessel. The first diagonal is occluded proximally. The second diagonal is a very small caliber vessel with ostial 70% stenosis.   Circumflex Artery: Large caliber vessel with diffuse ectasia giving rise to 2 obtuse marginal branches. The first OM branch has a 30% stenosis. The mid Circumflex has a 50% stenosis.   Right Coronary Artery: Large caliber dominant vessel with 99% sub-total occlusion proximally with appearance of thrombus. The mid vessel has a 40% stenosis. The distal vessel has a stented segment with 80% proximal edge restenosis as well as a 80% stenosis just proximal to the stented segment.   Left Ventricular Angiogram: 50%, no MR  Impression: 1. Severe single vessel CAD 2. Severe stenosis proximal RCA and distal RCA 3. Preserved LV systolic function 4. NSTEMI (initial ST elevation while in EMS but chest pain resolved before arrival to ED and EKG normalized) 5. Successful PTCA/DES x 1 proximal RCA 6. Successful PTCA/DES x 1 distal RCA  Recommendations: He will need Brilinta 90 mg po BID for at least one year. Will get him into the assistance program. Continue ASA and statin. Start beta blocker if HR allows. Angiomax for 2 hours at reduced rate then d/c.    ASSESSMENT AND PLAN:  Principal Problem:  1. ST elevation myocardial infarction (STEMI) of inferior wall ([name]) - history of CAD s/p BMS to RCA in 2009, restenosis of RCA stent in 06/2013 treated with cutting balloon only, most recent PCI with DES x2 to the proximal and distal RCA in 01/2014 - presents with sudden onset of chest pain, dyspnea, and nausea that started 30 minutes prior to arrival - EKG shows ST elevation in the inferior leads - taken to the cath lab for emergent cardiac catheterization and possible PCI  2. Essential hypertension - continue to  monitor  - will restart Lisinopril  3. HLD - restart statin therapy  4. Medication noncompliance - reports being homeless and unable to afford Brilinta (has not taken for 6+ months) but appears he was successfully enrolled in the medication assistance program but never followed-up. - this will need to be continually enforced.  5. Tobacco and Substance Abuse - still smoking < 0.5ppd. Reports last using Marijuana 3 days ago. Denies cocaine use.  - will obtain UDS  Signed, Ellsworth Lennox, PA-C 01/10/2015, 4:21 PM Pager: [phone]  I seen and evaluated the patient along with Ellsworth Lennox, PA-C together in the Cath Lab. Roughly 30  minutes prior to arrival he started having severe substernal chest pain. He has a history of a registered the right coronary artery most recently in January of this year with 2 DES stents placed in Dr. Clifton James. Unfortunately he has been not taking Brilinta for about 6 months. He now presents with 2-3 mm ST elevations in leads II, III, and aVF consistent with inferior STEMI. Code STEMI was called by EMS in route. He presented directly to the cardiac catheterization lab for STEMI with emergent cardiac catheterization and PCI.    Marykay Lex, M.D., M.S. Interventional Cardiologist   Pager # [phone]

## 2015-01-11 ENCOUNTER — Other Ambulatory Visit (HOSPITAL_COMMUNITY): Payer: Self-pay

## 2015-01-11 ENCOUNTER — Encounter (HOSPITAL_COMMUNITY): Payer: Self-pay

## 2015-01-11 DIAGNOSIS — I119 Hypertensive heart disease without heart failure: Secondary | ICD-10-CM

## 2015-01-11 LAB — BASIC METABOLIC PANEL WITH GFR
Anion gap: 10 (ref 5–15)
BUN: 6 mg/dL (ref 6–20)
CO2: 25 mmol/L (ref 22–32)
Calcium: 9.2 mg/dL (ref 8.9–10.3)
Chloride: 101 mmol/L (ref 101–111)
Creatinine, Ser: 1.13 mg/dL (ref 0.61–1.24)
GFR calc Af Amer: >60 mL/min (ref >60)
GFR calc non Af Amer: >60 mL/min (ref >60)
Glucose, Bld: 140 mg/dL — ABNORMAL HIGH (ref 65–99)
Potassium: 4.5 mmol/L (ref 3.5–5.1)
Sodium: 136 mmol/L (ref 135–145)

## 2015-01-11 LAB — CBC
HCT: 42.8 % (ref 39.0–52.0)
Hemoglobin: 14.8 g/dL (ref 13.0–17.0)
MCH: 31.3 pg (ref 26.0–34.0)
MCHC: 34.6 g/dL (ref 30.0–36.0)
MCV: 90.5 fL (ref 78.0–100.0)
Platelets: 239 K/uL (ref 150–400)
RBC: 4.73 MIL/uL (ref 4.22–5.81)
RDW: 15.7 % — ABNORMAL HIGH (ref 11.5–15.5)
WBC: 6.1 K/uL (ref 4.0–10.5)

## 2015-01-11 LAB — HEMOGLOBIN A1C
HEMOGLOBIN A1C: 5.8 % — AB (ref 4.8–5.6)
Mean Plasma Glucose: 120 mg/dL

## 2015-01-11 LAB — RAPID URINE DRUG SCREEN, HOSP PERFORMED
Amphetamines: NOT DETECTED
Barbiturates: NOT DETECTED
Benzodiazepines: POSITIVE — AB
COCAINE: NOT DETECTED
OPIATES: POSITIVE — AB
Tetrahydrocannabinol: POSITIVE — AB

## 2015-01-11 LAB — PROTIME-INR
INR: 1.05 (ref 0.00–1.49)
Prothrombin Time: 13.9 seconds (ref 11.6–15.2)

## 2015-01-11 LAB — TROPONIN I
Troponin I: >65 ng/mL (ref <0.031)
Troponin I: >65 ng/mL (ref <0.031)

## 2015-01-11 MED ORDER — LISINOPRIL 5 MG PO TABS: 5.0000 mg | ORAL_TABLET | Freq: Every day | ORAL | Status: DC

## 2015-01-11 MED ORDER — TICAGRELOR 90 MG PO TABS: 90.0000 mg | ORAL_TABLET | Freq: Two times a day (BID) | ORAL | Status: DC

## 2015-01-11 MED ORDER — NITROGLYCERIN 0.4 MG SL SUBL: 0.4000 mg | SUBLINGUAL_TABLET | SUBLINGUAL | Status: DC | PRN

## 2015-01-11 MED ORDER — PRAVASTATIN SODIUM 40 MG PO TABS: 40.0000 mg | ORAL_TABLET | Freq: Every day | ORAL | Status: DC

## 2015-01-11 NOTE — Progress Notes (Signed)
Pt off monitor and dressed. Able to walk 700 ft without c/o. (Did not take vitals). Seemed to be doing ok. Ed completed/reviewed. Sts he understands importance of Brilinta (and how to get it), ASA, quitting smoking, ex, NTG, and CRPII. Will send referral to [facility] CRPII. Pt sts he is motivated for change. Encouraged pt to take it easy and not over work for 2 weeks (including sex). [phone] [name] CES, ACSM 12:16 PM 01/11/2015

## 2015-01-11 NOTE — Progress Notes (Signed)
Patient Name: [name] Date of Encounter: 01/11/2015  Principal Problem:   ST elevation myocardial infarction (STEMI) of inferior wall (HCC) Active Problems:   Essential hypertension   CAD S/P BMS PCI to RCA with PTCA for ISR --> followed by stent thrombosis - DES PCI   Coronary stent thrombosis   DES PCI to RCA x 2 (3.0 mm x 22 mm Resolute - distal & proximal RCA)   Hx of medication noncompliance   Tobacco abuse   Medical non-compliance   ST elevation myocardial infarction (STEMI) of inferior wall, initial episode of care (HCC)   Length of Stay: 1  SUBJECTIVE  He denies any chest pain or SOB, feels well and is insisting on going home today.   CURRENT MEDS . aspirin EC  81 mg Oral Daily  . atorvastatin  40 mg Oral QPM  . Influenza vac split quadrivalent PF  0.5 mL Intramuscular Tomorrow-1000  . lisinopril  5 mg Oral Daily  . sodium chloride  3 mL Intravenous Q12H    OBJECTIVE  Filed Vitals:   01/11/15 0500 01/11/15 0600 01/11/15 0700 01/11/15 0800  BP: 144/97 127/88 132/96 124/95  Pulse: 64 78 64 61  Temp: 98.4 F (36.9 C)   98.3 F (36.8 C)  TempSrc: Oral   Oral  Resp: Height:      Weight:      SpO2: 100% 95% 99% 100%    Intake/Output Summary (Last 24 hours) at 01/11/15 0855 Last data filed at 01/11/15 0700  Gross per 24 hour  Intake 1070.11 ml  Output    550 ml  Net 520.11 ml   Filed Weights   01/10/15 1745  Weight: 125 lb 7.1 oz (56.9 kg)    PHYSICAL EXAM  General: Pleasant, NAD. Neuro: Alert and oriented X 3. Moves all extremities spontaneously. Psych: Normal affect. HEENT:  Normal  Neck: Supple without bruits or JVD. Lungs:  Resp regular and unlabored, CTA. Heart: RRR no s3, s4, or murmurs. Abdomen: Soft, non-tender, non-distended, BS + x 4.  Extremities: No clubbing, cyanosis or edema. DP/PT/Radials 2+ and equal bilaterally.  Accessory Clinical Findings  CBC  Recent Labs  01/10/15 1730 01/11/15 0533  WBC 4.9  6.1  NEUTROABS 3.4  --   HGB 13.7 14.8  HCT 39.2 42.8  MCV 89.5 90.5  PLT 220 239   Basic Metabolic Panel  Recent Labs  01/10/15 1730 01/11/15 0533  NA 136 136  K 3.5 4.5  CL 101 101  CO2 26 25  GLUCOSE 110* 140*  BUN <5* 6  CREATININE 1.19 1.13  CALCIUM 8.5* 9.2   Liver Function Tests  Recent Labs  01/10/15 1730  AST 65*  ALT 20  ALKPHOS 42  BILITOT 0.4  PROT 5.3*  ALBUMIN 3.2*    Recent Labs  01/10/15 1730 01/10/15 2325 01/11/15 0533  CKTOTAL 521*  --   --   CKMB 38.7*  --   --   TROPONINI 5.76*  5.53* >65.00* >65.00*    Recent Labs  01/10/15 1730  HGBA1C 5.8*   Fasting Lipid Panel  Recent Labs  01/10/15 1730  CHOL 230*  HDL 47  LDLCALC 169*  TRIG 69  CHOLHDL 4.9   TELE: SR, nsVT 3-4 beats  ECG: Resolved STE in the inferior leads, now Q wave    ASSESSMENT AND PLAN   1. ST elevation myocardial infarction (STEMI) of inferior wall (HCC) - history of CAD s/p BMS to  RCA in 2009, restenosis of RCA stent in 06/2013 treated with cutting balloon only, most recent PCI with DES x2 to the proximal and distal RCA in 01/2014 Cath showed: 1. Mid RCA to Dist RCA lesion, 100% stenosed -- in-stent thrombosis of the proximal portion of distal RCA overlapping DES and BMS stents. The lesion was previously treated with a drug-eluting stent six to twelve months ago. January 2016 ---> Successful revascularization of occluded vessel with balloon angioplasty followed by Synergy DES PCI. Post intervention, there is a 0% residual stenosis. 2. Prox Cx lesion, 45% stenosed. Mid Cx lesion, 40% stenosed. 3. Ost 1st Diag to 1st Diag lesion, 60% stenosed. 4. There is mild to moderate left ventricular systolic dysfunction.  2. Essential hypertension Controlled after restarting lisinopril  3. HLD - significantly elevated LDL 165 - restarted statin therapy  4. Medication noncompliance - reports being homeless and unable to afford Brilinta (has not taken for 6+  months) but appears he was successfully enrolled in the medication assistance program but never followed-up. - this will need to be continually enforced.  5. Tobacco and Substance Abuse - still smoking < 0.5ppd. Reports last using Marijuana 3 days ago. Denies cocaine use.  - will obtain UDS  The patient is insisting on being discharged, despite explaining all of the possible risks. He is determined to go home for [employer] Year[employer] celebration with family. Advised to come back if any signs of CP or SOB recur. Advised not to drive or lift anything heavy. The major problem is his medication non-compliance, I have talked to pharmacist who is able to provide medicine supplies  For the next 3 days.  We will print all the prescriptions.   Signed, Lars MassonNELSON, Maleta Pacha H MD, [facility] 01/11/2015

## 2015-01-11 NOTE — Clinical Social Work Note (Signed)
Clinical Social Work Assessment  Patient Details  Name: [name] MRN: [id number] Date of Birth: 14-Jul-1975  Date of referral:  01/11/15               Reason for consult:  Housing Concerns/Homelessness (Order for Homelessness but NOT homeless)                Permission sought to share information with:    Permission granted to share information::  No (Patient manages own medical affairs)           Housing/Transportation Living arrangements for the past 2 months:  Apartment (With Girlfriend Sports coach) Source of Information:  Patient Patient Interpreter Needed:  None Criminal Activity/Legal Involvement Pertinent to Current Situation/Hospitalization:  No - Comment as needed Significant Relationships:  Significant Other, Parents, Siblings (Girlfriend, father) Lives with:  Significant Other (GF) Do you feel safe going back to the place where you live?  Yes Need for family participation in patient care:  No (Coment)  Care giving concerns:  Patient has been homeless until several months ago- was not taking his medications- no insurance and not working. States has multiple cardiac issues.  Social Worker assessment / plan:  CSW met with patient today to assess for homelessness. Patient sitting up in bed and is alert and oriented. He states that he had been homeless since February of this year until several months ago when his girlfriend [name] was able to secure an apartment for them both.  He states that this is a safe, warm environment and he does not feel that he has this stressor on him any longer.  Patient had multiple concerns about not being able to afford his medications and CSW notified weekend [facility] coverage for this. Also message left for [employer] Counselor to follow up with patient re: hospital bill and possible Medicaid.  Patient states that he tried to get disability several years ago and was denied. He states he has not been able to work and feels that his cardiac issues  are preventing him for being able to work as well as his mental health issues (reports being Schizophrenic and Bipolar).  He is able to verbalize the importance of self care and of taking his meds for his mental health issues.  Patient had questions about disability and Medicaid and CSW provided answers.  He will follow up with Social Security for disability application and Orthoptist for [employer]. Per Medical chart- patient also appears to have UDS + for MJ, Benzos and opiates.  He admits to recent use of MJ only and denies any other drug use despite positive drug screen.  He states that he was smoking cigarettes due to high stress of homelessness. Patient reports his support system is his girlfriend [name], his father and his sister. His mother passed away several years ago per patient from cardiac issues and she also had bipolar and schizophrenia per patient.  He verbalizes fears that he is going to die early like his mother due to these issues.  Patient denies any SI or HI ideations and states he feels that his mental health is stable.  He was encouraged to follow up with mental health should he have any problems and stressed importance of medication compliance.   Per MD note- patient insists on going home today to be able to observe the New Year's Holiday- he did not relay this to this CSW- however he did say that his d/c plan was to return home.  Patient was appreciative of CSW's visit and information provided and denied any further questions or needs. CSW signing off.  Employment status:  Unemployed (Wants disability) Insurance information:  Other (Comment Required) (Currently does not have any insurance) PT Recommendations:  Not assessed at this time Information / Referral to community resources:  Other (Comment Required) (Orthoptist for [employer], [place].  Financial counselor for bill assistance- ? Medicaid application,  Social Security for  Disability)  Patient/Family's Response to care:  Patient states he is feeling better and denies any questions or concerns.  MD note indicates patient's desire to leave today.  Patient/Family's Understanding of and Emotional Response to Diagnosis, Current Treatment, and Prognosis:  Patient is able to discuss issues regarding his cardiac condition as well as mental health history. He seemed to have limited knowledge of treatment plan and after care such and observing dietary restrictions, medication compliance and self care.  He states that he is feeling better and denies any issues at this time.  Emotional Assessment Appearance:  Appears stated age Attitude/Demeanor/Rapport:   (Cooperative, appreciative of CSW's visit, responsive) Affect (typically observed):  Pleasant, Appropriate, Calm Orientation:  Oriented to Self, Oriented to Place, Oriented to  Time, Oriented to Situation Alcohol / Substance use:  Tobacco Use, Illicit Drugs (Admits to MJ use; positive for optiates, benzos, and MJ) Psych involvement (Current and /or in the community):  No (Comment) (Reports Schizophrenia and Bipolar)  Discharge Needs  Concerns to be addressed:  Medication Concerns, Financial / Insurance Concerns Readmission within the last 30 days:  No Current discharge risk:  None Barriers to Discharge:  Continued Medical Work up   Kendell Bane T, LCSW 01/11/2015, 10:22 AM

## 2015-01-11 NOTE — Discharge Summary (Signed)
Discharge Summary    Patient ID: [name],  MRN: [id number], DOB/AGE: 1975-12-08 39 y.o.  Admit date: 01/10/2015 Discharge date: 01/11/2015  Primary Care Provider: No PCP Per Patient Primary Cardiologist: Bishop Limbo, MD   Discharge Diagnoses    Principal Problem:   ST elevation myocardial infarction (STEMI) of inferior wall, initial episode of care [facility])  **s/p PCI and DES placement within the RCA for recurrent in-stent restenosis.  Active Problems:   CAD S/P BMS PCI to RCA with PTCA for ISR --> followed by stent thrombosis - DES PCI   Ischemic cardiomyopathy   Tobacco abuse   Hypertensive heart disease   Hyperlipidemia with target LDL less than 70   Hx of medication noncompliance   Homelessness  Allergies Allergies  Allergen Reactions  . Iodine Shortness Of Breath and Swelling  . Shellfish Allergy Anaphylaxis    All shellfish  . Contrast Media [Iodinated Diagnostic Agents] Nausea And Vomiting    Diagnostic Studies/Procedures    Cardiac Catheterization and Percutaneous Coronary Intervention 12.30.2016  Coronary Findings     Dominance: Right    Left Main  . Vessel is large.      Left Anterior Descending  . Vessel is large. The vessel exhibits minimal luminal irregularities.   . First Diagonal Branch   The vessel is small in size. There is mild disease in the vessel.   Suezanne Jacquet 1st Diag to 1st Diag lesion, 60% stenosed. Discrete.   . First Septal Branch   The vessel is small in size.   Marland Kitchen Second Diagonal Branch   The vessel is small in size and exhibits minimal luminal irregularities.   . Second Septal Branch   The vessel is small in size.   . Third Septal Branch   The vessel is small in size.      Left Circumflex   . Prox Cx lesion, 45% stenosed. Tubular eccentric.   . Mid Cx lesion, 40% stenosed. Tubular.   . First Obtuse Marginal Branch   The vessel is large in size.   . Lateral First Obtuse Marginal Branch   The vessel is small in size.   Marland Kitchen  Second Obtuse Marginal Branch   The vessel is moderate in size.   . Third Obtuse Marginal Branch   The vessel is small in size.      Right Coronary Artery  . Vessel is large.   . Prox RCA lesion, 0% stenosed. Diffuse. Previously placed Prox RCA drug eluting stent is patent. Resolute DES stent 3.0 mm x 20 mm   . Mid RCA lesion, 30% stenosed. Diffuse.   . Mid RCA to Dist RCA lesion, 100% stenosed. Diffuse thrombotic with heavy thrombus ulcerative.   . Dist RCA lesion, 100% stenosed. Diffuse thrombotic located proximal to major branch. In-stent thrombosis at the proximal one third of the stent The lesion was previously treated with a drug-eluting stent six to twelve months ago. January 2016 Integrity Resolute 3.0 mm x 20 mm   . PCI: The mid-distal RCA was successfully treated using a 3.0 x 32 mm Synergy DES.     Marland Kitchen Acute Marginal Branch   The vessel is small in size.   . Right Posterior Descending Artery   The vessel is moderate in size. Minimal diffuse disease   . Right Posterior Atrioventricular Branch   The vessel is small in size.   . First Right Posterolateral   The vessel is small in size.   . Third Right  Posterolateral   The vessel is small in size.     Left Ventricle The left ventricular size is normal. There is mild to moderate left ventricular systolic dysfunction. The left ventricular ejection fraction is 35-45% by visual estimate. There are wall motion abnormalities in the left ventricle. There are segmental wall motion abnormalities in the left ventricle.   Aortic Valve There is no aortic valve stenosis, and trivial (1+) aortic regurgitation. _____________   History of Present Illness     39 year old male with a prior history of coronary artery disease status post bare metal stent to right coronary artery with subsequent restenosis and cutting balloon angioplasty in June 2015, followed by recurrent restenosis in January 2016 requiring drug-eluting stent placement 2. Patient  also has a history of medication noncompliance, substance abuse, hypertension, hyperlipidemia, homelessness, and ischemic myopathy. He was in his usual state of health until the day of admission when he developed sudden onset of chest pain associated with dyspnea, nausea, and shivering. EMS was called and he was noted to have inferior ST segment elevation on ECG. A code STEMI was activated and the patient [facility].  Hospital Course     Consultants: None   Patient underwent emergent diagnostic heart catheterization revealing severe in-stent restenosis and heavy thrombus burden in the mid to distal right coronary artery. The previously placed proximal RCA stent was widely patent. The mid and distal right coronary artery successfully treated using a 3.0 x 32 mm Synergy drug eluting stent. Patient tolerated procedure well. Postprocedure, troponin peaked at greater than 65. He was placed on aspirin, brilinta, and statin therapy. With relative bradycardia, blocker was not initiated. Patient's had no further chest pain and is insisting on discharge. He has been counseled that he should ideally see in the hospital at least another 24 hours but again, he is insistent upon leaving. He has been advised that if he has any recurrence of symptoms, he should return to the emergency room immediately.  I have refilled out paperwork for the [place] and [place] program for Brilinta and have also provided him with a card for reduced cost brilinta.  We discussed the details of these programs and he confirmed that he will follow through and fill out the necessary paperwork. _____________  Discharge Vitals Blood pressure 127/89, pulse 60, temperature 98.3 F (36.8 C), temperature source Oral, resp. rate 18, height 6' (1.829 m), weight 125 lb 7.1 oz (56.9 kg), SpO2 100 %.  Filed Weights   01/10/15 1745  Weight: 125 lb 7.1 oz (56.9 kg)    Labs & Radiologic Studies      CBC  Recent Labs  01/10/15 1730 01/11/15 0533  WBC 4.9 6.1  NEUTROABS 3.4  --   HGB 13.7 14.8  HCT 39.2 42.8  MCV 89.5 90.5  PLT 220 239   Basic Metabolic Panel  Recent Labs  01/10/15 1730 01/11/15 0533  NA 136 136  K 3.5 4.5  CL 101 101  CO2 26 25  GLUCOSE 110* 140*  BUN <5* 6  CREATININE 1.19 1.13  CALCIUM 8.5* 9.2   Liver Function Tests  Recent Labs  01/10/15 1730  AST 65*  ALT 20  ALKPHOS 42  BILITOT 0.4  PROT 5.3*  ALBUMIN 3.2*   Cardiac Enzymes  Recent Labs  01/10/15 1730 01/10/15 2325 01/11/15 0533  CKTOTAL 521*  --   --   CKMB 38.7*  --   --   TROPONINI 5.76*  5.53* >65.00* >65.00*   Hemoglobin A1C  Recent Labs  01/10/15 1730  HGBA1C 5.8*   Fasting Lipid Panel  Recent Labs  01/10/15 1730  CHOL 230*  HDL 47  LDLCALC 169*  TRIG 69  CHOLHDL 4.9   Disposition   Pt is being discharged home today in good condition.  Follow-up Plans & Appointments    Follow-up Information    Follow up with KELLY,THOMAS A, MD In 1 week.   Specialty:  Cardiology   Why:  we will arrange for follow-up and contact you.   Contact information:   [address] [place] [address] [phone]       Discharge Medications   Current Discharge Medication List    CONTINUE these medications which have CHANGED   Details  lisinopril (PRINIVIL,ZESTRIL) 5 MG tablet Take 1 tablet (5 mg total) by mouth daily. Qty: 30 tablet, Refills: 6    nitroGLYCERIN (NITROSTAT) 0.4 MG SL tablet Place 1 tablet (0.4 mg total) under the tongue every 5 (five) minutes as needed for chest pain (up to 3 doses). Qty: 25 tablet, Refills: 3    ticagrelor (BRILINTA) 90 MG TABS tablet Take 1 tablet (90 mg total) by mouth 2 (two) times daily. Qty: 60 tablet, Refills: 6      CONTINUE these medications which have NOT CHANGED   Details  aspirin EC 81 MG tablet Take 81 mg by mouth daily. Reported on 01/11/2015    DimenhyDRINATE (MOTION SICKNESS PO) Take 1  tablet by mouth daily. Reported on 01/11/2015      STOP taking these medications     atorvastatin (LIPITOR) 80 MG tablet      ondansetron (ZOFRAN ODT) 4 MG disintegrating tablet      promethazine (PHENERGAN) 25 MG tablet          Aspirin prescribed at discharge:  Yes High Intensity Statin Prescribed? (Lipitor 40-80mg  or Crestor 20-40mg ): No: Pt unable to afford - pravastatin 40 Rx. Beta Blocker Prescribed: No: relative bradycardia and inferior MI ADP Receptor Inhibitor Prescribed? (i.e. Plavix etc.-Includes Medically Managed Patients): Yes For EF <40%, Aldosterone Inhibitor Prescribed? No: pt insisted on d/c prior to attempt at initiation. Was EF assessed during THIS hospitalization? Yes Was Cardiac Rehab II ordered? (Included Medically managed Patients): Yes   Outstanding Labs/Studies   None  Duration of Discharge Encounter   Greater than 30 minutes including physician time.  Signed, Nicolasa Ducking NP 01/11/2015, 10:46 AM

## 2015-01-11 NOTE — Discharge Instructions (Signed)
**  PLEASE REMEMBER TO BRING ALL OF YOUR MEDICATIONS TO EACH OF YOUR FOLLOW-UP OFFICE VISITS. ° °NO HEAVY LIFTING X 4 WEEKS. °NO SEXUAL ACTIVITY X 4 WEEKS. °NO DRIVING X 2 WEEKS. °NO SOAKING BATHS, HOT TUBS, POOLS, ETC., X 7 DAYS.  ° °Groin Site Care °Refer to this sheet in the next few weeks. These instructions provide you with information on caring for yourself after your procedure. Your caregiver may also give you more specific instructions. Your treatment has been planned according to current medical practices, but problems sometimes occur. Call your caregiver if you have any problems or questions after your procedure. °HOME CARE INSTRUCTIONS °· You may shower 24 hours after the procedure. Remove the bandage (dressing) and gently wash the site with plain soap and water. Gently pat the site dry.  °· Do not apply powder or lotion to the site.  °· Do not sit in a bathtub, swimming pool, or whirlpool for 5 to 7 days.  °· No bending, squatting, or lifting anything over 10 pounds (4.5 kg) as directed by your caregiver.  °· Inspect the site at least twice daily.  °· Do not drive home if you are discharged the same day of the procedure. Have someone else drive you.  ° °What to expect: °· Any bruising will usually fade within 1 to 2 weeks.  °· Blood that collects in the tissue (hematoma) may be painful to the touch. It should usually decrease in size and tenderness within 1 to 2 weeks.  °SEEK IMMEDIATE MEDICAL CARE IF: °· You have unusual pain at the groin site or down the affected leg.  °· You have redness, warmth, swelling, or pain at the groin site.  °· You have drainage (other than a small amount of blood on the dressing).  °· You have chills.  °· You have a fever or persistent symptoms for more than 72 hours.  °· You have a fever and your symptoms suddenly get worse.  °· Your leg becomes pale, cool, tingly, or numb.  °You have heavy bleeding from the site. Hold pressure on the site.  °_____________ ° °  ° °10 Habits  of Highly Healthy People ° °[place] wants to help you get well and stay well.  Live a longer, healthier life by practicing healthy habits every day. ° °1.  Visit your primary care provider regularly. °2.  Make time for family and friends.  Healthy relationships are important. °3.  Take medications as directed by your provider. °4.  Maintain a healthy weight and a trim waistline. °5.  Eat healthy meals and snacks, rich in fruits, vegetables, whole grains, and lean proteins. °6.  Get moving every day - aim for 150 minutes of moderate physical activity each week. °7.  Don't smoke. °8.  Avoid alcohol or drink in moderation. °9.  Manage stress through meditation or mindful relaxation. °10.  Get seven to nine hours of quality sleep each night. ° °Want more information on healthy habits?  To learn more about these and other healthy habits, visit [email or web address]. °_____________ °  °  ° °

## 2015-01-11 NOTE — Progress Notes (Signed)
Discharge instructions reviewed with patient. Patient verbalized understanding. Patient discharged and does not want wheelchair or staff assistance. Patient ambulated out of unit independently.

## 2015-01-14 ENCOUNTER — Encounter (HOSPITAL_COMMUNITY): Payer: Self-pay | Admitting: Cardiology

## 2015-01-24 ENCOUNTER — Telehealth: Payer: Self-pay | Admitting: *Deleted

## 2015-01-24 NOTE — Telephone Encounter (Signed)
Faxed phase 2 cardiac rehab order to [place]. 

## 2015-03-04 ENCOUNTER — Encounter (HOSPITAL_COMMUNITY): Payer: Self-pay

## 2015-03-04 ENCOUNTER — Emergency Department (HOSPITAL_COMMUNITY)
Admission: EM | Admit: 2015-03-04 | Discharge: 2015-03-05 | Disposition: A | Discharge status: Other Acute Inpatient Hospital | Payer: Federal, State, Local not specified - Other | Attending: Emergency Medicine | Admitting: Emergency Medicine

## 2015-03-04 DIAGNOSIS — F17210 Nicotine dependence, cigarettes, uncomplicated: Secondary | ICD-10-CM | POA: Insufficient documentation

## 2015-03-04 DIAGNOSIS — Z9889 Other specified postprocedural states: Secondary | ICD-10-CM | POA: Insufficient documentation

## 2015-03-04 DIAGNOSIS — F29 Unspecified psychosis not due to a substance or known physiological condition: Secondary | ICD-10-CM | POA: Diagnosis present

## 2015-03-04 DIAGNOSIS — Z79899 Other long term (current) drug therapy: Secondary | ICD-10-CM | POA: Insufficient documentation

## 2015-03-04 DIAGNOSIS — Z7982 Long term (current) use of aspirin: Secondary | ICD-10-CM | POA: Insufficient documentation

## 2015-03-04 DIAGNOSIS — Z9861 Coronary angioplasty status: Secondary | ICD-10-CM | POA: Insufficient documentation

## 2015-03-04 DIAGNOSIS — R45851 Suicidal ideations: Secondary | ICD-10-CM | POA: Insufficient documentation

## 2015-03-04 DIAGNOSIS — F22 Delusional disorders: Secondary | ICD-10-CM | POA: Insufficient documentation

## 2015-03-04 DIAGNOSIS — F1210 Cannabis abuse, uncomplicated: Secondary | ICD-10-CM | POA: Insufficient documentation

## 2015-03-04 DIAGNOSIS — E785 Hyperlipidemia, unspecified: Secondary | ICD-10-CM | POA: Insufficient documentation

## 2015-03-04 DIAGNOSIS — I10 Essential (primary) hypertension: Secondary | ICD-10-CM | POA: Insufficient documentation

## 2015-03-04 DIAGNOSIS — F250 Schizoaffective disorder, bipolar type: Secondary | ICD-10-CM

## 2015-03-04 DIAGNOSIS — Z9119 Patient's noncompliance with other medical treatment and regimen: Secondary | ICD-10-CM | POA: Insufficient documentation

## 2015-03-04 DIAGNOSIS — Z7902 Long term (current) use of antithrombotics/antiplatelets: Secondary | ICD-10-CM | POA: Insufficient documentation

## 2015-03-04 LAB — COMPREHENSIVE METABOLIC PANEL
ALBUMIN: 4.1 g/dL (ref 3.5–5.0)
ALT: 35 U/L (ref 17–63)
AST: 25 U/L (ref 15–41)
Alkaline Phosphatase: 55 U/L (ref 38–126)
Anion gap: 15 (ref 5–15)
BUN: 10 mg/dL (ref 6–20)
CHLORIDE: 99 mmol/L — AB (ref 101–111)
CO2: 22 mmol/L (ref 22–32)
CREATININE: 1.28 mg/dL — AB (ref 0.61–1.24)
Calcium: 9.4 mg/dL (ref 8.9–10.3)
GFR calc Af Amer: >60 mL/min (ref >60)
GFR calc non Af Amer: >60 mL/min (ref >60)
Glucose, Bld: 100 mg/dL — ABNORMAL HIGH (ref 65–99)
POTASSIUM: 3.9 mmol/L (ref 3.5–5.1)
SODIUM: 136 mmol/L (ref 135–145)
Total Bilirubin: 1 mg/dL (ref 0.3–1.2)
Total Protein: 6.6 g/dL (ref 6.5–8.1)

## 2015-03-04 LAB — CBC
HCT: 45.1 % (ref 39.0–52.0)
HEMOGLOBIN: 15.6 g/dL (ref 13.0–17.0)
MCH: 32.1 pg (ref 26.0–34.0)
MCHC: 34.6 g/dL (ref 30.0–36.0)
MCV: 92.8 fL (ref 78.0–100.0)
Platelets: 226 10*3/uL (ref 150–400)
RBC: 4.86 MIL/uL (ref 4.22–5.81)
RDW: 15.2 % (ref 11.5–15.5)
WBC: 4.1 10*3/uL (ref 4.0–10.5)

## 2015-03-04 LAB — RAPID URINE DRUG SCREEN, HOSP PERFORMED
AMPHETAMINES: NOT DETECTED
BENZODIAZEPINES: NOT DETECTED
Barbiturates: NOT DETECTED
Cocaine: NOT DETECTED
OPIATES: NOT DETECTED
TETRAHYDROCANNABINOL: POSITIVE — AB

## 2015-03-04 LAB — ACETAMINOPHEN LEVEL: Acetaminophen (Tylenol), Serum: <10 ug/mL — ABNORMAL LOW (ref 10–30)

## 2015-03-04 LAB — ETHANOL: Alcohol, Ethyl (B): <5 mg/dL (ref <5)

## 2015-03-04 LAB — SALICYLATE LEVEL: Salicylate Lvl: <4 mg/dL (ref 2.8–30.0)

## 2015-03-04 MED ORDER — ONDANSETRON HCL 4 MG PO TABS
4.0000 mg | ORAL_TABLET | Freq: Three times a day (TID) | ORAL | Status: DC | PRN
  Administered 2015-03-04 – 2015-03-05 (×3): 4 mg via ORAL
  Filled 2015-03-04 (×3): qty 1

## 2015-03-04 MED ORDER — ASPIRIN EC 81 MG PO TBEC
81.0000 mg | DELAYED_RELEASE_TABLET | Freq: Every day | ORAL | Status: DC
  Administered 2015-03-04 – 2015-03-05 (×2): 81 mg via ORAL
  Filled 2015-03-04 (×2): qty 1

## 2015-03-04 MED ORDER — ALUM & MAG HYDROXIDE-SIMETH 200-200-20 MG/5ML PO SUSP: 30.0000 mL | ORAL | Status: DC | PRN

## 2015-03-04 MED ORDER — NITROGLYCERIN 0.4 MG SL SUBL: 0.4000 mg | SUBLINGUAL_TABLET | SUBLINGUAL | Status: DC | PRN

## 2015-03-04 MED ORDER — PRAVASTATIN SODIUM 40 MG PO TABS
40.0000 mg | ORAL_TABLET | Freq: Every day | ORAL | Status: DC
  Administered 2015-03-05: 40 mg via ORAL
  Filled 2015-03-04 (×2): qty 1

## 2015-03-04 MED ORDER — LORAZEPAM 1 MG PO TABS
1.0000 mg | ORAL_TABLET | Freq: Three times a day (TID) | ORAL | Status: DC | PRN
  Administered 2015-03-05 (×2): 1 mg via ORAL
  Filled 2015-03-04 (×2): qty 1

## 2015-03-04 MED ORDER — LISINOPRIL 5 MG PO TABS
5.0000 mg | ORAL_TABLET | Freq: Every day | ORAL | Status: DC
  Administered 2015-03-05: 5 mg via ORAL
  Filled 2015-03-04 (×2): qty 1

## 2015-03-04 MED ORDER — TICAGRELOR 90 MG PO TABS
90.0000 mg | ORAL_TABLET | Freq: Two times a day (BID) | ORAL | Status: DC
  Administered 2015-03-04 – 2015-03-05 (×3): 90 mg via ORAL
  Filled 2015-03-04 (×4): qty 1

## 2015-03-04 MED ORDER — ACETAMINOPHEN 325 MG PO TABS: 650.0000 mg | ORAL_TABLET | ORAL | Status: DC | PRN

## 2015-03-04 NOTE — ED Notes (Signed)
According to EMS, pt reports auditory hallucinations instructing him to kill himself and his girlfriend. Pt states a specific SI plan to "lay on the train tracks". Pt became nauseated on the ride here to [facility] ED and vomited. Pt is A+OX4, speaking in complete sentences, and ambulatory.

## 2015-03-04 NOTE — ED Notes (Signed)
Patient refused breakfast 

## 2015-03-04 NOTE — BH Assessment (Signed)
Per Dr. Jannifer Franklin patient meets criteria for inpatient hospitalization. Writer informed Dr. Freida Busman of the disposition.  TTS will seek placement.

## 2015-03-04 NOTE — BH Assessment (Signed)
[facility] Assessment Progress Note  Per Thedore Mins, MD, this pt requires psychiatric hospitalization at this time.  The following facilities have been contacted to seek placement for this pt, with results as noted:  Beds available, information sent, decision pending:  Pensions consultant [place] [facility]   At capacity:  [facility] [place], [place] Triage Specialist [phone]

## 2015-03-04 NOTE — BHH Counselor (Signed)
[facility] Assessment Progress Note  T/c rec'd from [facility] declining pt due to behavior.   Johny Shock. Ladona Ridgel, MS, NCC, LPCA Counselor

## 2015-03-04 NOTE — ED Notes (Signed)
Bed: [facility]PT2 Expected date:  Expected time:  Means of arrival:  Comments: EMS 40yo M SI / HI / hallucinations

## 2015-03-04 NOTE — BH Assessment (Addendum)
Tele Assessment Note   Patient is a 40 year old African American male that reports SI with a plan to lay down on a rail road track near his home.  Patient reports that voices are telling him to kill his girlfriend because she no longer loves him and she is trying to poison him.   Duty to warn has been established because the police came to the patient's home where the patient and his girlfriend reside.  Documentation in the epic chart reports that the police and EMS responded to a call in which the patient had a knife to her throat when he called EMS as well as police.  Patient reported that the voices then told him to hurt himself inside of her.   Patient reports that he has had 2 heart attacks recently and the stress of this precipitates these types of thoughts.  Patient reports that he has had 5 heart attacks since 2011.  Patient denies substance abuse.  However, his UDS is positive for marijuana.  Patient reports that he has not taken any psychiatric medications since 2013.    Patient denies physical, sexual or emotional abuse.  Diagnosis: Mood Disorder   Past Medical History:  Past Medical History  Diagnosis Date  . Bipolar 1 disorder (HCC)   . Hypertension   . Hyperlipidemia   . Hx of medication noncompliance   . Polysubstance abuse     a. h/o tobacco, marijuana and crack cocaine use. b. 07/2013: +UDS THC, neg for cocaine.  . Anxiety   . CAD S/P percutaneous coronary angioplasty     a. inferior STEMI s/p BMS to RCA 01/2007. b. NSTEMI 07/2013 s/p PTCA to RCA for restenosis (no stenting due to hx noncompliance). c. Transient inferior ST elevation (peak troponin 0.25) 01/2014 s/p PTCA/DES to prox RCA, PTCA/DES to distal RCA, EF 50%; d. 12/2014 Inf STEMI: LM nl,LAD min irregs, D1 60ost, LCX 45p, 31m, OM1/2/3 nl, RCA patent prox stent, 128m/d (3.0x32 Synergy DES), EF 35-45%.  Marland Kitchen NSVT (nonsustained ventricular tachycardia) (HCC)     a. Very brief run during 07/2013 admit for NSTEMI felt due to  MI.  . Ischemic cardiomyopathy     a. EF 40% in 2011, 60% in 2012. b. EF 55% by cath 07/2013. c. EF 50% by cath 01/2014; d. 12/2014 EF 35-45% by LV gram.  . Tobacco abuse     Past Surgical History  Procedure Laterality Date  . Femoral artery stent    . Coronary angioplasty with stent placement  02/05/2007    3.5x61mm Quantum non-DES to RCA (Dr. Nicki Guadalajara)  . Cardiac catheterization  01/16/2009    normal left main, Cfx with 2-OMs both w/minor irregularities, LAd with 20-30% mid region irregularities, ramus intermediate/optional diagonal with 60% osital narrowing, RCA with stent in distal portion w/20% prox in-stent stenosis (Dr. Mervyn Skeeters. Little)  . Cardiac catheterization  07/23/2013    two vessel obstructive CAD, occluded first diagonal, focal in-stent restenosis in distal RCA (Dr. Peter Swaziland)  . Left heart catheterization with coronary angiogram N/A 07/23/2013    Procedure: LEFT HEART CATHETERIZATION WITH CORONARY ANGIOGRAM;  Surgeon: Peter M Swaziland, MD;  Location: [facility] CATH LAB;  Service: Cardiovascular;  Laterality: N/A;  . Left and right heart catheterization with coronary angiogram N/A 01/14/2014    Procedure: LEFT AND RIGHT HEART CATHETERIZATION WITH CORONARY ANGIOGRAM;  Surgeon: Kathleene Hazel, MD;  Location: [facility] CATH LAB;  Service: Cardiovascular;  Laterality: N/A;  . Cardiac catheterization N/A 01/10/2015  Procedure: Left Heart Cath and Coronary Angiography;  Surgeon: Marykay Lex, MD;  Location: [facility] INVASIVE CV LAB;  Service: Cardiovascular;  Laterality: N/A;  . Cardiac catheterization N/A 01/10/2015    Procedure: Coronary Stent Intervention;  Surgeon: Marykay Lex, MD;  Location: [facility] INVASIVE CV LAB;  Service: Cardiovascular;  Laterality: N/A;    Family History:  Family History  Problem Relation Age of Onset  . CAD Mother   . Heart disease Mother   . Heart attack Mother   . CAD Sister     Social History:  reports that he has been smoking Cigarettes.  He has been smoking  about 0.25 packs per day. He does not have any smokeless tobacco history on file. He reports that he drinks alcohol. He reports that he uses illicit drugs (Marijuana).  Additional Social History:  Alcohol / Drug Use History of alcohol / drug use?: No history of alcohol / drug abuse  CIWA: CIWA-Ar BP: (!) 136/106 mmHg Pulse Rate: 69 COWS:    PATIENT STRENGTHS: (choose at least two) Average or above average intelligence Capable of independent living Communication skills  Allergies:  Allergies  Allergen Reactions  . Iodine Shortness Of Breath and Swelling  . Shellfish Allergy Anaphylaxis    All shellfish  . Contrast Media [Iodinated Diagnostic Agents] Nausea And Vomiting    Home Medications:  (Not in a hospital admission)  OB/GYN Status:  No LMP for male patient.  General Assessment Data Location of Assessment: [facility] TTS Assessment: In system Is this a Tele or Face-to-Face Assessment?: Face-to-Face Is this an Initial Assessment or a Re-assessment for this encounter?: Initial Assessment Marital status: Single Maiden name: NA Is patient pregnant?: No Pregnancy Status: No Living Arrangements: Spouse/significant other Can pt return to current living arrangement?: Yes Admission Status: Voluntary Is patient capable of signing voluntary admission?: Yes Referral Source: Self/Family/Friend Insurance type: Self Pay     Crisis Care Plan Living Arrangements: Spouse/significant other Legal Guardian:  (NA) Name of Psychiatrist: None Reported Name of Therapist: None Reported  Education Status Is patient currently in school?: No Current Grade: NA Highest grade of school patient has completed: NA Name of school: NA Contact person: NA  Risk to self with the past 6 months Suicidal Ideation: Yes-Currently Present Has patient been a risk to self within the past 6 months prior to admission? : Yes Suicidal Intent: Yes-Currently Present Has patient had any suicidal intent within  the past 6 months prior to admission? : Yes Is patient at risk for suicide?: Yes Suicidal Plan?: Yes-Currently Present Has patient had any suicidal plan within the past 6 months prior to admission? : Yes Specify Current Suicidal Plan: Lay on a rail road track Access to Means: Yes Specify Access to Suicidal Means: Rail road track What has been your use of drugs/alcohol within the last 12 months?: None Reported Previous Attempts/Gestures: Yes How many times?: 2 Other Self Harm Risks: None Reported Triggers for Past Attempts: None known Intentional Self Injurious Behavior: None Family Suicide History: No Recent stressful life event(s): Conflict (Comment), Loss (Comment), Job Loss, Financial Problems (Strained relationship with girlfriend) Persecutory voices/beliefs?: Yes Depression: Yes Depression Symptoms: Despondent, Insomnia, Tearfulness, Isolating, Fatigue, Guilt, Loss of interest in usual pleasures, Feeling worthless/self pity, Feeling angry/irritable Substance abuse history and/or treatment for substance abuse?: No (Patient denies) Suicide prevention information given to non-admitted patients: Yes  Risk to Others within the past 6 months Homicidal Ideation: Yes-Currently Present Does patient have any lifetime risk of violence toward  others beyond the six months prior to admission? : Yes (comment) Thoughts of Harm to Others: Yes-Currently Present Comment - Thoughts of Harm to Others: Voices are telling him to kill her Current Homicidal Intent: Yes-Currently Present Current Homicidal Plan: No Access to Homicidal Means: No Identified Victim: His girlfriend History of harm to others?: No Assessment of Violence: None Noted Violent Behavior Description: None Reported Does patient have access to weapons?: No Criminal Charges Pending?: No Does patient have a court date: No Is patient on probation?: No  Psychosis Hallucinations: Visual Delusions: None noted  Mental Status  Report Appearance/Hygiene: Disheveled, Body odor Eye Contact: Fair Motor Activity: Freedom of movement Speech: Logical/coherent Level of Consciousness: Alert Mood: Depressed, Anxious, Suspicious Affect: Anxious, Depressed Anxiety Level: Minimal Thought Processes: Relevant, Coherent Judgement: Unimpaired Orientation: Person, Place, Time, Situation Obsessive Compulsive Thoughts/Behaviors: None  Cognitive Functioning Concentration: Decreased Memory: Remote Intact, Recent Intact IQ: Average Insight: Fair Impulse Control: Fair Appetite: Fair Weight Loss: 0 Weight Gain: 0 Sleep: Decreased Total Hours of Sleep: 3 Vegetative Symptoms: None  ADLScreening [facility] Assessment Services) Patient's cognitive ability adequate to safely complete daily activities?: Yes Patient able to express need for assistance with ADLs?: Yes Independently performs ADLs?: Yes (appropriate for developmental age)  Prior Inpatient Therapy Prior Inpatient Therapy: Yes Prior Therapy Dates: 2013 Prior Therapy Facilty/Provider(s): [facility] Reason for Treatment: SI and OPsychosis  Prior Outpatient Therapy Prior Outpatient Therapy: Yes Prior Therapy Dates: 2013 Prior Therapy Facilty/Provider(s): [facility]  Reason for Treatment: Med Mgt Does patient have an ACCT team?: No Does patient have Intensive In-House Services?  : No Does patient have [facility] services? : No Does patient have P4CC services?: No  ADL Screening (condition at time of admission) Patient's cognitive ability adequate to safely complete daily activities?: Yes Patient able to express need for assistance with ADLs?: Yes Independently performs ADLs?: Yes (appropriate for developmental age)       Abuse/Neglect Assessment (Assessment to be complete while patient is alone) Physical Abuse: Denies Verbal Abuse: Denies Sexual Abuse: Denies Exploitation of patient/patient's resources: Denies Self-Neglect: Denies Values /  Beliefs Cultural Requests During Hospitalization: None Spiritual Requests During Hospitalization: None Consults Spiritual Care Consult Needed: No Social Work Consult Needed: No Merchant navy officer (For Healthcare) Does patient have an advance directive?: No Would patient like information on creating an advanced directive?: No - patient declined information    Additional Information 1:1 In Past 12 Months?: No CIRT Risk: No Elopement Risk: No Does patient have medical clearance?: Yes     Disposition: Per Dr. Jannifer Franklin patient meets criteria for inpatient hospitalization. Per, Inetta Fermo no beds at [facility]. TTS will seek placement    Disposition Initial Assessment Completed for this Encounter: Yes  Linton Rump 03/04/2015 8:39 AM

## 2015-03-04 NOTE — ED Provider Notes (Signed)
CSN: [id number]     Arrival date & time 03/04/15  0636 History   First MD Initiated Contact with Patient 03/04/15 [phone]     Chief Complaint  Patient presents with  . Medical Clearance  . Psychiatric Evaluation     (Consider location/radiation/quality/duration/timing/severity/associated sxs/prior Treatment) HPI Comments: Patient here complaining of suicidal ideations as well as homicidal ideations. Has had auditory hallucinations told him to hurt his girlfriend as well as hurt himself. History of suicide attempt in the past by walking into traffic. States that his plan today is to do the same. Was at home this morning and the voices told him to hurt his girlfriend. He had a knife to her throat when he called EMS as well as police. The voices then told him to hurt himself inside of her. Patient states that he has had 2 heart attacks and the stress of this precipitates these type of thoughts Denies any alcohol or drug use. Does not take any psychiatric medications at this time. No treatment used prior to arrival  The history is provided by the patient.    Past Medical History  Diagnosis Date  . Bipolar 1 disorder (HCC)   . Hypertension   . Hyperlipidemia   . Hx of medication noncompliance   . Polysubstance abuse     a. h/o tobacco, marijuana and crack cocaine use. b. 07/2013: +UDS THC, neg for cocaine.  . Anxiety   . CAD S/P percutaneous coronary angioplasty     a. inferior STEMI s/p BMS to RCA 01/2007. b. NSTEMI 07/2013 s/p PTCA to RCA for restenosis (no stenting due to hx noncompliance). c. Transient inferior ST elevation (peak troponin 0.25) 01/2014 s/p PTCA/DES to prox RCA, PTCA/DES to distal RCA, EF 50%; d. 12/2014 Inf STEMI: LM nl,LAD min irregs, D1 60ost, LCX 45p, 74m, OM1/2/3 nl, RCA patent prox stent, 181m/d (3.0x32 Synergy DES), EF 35-45%.  Marland Kitchen NSVT (nonsustained ventricular tachycardia) (HCC)     a. Very brief run during 07/2013 admit for NSTEMI felt due to MI.  . Ischemic cardiomyopathy      a. EF 40% in 2011, 60% in 2012. b. EF 55% by cath 07/2013. c. EF 50% by cath 01/2014; d. 12/2014 EF 35-45% by LV gram.  . Tobacco abuse    Past Surgical History  Procedure Laterality Date  . Femoral artery stent    . Coronary angioplasty with stent placement  02/05/2007    3.5x71mm Quantum non-DES to RCA (Dr. Nicki Guadalajara)  . Cardiac catheterization  01/16/2009    normal left main, Cfx with 2-OMs both w/minor irregularities, LAd with 20-30% mid region irregularities, ramus intermediate/optional diagonal with 60% osital narrowing, RCA with stent in distal portion w/20% prox in-stent stenosis (Dr. Mervyn Skeeters. Little)  . Cardiac catheterization  07/23/2013    two vessel obstructive CAD, occluded first diagonal, focal in-stent restenosis in distal RCA (Dr. Peter Swaziland)  . Left heart catheterization with coronary angiogram N/A 07/23/2013    Procedure: LEFT HEART CATHETERIZATION WITH CORONARY ANGIOGRAM;  Surgeon: Peter M Swaziland, MD;  Location: [facility] CATH LAB;  Service: Cardiovascular;  Laterality: N/A;  . Left and right heart catheterization with coronary angiogram N/A 01/14/2014    Procedure: LEFT AND RIGHT HEART CATHETERIZATION WITH CORONARY ANGIOGRAM;  Surgeon: Kathleene Hazel, MD;  Location: [facility] CATH LAB;  Service: Cardiovascular;  Laterality: N/A;  . Cardiac catheterization N/A 01/10/2015    Procedure: Left Heart Cath and Coronary Angiography;  Surgeon: Marykay Lex, MD;  Location: [facility] INVASIVE CV LAB;  Service: Cardiovascular;  Laterality: N/A;  . Cardiac catheterization N/A 01/10/2015    Procedure: Coronary Stent Intervention;  Surgeon: Marykay Lex, MD;  Location: [facility] INVASIVE CV LAB;  Service: Cardiovascular;  Laterality: N/A;   Family History  Problem Relation Age of Onset  . CAD Mother   . Heart disease Mother   . Heart attack Mother   . CAD Sister    Social History  Substance Use Topics  . Smoking status: Current Every Day Smoker -- 0.25 packs/day    Types: Cigarettes  . Smokeless  tobacco: None  . Alcohol Use: 0.0 oz/week    0 Standard drinks or equivalent per week    Review of Systems  All other systems reviewed and are negative.     Allergies  Iodine; Shellfish allergy; and Contrast media  Home Medications   Prior to Admission medications   Medication Sig Start Date End Date Taking? Authorizing Provider  aspirin EC 81 MG tablet Take 81 mg by mouth daily. Reported on 01/11/2015    Historical Provider, MD  DimenhyDRINATE (MOTION SICKNESS PO) Take 1 tablet by mouth daily. Reported on 01/11/2015    Historical Provider, MD  lisinopril (PRINIVIL,ZESTRIL) 5 MG tablet Take 1 tablet (5 mg total) by mouth daily. 01/11/15   Ok Anis, NP  nitroGLYCERIN (NITROSTAT) 0.4 MG SL tablet Place 1 tablet (0.4 mg total) under the tongue every 5 (five) minutes as needed for chest pain (up to 3 doses). 01/11/15   Ok Anis, NP  pravastatin (PRAVACHOL) 40 MG tablet Take 1 tablet (40 mg total) by mouth daily. 01/11/15   Ok Anis, NP  ticagrelor (BRILINTA) 90 MG TABS tablet Take 1 tablet (90 mg total) by mouth 2 (two) times daily. 01/11/15   Ok Anis, NP   BP 136/106 mmHg  Pulse 69  Temp(Src) 98.1 F (36.7 C) (Oral)  Resp 20  SpO2 96% Physical Exam  Constitutional: He is oriented to person, place, and time. He appears well-developed and well-nourished.  Non-toxic appearance. No distress.  HENT:  Head: Normocephalic and atraumatic.  Eyes: Conjunctivae, EOM and lids are normal. Pupils are equal, round, and reactive to light.  Neck: Normal range of motion. Neck supple. No tracheal deviation present. No thyroid mass present.  Cardiovascular: Normal rate, regular rhythm and normal heart sounds.  Exam reveals no gallop.   No murmur heard. Pulmonary/Chest: Effort normal and breath sounds normal. No stridor. No respiratory distress. He has no decreased breath sounds. He has no wheezes. He has no rhonchi. He has no rales.  Abdominal: Soft.  Normal appearance and bowel sounds are normal. He exhibits no distension. There is no tenderness. There is no rebound and no CVA tenderness.  Musculoskeletal: Normal range of motion. He exhibits no edema or tenderness.  Neurological: He is alert and oriented to person, place, and time. He has normal strength. No cranial nerve deficit or sensory deficit. GCS eye subscore is 4. GCS verbal subscore is 5. GCS motor subscore is 6.  Skin: Skin is warm and dry. No abrasion and no rash noted.  Psychiatric: His speech is normal. His affect is blunt. He is slowed. Thought content is delusional. He expresses suicidal ideation. He expresses suicidal plans.  Nursing note and vitals reviewed.   ED Course  Procedures (including critical care time) Labs Review Labs Reviewed  CBC  COMPREHENSIVE METABOLIC PANEL  ETHANOL  SALICYLATE LEVEL  ACETAMINOPHEN LEVEL  URINE RAPID DRUG SCREEN, HOSP PERFORMED    Imaging Review  No results found. I have personally reviewed and evaluated these images and lab results as part of my medical decision-making.   EKG Interpretation None      MDM   Final diagnoses:  None    Patient denies any symptoms of chest pain or shortness of breath at this time. Continues to have an acute psychiatric emergency will consult TTS for placement    Lorre Nick, MD 03/04/15 [phone]

## 2015-03-04 NOTE — ED Notes (Signed)
Pt reports he is here d/t "I wanted to kill myself and my girlfriend, when I think about it, I get sick, I have been thinking about it the past 3-5 days, and I feel haunted. Pt reports he currently has thoughts of hurting himself. "If you let me out of here, I will lay on the tracks and let the train run over my head." Pt reports HI towards his girlfriend. Pt reports he wants to kill his girlfriend,that the voices are saying he is wasting his time and that she is poisoning him and cheating on him after 17 years of being together. Pt endorsing auditory and visual hallucinations. "The only time I get peace is when I sleep , I cant control my emotions." Pt reports he started hearing voices after his second heart attack 4-5 years ago, resulting in inpatient hospitalization. Pt reports he had a heart attack in December and now he is haunted. "They are here and not going anywhere. Pt reports he is technically homeless. Pt reports he stays with his girlfriend sometimes and stays outside sometimes. Sperical checks q 15 mins in place for safety. Will continue to monitor.

## 2015-03-05 ENCOUNTER — Encounter (HOSPITAL_COMMUNITY): Payer: Self-pay | Admitting: Emergency Medicine

## 2015-03-05 ENCOUNTER — Inpatient Hospital Stay (HOSPITAL_COMMUNITY)
Admission: AD | Admit: 2015-03-05 | Discharge: 2015-03-11 | DRG: 885 | Disposition: A | Discharge status: Home / Self Care | Payer: Federal, State, Local not specified - Other | Source: Intra-hospital | Attending: Psychiatry | Admitting: Psychiatry

## 2015-03-05 ENCOUNTER — Encounter (HOSPITAL_COMMUNITY): Payer: Self-pay

## 2015-03-05 DIAGNOSIS — E785 Hyperlipidemia, unspecified: Secondary | ICD-10-CM | POA: Diagnosis present

## 2015-03-05 DIAGNOSIS — F29 Unspecified psychosis not due to a substance or known physiological condition: Secondary | ICD-10-CM | POA: Diagnosis not present

## 2015-03-05 DIAGNOSIS — R45850 Homicidal ideations: Secondary | ICD-10-CM | POA: Diagnosis present

## 2015-03-05 DIAGNOSIS — R45851 Suicidal ideations: Secondary | ICD-10-CM | POA: Diagnosis present

## 2015-03-05 DIAGNOSIS — Z91013 Allergy to seafood: Secondary | ICD-10-CM

## 2015-03-05 DIAGNOSIS — G4700 Insomnia, unspecified: Secondary | ICD-10-CM | POA: Diagnosis present

## 2015-03-05 DIAGNOSIS — Z91041 Radiographic dye allergy status: Secondary | ICD-10-CM

## 2015-03-05 DIAGNOSIS — Z955 Presence of coronary angioplasty implant and graft: Secondary | ICD-10-CM

## 2015-03-05 DIAGNOSIS — F250 Schizoaffective disorder, bipolar type: Secondary | ICD-10-CM | POA: Diagnosis not present

## 2015-03-05 DIAGNOSIS — G4710 Hypersomnia, unspecified: Secondary | ICD-10-CM | POA: Diagnosis present

## 2015-03-05 DIAGNOSIS — F17210 Nicotine dependence, cigarettes, uncomplicated: Secondary | ICD-10-CM | POA: Diagnosis present

## 2015-03-05 DIAGNOSIS — Z590 Homelessness: Secondary | ICD-10-CM | POA: Diagnosis not present

## 2015-03-05 DIAGNOSIS — F419 Anxiety disorder, unspecified: Secondary | ICD-10-CM | POA: Diagnosis present

## 2015-03-05 DIAGNOSIS — I2510 Atherosclerotic heart disease of native coronary artery without angina pectoris: Secondary | ICD-10-CM | POA: Diagnosis present

## 2015-03-05 DIAGNOSIS — I252 Old myocardial infarction: Secondary | ICD-10-CM | POA: Diagnosis not present

## 2015-03-05 DIAGNOSIS — Z8249 Family history of ischemic heart disease and other diseases of the circulatory system: Secondary | ICD-10-CM

## 2015-03-05 DIAGNOSIS — Z818 Family history of other mental and behavioral disorders: Secondary | ICD-10-CM | POA: Diagnosis not present

## 2015-03-05 DIAGNOSIS — R112 Nausea with vomiting, unspecified: Secondary | ICD-10-CM | POA: Diagnosis present

## 2015-03-05 DIAGNOSIS — Z9114 Patient's other noncompliance with medication regimen: Secondary | ICD-10-CM | POA: Diagnosis not present

## 2015-03-05 MED ORDER — PRAVASTATIN SODIUM 40 MG PO TABS
40.0000 mg | ORAL_TABLET | Freq: Every day | ORAL | Status: DC
  Administered 2015-03-06 – 2015-03-11 (×6): 40 mg via ORAL
  Filled 2015-03-05 (×5): qty 1
  Filled 2015-03-05: qty 7
  Filled 2015-03-05 (×5): qty 1

## 2015-03-05 MED ORDER — MAGNESIUM HYDROXIDE 400 MG/5ML PO SUSP: 30.0000 mL | Freq: Every day | ORAL | Status: DC | PRN

## 2015-03-05 MED ORDER — TICAGRELOR 90 MG PO TABS
90.0000 mg | ORAL_TABLET | Freq: Two times a day (BID) | ORAL | Status: DC
  Administered 2015-03-05 – 2015-03-11 (×11): 90 mg via ORAL
  Filled 2015-03-05: qty 1
  Filled 2015-03-05: qty 14
  Filled 2015-03-05 (×4): qty 1
  Filled 2015-03-05: qty 14
  Filled 2015-03-05 (×13): qty 1

## 2015-03-05 MED ORDER — ACETAMINOPHEN 325 MG PO TABS: 650.0000 mg | ORAL_TABLET | Freq: Four times a day (QID) | ORAL | Status: DC | PRN

## 2015-03-05 MED ORDER — ACETAMINOPHEN 325 MG PO TABS
650.0000 mg | ORAL_TABLET | ORAL | Status: DC | PRN
Start: 2015-03-05 — End: 2015-03-05

## 2015-03-05 MED ORDER — NITROGLYCERIN 0.4 MG SL SUBL
0.4000 mg | SUBLINGUAL_TABLET | SUBLINGUAL | Status: DC | PRN
Start: 2015-03-05 — End: 2015-03-11

## 2015-03-05 MED ORDER — ASPIRIN EC 81 MG PO TBEC
81.0000 mg | DELAYED_RELEASE_TABLET | Freq: Every day | ORAL | Status: DC
  Administered 2015-03-06 – 2015-03-11 (×6): 81 mg via ORAL
  Filled 2015-03-05 (×5): qty 1
  Filled 2015-03-05: qty 7
  Filled 2015-03-05 (×4): qty 1

## 2015-03-05 MED ORDER — LORAZEPAM 1 MG PO TABS
1.0000 mg | ORAL_TABLET | Freq: Three times a day (TID) | ORAL | Status: DC | PRN
  Administered 2015-03-06: 1 mg via ORAL
  Filled 2015-03-05: qty 1

## 2015-03-05 MED ORDER — ALUM & MAG HYDROXIDE-SIMETH 200-200-20 MG/5ML PO SUSP: 30.0000 mL | ORAL | Status: DC | PRN

## 2015-03-05 MED ORDER — ONDANSETRON HCL 4 MG PO TABS
4.0000 mg | ORAL_TABLET | Freq: Three times a day (TID) | ORAL | Status: DC | PRN
Start: 2015-03-05 — End: 2015-03-11
  Administered 2015-03-06 (×2): 4 mg via ORAL
  Filled 2015-03-05 (×2): qty 1

## 2015-03-05 MED ORDER — LISINOPRIL 5 MG PO TABS
5.0000 mg | ORAL_TABLET | Freq: Every day | ORAL | Status: DC
  Administered 2015-03-06 – 2015-03-11 (×6): 5 mg via ORAL
  Filled 2015-03-05 (×4): qty 1
  Filled 2015-03-05: qty 7
  Filled 2015-03-05 (×7): qty 1

## 2015-03-05 MED ORDER — ALUM & MAG HYDROXIDE-SIMETH 200-200-20 MG/5ML PO SUSP
30.0000 mL | ORAL | Status: DC | PRN
Start: 2015-03-05 — End: 2015-03-05

## 2015-03-05 NOTE — ED Notes (Signed)
Pt given peanut butter,crackers, and Gatorade, pt ate them without problem. Reports he has not vomit since receiving Ativan this morning. Will continue to monitor.

## 2015-03-05 NOTE — ED Notes (Addendum)
Pt refusing to eat dinner at this time, pt reports he is full. Pt reports he ate  half of his lunch and is not hungry at this time. Will continue to monitor.

## 2015-03-05 NOTE — ED Notes (Signed)
Pt presents laying in bed, eyes close, appears to be sleeping. Breathing non labored, respirations equal, will continue to monitor.

## 2015-03-05 NOTE — ED Notes (Signed)
Pt reports to this nurse "I'm not suicidal as long as I am in the hospital" but reports these thoughts come and go. Pt reports he still  hears voices sometimes telling him to hurt his wife. Will continue to monitor on special checks q 15 mins for safety.

## 2015-03-05 NOTE — ED Notes (Signed)
Patient states that he has had 2 recent heart attacks and has been depressed and hearing voices since. He has been diagnosed with bipolar disorder but no psychosis as such. Currently he reports that the voices are intense, insistent and he cannot ignore them They were telling him to kill his girlfriend and also to kill himself. The voices were also telling him to vomit which he has been doing. Encouragement and support provided and safety maintain.

## 2015-03-05 NOTE — BH Assessment (Addendum)
[facility] Assessment Progress Note  Per Carolanne Grumbling, MD, this pt requires psychiatric hospitalization at this time.  Rosey Bath, RN, [facility] has assigned pt to [facility] Rm 503-1.  Pt has signed Voluntary Admission and Consent for Treatment, as well as Consent to Release Information to family members, and signed forms have been faxed to [facility].  Pt's nurse, Morrie Sheldon, has been notified, and agrees to send original paperwork along with pt via Juel Burrow, and to call report to [phone].  Doylene Canning, MA Triage Specialist [phone]   Addendum:  This Clinical research associate received a call from Joslyn Devon, Charity fundraiser, at [facility].  The assigned bed is no longer available.  She advises me to continue seeking placement for this pt.  See subsequent note for further referral information.  Doylene Canning, MA Triage Specialist [phone]

## 2015-03-05 NOTE — ED Notes (Signed)
Pt transported to [facility] by [facility] transportation service for continuation of specialized care. Belongings given to driver after patient signed for them. Pt left in no acute distress. 

## 2015-03-05 NOTE — BH Assessment (Signed)
[facility] Assessment Progress Note  The following facilities have been contacted to seek placement for this pt, with results as noted:  Beds available, information sent, decision pending:  [facility]   At capacity:  [facility] [place] Triage Specialist [phone]

## 2015-03-05 NOTE — ED Notes (Signed)
Pt c/o increase in anxiety. Pt reports "You don't know my thoughts, I just want to run from the spiders" Will continue to monitor.

## 2015-03-05 NOTE — ED Notes (Signed)
Pt c/o anxiety, Reports the "voices" are making him vomit. Small amount of vomit noted in emesis bag. Will continue to monitor.

## 2015-03-05 NOTE — Consult Note (Signed)
[facility] Consult   Reason for Consult:  Hallucinations telling him to kill himself and others Referring Physician:  ED MD Patient Identification: [name] MRN:  [id number] Principal Diagnosis: <principal problem not specified> Diagnosis:   Patient Active Problem List   Diagnosis Date Noted  . Psychotic disorder [F29] 03/05/2015    Priority: High    Class: Chronic  . Hypertensive heart disease [I11.9] 01/11/2015  . ST elevation myocardial infarction (STEMI) of inferior wall ([place]) [I21.19] 01/10/2015  . ST elevation myocardial infarction (STEMI) of inferior wall, initial episode of care ([place]) [I21.19] 01/10/2015  . Medical non-compliance [Z91.19]   . STEMI (ST elevation myocardial infarction) ([place]) [I21.3] 01/15/2014  . Hx of medication noncompliance [Z91.19]   . NSVT (nonsustained ventricular tachycardia) ([place]) [I47.2]   . Ischemic cardiomyopathy [I25.5]   . Tobacco abuse [Z72.0]   . Anxiety [F41.9]   . 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 DES PCI to RCA x 2 (3.0 mm x 22 mm Resolute - distal & proximal RCA) [Z95.5] 01/14/2014    Class: Present on Admission  . Non-STEMI (non-ST elevated myocardial infarction) ([place]) [I21.4] 07/22/2013  . Atypical chest pain [R07.89] 07/21/2013  . Bipolar 1 disorder, depressed ([place]) [F31.9] 07/21/2013  . Chest pain [R07.9] 04/16/2013  . HLD (hyperlipidemia) [E78.5] 04/16/2013  . Hyperlipidemia with target LDL less than 70 [E78.5] 01/27/2009  . SUBSTANCE ABUSE [F19.10] 01/27/2009  . DEPRESSION [F32.9] 01/27/2009  . Essential hypertension [I10] 01/27/2009  . CAD S/P BMS PCI to RCA with PTCA for ISR --> followed by stent thrombosis - DES PCI [I25.10, Z98.61] 01/27/2009  . GASTRITIS [K29.70, K29.90] 01/27/2009    Total Time spent with patient: 30 minutes  Subjective:   [name] is a 40 y.o. male patient admitted with suicidal threats responding to voices.  HPI:  [name] said he has had 2 recent  heart attacks and has been depressed and hearing voices since.  He has been diagnosed with bipolar disorder but no psychosis as such.  Currently he reports that the voices are intense, insistent and he cannot ignore them  They were telling him to kill [name] and also to kill himself.  Says if discharged he would try to kill himself.  The voices were also telling him to vomit which he has been doing.  Took lorazepam and stopped vomiting.  Voices less intense this morning but still present he says.    Past Psychiatric History: was inpatient psychiatry in 2013 and has been followed outpatient  Risk to Self: Suicidal Ideation: Yes-Currently Present Suicidal Intent: Yes-Currently Present Is patient at risk for suicide?: Yes Suicidal Plan?: Yes-Currently Present Specify Current Suicidal Plan: Lay on a rail road track Access to Means: Yes Specify Access to Suicidal Means: Rail road track What has been your use of drugs/alcohol within the last 12 months?: None Reported How many times?: 2 Other Self Harm Risks: None Reported Triggers for Past Attempts: None known Intentional Self Injurious Behavior: None Risk to Others: Homicidal Ideation: Yes-Currently Present Thoughts of Harm to Others: Yes-Currently Present Comment - Thoughts of Harm to Others: Voices are telling him to kill her Current Homicidal Intent: Yes-Currently Present Current Homicidal Plan: No Access to Homicidal Means: No Identified Victim: [name] History of harm to others?: No Assessment of Violence: None Noted Violent Behavior Description: None Reported Does patient have access to weapons?: No Criminal Charges Pending?: No Does patient have a court date: No Prior Inpatient Therapy: Prior Inpatient Therapy: Yes  Prior Therapy Dates: 2013 Prior Therapy Facilty/Provider(s): [facility] Reason for Treatment: SI and OPsychosis Prior Outpatient Therapy: Prior Outpatient Therapy: Yes Prior Therapy Dates:  2013 Prior Therapy Facilty/Provider(s): [facility]  Reason for Treatment: Med Mgt Does patient have an ACCT team?: No Does patient have Intensive In-House Services?  : No Does patient have [facility] services? : No Does patient have P4CC services?: No  Past Medical History:  Past Medical History  Diagnosis Date  . Bipolar 1 disorder ([place])   . Hypertension   . Hyperlipidemia   . Hx of medication noncompliance   . Polysubstance abuse     a. h/o tobacco, marijuana and crack cocaine use. b. 07/2013: +UDS THC, neg for cocaine.  . Anxiety   . CAD S/P percutaneous coronary angioplasty     a. inferior STEMI s/p BMS to RCA 01/2007. b. NSTEMI 07/2013 s/p PTCA to RCA for restenosis (no stenting due to hx noncompliance). c. Transient inferior ST elevation (peak troponin 0.25) 01/2014 s/p PTCA/DES to prox RCA, PTCA/DES to distal RCA, EF 50%; d. 12/2014 Inf STEMI: LM nl,LAD min irregs, D1 60ost, LCX 45p, 54m OM1/2/3 nl, RCA patent prox stent, 109m (3.0x32 Synergy DES), EF 35-45%.  . Marland KitchenSVT (nonsustained ventricular tachycardia) ([place]    a. Very brief run during 07/2013 admit for NSTEMI felt due to MI.  . Ischemic cardiomyopathy     a. EF 40% in 2011, 60% in 2012. b. EF 55% by cath 07/2013. c. EF 50% by cath 01/2014; d. 12/2014 EF 35-45% by LV gram.  . Tobacco abuse     Past Surgical History  Procedure Laterality Date  . Femoral artery stent    . Coronary angioplasty with stent placement  02/05/2007    [id number] non-DES to RCA (Dr. ThoShelva Majestic. Cardiac catheterization  01/16/2009    normal left main, Cfx with 2-OMs both w/minor irregularities, LAd with 20-30% mid region irregularities, ramus intermediate/optional diagonal with 60% osital narrowing, RCA with stent in distal portion w/20% prox in-stent stenosis (Dr. A. Loni Museittle)  . Cardiac catheterization  07/23/2013    two vessel obstructive CAD, occluded first diagonal, focal in-stent restenosis in distal RCA (Dr. Peter JorMartinique. Left heart  catheterization with coronary angiogram N/A 07/23/2013    Procedure: LEFT HEART CATHETERIZATION WITH CORONARY ANGIOGRAM;  Surgeon: Peter M JorMartiniqueD;  [facility];  Service: Cardiovascular;  Laterality: N/A;  . Left and right heart catheterization with coronary angiogram N/A 01/14/2014    Procedure: LEFT AND RIGHT HEART CATHETERIZATION WITH CORONARY ANGIOGRAM;  Surgeon: ChrBurnell BlanksD;  [facility];  Service: Cardiovascular;  Laterality: N/A;  . Cardiac catheterization N/A 01/10/2015    Procedure: Left Heart Cath and Coronary Angiography;  Surgeon: DavLeonie ManD;  Location: [facility];  Service: Cardiovascular;  Laterality: N/A;  . Cardiac catheterization N/A 01/10/2015    Procedure: Coronary Stent Intervention;  Surgeon: DavLeonie ManD;  Location: [facility];  Service: Cardiovascular;  Laterality: N/A;   Family History:  Family History  Problem Relation Age of Onset  . CAD Mother   . Heart disease Mother   . Heart attack Mother   . CAD Sister    Family Psychiatric  History: none reported Social History:  History  Alcohol Use  . 0.0 oz/week  . 0 Standard drinks or equivalent per week     History  Drug Use  . Yes  . Special: Marijuana  Comment: Marijuana (last used on 01/07/2015)    Social History   Social History  . Marital Status: Single    Spouse Name: N/A  . Number of Children: N/A  . Years of Education: N/A   Social History Main Topics  . Smoking status: Current Every Day Smoker -- 0.25 packs/day    Types: Cigarettes  . Smokeless tobacco: None  . Alcohol Use: 0.0 oz/week    0 Standard drinks or equivalent per week  . Drug Use: Yes    Special: Marijuana     Comment: Marijuana (last used on 01/07/2015)  . Sexual Activity: Not Currently   Other Topics Concern  . None   Social History Narrative   Additional Social History:    Allergies:   Allergies  Allergen Reactions  . Iodine Shortness Of Breath  and Swelling  . Shellfish Allergy Anaphylaxis    All shellfish  . Contrast Media [Iodinated Diagnostic Agents] Nausea And Vomiting    Labs:  Results for orders placed or performed during the hospital encounter of 03/04/15 (from the past 48 hour(s))  Comprehensive metabolic panel     Status: Abnormal   Collection Time: 03/04/15  6:58 AM  Result Value Ref Range   Sodium 136 135 - 145 mmol/L   Potassium 3.9 3.5 - 5.1 mmol/L   Chloride 99 (L) 101 - 111 mmol/L   CO2 22 22 - 32 mmol/L   Glucose, Bld 100 (H) 65 - 99 mg/dL   BUN 10 6 - 20 mg/dL   Creatinine, Ser 1.28 (H) 0.61 - 1.24 mg/dL   Calcium 9.4 8.9 - 10.3 mg/dL   Total Protein 6.6 6.5 - 8.1 g/dL   Albumin 4.1 3.5 - 5.0 g/dL   AST 25 15 - 41 U/L   ALT 35 17 - 63 U/L   Alkaline Phosphatase 55 38 - 126 U/L   Total Bilirubin 1.0 0.3 - 1.2 mg/dL   GFR calc non Af Amer >60 >60 mL/min   GFR calc Af Amer >60 >60 mL/min    Comment: (NOTE) The eGFR has been calculated using the CKD EPI equation. This calculation has not been validated in all clinical situations. eGFR's persistently <60 mL/min signify possible Chronic Kidney Disease.    Anion gap 15 5 - 15  Ethanol (ETOH)     Status: None   Collection Time: 03/04/15  6:58 AM  Result Value Ref Range   Alcohol, Ethyl (B) <5 <5 mg/dL    Comment:        LOWEST DETECTABLE LIMIT FOR SERUM ALCOHOL IS 5 mg/dL FOR MEDICAL PURPOSES ONLY   Salicylate level     Status: None   Collection Time: 03/04/15  6:58 AM  Result Value Ref Range   Salicylate Lvl <2.8 2.8 - 30.0 mg/dL  Acetaminophen level     Status: Abnormal   Collection Time: 03/04/15  6:58 AM  Result Value Ref Range   Acetaminophen (Tylenol), Serum <10 (L) 10 - 30 ug/mL    Comment:        THERAPEUTIC CONCENTRATIONS VARY SIGNIFICANTLY. A RANGE OF 10-30 ug/mL MAY BE AN EFFECTIVE CONCENTRATION FOR MANY PATIENTS. HOWEVER, SOME ARE BEST TREATED AT CONCENTRATIONS OUTSIDE THIS RANGE. ACETAMINOPHEN CONCENTRATIONS >150 ug/mL AT 4  HOURS AFTER INGESTION AND >50 ug/mL AT 12 HOURS AFTER INGESTION ARE OFTEN ASSOCIATED WITH TOXIC REACTIONS.   CBC     Status: None   Collection Time: 03/04/15  6:58 AM  Result Value Ref Range   WBC 4.1 4.0 -  10.5 K/uL   RBC 4.86 4.22 - 5.81 MIL/uL   Hemoglobin 15.6 13.0 - 17.0 g/dL   HCT 45.1 39.0 - 52.0 %   MCV 92.8 78.0 - 100.0 fL   MCH 32.1 26.0 - 34.0 pg   MCHC 34.6 30.0 - 36.0 g/dL   RDW 15.2 11.5 - 15.5 %   Platelets 226 150 - 400 K/uL  Urine rapid drug screen (hosp performed) (Not at [facility])     Status: Abnormal   Collection Time: 03/04/15  7:33 AM  Result Value Ref Range   Opiates NONE DETECTED NONE DETECTED   Cocaine NONE DETECTED NONE DETECTED   Benzodiazepines NONE DETECTED NONE DETECTED   Amphetamines NONE DETECTED NONE DETECTED   Tetrahydrocannabinol POSITIVE (A) NONE DETECTED   Barbiturates NONE DETECTED NONE DETECTED    Comment:        DRUG SCREEN FOR MEDICAL PURPOSES ONLY.  IF CONFIRMATION IS NEEDED FOR ANY PURPOSE, NOTIFY LAB WITHIN 5 DAYS.        LOWEST DETECTABLE LIMITS FOR URINE DRUG SCREEN Drug Class       Cutoff (ng/mL) Amphetamine      1000 Barbiturate      200 Benzodiazepine   497 Tricyclics       026 Opiates          300 Cocaine          300 THC              50     Current Facility-Administered Medications  Medication Dose Route Frequency Provider Last Rate Last Dose  . acetaminophen (TYLENOL) tablet 650 mg  650 mg Oral Q4H PRN Lacretia Leigh, MD      . alum & mag hydroxide-simeth (MAALOX/MYLANTA) 200-200-20 MG/5ML suspension 30 mL  30 mL Oral PRN Lacretia Leigh, MD      . aspirin EC tablet 81 mg  81 mg Oral Daily Lacretia Leigh, MD   81 mg at 03/05/15 0859  . lisinopril (PRINIVIL,ZESTRIL) tablet 5 mg  5 mg Oral Daily Lacretia Leigh, MD   5 mg at 03/05/15 0900  . LORazepam (ATIVAN) tablet 1 mg  1 mg Oral Q8H PRN Lacretia Leigh, MD   1 mg at 03/05/15 0754  . nitroGLYCERIN (NITROSTAT) SL tablet 0.4 mg  0.4 mg Sublingual Q5 min PRN Lacretia Leigh, MD       . ondansetron [facility]) tablet 4 mg  4 mg Oral Q8H PRN Lacretia Leigh, MD   4 mg at 03/05/15 0425  . pravastatin (PRAVACHOL) tablet 40 mg  40 mg Oral Daily Lacretia Leigh, MD   40 mg at 03/05/15 0859  . ticagrelor (BRILINTA) tablet 90 mg  90 mg Oral BID Lacretia Leigh, MD   90 mg at 03/05/15 0900   Current Outpatient Prescriptions  Medication Sig Dispense Refill  . aspirin EC 81 MG tablet Take 81 mg by mouth daily. Reported on 01/11/2015    . lisinopril (PRINIVIL,ZESTRIL) 5 MG tablet Take 1 tablet (5 mg total) by mouth daily. 30 tablet 6  . nitroGLYCERIN (NITROSTAT) 0.4 MG SL tablet Place 1 tablet (0.4 mg total) under the tongue every 5 (five) minutes as needed for chest pain (up to 3 doses). 25 tablet 3  . pravastatin (PRAVACHOL) 40 MG tablet Take 1 tablet (40 mg total) by mouth daily. 30 tablet 6  . ticagrelor (BRILINTA) 90 MG TABS tablet Take 1 tablet (90 mg total) by mouth 2 (two) times daily. 30 tablet 0    Musculoskeletal: Strength &  Muscle Tone: within normal limits Gait & Station: normal Patient leans: N/A  Psychiatric Specialty Exam: Review of Systems  HENT: Negative.   Eyes: Negative.   Respiratory: Negative.   Cardiovascular: Negative.   Gastrointestinal: Positive for nausea and vomiting.  Genitourinary: Negative.   Musculoskeletal: Positive for myalgias.  Skin: Negative.   Neurological: Positive for weakness.  Endo/Heme/Allergies: Negative.   Psychiatric/Behavioral: Positive for depression, suicidal ideas and hallucinations. The patient is nervous/anxious.     Blood pressure 118/86, pulse 90, temperature 98.1 F (36.7 C), temperature source Oral, resp. rate 16, SpO2 99 %.There is no weight on file to calculate BMI.  General Appearance: Fairly Groomed  Engineer, water::  Good  Speech:  Clear and Coherent  Volume:  Normal  Mood:  Depressed  Affect:  Appropriate  Thought Process:  Coherent  Orientation:  Full (Time, Place, and Person)  Thought Content:  Hallucinations:  Auditory Command:  telling him to kill [name] and himself  Suicidal Thoughts:  Yes.  with intent/plan  Homicidal Thoughts:  Yes.  without intent/plan  Memory:  Immediate;   Good Recent;   Good Remote;   Good  Judgement:  Intact  Insight:  Fair  Psychomotor Activity:  Normal  Concentration:  Good  Recall:  Good  Fund of Knowledge:Good  Language: Good  Akathisia:  Negative  Handed:  Right  0 AIMS  Assets:  Communication Skills Desire for Improvement Housing  ADL's:  Intact  Cognition: WNL  Sleep:      Treatment Plan Summary: Plan has been accepted at [facility] for treatment of Justin depression and psychosis  Disposition: Recommend psychiatric Inpatient admission when medically cleared.  Donnelly Angelica, MD 03/05/2015 1:23 PM

## 2015-03-06 ENCOUNTER — Encounter (HOSPITAL_COMMUNITY): Payer: Self-pay | Admitting: Psychiatry

## 2015-03-06 DIAGNOSIS — R112 Nausea with vomiting, unspecified: Secondary | ICD-10-CM | POA: Diagnosis present

## 2015-03-06 DIAGNOSIS — F250 Schizoaffective disorder, bipolar type: Secondary | ICD-10-CM | POA: Diagnosis present

## 2015-03-06 LAB — COMPREHENSIVE METABOLIC PANEL
ALBUMIN: 4.5 g/dL (ref 3.5–5.0)
ALT: 30 U/L (ref 17–63)
ANION GAP: 15 (ref 5–15)
AST: 32 U/L (ref 15–41)
Alkaline Phosphatase: 50 U/L (ref 38–126)
BUN: 8 mg/dL (ref 6–20)
CO2: 26 mmol/L (ref 22–32)
Calcium: 9.5 mg/dL (ref 8.9–10.3)
Chloride: 93 mmol/L — ABNORMAL LOW (ref 101–111)
Creatinine, Ser: 1.14 mg/dL (ref 0.61–1.24)
GFR calc Af Amer: >60 mL/min (ref >60)
GFR calc non Af Amer: >60 mL/min (ref >60)
GLUCOSE: 107 mg/dL — AB (ref 65–99)
POTASSIUM: 3.6 mmol/L (ref 3.5–5.1)
Sodium: 134 mmol/L — ABNORMAL LOW (ref 135–145)
Total Bilirubin: 1.4 mg/dL — ABNORMAL HIGH (ref 0.3–1.2)
Total Protein: 7.1 g/dL (ref 6.5–8.1)

## 2015-03-06 LAB — URINALYSIS, ROUTINE W REFLEX MICROSCOPIC
Glucose, UA: NEGATIVE mg/dL
HGB URINE DIPSTICK: NEGATIVE
Ketones, ur: 40 mg/dL — AB
Leukocytes, UA: NEGATIVE
NITRITE: NEGATIVE
PH: 6.5 (ref 5.0–8.0)
Protein, ur: 100 mg/dL — AB
SPECIFIC GRAVITY, URINE: 1.042 — AB (ref 1.005–1.030)

## 2015-03-06 LAB — CBC
HEMATOCRIT: 46.8 % (ref 39.0–52.0)
HEMOGLOBIN: 16 g/dL (ref 13.0–17.0)
MCH: 31.6 pg (ref 26.0–34.0)
MCHC: 34.2 g/dL (ref 30.0–36.0)
MCV: 92.5 fL (ref 78.0–100.0)
Platelets: 269 10*3/uL (ref 150–400)
RBC: 5.06 MIL/uL (ref 4.22–5.81)
RDW: 14.9 % (ref 11.5–15.5)
WBC: 5.9 10*3/uL (ref 4.0–10.5)

## 2015-03-06 LAB — URINE MICROSCOPIC-ADD ON

## 2015-03-06 LAB — LIPASE, BLOOD: Lipase: 22 U/L (ref 11–51)

## 2015-03-06 LAB — I-STAT TROPONIN, ED: Troponin i, poc: 0.02 ng/mL (ref 0.00–0.08)

## 2015-03-06 MED ORDER — SODIUM CHLORIDE 0.9 % IV BOLUS (SEPSIS)
1000.0000 mL | Freq: Once | INTRAVENOUS | Status: AC
  Administered 2015-03-06: 1000 mL via INTRAVENOUS

## 2015-03-06 MED ORDER — NICOTINE 21 MG/24HR TD PT24
21.0000 mg | MEDICATED_PATCH | Freq: Every day | TRANSDERMAL | Status: DC
  Administered 2015-03-06 – 2015-03-11 (×6): 21 mg via TRANSDERMAL
  Filled 2015-03-06 (×9): qty 1

## 2015-03-06 MED ORDER — CLONAZEPAM 0.5 MG PO TABS
0.5000 mg | ORAL_TABLET | Freq: Three times a day (TID) | ORAL | Status: DC
  Administered 2015-03-07 – 2015-03-11 (×13): 0.5 mg via ORAL
  Filled 2015-03-06 (×14): qty 1

## 2015-03-06 MED ORDER — ENSURE ENLIVE PO LIQD
237.0000 mL | Freq: Three times a day (TID) | ORAL | Status: DC
  Administered 2015-03-06 – 2015-03-11 (×10): 237 mL via ORAL
  Filled 2015-03-06 (×2): qty 237

## 2015-03-06 MED ORDER — OLANZAPINE 5 MG PO TBDP
2.5000 mg | ORAL_TABLET | Freq: Two times a day (BID) | ORAL | Status: DC
  Filled 2015-03-06 (×3): qty 0.5

## 2015-03-06 MED ORDER — OLANZAPINE 5 MG PO TBDP
5.0000 mg | ORAL_TABLET | Freq: Three times a day (TID) | ORAL | Status: DC | PRN
  Administered 2015-03-09: 5 mg via ORAL
  Filled 2015-03-06: qty 1

## 2015-03-06 MED ORDER — PROMETHAZINE HCL 25 MG/ML IJ SOLN
25.0000 mg | Freq: Three times a day (TID) | INTRAMUSCULAR | Status: DC | PRN
  Administered 2015-03-06: 25 mg via INTRAMUSCULAR
  Filled 2015-03-06 (×2): qty 1

## 2015-03-06 MED ORDER — OLANZAPINE 5 MG PO TBDP
5.0000 mg | ORAL_TABLET | Freq: Two times a day (BID) | ORAL | Status: DC
  Administered 2015-03-06 – 2015-03-07 (×2): 5 mg via ORAL
  Filled 2015-03-06 (×7): qty 1
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
## 2015-03-06 NOTE — [facility] Suicide Risk Assessment (Signed)
[facility] Admission Suicide Risk Assessment   Nursing information obtained from:    Demographic factors:    Current Mental Status:    Loss Factors:    Historical Factors:    Risk Reduction Factors:     Total Time spent with patient: 30 minutes Principal Problem: Schizoaffective disorder, bipolar type (HCC) Diagnosis:   Patient Active Problem List   Diagnosis Date Noted  . Schizoaffective disorder, bipolar type (HCC) [F25.0] 03/06/2015  . Nausea with vomiting [R11.2] 03/06/2015  . Hypertensive heart disease [I11.9] 01/11/2015  . ST elevation myocardial infarction (STEMI) of inferior wall (HCC) [I21.19] 01/10/2015  . ST elevation myocardial infarction (STEMI) of inferior wall, initial episode of care (HCC) [I21.19] 01/10/2015  . Medical non-compliance [Z91.19]   . STEMI (ST elevation myocardial infarction) (HCC) [I21.3] 01/15/2014  . Hx of medication noncompliance [Z91.19]   . NSVT (nonsustained ventricular tachycardia) (HCC) [I47.2]   . Ischemic cardiomyopathy [I25.5]   . Tobacco abuse [Z72.0]   . Anxiety [F41.9]   . 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 DES PCI to RCA x 2 (3.0 mm x 22 mm Resolute - distal & proximal RCA) [Z95.5] 01/14/2014    Class: Present on Admission  . Non-STEMI (non-ST elevated myocardial infarction) (HCC) [I21.4] 07/22/2013  . Atypical chest pain [R07.89] 07/21/2013  . Bipolar 1 disorder, depressed (HCC) [F31.9] 07/21/2013  . Chest pain [R07.9] 04/16/2013  . HLD (hyperlipidemia) [E78.5] 04/16/2013  . Hyperlipidemia with target LDL less than 70 [E78.5] 01/27/2009  . SUBSTANCE ABUSE [F19.10] 01/27/2009  . DEPRESSION [F32.9] 01/27/2009  . Essential hypertension [I10] 01/27/2009  . CAD S/P BMS PCI to RCA with PTCA for ISR --> followed by stent thrombosis - DES PCI [I25.10, Z98.61] 01/27/2009  . GASTRITIS [K29.70, K29.90] 01/27/2009   Subjective Data: Please see H&P.   Continued Clinical Symptoms:  Alcohol Use Disorder Identification Test Final  Score (AUDIT): 1 The "Alcohol Use Disorders Identification Test", Guidelines for Use in Primary Care, Second Edition.  World Science writer [facility]). Score between 0-7:  no or low risk or alcohol related problems. Score between 8-15:  moderate risk of alcohol related problems. Score between 16-19:  high risk of alcohol related problems. Score 20 or above:  warrants further diagnostic evaluation for alcohol dependence and treatment.   CLINICAL FACTORS:   Unstable or Poor Therapeutic Relationship Previous Psychiatric Diagnoses and Treatments Medical Diagnoses and Treatments/Surgeries   Musculoskeletal: Strength & Muscle Tone: within normal limits Gait & Station: normal Patient leans: N/A  Psychiatric Specialty Exam: Review of Systems  Psychiatric/Behavioral: Positive for depression and suicidal ideas. The patient is nervous/anxious.   All other systems reviewed and are negative.   Blood pressure 104/84, pulse 120, temperature 99 F (37.2 C), temperature source Oral, resp. rate 18, height 5' 9.5" (1.765 m), weight 50.803 kg (112 lb).Body mass index is 16.31 kg/(m^2).                  Please see H&P.                                       COGNITIVE FEATURES THAT CONTRIBUTE TO RISK:  Closed-mindedness, Polarized thinking and Thought constriction (tunnel vision)    SUICIDE RISK:   Severe:  Frequent, intense, and enduring suicidal ideation, specific plan, no subjective intent, but some objective markers of intent (i.e., choice of lethal method), the method is accessible, some limited preparatory behavior, evidence  of impaired self-control, severe dysphoria/symptomatology, multiple risk factors present, and few if any protective factors, particularly a lack of social support.  PLAN OF CARE: Please see H&P.   I certify that inpatient services furnished can reasonably be expected to improve the patient's condition.   Jevante Hollibaugh, MD 03/06/2015, 10:06 AM

## 2015-03-06 NOTE — Progress Notes (Signed)
NUTRITION ASSESSMENT  Pt identified as at risk on the Malnutrition Screen Tool  INTERVENTION: 1. Supplements: Ensure Enlive po TID, each supplement provides 350 kcal and 20 grams of protein  NUTRITION DIAGNOSIS: Unintentional weight loss related to sub-optimal intake as evidenced by pt report.   Goal: Pt to meet >/= 90% of their estimated nutrition needs.  Monitor:  PO intake  Assessment:  Pt admitted with depression, hallucinations and SI/HI. Per ED note, pt was refusing meals and c/o N/V. Pt is underweight. Per weight history, pt has lost 23 lb since July 2016 (17% wt loss x 7 months, significant for time frame). Pt would benefit from nutritional supplementation, especially since he is refusing meals. RD to order.   Height: Ht Readings from Last 1 Encounters:  03/05/15 5' 9.5" (1.765 m)    Weight: Wt Readings from Last 1 Encounters:  03/05/15 112 lb (50.803 kg)    Weight Hx: Wt Readings from Last 10 Encounters:  03/05/15 112 lb (50.803 kg)  01/10/15 125 lb 7.1 oz (56.9 kg)  08/09/14 135 lb (61.236 kg)  02/20/14 138 lb 6.4 oz (62.778 kg)  01/21/14 145 lb 12.8 oz (66.134 kg)  01/15/14 131 lb 2.8 oz (59.5 kg)  08/23/13 134 lb 11.2 oz (61.1 kg)  07/24/13 130 lb 1.1 oz (59 kg)  04/16/13 130 lb (58.968 kg)  02/05/13 137 lb (62.143 kg)    BMI:  Body mass index is 16.31 kg/(m^2). Pt meets criteria for underweight based on current BMI.  Estimated Nutritional Needs: Kcal: 25-30 kcal/kg Protein: > 1 gram protein/kg Fluid: 1 ml/kcal  Diet Order: Diet regular Room service appropriate?: Yes; Fluid consistency:: Thin Pt is also offered choice of unit snacks mid-morning and mid-afternoon.  Pt is eating as desired.   Lab results and medications reviewed.   Tilda Franco, MS, RD, LDN Pager: [phone] After Hours Pager: [phone]

## 2015-03-06 NOTE — Progress Notes (Signed)
At around 10 AM I was called by the nurse practitioner taking care of [name] asking me to consult on this patient with nausea, vomiting, and intermittent chest pains. Patient's chart was reviewed. Patient has a significant history of coronary artery disease with MI as recently as December. He, at that time, was found to have an in-stent restenosis. Revascularization was performed at that time. Patient's troponin peaked around 65. In view of this recent history patient needs cardiac workup in an expedited fashion to rule out a cardiac reason for his symptoms. This was communicated to the nurse practitioner and she was advised that the patient should be sent over to the emergency department.  I got a call again at around 1 PM again asking me to consult on this patient. Once again, considering all of the issues addressed above, this patient needs to be seen in the emergency department as he needs workup that cannot be performed in an expedited fashion at behavioral health. This was communicated very clearly to the nurse practitioner, as I had also done earlier in the day today. I also discussed with the psychiatrist on this case and gave same recommendation. I explained to them that this patient needs prompt attention to rule out acute coronary syndrome.  Osvaldo Shipper 03/06/2015

## 2015-03-06 NOTE — Progress Notes (Addendum)
Pt is a 40 yr old vol admitted for AH to kill himself and his GF. Pt reports that the [facility] are making him vomit as well. Pt became nauseated during the assessment. Pt did not vomit. Pt denies any illicit drug use. He reports rare alcohol use. Pt's uds was (+) for THC. ETOH <5. Pt been off his medications for 2 weeks. Pt reports that he started hearing voices after having a heart attack about 4-5 years ago. Per pt, he's been feeling "haunted" since his heart attack this past December. Pt oriented to the unit polices and procedures. Pt remains safe at this time with q68min checks.

## 2015-03-06 NOTE — Progress Notes (Signed)
1:1 Note: Patient maintained on constant supervision for intractable vomiting episodes, generalized weakness and discomfort with complain of dizziness.  Gait unsteady and appetite poor.  Staff supervision at bedside.  Fluid intake encouraged.

## 2015-03-06 NOTE — Progress Notes (Signed)
Dicussed with Dr Elna Breslow case of [name] vomiting.  Spoke with hospitalist Dr Rito Ehrlich and ER's Dr Effie Shy.  Recommended EKG and send to ED if patient is having chest pain.  Per Dr Effie Shy, patient may have slight chest pain from vomiting due to abdominal discomfort and acid reflux.      Spoke with patient and he reported slight intermittent chest discomfort but did say, "this is not the same pain as a heart attack pain."    EKG ordered.   1319 h  After consulting with Dr Elna Breslow pertaining to persistent nausea and vomiting episodes, patient is hereby transferred to [facility] for evaluation and possible treatment.  Patient is alert and oriented.

## 2015-03-06 NOTE — ED Notes (Signed)
Bed: RESB Expected date:  Expected time:  Means of arrival:  Comments: EMS-BH, cardiac work up

## 2015-03-06 NOTE — ED Provider Notes (Signed)
CSN: [id number]     Arrival date & time 03/06/15  1349 History   First MD Initiated Contact with Patient 03/06/15 1811     Chief Complaint  Patient presents with  . Emesis  . Abdominal Pain  . Medical Clearance     (Consider location/radiation/quality/duration/timing/severity/associated sxs/prior Treatment) HPI Patient sent to the Emergency Department for vomiting and abdominal pain. He is also having AV hallucinations of the patient killing himself and his girlfriend. Patient has PMH of CAD, Bipolar, Schizophrenia, Polysubstance abuse.  He states that the vomiting began 10 days ago and occurs after he put anything in his mouth and also after he hears the voices. He has associated RUQ pain. Has weakness and dizziness associated with his vomiting. He endorses night sweats and chills. The abdominal pain is sharp pain of gradual onset, that is intermittent. Nothing has alleviated the vomiting or abdominal pain. He has SOB associated with vomiting. He states that he sometimes sees red coloration in his vomit, but thinks that it is because he drinks red colored drinks. He endorses decreased appetite because he cannot keep anything down. The patient states that the voices are telling to kill himself, but also to kill his girlfriend. He states that they tell him to hit little kids as they run by. He also reports visual hallucinations of children running in front of him. He denies headache, diarrhea, constipation, syncope, chest pain, numbness, tingling, swelling, palpitations, vision changes, hematochezia, melena, dysuria, urinary frequency.  Past Medical History  Diagnosis Date  . Bipolar 1 disorder (HCC)   . Hypertension   . Hyperlipidemia   . Hx of medication noncompliance   . Polysubstance abuse     a. h/o tobacco, marijuana and crack cocaine use. b. 07/2013: +UDS THC, neg for cocaine.  . Anxiety   . CAD S/P percutaneous coronary angioplasty     a. inferior STEMI s/p BMS to RCA 01/2007. b. NSTEMI  07/2013 s/p PTCA to RCA for restenosis (no stenting due to hx noncompliance). c. Transient inferior ST elevation (peak troponin 0.25) 01/2014 s/p PTCA/DES to prox RCA, PTCA/DES to distal RCA, EF 50%; d. 12/2014 Inf STEMI: LM nl,LAD min irregs, D1 60ost, LCX 45p, 68m, OM1/2/3 nl, RCA patent prox stent, 131m/d (3.0x32 Synergy DES), EF 35-45%.  Marland Kitchen NSVT (nonsustained ventricular tachycardia) (HCC)     a. Very brief run during 07/2013 admit for NSTEMI felt due to MI.  . Ischemic cardiomyopathy     a. EF 40% in 2011, 60% in 2012. b. EF 55% by cath 07/2013. c. EF 50% by cath 01/2014; d. 12/2014 EF 35-45% by LV gram.  . Tobacco abuse    Past Surgical History  Procedure Laterality Date  . Femoral artery stent    . Coronary angioplasty with stent placement  02/05/2007    3.5x69mm Quantum non-DES to RCA (Dr. Nicki Guadalajara)  . Cardiac catheterization  01/16/2009    normal left main, Cfx with 2-OMs both w/minor irregularities, LAd with 20-30% mid region irregularities, ramus intermediate/optional diagonal with 60% osital narrowing, RCA with stent in distal portion w/20% prox in-stent stenosis (Dr. Mervyn Skeeters. [place])  . Cardiac catheterization  07/23/2013    two vessel obstructive CAD, occluded first diagonal, focal in-stent restenosis in distal RCA (Dr. Peter Swaziland)  . Left heart catheterization with coronary angiogram N/A 07/23/2013    Procedure: LEFT HEART CATHETERIZATION WITH CORONARY ANGIOGRAM;  Surgeon: Peter M Swaziland, MD;  Location: [facility] CATH LAB;  Service: Cardiovascular;  Laterality: N/A;  . Left and right  heart catheterization with coronary angiogram N/A 01/14/2014    Procedure: LEFT AND RIGHT HEART CATHETERIZATION WITH CORONARY ANGIOGRAM;  Surgeon: Kathleene Hazel, MD;  Location: [facility] CATH LAB;  Service: Cardiovascular;  Laterality: N/A;  . Cardiac catheterization N/A 01/10/2015    Procedure: Left Heart Cath and Coronary Angiography;  Surgeon: Marykay Lex, MD;  Location: [facility] INVASIVE CV LAB;  Service:  Cardiovascular;  Laterality: N/A;  . Cardiac catheterization N/A 01/10/2015    Procedure: Coronary Stent Intervention;  Surgeon: Marykay Lex, MD;  Location: [facility] INVASIVE CV LAB;  Service: Cardiovascular;  Laterality: N/A;   Family History  Problem Relation Age of Onset  . CAD Mother   . Heart disease Mother   . Heart attack Mother   . Schizophrenia Mother   . CAD Sister    Social History  Substance Use Topics  . Smoking status: Current Every Day Smoker -- 0.25 packs/day    Types: Cigarettes  . Smokeless tobacco: None  . Alcohol Use: 0.0 oz/week    0 Standard drinks or equivalent per week    Review of Systems  All other systems negative except as documented in the HPI. All pertinent positives and negatives as reviewed in the HPI.   Allergies  Iodine; Shellfish allergy; and Contrast media  Home Medications   Prior to Admission medications   Medication Sig Start Date End Date Taking? Authorizing Provider  aspirin EC 81 MG tablet Take 81 mg by mouth daily. Reported on 01/11/2015    Historical Provider, MD  lisinopril (PRINIVIL,ZESTRIL) 5 MG tablet Take 1 tablet (5 mg total) by mouth daily. 01/11/15   Ok Anis, NP  nitroGLYCERIN (NITROSTAT) 0.4 MG SL tablet Place 1 tablet (0.4 mg total) under the tongue every 5 (five) minutes as needed for chest pain (up to 3 doses). 01/11/15   Ok Anis, NP  pravastatin (PRAVACHOL) 40 MG tablet Take 1 tablet (40 mg total) by mouth daily. 01/11/15   Ok Anis, NP  ticagrelor (BRILINTA) 90 MG TABS tablet Take 1 tablet (90 mg total) by mouth 2 (two) times daily. 01/11/15   Ok Anis, NP   BP 103/69 mmHg  Pulse 79  Temp(Src) 98.8 F (37.1 C) (Oral)  Resp 16  Ht 5' 9.5" (1.765 m)  Wt 50.803 kg  BMI 16.31 kg/m2  SpO2 100% Physical Exam  Constitutional: He appears well-developed. No distress.  Patient is slightly cachectic  HENT:  Head: Normocephalic and atraumatic.  Right Ear: External ear normal.   Left Ear: External ear normal.  Nose: Nose normal.  Oropharynx is dry  Neck: Normal range of motion. Neck supple.  Cardiovascular: Normal rate, regular rhythm, normal heart sounds and intact distal pulses.  Exam reveals no gallop and no friction rub.   No murmur heard. Pulmonary/Chest: Effort normal and breath sounds normal. No respiratory distress. He has no wheezes. He has no rales. He exhibits no tenderness.  Abdominal: Soft. He exhibits no distension and no mass. There is tenderness (RUQ). There is no rebound and no guarding.  Skin: Skin is warm and dry. No rash noted. He is not diaphoretic. No erythema.  Psychiatric: His affect is blunt and labile (Patient endorses feeling of happiness alternating with extreme anger which leads to crying.). He expresses inappropriate judgment. He exhibits a depressed mood. He expresses homicidal and suicidal ideation.    ED Course  Procedures (including critical care time) Labs Review Labs Reviewed  COMPREHENSIVE METABOLIC PANEL - Abnormal; Notable for the following:  Sodium 134 (*)    Chloride 93 (*)    Glucose, Bld 107 (*)    Total Bilirubin 1.4 (*)    All other components within normal limits  URINALYSIS, ROUTINE W REFLEX MICROSCOPIC (NOT AT [facility]) - Abnormal; Notable for the following:    Color, Urine AMBER (*)    Specific Gravity, Urine 1.042 (*)    Bilirubin Urine MODERATE (*)    Ketones, ur 40 (*)    Protein, ur 100 (*)    All other components within normal limits  URINE MICROSCOPIC-ADD ON - Abnormal; Notable for the following:    Squamous Epithelial / LPF 0-5 (*)    Bacteria, UA RARE (*)    All other components within normal limits  LIPASE, BLOOD  CBC    Imaging Review No results found. I have personally reviewed and evaluated these images and lab results as part of my medical decision-making.    Patient has been stable here in the emergency department.  He feels better.  He has no symptoms at this time to give IV fluids.   Patient has a chronic history of this  Charlestine Night, PA-C 03/07/15 0981  Derwood Kaplan, MD 03/07/15 [phone]

## 2015-03-06 NOTE — Tx Team (Signed)
Initial Interdisciplinary Treatment Plan   PATIENT STRESSORS: Health problems Medication change or noncompliance   PATIENT STRENGTHS: Average or above average intelligence General fund of knowledge Motivation for treatment/growth Supportive family/friends   PROBLEM LIST: Problem List/Patient Goals Date to be addressed Date deferred Reason deferred Estimated date of resolution  " Voices telling me to kill myself and my girl friend" 03/05/15   D/c  " Voices telling me to throw up" 03/05/15     Increased risk for SI      Depression      Anxiety                               DISCHARGE CRITERIA:  Improved stabilization in mood, thinking, and/or behavior Medical problems require only outpatient monitoring Motivation to continue treatment in a less acute level of care Need for constant or close observation no longer present Reduction of life-threatening or endangering symptoms to within safe limits Verbal commitment to aftercare and medication compliance  PRELIMINARY DISCHARGE PLAN: Attend PHP/IOP Outpatient therapy  PATIENT/FAMIILY INVOLVEMENT: This treatment plan has been presented to and reviewed with the patient, [name].  The patient and family have been given the opportunity to ask questions and make suggestions.  Ioane Bhola A 03/06/2015, 5:35 AM

## 2015-03-06 NOTE — ED Notes (Addendum)
Pt sent from [facility] for eval of nausea, vomiting abdominal pain for the past 10 days. Pt states the nausea, vomiting is triggered by AV hallucinations of the pt killing his girlfriend. Pt denies diarrhea.

## 2015-03-06 NOTE — [facility] Counselor (Signed)
Attempted to complete PSA with pt. Pt was vomiting and unable to begin the assessment.

## 2015-03-06 NOTE — ED Notes (Signed)
Bed: WA06 Expected date:  Expected time:  Means of arrival:  Comments: Triage 9 

## 2015-03-07 MED ORDER — BENZTROPINE MESYLATE 0.5 MG PO TABS
0.5000 mg | ORAL_TABLET | Freq: Every day | ORAL | Status: DC
  Administered 2015-03-07 – 2015-03-10 (×4): 0.5 mg via ORAL
  Filled 2015-03-07 (×6): qty 1

## 2015-03-07 MED ORDER — ARIPIPRAZOLE 5 MG PO TABS
5.0000 mg | ORAL_TABLET | Freq: Every day | ORAL | Status: DC
  Administered 2015-03-08 – 2015-03-11 (×4): 5 mg via ORAL
  Filled 2015-03-07 (×5): qty 1
  Filled 2015-03-07: qty 14
  Filled 2015-03-07: qty 1

## 2015-03-07 MED ORDER — BENZTROPINE MESYLATE 0.5 MG PO TABS
0.5000 mg | ORAL_TABLET | Freq: Every day | ORAL | Status: DC
Start: 2015-03-08 — End: 2015-03-11
  Administered 2015-03-08 – 2015-03-11 (×4): 0.5 mg via ORAL
  Filled 2015-03-07 (×5): qty 1
  Filled 2015-03-07: qty 14
  Filled 2015-03-07: qty 1

## 2015-03-07 MED ORDER — ARIPIPRAZOLE 5 MG PO TABS
5.0000 mg | ORAL_TABLET | Freq: Every day | ORAL | Status: DC
  Administered 2015-03-07 – 2015-03-10 (×4): 5 mg via ORAL
  Filled 2015-03-07 (×6): qty 1

## 2015-03-07 MED ORDER — TRAZODONE HCL 50 MG PO TABS
50.0000 mg | ORAL_TABLET | Freq: Every evening | ORAL | Status: DC | PRN
  Administered 2015-03-08: 50 mg via ORAL
  Filled 2015-03-07: qty 1

## 2015-03-07 NOTE — Progress Notes (Signed)
[facility] Post 1:1 Observation Documentation  For the first (8) hours following discontinuation of 1:1 precautions, a progress note entry by nursing staff should be documented at least every 2 hours, reflecting the patient's behavior, condition, mood, and conversation.  Use the progress notes for additional entries.  Time 1:1 discontinued:  9:17am   Patient's Behavior:  [name] continues to stay in his room.  Laying in bed.  He is pleasant and cooperative.    Patient's Condition:  He denies SI/HI or A/V hallucinations at this time.  He states that when he is laying down and sleeping he isn't having any thoughts.  He declined to go to the cafeteria for fear that he will get nauseated again.    Patient's Conversation:  We talked about drinking plenty of fluids and pt was offered ginger ale.  He declined stating he has plenty in his room to drink.    He took his PM medications without difficutly.  VSS.  Encouraged continued participation in group and unit activities.  Q 15 minute checks maintained for safety.  We will continue to monitor the progress towards his goals.  He remains safe on the unit.  [name] 03/07/2015, 6:54 PM

## 2015-03-07 NOTE — Progress Notes (Addendum)
[facility] MD Progress Note  03/07/2015 11:30 AM [name]  MRN:  [id number] Subjective:  Pt states " I am better today , I do not have a lot of nausea, I am not hearing any AH at this point in time."  Objective: Patient is a 40 year old [place] American male, is currently homeless , was living with his girl friend of 60 years in [place] , has a past hx of schizoaffective do, is unemployed , presented to [facility] with reports of SI with a plan to [place] his home.   Patient seen and chart reviewed.Discussed patient with treatment team. Pt had severe nausea/vomiting yesterday , with intermediate chest pain and was send to ED for evaluation. Pt was medically cleared per ED and transferred back to [facility] for further management. Pt reported yesterday that he was having vomiting 2/2 the AH that he was having asking him to kill his girlfriend. Pt today seems better, denies any AH this AM, his nausea is better. Pt reports sleep issues , will add Trazodone for the same. Pt denies any SI at this moment, but he had severe SI with plan yesterday - will continue to monitor. Pt denies any HI at this time. Pt reports he wants to be on Abilify , he had done well on it in the past, his mother also had tried it in the past. Per staff - no disruptive issues noted on the unit.      Principal Problem: Schizoaffective disorder, bipolar type ([place]) Diagnosis:   Patient Active Problem List   Diagnosis Date Noted  . Schizoaffective disorder, bipolar type ([place]) [F25.0] 03/06/2015  . Nausea with vomiting [R11.2] 03/06/2015  . Hypertensive heart disease [I11.9] 01/11/2015  . ST elevation myocardial infarction (STEMI) of inferior wall ([place]) [I21.19] 01/10/2015  . ST elevation myocardial infarction (STEMI) of inferior wall, initial episode of care ([place]) [I21.19] 01/10/2015  . Medical non-compliance [Z91.19]   . STEMI (ST elevation myocardial infarction) ([place]) [I21.3] 01/15/2014  . Hx of medication  noncompliance [Z91.19]   . NSVT (nonsustained ventricular tachycardia) ([place]) [I47.2]   . Ischemic cardiomyopathy [I25.5]   . Tobacco abuse [Z72.0]   . Anxiety [F41.9]   . 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 DES PCI to RCA x 2 (3.0 mm x 22 mm Resolute - distal & proximal RCA) [Z95.5] 01/14/2014    Class: Present on Admission  . Non-STEMI (non-ST elevated myocardial infarction) ([place]) [I21.4] 07/22/2013  . Atypical chest pain [R07.89] 07/21/2013  . Bipolar 1 disorder, depressed ([place]) [F31.9] 07/21/2013  . Chest pain [R07.9] 04/16/2013  . HLD (hyperlipidemia) [E78.5] 04/16/2013  . Hyperlipidemia with target LDL less than 70 [E78.5] 01/27/2009  . SUBSTANCE ABUSE [F19.10] 01/27/2009  . DEPRESSION [F32.9] 01/27/2009  . Essential hypertension [I10] 01/27/2009  . CAD S/P BMS PCI to RCA with PTCA for ISR --> followed by stent thrombosis - DES PCI [I25.10, Z98.61] 01/27/2009  . GASTRITIS [K29.70, K29.90] 01/27/2009   Total Time spent with patient: 30 minutes  Past Psychiatric History: Pt with hx of schizophrenia/bipolar do - was admitted at [facility] - 4 years ago. Pt has had several suicide attempts recently - attempted to walk in traffic x2 in the past 2 months. Pt is noncompliant on medications  Past Medical History:  Past Medical History  Diagnosis Date  . Bipolar 1 disorder ([place])   . Hypertension   . Hyperlipidemia   . Hx of medication noncompliance   . Polysubstance abuse  a. h/o tobacco, marijuana and crack cocaine use. b. 07/2013: +UDS THC, neg for cocaine.  . Anxiety   . CAD S/P percutaneous coronary angioplasty     a. inferior STEMI s/p BMS to RCA 01/2007. b. NSTEMI 07/2013 s/p PTCA to RCA for restenosis (no stenting due to hx noncompliance). c. Transient inferior ST elevation (peak troponin 0.25) 01/2014 s/p PTCA/DES to prox RCA, PTCA/DES to distal RCA, EF 50%; d. 12/2014 Inf STEMI: LM nl,LAD min irregs, D1 60ost, LCX 45p, 93m OM1/2/3 nl, RCA patent prox stent, 1061m  (3.0x32 Synergy DES), EF 35-45%.  . Marland KitchenSVT (nonsustained ventricular tachycardia) ([place] [place]    a. Very brief run during 07/2013 admit for NSTEMI felt due to MI.  . Ischemic cardiomyopathy     a. EF 40% in 2011, 60% in 2012. b. EF 55% by cath 07/2013. c. EF 50% by cath 01/2014; d. 12/2014 EF 35-45% by LV gram.  . Tobacco abuse     Past Surgical History  Procedure Laterality Date  . Femoral artery stent    . Coronary angioplasty with stent placement  02/05/2007    3.5x1849muantum non-DES to RCA (Dr. ThoShelva Majestic. Cardiac catheterization  01/16/2009    normal left main, Cfx with 2-OMs both w/minor irregularities, LAd with 20-30% mid region irregularities, ramus intermediate/optional diagonal with 60% osital narrowing, RCA with stent in distal portion w/20% prox in-stent stenosis (Dr. A. Loni Museittle)  . Cardiac catheterization  07/23/2013    two vessel obstructive CAD, occluded first diagonal, focal in-stent restenosis in distal RCA (Dr. Peter JorMartinique. Left heart catheterization with coronary angiogram N/A 07/23/2013    Procedure: LEFT HEART CATHETERIZATION WITH CORONARY ANGIOGRAM;  Surgeon: Peter M JorMartiniqueD;  [facility];  Service: Cardiovascular;  Laterality: N/A;  . Left and right heart catheterization with coronary angiogram N/A 01/14/2014    Procedure: LEFT AND RIGHT HEART CATHETERIZATION WITH CORONARY ANGIOGRAM;  Surgeon: ChrBurnell BlanksD;  [facility];  Service: Cardiovascular;  Laterality: N/A;  . Cardiac catheterization N/A 01/10/2015    Procedure: Left Heart Cath and Coronary Angiography;  Surgeon: DavLeonie ManD;  Location: [facility];  Service: Cardiovascular;  Laterality: N/A;  . Cardiac catheterization N/A 01/10/2015    Procedure: Coronary Stent Intervention;  Surgeon: DavLeonie ManD;  Location: [facility] LAB;  Service: Cardiovascular;  Laterality: N/A;   Family History:  Family History  Problem Relation Age of Onset  . CAD Mother   .  Heart disease Mother   . Heart attack Mother   . Schizophrenia Mother   . CAD Sister    Family Psychiatric  History:  Pt reports that his mother had a hx of schizophrenia. Pt reports mother and sister both had several suicide attempts.    Social History: Currently homeless, used to live with GF whom he tried to hurt , denies having children, got GED , is unemployed .          History  Alcohol Use  . 0.0 oz/week  . 0 Standard drinks or equivalent per week     History  Drug Use  . Yes  . Special: Marijuana    Comment: Marijuana (last used on 01/07/2015)    Social History   Social History  . Marital Status: Single    Spouse Name: N/A  . Number of Children: N/A  . Years of Education: N/A   Social History Main Topics  . Smoking status: Current  Every Day Smoker -- 0.25 packs/day    Types: Cigarettes  . Smokeless tobacco: None  . Alcohol Use: 0.0 oz/week    0 Standard drinks or equivalent per week  . Drug Use: Yes    Special: Marijuana     Comment: Marijuana (last used on 01/07/2015)  . Sexual Activity: Not Currently   Other Topics Concern  . None   Social History Narrative   Additional Social History:                         Sleep: Poor  Appetite:  Poor  Current Medications: Current Facility-Administered Medications  Medication Dose Route Frequency Provider Last Rate Last Dose  . acetaminophen (TYLENOL) tablet 650 mg  650 mg Oral Q6H PRN Clarene Reamer, MD      . alum & mag hydroxide-simeth (MAALOX/MYLANTA) 200-200-20 MG/5ML suspension 30 mL  30 mL Oral Q4H PRN Clarene Reamer, MD      . ARIPiprazole (ABILIFY) tablet 5 mg  5 mg Oral QHS Ursula Alert, MD      . Derrill Memo ON 03/08/2015] ARIPiprazole (ABILIFY) tablet 5 mg  5 mg Oral Q breakfast Cookie Pore, MD      . aspirin EC tablet 81 mg  81 mg Oral Daily Clarene Reamer, MD   81 mg at 03/07/15 0743  . benztropine (COGENTIN) tablet 0.5 mg  0.5 mg Oral QHS Ursula Alert, MD      . Derrill Memo ON  03/08/2015] benztropine (COGENTIN) tablet 0.5 mg  0.5 mg Oral Q breakfast Laruth Hanger, MD      . clonazePAM (KLONOPIN) tablet 0.5 mg  0.5 mg Oral TID BM Kynesha Guerin, MD   0.5 mg at 03/07/15 1012  . feeding supplement (ENSURE ENLIVE) (ENSURE ENLIVE) liquid 237 mL  237 mL Oral TID BM Davien Malone, MD   237 mL at 03/07/15 1012  . lisinopril (PRINIVIL,ZESTRIL) tablet 5 mg  5 mg Oral Daily Clarene Reamer, MD   5 mg at 03/07/15 0743  . magnesium hydroxide (MILK OF MAGNESIA) suspension 30 mL  30 mL Oral Daily PRN Clarene Reamer, MD      . nicotine (NICODERM CQ - dosed in mg/24 hours) patch 21 mg  21 mg Transdermal Daily Ursula Alert, MD   21 mg at 03/07/15 0745  . nitroGLYCERIN (NITROSTAT) SL tablet 0.4 mg  0.4 mg Sublingual Q5 min PRN Clarene Reamer, MD      . OLANZapine zydis (ZYPREXA) disintegrating tablet 5 mg  5 mg Oral TID PRN Ursula Alert, MD      . ondansetron (ZOFRAN) tablet 4 mg  4 mg Oral Q8H PRN Clarene Reamer, MD   4 mg at 03/06/15 2240  . pravastatin (PRAVACHOL) tablet 40 mg  40 mg Oral Daily Clarene Reamer, MD   40 mg at 03/07/15 0743  . promethazine (PHENERGAN) injection 25 mg  25 mg Intramuscular Q8H PRN Ursula Alert, MD   25 mg at 03/06/15 1018  . ticagrelor (BRILINTA) tablet 90 mg  90 mg Oral BID Clarene Reamer, MD   90 mg at 03/07/15 0743  . traZODone (DESYREL) tablet 50 mg  50 mg Oral QHS PRN Ursula Alert, MD        Lab Results:  Results for orders placed or performed during the hospital encounter of 03/05/15 (from the past 48 hour(s))  Lipase, blood     Status: None   Collection Time: 03/06/15  2:59 PM  Result Value Ref Range   Lipase 22 11 - 51 U/L  Comprehensive metabolic panel     Status: Abnormal   Collection Time: 03/06/15  2:59 PM  Result Value Ref Range   Sodium 134 (L) 135 - 145 mmol/L   Potassium 3.6 3.5 - 5.1 mmol/L   Chloride 93 (L) 101 - 111 mmol/L   CO2 26 22 - 32 mmol/L   Glucose, Bld 107 (H) 65 - 99 mg/dL   BUN 8 6 - 20 mg/dL    Creatinine, Ser 1.14 0.61 - 1.24 mg/dL   Calcium 9.5 8.9 - 10.3 mg/dL   Total Protein 7.1 6.5 - 8.1 g/dL   Albumin 4.5 3.5 - 5.0 g/dL   AST 32 15 - 41 U/L   ALT 30 17 - 63 U/L   Alkaline Phosphatase 50 38 - 126 U/L   Total Bilirubin 1.4 (H) 0.3 - 1.2 mg/dL   GFR calc non Af Amer >60 >60 mL/min   GFR calc Af Amer >60 >60 mL/min    Comment: (NOTE) The eGFR has been calculated using the CKD EPI equation. This calculation has not been validated in all clinical situations. eGFR's persistently <60 mL/min signify possible Chronic Kidney Disease.    Anion gap 15 5 - 15  CBC     Status: None   Collection Time: 03/06/15  2:59 PM  Result Value Ref Range   WBC 5.9 4.0 - 10.5 K/uL   RBC 5.06 4.22 - 5.81 MIL/uL   Hemoglobin 16.0 13.0 - 17.0 g/dL   HCT 46.8 39.0 - 52.0 %   MCV 92.5 78.0 - 100.0 fL   MCH 31.6 26.0 - 34.0 pg   MCHC 34.2 30.0 - 36.0 g/dL   RDW 14.9 11.5 - 15.5 %   Platelets 269 150 - 400 K/uL  Urinalysis, Routine w reflex microscopic (not at [place])     Status: Abnormal   Collection Time: 03/06/15  4:28 PM  Result Value Ref Range   Color, Urine AMBER (A) YELLOW    Comment: BIOCHEMICALS MAY BE AFFECTED BY COLOR   APPearance CLEAR CLEAR   Specific Gravity, Urine 1.042 (H) 1.005 - 1.030   pH 6.5 5.0 - 8.0   Glucose, UA NEGATIVE NEGATIVE mg/dL   Hgb urine dipstick NEGATIVE NEGATIVE   Bilirubin Urine MODERATE (A) NEGATIVE   Ketones, ur 40 (A) NEGATIVE mg/dL   Protein, ur 100 (A) NEGATIVE mg/dL   Nitrite NEGATIVE NEGATIVE   Leukocytes, UA NEGATIVE NEGATIVE  Urine microscopic-add on     Status: Abnormal   Collection Time: 03/06/15  4:28 PM  Result Value Ref Range   Squamous Epithelial / LPF 0-5 (A) NONE SEEN   WBC, UA 0-5 0 - 5 WBC/hpf   RBC / HPF 0-5 0 - 5 RBC/hpf   Bacteria, UA RARE (A) NONE SEEN   Urine-Other MUCOUS PRESENT   I-stat troponin, ED     Status: None   Collection Time: 03/06/15 10:15 PM  Result Value Ref Range   Troponin i, poc 0.02 0.00 - 0.08 ng/mL    Comment 3            Comment: Due to the release kinetics of cTnI, a negative result within the first hours of the onset of symptoms does not rule out myocardial infarction with certainty. If myocardial infarction is still suspected, repeat the test at appropriate intervals.     Blood Alcohol level:  Lab Results  Component Value Date   [facility] <5 03/04/2015  ETH <11 02/05/2013    Physical Findings: AIMS:  , ,  ,  ,    CIWA:    COWS:     Musculoskeletal: Strength & Muscle Tone: within normal limits Gait & Station: normal Patient leans: N/A  Psychiatric Specialty Exam: Review of Systems  Psychiatric/Behavioral: Positive for hallucinations. The patient is nervous/anxious and has insomnia.   All other systems reviewed and are negative.   Blood pressure 123/77, pulse 104, temperature 98.5 F (36.9 C), temperature source Oral, resp. rate 14, height 5' 9.5" (1.765 m), weight 51.03 kg (112 lb 8 oz), SpO2 100 %.Body mass index is 16.38 kg/(m^2).  General Appearance: Fairly Groomed  Engineer, water::  Fair  Speech:  Clear and Coherent  Volume:  Decreased  Mood:  Depressed  Affect:  Appropriate  Thought Process:  Goal Directed  Orientation:  Full (Time, Place, and Person)  Thought Content:  Delusions and Hallucinations: Auditory Command:  asking him to kill self and his GF - improving  Suicidal Thoughts:  No  Homicidal Thoughts:  No  Memory:  Immediate;   Fair Recent;   Fair Remote;   Fair  Judgement:  Impaired  Insight:  Shallow  Psychomotor Activity:  Decreased  Concentration:  Poor  Recall:  [employer] of Knowledge:Fair  Language: Fair  Akathisia:  No  Handed:  Right  AIMS (if indicated):     Assets:  Desire for Improvement  ADL's:  Intact  Cognition: WNL  Sleep:  Number of Hours: 3.75   Treatment Plan Summary:Patient is a 40 year old African American male, is currently homeless , was living with his girl friend of 76 years in [place] , has a past hx of schizoaffective  do, is unemployed , presented to [facility] with reports of SI with a plan to lay [place] rail [place] home. Pt today with some improvement of his sx. Will continue treatment. Daily contact with patient to assess and evaluate symptoms and progress in treatment and Medication management  Will DC 1;1 precaution for safety since pt is improved. Will continue to encourage PO fluids . Will discontinue Zyprexa , patient prefers to be on Abilify. Will start Abilify 5 mg po daily qhs , increase to 5 mg po bid for psychosis tomorrow. Will start Cogentin 0.5 mg po bid for EPS. Will continue Klonopin 0.5 mg po tid for anxiety sx. Will add Trazodone 50 mg po qhs prn for sleep. Will make available PRN medications as per agitation protocol. Will continue to monitor vitals ,medication compliance and treatment side effects while patient is here.  Will monitor for medical issues as well as call consult as needed.  Will get Lipid panel, hba1c, pl,tsh. CSW will start working on disposition.  Patient to participate in therapeutic milieu .      Clair Bardwell, MD 03/07/2015, 11:30 AM

## 2015-03-07 NOTE — Progress Notes (Signed)
Patiient has remained in his bed resting this shift., he c/o being tired and every time he tries to get rest someome wakes him up. He requested all his medications at 2030 which he received. He denies si/hi/a/v hallucinations. Support givne and safety maintained on unit with 15 min  Checks.

## 2015-03-07 NOTE — [facility] Group Notes (Signed)
[facility] LCSW Group Therapy   03/07/2015 1:50 PM  Type of Therapy: Group Therapy  Participation Level:  Active  Participation Quality:  Attentive  Affect:  Flat  Cognitive:  Oriented  Insight:  Limited  Engagement in Therapy:  Engaged  Modes of Intervention:  Discussion and Socialization  Summary of Progress/Problems: Chaplain was here to lead a group on themes of hope and/or courage.  Pt was present for the beginning of group. Left and did not return.  [name] 03/07/2015 1:50 PM

## 2015-03-07 NOTE — Progress Notes (Signed)
RN 1:1 Note  D: Pt returns from [facility] after being medically cleared for any cardiac concerns. Pt accompanied by his sitter. Pt reports a decrease in the AH. No signs of distress noted. Pt is currently resting in prone position. Pt denies any HI/AVH. No urges to vomit at this time.  A: Continued support and availability as needed was extended to this pt. Staff continues to monitor pt with q53min checks.  R: No adverse drug reactions noted. Pt receptive to treatment. Pt remains safe at this time.

## 2015-03-07 NOTE — Progress Notes (Signed)
[facility] Post 1:1 Observation Documentation  For the first (8) hours following discontinuation of 1:1 precautions, a progress note entry by nursing staff should be documented at least every 2 hours, reflecting the patient's behavior, condition, mood, and conversation.  Use the progress notes for additional entries.  Time 1:1 discontinued:  917am  Patient's Behavior:  Pt came back from lunch early because the smell of the food was making him sick.  He is laying quietly in the bed drinking his drinks.    Patient's Condition:  Pt stated that he had the "dry heaves" when he returned from lunch.  He denies any SI/HI at this time but continues to state that he is just "really tired."  He denies any pain or discomfort at this time.  HE completed his self inventory and reports that his depression and hopelessness are 4/10 and his anxiety is 3/10.  His goal for today is "stopping the voices or calm them down more and to stop thinking bad thoughts about my boo."  He will accomplish his goals by "take meds, stay calm and sleep."    Patient's Conversation:  We talked about his nausea and he stated that he was able to drink his ensure without difficulty today.  Reminded him that he will need to make sure he drinks plenty of fluids today.  He continues to state that he isn't hearing voices at this time.  Gait steady and he denies any dizziness.   Encouraged continued participation in group and unit activities.  Q 15 minute checks maintained for safety.  We will continue to monitor the progress towards his goals.  Aarish remains safe on the unit.  Norm Parcel Wafa Martes 03/07/2015, 12:51 PM

## 2015-03-07 NOTE — Progress Notes (Signed)
[facility] Post 1:1 Observation Documentation  For the first (8) hours following discontinuation of 1:1 precautions, a progress note entry by nursing staff should be documented at least every 2 hours, reflecting the patient's behavior, condition, mood, and conversation.  Use the progress notes for additional entries.  Time 1:1 discontinued:  9:17am  Patient's Behavior:  Pt is sitting quietly in his room talking with the Child psychotherapist.    Patient's Condition:  He stated earlier that his voices are gone for right now.  He denies any pain or discomfort and appears to be in no acute distress.  He states that he is very tired today and is wanting to rest.  He walked up to the nurses station earlier and he was very steady on his feet.    Patient's Conversation:  He states that his voices are better right now and attributes this to the zyprexa that he received earlier.  We discussed that he likes his room very warm since he has his heart attacks.  "I cannot stand when I am cold."  Encouraged continued participation in group and unit activities.  Q 15 minute checks maintained for safety.  We will continue to monitor the progress towards his goals.  [name] remains safe on the unit.   [name] 03/07/2015, 11:51 AM

## 2015-03-07 NOTE — Progress Notes (Signed)
D: Pt in bed resting with eyes closed. Respirations even and unlabored. Pt appears to be in no signs of distress at this time. A: 1:1 observation remains for pt's safety. R: Pt remains safe at this time.

## 2015-03-07 NOTE — Tx Team (Signed)
Interdisciplinary Treatment Plan Update (Adult)  Date:  03/07/2015   Time Reviewed:  1:23 PM   Progress in Treatment: Attending groups: Yes. Participating in groups:  Yes. Taking medication as prescribed:  Yes. Tolerating medication:  Yes. Family/Significant other contact made:  No Patient understands diagnosis:  Yes  As evidenced by seeking help with command hallucinations Discussing patient identified problems/goals with staff:  Yes, see initial care plan. Medical problems stabilized or resolved:  Yes. Denies suicidal/homicidal ideation: Yes. Issues/concerns per patient self-inventory:  No. Other:  New problem(s) identified:  Discharge Plan or Barriers: see bleow  Reason for Continuation of Hospitalization: Hallucinations Medication stabilization  Comments:  : Pt is a 40 yr old vol admitted for AH to kill himself and his GF. Pt reports that the [facility] are making him vomit as well. Pt became nauseated during the assessment. Pt did not vomit. Pt denies any illicit drug use. He reports rare alcohol use. Pt's uds was (+) for THC. ETOH <5. Pt been off his medications for 2 weeks. Pt reports that he started hearing voices after having a heart attack about 4-5 years ago. Per pt, he's been feeling "haunted" since his heart attack this past December. Pt oriented to the unit polices and proceduresPt today with some improvement of his sx. Will continue treatment. Daily contact with patient to assess and evaluate symptoms and progress in treatment and Medication management  Will DC 1;1 precaution for safety since pt is improved. Will continue to encourage PO fluids . Will discontinue Zyprexa , patient prefers to be on Abilify. Will start Abilify 5 mg po daily qhs , increase to 5 mg po bid for psychosis tomorrow. Will start Cogentin 0.5 mg po bid for EPS. Will continue Klonopin 0.5 mg po tid for anxiety sx. Will add Trazodone 50 mg po qhs prn for sleep.   Estimated length of stay: 4-5  days  New goal(s):  Review of initial/current patient goals per problem list:   Review of initial/current patient goals per problem list:  1. Goal(s): Patient will participate in aftercare plan   Met: Yes   Target date: 3-5 days post admission date   As evidenced by: Patient will participate within aftercare plan AEB aftercare provider and housing plan at discharge being identified. 03/07/15;  Return home, follow up [facility]  03/07/2015:   2. Goal (s): Patient will exhibit decreased depressive symptoms and suicidal ideations.   Met: Yes   Target date: 3-5 days post admission date   As evidenced by: Patient will utilize self rating of depression at 3 or below and demonstrate decreased signs of depression or be deemed stable for discharge by MD. 03/07/15:  Denies SI, rates depression a 3 today         5. Goal(s): Patient will demonstrate decreased signs of psychosis  * Met: Progressing  * Target date: 3-5 days post admission date  * As evidenced by: Patient will demonstrate decreased frequency of AVH or return to baseline function 03/07/15:  No meds prior to admission.  Command hallucinations prior to admission that was causing the pt distress.  Agreed to Abilify here, and is saying command is gone.           Attendees: Patient:  03/07/2015 1:23 PM   Family:   03/07/2015 1:23 PM   Physician:  Ursula Alert, MD 03/07/2015 1:23 PM   Nursing:   Gaylan Gerold, RN 03/07/2015 1:23 PM   CSW:    Roque Lias, LCSW   03/07/2015 1:23 PM  Other:  03/07/2015 1:23 PM   Other:   03/07/2015 1:23 PM   Other:  Lars Pinks, Nurse CM 03/07/2015 1:23 PM   Other:   03/07/2015 1:23 PM   Other:  Norberto Sorenson, [place]  03/07/2015 1:23 PM   Other:  03/07/2015 1:23 PM   Other:  03/07/2015 1:23 PM   Other:  03/07/2015 1:23 PM   Other:  03/07/2015 1:23 PM   Other:  03/07/2015 1:23 PM   Other:   03/07/2015 1:23 PM    Scribe for Treatment Team:   Trish Mage, 03/07/2015 1:23 PM

## 2015-03-07 NOTE — [facility] Suicide Risk Assessment (Signed)
[facility] INPATIENT:  Family/Significant Other Suicide Prevention Education  Suicide Prevention Education:  Education Completed; [name], SO, [id number] has been identified by the patient as the family member/significant other with whom the patient will be residing, and identified as the person(s) who will aid the patient in the event of a mental health crisis (suicidal ideations/suicide attempt).  With written consent from the patient, the family member/significant other has been provided the following suicide prevention education, prior to the and/or following the discharge of the patient.  The suicide prevention education provided includes the following:  Suicide risk factors  Suicide prevention and interventions  National Suicide Hotline telephone number  [facility] assessment telephone number  [facility] Emergency Assistance 911  [facility] and/or Residential Mobile Crisis Unit telephone number  Request made of family/significant other to:  Remove weapons (e.g., guns, rifles, knives), all items previously/currently identified as safety concern.    Remove drugs/medications (over-the-counter, prescriptions, illicit drugs), all items previously/currently identified as a safety concern.  The family member/significant other verbalizes understanding of the suicide prevention education information provided.  The family member/significant other agrees to remove the items of safety concern listed above.  [name] 03/07/2015, 2:56 PM

## 2015-03-07 NOTE — Progress Notes (Signed)
[facility] Post 1:1 Observation Documentation  For the first (8) hours following discontinuation of 1:1 precautions, a progress note entry by nursing staff should be documented at least every 2 hours, reflecting the patient's behavior, condition, mood, and conversation.  Use the progress notes for additional entries.  Time 1:1 discontinued:  917am  Patient's Behavior:  [name] is sleeping in his room.    Patient's Condition:  He was pleasant and cooperative while taking his AM medications.  He denies SI/HI.  He continues to report that he is hearing voices but they are better today.  He stated that a pill that he took yesterday helped him a lot.  He took his medications without difficultly.    Patient's Conversation:  He reported that he feels better physically.  No pain or discomfort this morning.  He discussed wanting to start taking Abilify.  Urged him to discuss with Dr. Elna Breslow.  We will continue to monitor the progress towards his goals.  [name] remains safe on the unit.  Norm Parcel Dawnette Mione 03/07/2015, 9:25 AM

## 2015-03-07 NOTE — [facility] Group Notes (Signed)
[facility] LCSW Aftercare Discharge Planning Group Note   03/07/2015 1:25 PM  Participation Quality:  Invited. Chose not to attend.  Jonathon Jordan

## 2015-03-07 NOTE — Progress Notes (Signed)
RN 1:1 Note D: Pt is in the dayroom getting his am vital signs performed. No signs of distress noted. A: 1:1 observation remains for pt's safety. R: Pt remains safe at this time.

## 2015-03-07 NOTE — Progress Notes (Signed)
[facility] Post 1:1 Observation Documentation  For the first (8) hours following discontinuation of 1:1 precautions, a progress note entry by nursing staff should be documented at least every 2 hours, reflecting the patient's behavior, condition, mood, and conversation.  Use the progress notes for additional entries.  Time 1:1 discontinued:  9:17am  Patient's Behavior:  Pt is requesting quietly in his bed.  Patient's Condition:  He states that he has had suicidal thoughts one time and hasn't had thoughts of harming his girlfriend at this time.  He states that he is unable to eat because the smell of food makes his nauseated.  He denies any a/v hallucinations at this time.  Patient's Conversation:  We talked about him going down to the cafeteria for supper but urged him to eat something light since he is able to keep the ensures down.  He stated that when he is sleeping he doesn't think about things.  Urged him to get up and go to some of the groups.  "I did earlier and the what they were talking about made me feel worse."    Encouraged continued participation in group and unit activities.  Q 15 minute checks maintained for safety.  We will continue to monitor the progress towards his goals.  He remains safe on the unit.  Norm Parcel Tiffanyann Deroo 03/07/2015, 4:17 PM
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
## 2015-03-08 DIAGNOSIS — R45851 Suicidal ideations: Secondary | ICD-10-CM

## 2015-03-08 DIAGNOSIS — F250 Schizoaffective disorder, bipolar type: Principal | ICD-10-CM

## 2015-03-08 DIAGNOSIS — R45850 Homicidal ideations: Secondary | ICD-10-CM

## 2015-03-08 LAB — TSH: TSH: 0.73 u[IU]/mL (ref 0.350–4.500)

## 2015-03-08 LAB — LIPID PANEL
CHOL/HDL RATIO: 4.5 ratio
Cholesterol: 203 mg/dL — ABNORMAL HIGH (ref 0–200)
HDL: 45 mg/dL (ref >40)
LDL Cholesterol: 139 mg/dL — ABNORMAL HIGH (ref 0–99)
Triglycerides: 94 mg/dL (ref <150)
VLDL: 19 mg/dL (ref 0–40)
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
## 2015-03-08 NOTE — [facility] Group Notes (Signed)
[facility] LCSW Group Therapy  03/08/2015 1:15 PM   Type of Therapy:  Group Therapy  Participation Level:  Did Not Attend  Reyes Ivan, LCSW 03/08/2015 1:14 PM

## 2015-03-08 NOTE — Progress Notes (Signed)
DAR NOTE: Patient remained in his room and in bed most of the shift.  Appetite remained poor.  Fluid intake encouraged. Patient continues to report dizziness and fatigue.  Reports hearing voices and having suicidal thoughts but contracts for safety.  Rates depression at 5, hopelessness at 4, and anxiety at 4.  Maintained on routine safety checks.  Medications given as prescribed.  Support and encouragement offered as needed.  States goal for today is "getting used to my meds."  No nausea and vomiting noted.

## 2015-03-08 NOTE — Progress Notes (Signed)
Patient has been in his bed lying down resting. His heat in the room is at 85-90 degrees and patient reports that he stays cold.  He did not attend group but came to medication window briefly to request trazadone for sleep. He was encouraged to drink plenty of fluids because ensures from day shift have not been drank. He reports auditory hallucinations, si and hi, denies visual hallucinations. Support gfivne, safety maintained on unit with 15 min checks.

## 2015-03-08 NOTE — Progress Notes (Signed)
Did not attended group 

## 2015-03-09 MED ORDER — TRAZODONE HCL 100 MG PO TABS
100.0000 mg | ORAL_TABLET | Freq: Every evening | ORAL | Status: DC | PRN
  Administered 2015-03-09 – 2015-03-10 (×2): 100 mg via ORAL
  Filled 2015-03-09: qty 1
  Filled 2015-03-09: qty 7
  Filled 2015-03-09: qty 1

## 2015-03-09 NOTE — Plan of Care (Signed)
Problem: Ineffective individual coping Goal: STG: Patient will remain free from self harm Outcome: Progressing Patient has remained free from self harm, safety maintained with 15 min checks.     

## 2015-03-09 NOTE — [facility] Group Notes (Signed)
[facility] Group Notes:  (Nursing/MHT/Case Management/Adjunct)  Date:  03/09/2015  Time:  12:32 PM  Type of Therapy:  Psychoeducational Skills  Participation Level:  Did Not Attend  Participation Quality:  Did Not Attend  Affect:  Did Not Attend  Cognitive:  Did Not Attend  Insight:  None  Engagement in Group:  Did Not Attend  Modes of Intervention:  Did Not Attend  Summary of Progress/Problems: Pt did not attend patient self inventory group. Jacquelyne Balint Shanta 03/09/2015, 12:32 PM

## 2015-03-09 NOTE — Progress Notes (Signed)
Patient ID: SIDDIQ KALUZNY, male   DOB: 07/23/1975, 40 y.o.   MRN: [id number] Patient ID: HEZIKIAH RETZLOFF, male   DOB: January 04, 1976, 40 y.o.   MRN: [id number] [facility] MD Progress Note  03/09/2015 12:10 PM TANIELA FELTUS  MRN:  [id number] Subjective:  Pt states he is doing much better today and his "attitude has changed". Command AH telling him to kill himself have decreased to 1/10 and are not strong. He is letting the SI thoughts go. HI are 2/10. Reports his sleep is so/so but appetite is improving. Depression is worse because his cousin died and pt wanted to attend his funeral today. Reports dizziness is better.    Objective: Patient is a 40 year old African American male, is currently homeless , was living with his girl friend of 17 years in [place] , has a past hx of schizoaffective do, is unemployed , presented to [facility] with reports of SI with a [place].   Patient seen and chart reviewed. Pt had severe nausea/vomiting 03/06/2015 , with intermediate chest pain and was send to ED for evaluation. Pt was medically cleared per ED and transferred back to [facility] for further management. Pt reports he is physically better today.  He has left his bed and was seen sitting in the hallway. Pt is not attending groups. Reports he wants to be on Abilify , he had done well on it in the past, his mother also had tried it in the past. Per staff - no disruptive issues noted on the unit.      Principal Problem: Schizoaffective disorder, bipolar type (HCC) Diagnosis:   Patient Active Problem List   Diagnosis Date Noted  . Schizoaffective disorder, bipolar type (HCC) [F25.0] 03/06/2015  . Nausea with vomiting [R11.2] 03/06/2015  . Hypertensive heart disease [I11.9] 01/11/2015  . ST elevation myocardial infarction (STEMI) of inferior wall (HCC) [I21.19] 01/10/2015  . ST elevation myocardial infarction (STEMI) of inferior wall, initial episode of care (HCC) [I21.19] 01/10/2015  .  Medical non-compliance [Z91.19]   . STEMI (ST elevation myocardial infarction) (HCC) [I21.3] 01/15/2014  . Hx of medication noncompliance [Z91.19]   . NSVT (nonsustained ventricular tachycardia) (HCC) [I47.2]   . Ischemic cardiomyopathy [I25.5]   . Tobacco abuse [Z72.0]   . Anxiety [F41.9]   . 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 DES PCI to RCA x 2 (3.0 mm x 22 mm Resolute - distal & proximal RCA) [Z95.5] 01/14/2014    Class: Present on Admission  . Non-STEMI (non-ST elevated myocardial infarction) (HCC) [I21.4] 07/22/2013  . Atypical chest pain [R07.89] 07/21/2013  . Bipolar 1 disorder, depressed (HCC) [F31.9] 07/21/2013  . Chest pain [R07.9] 04/16/2013  . HLD (hyperlipidemia) [E78.5] 04/16/2013  . Hyperlipidemia with target LDL less than 70 [E78.5] 01/27/2009  . SUBSTANCE ABUSE [F19.10] 01/27/2009  . DEPRESSION [F32.9] 01/27/2009  . Essential hypertension [I10] 01/27/2009  . CAD S/P BMS PCI to RCA with PTCA for ISR --> followed by stent thrombosis - DES PCI [I25.10, Z98.61] 01/27/2009  . GASTRITIS [K29.70, K29.90] 01/27/2009   Total Time spent with patient: 20 minutes  Past Psychiatric History: Pt with hx of schizophrenia/bipolar do - was admitted at [facility] - 4 years ago. Pt has had several suicide attempts recently - attempted to walk in traffic x2 in the past 2 months. Pt is noncompliant on medications  Past Medical History:  Past Medical History  Diagnosis Date  . Bipolar 1 disorder (  HCC)   . Hypertension   . Hyperlipidemia   . Hx of medication noncompliance   . Polysubstance abuse     a. h/o tobacco, marijuana and crack cocaine use. b. 07/2013: +UDS THC, neg for cocaine.  . Anxiety   . CAD S/P percutaneous coronary angioplasty     a. inferior STEMI s/p BMS to RCA 01/2007. b. NSTEMI 07/2013 s/p PTCA to RCA for restenosis (no stenting due to hx noncompliance). c. Transient inferior ST elevation (peak troponin 0.25) 01/2014 s/p PTCA/DES to prox RCA, PTCA/DES to distal  RCA, EF 50%; d. 12/2014 Inf STEMI: LM nl,LAD min irregs, D1 60ost, LCX 45p, 51m, OM1/2/3 nl, RCA patent prox stent, 153m/d (3.0x32 Synergy DES), EF 35-45%.  Marland Kitchen NSVT (nonsustained ventricular tachycardia) (HCC)     a. Very brief run during 07/2013 admit for NSTEMI felt due to MI.  . Ischemic cardiomyopathy     a. EF 40% in 2011, 60% in 2012. b. EF 55% by cath 07/2013. c. EF 50% by cath 01/2014; d. 12/2014 EF 35-45% by LV gram.  . Tobacco abuse     Past Surgical History  Procedure Laterality Date  . Femoral artery stent    . Coronary angioplasty with stent placement  02/05/2007    3.5x47mm Quantum non-DES to RCA (Dr. Nicki Guadalajara)  . Cardiac catheterization  01/16/2009    normal left main, Cfx with 2-OMs both w/minor irregularities, LAd with 20-30% mid region irregularities, ramus intermediate/optional diagonal with 60% osital narrowing, RCA with stent in distal portion w/20% prox in-stent stenosis (Dr. Mervyn Skeeters. [place])  . Cardiac catheterization  07/23/2013    two vessel obstructive CAD, occluded first diagonal, focal in-stent restenosis in distal RCA (Dr. Peter Swaziland)  . Left heart catheterization with coronary angiogram N/A 07/23/2013    Procedure: LEFT HEART CATHETERIZATION WITH CORONARY ANGIOGRAM;  Surgeon: Peter M Swaziland, MD;  Location: [facility] CATH LAB;  Service: Cardiovascular;  Laterality: N/A;  . Left and right heart catheterization with coronary angiogram N/A 01/14/2014    Procedure: LEFT AND RIGHT HEART CATHETERIZATION WITH CORONARY ANGIOGRAM;  Surgeon: Kathleene Hazel, MD;  Location: [facility] CATH LAB;  Service: Cardiovascular;  Laterality: N/A;  . Cardiac catheterization N/A 01/10/2015    Procedure: Left Heart Cath and Coronary Angiography;  Surgeon: Marykay Lex, MD;  Location: [facility] INVASIVE CV LAB;  Service: Cardiovascular;  Laterality: N/A;  . Cardiac catheterization N/A 01/10/2015    Procedure: Coronary Stent Intervention;  Surgeon: Marykay Lex, MD;  Location: [facility] INVASIVE CV LAB;  Service:  Cardiovascular;  Laterality: N/A;   Family History:  Family History  Problem Relation Age of Onset  . CAD Mother   . Heart disease Mother   . Heart attack Mother   . Schizophrenia Mother   . CAD Sister    Family Psychiatric  History:  Pt reports that his mother had a hx of schizophrenia. Pt reports mother and sister both had several suicide attempts.    Social History: Currently homeless, used to live with GF whom he tried to hurt , denies having children, got GED , is unemployed .          History  Alcohol Use  . 0.0 oz/week  . 0 Standard drinks or equivalent per week     History  Drug Use  . Yes  . Special: Marijuana    Comment: Marijuana (last used on 01/07/2015)    Social History   Social History  . Marital Status: Single    Spouse  Name: N/A  . Number of Children: N/A  . Years of Education: N/A   Social History Main Topics  . Smoking status: Current Every Day Smoker -- 0.25 packs/day    Types: Cigarettes  . Smokeless tobacco: None  . Alcohol Use: 0.0 oz/week    0 Standard drinks or equivalent per week  . Drug Use: Yes    Special: Marijuana     Comment: Marijuana (last used on 01/07/2015)  . Sexual Activity: Not Currently   Other Topics Concern  . None   Social History Narrative   Additional Social History:                         Sleep: Poor  Appetite:  Fair  Current Medications: Current Facility-Administered Medications  Medication Dose Route Frequency Provider Last Rate Last Dose  . acetaminophen (TYLENOL) tablet 650 mg  650 mg Oral Q6H PRN Benjaman Pott, MD      . alum & mag hydroxide-simeth (MAALOX/MYLANTA) 200-200-20 MG/5ML suspension 30 mL  30 mL Oral Q4H PRN Benjaman Pott, MD      . ARIPiprazole (ABILIFY) tablet 5 mg  5 mg Oral QHS Jomarie Longs, MD   5 mg at 03/08/15 2111  . ARIPiprazole (ABILIFY) tablet 5 mg  5 mg Oral Q breakfast Jomarie Longs, MD   5 mg at 03/09/15 0816  . aspirin EC tablet 81 mg  81 mg Oral Daily  Benjaman Pott, MD   81 mg at 03/09/15 0815  . benztropine (COGENTIN) tablet 0.5 mg  0.5 mg Oral QHS Jomarie Longs, MD   0.5 mg at 03/08/15 2111  . benztropine (COGENTIN) tablet 0.5 mg  0.5 mg Oral Q breakfast Jomarie Longs, MD   0.5 mg at 03/09/15 0816  . clonazePAM (KLONOPIN) tablet 0.5 mg  0.5 mg Oral TID BM Jomarie Longs, MD   0.5 mg at 03/09/15 0815  . feeding supplement (ENSURE ENLIVE) (ENSURE ENLIVE) liquid 237 mL  237 mL Oral TID BM Jomarie Longs, MD   237 mL at 03/08/15 2001  . lisinopril (PRINIVIL,ZESTRIL) tablet 5 mg  5 mg Oral Daily Benjaman Pott, MD   5 mg at 03/09/15 0815  . magnesium hydroxide (MILK OF MAGNESIA) suspension 30 mL  30 mL Oral Daily PRN Benjaman Pott, MD      . nicotine (NICODERM CQ - dosed in mg/24 hours) patch 21 mg  21 mg Transdermal Daily Saramma Eappen, MD   21 mg at 03/09/15 0815  . nitroGLYCERIN (NITROSTAT) SL tablet 0.4 mg  0.4 mg Sublingual Q5 min PRN Benjaman Pott, MD      . OLANZapine zydis (ZYPREXA) disintegrating tablet 5 mg  5 mg Oral TID PRN Jomarie Longs, MD   5 mg at 03/09/15 0817  . ondansetron (ZOFRAN) tablet 4 mg  4 mg Oral Q8H PRN Benjaman Pott, MD   4 mg at 03/06/15 2240  . pravastatin (PRAVACHOL) tablet 40 mg  40 mg Oral Daily Benjaman Pott, MD   40 mg at 03/09/15 0816  . promethazine (PHENERGAN) injection 25 mg  25 mg Intramuscular Q8H PRN Jomarie Longs, MD   25 mg at 03/06/15 1018  . ticagrelor (BRILINTA) tablet 90 mg  90 mg Oral BID Benjaman Pott, MD   90 mg at 03/09/15 0815  . traZODone (DESYREL) tablet 50 mg  50 mg Oral QHS PRN Jomarie Longs, MD   50 mg at 03/08/15 2114  Lab Results:  Results for orders placed or performed during the hospital encounter of 03/05/15 (from the past 48 hour(s))  TSH     Status: None   Collection Time: 03/08/15  6:30 AM  Result Value Ref Range   TSH 0.730 0.350 - 4.500 uIU/mL    Comment: Performed at [facility]  Lipid panel     Status: Abnormal   Collection Time:  03/08/15  6:30 AM  Result Value Ref Range   Cholesterol 203 (H) 0 - 200 mg/dL   Triglycerides 94 <161 mg/dL   HDL 45 >09 mg/dL   Total CHOL/HDL Ratio 4.5 RATIO   VLDL 19 0 - 40 mg/dL   LDL Cholesterol 604 (H) 0 - 99 mg/dL    Comment:        Total Cholesterol/HDL:CHD Risk Coronary Heart Disease Risk Table                     Men   Women  1/2 Average Risk   3.4   3.3  Average Risk       5.0   4.4  2 X Average Risk   9.6   7.1  3 X Average Risk  23.4   11.0        Use the calculated Patient Ratio above and the CHD Risk Table to determine the patient's CHD Risk.        ATP III CLASSIFICATION (LDL):  <100     mg/dL   Optimal  540-981  mg/dL   Near or Above                    Optimal  130-159  mg/dL   Borderline  191-478  mg/dL   High  >295     mg/dL   Very High Performed at [facility]     Blood Alcohol level:  Lab Results  Component Value Date   [facility] <5 03/04/2015   ETH <11 02/05/2013    Physical Findings: AIMS:  , ,  ,  ,    CIWA:    COWS:     Musculoskeletal: Strength & Muscle Tone: within normal limits Gait & Station: normal Patient leans: N/A  Psychiatric Specialty Exam: Review of Systems  Constitutional: Positive for malaise/fatigue.  Neurological: Negative for dizziness.  Psychiatric/Behavioral: Positive for depression, suicidal ideas and hallucinations. The patient is nervous/anxious and has insomnia.   All other systems reviewed and are negative.   Blood pressure 114/84, pulse 91, temperature 98.3 F (36.8 C), temperature source Oral, resp. rate 20, height 5' 9.5" (1.765 m), weight 51.256 kg (113 lb), SpO2 100 %.Body mass index is 16.45 kg/(m^2).  General Appearance: Fairly Groomed  Patent attorney::  Fair  Speech:  Clear and Coherent  Volume:  Normal  Mood:  Depressed  Affect:  Appropriate- improved. More interactive  Thought Process:  Goal Directed  Orientation:  Full (Time, Place, and Person)  Thought Content:  Delusions and Hallucinations:  Auditory Visual- improving  Suicidal Thoughts:  Yes.  With plan but denies intent  Homicidal Thoughts:  Yes.  without intent/plan  Memory:  Immediate;   Fair Recent;   Fair Remote;   Fair  Judgement:  Impaired  Insight:  Shallow  Psychomotor Activity:  Normal  Concentration:  Fair  Recall:  [employer] of Knowledge:Fair  Language: Fair  Akathisia:  No  Handed:  Right  AIMS (if indicated):     Assets:  Desire for Improvement  ADL's:  Intact  Cognition: WNL  Sleep:  Number of Hours: 5.25   Treatment Plan Summary:Patient is a 40 year old African American male, is currently homeless , was living with his girl friend of 17 years in [place] , has a past hx of schizoaffective do, is unemployed , presented to [facility] with reports of SI with a plan [place]. Pt today with some improvement of his sx. Will continue treatment. Daily contact with patient to assess and evaluate symptoms and progress in treatment and Medication management   Will continue to encourage PO fluids .  Abilify 5 mg po bid for psychosis   Cogentin 0.5 mg po bid for EPS. Will continue Klonopin 0.5 mg po tid for anxiety sx. Increase Trazodone to 100 mg po qhs prn for sleep. Will make available PRN medications as per agitation protocol. Will continue to monitor vitals ,medication compliance and treatment side effects while patient is here.  Will monitor for medical issues as well as call consult as needed.  Reviewed Lipid panel- Chol 203, LDL 139, Tsh- WNL. CSW will start working on disposition.  Patient to participate in therapeutic milieu .      Oletta Darter, MD 03/09/2015, 12:10 PM

## 2015-03-09 NOTE — Progress Notes (Signed)
Patient ID: [name], male   DOB: 08/03/1975, 40 y.o.   MRN: [id number]   D: Pt has been very flat and depressed and isolative on the unit today. Pt remained in his room most of the time and did not want to go to lunch. After some encouragement patient did eat and did have both of his ensure. Pt reported that he did not sleep well last night, the doctor was made aware new orders were noted. Pt reported that his depression was a 4, his hopelessness was a 3, and that his anxiety was a 3. Pt reported that his goal for today was to take meds for sleep. Pt reported being negative SI/HI, no AH/VH noted. A: 15 min checks continued for patient safety. R: Pt safety maintained.

## 2015-03-09 NOTE — Plan of Care (Signed)
Problem: Diagnosis: Increased Risk For Suicide Attempt Goal: STG-Patient Will Comply With Medication Regime Outcome: Progressing Patient is compliant with scheduled medications.     

## 2015-03-09 NOTE — Plan of Care (Signed)
Problem: Diagnosis: Increased Risk For Suicide Attempt Goal: STG-Patient Will Attend All Groups On The Unit Outcome: Not Progressing Patient did not attend evening group.     

## 2015-03-09 NOTE — Progress Notes (Signed)
Writer observed patient up in the dayroom watching tv this evening. We spoke 1:1 at medication window and he reports that he has had a good, has been eating more and feels much better. He denies si/hi/a/v hallucinations. He is informed of hs medications and is informed of the dosage increase with his trazadone. Support and praise given for him being up and out of his room tonight.  Safety maintained on unit with 15 min checks.

## 2015-03-09 NOTE — [facility] Group Notes (Signed)
[facility] LCSW Group Therapy  03/09/2015   11:00 AM   Type of Therapy:  Group Therapy  Participation Level:  Minimal  Participation Quality:  Appropriate and Attentive  Affect:  Flat  Cognitive:  Alert and Appropriate   Insight:  Developing/Improving and Engaged  Engagement in Therapy:  Developing/Improving and Engaged  Modes of Intervention:  Clarification, Confrontation, Discussion, Education, Exploration, Limit-setting, Orientation, Problem-solving, Rapport Building, Dance movement psychotherapist, Socialization and Support  Summary of Progress/Problems: The main focus of today's process group was to identify the patient's current support system and decide on other supports that can be put in place.  An emphasis was placed on using counselor, doctor, therapy groups, 12-step groups, and problem-specific support groups to expand supports, as well as doing something different than has been done before. Pt shared that he plans to break up with his girlfriend soon as he feels she is not a good support for him.  Pt states that his sister is a good support and plans to spend more time with her.  Pt actively participated and was engaged in group discussion.     Justin Ivan, LCSW 03/09/2015 2:23 PM

## 2015-03-10 ENCOUNTER — Telehealth: Payer: Self-pay | Admitting: *Deleted

## 2015-03-10 LAB — HEMOGLOBIN A1C
Hgb A1c MFr Bld: 5.4 % (ref 4.8–5.6)
MEAN PLASMA GLUCOSE: 108 mg/dL

## 2015-03-10 LAB — PROLACTIN: PROLACTIN: 9.7 ng/mL (ref 4.0–15.2)

## 2015-03-10 MED ORDER — ARIPIPRAZOLE ER 400 MG IM SUSR
400.0000 mg | INTRAMUSCULAR | Status: DC
  Administered 2015-03-10: 400 mg via INTRAMUSCULAR

## 2015-03-10 NOTE — Progress Notes (Signed)
D:  Patient's self inventory sheet, patient has poor sleep, sleep medication was not helpful.  Fair appetite, low energy level, good concentration.  Rated depression and hopeless 3, anxiety 4.  Withdrawals for cigarettes, has experienced tremors, chilling, cravings, cramping, nausea.  SI when he first got up this morning.  Denied SI later in the day.  Physical problems, lightheaded, dizziness.  Physical pain, L/R leg while trying to sleep, worst pain in past 24 hours is #2.  Goal is to keep voices at a maintainable level, eat more, be honest with staff.  Stay away from other people's stress, hope he can make it through this.  Does have discharge plans.  Perfect discharge plan from MD.  A:  Medications administered per MD orders.  Emotional support and encouragement given patient. R:  Denied SI and HI, contracts for safety.  Denied A/V hallucinations.  Safety maintained with 15 minute checks. Patient denied SI and HI this afternoon.  Also denied A/V hallucinations this afternoon.  Stated he is feeling much better now.  Would like to discuss discharge. Patient has been talking to peers this afternoon in dayroom. Patient has been cooperative and pleasant.

## 2015-03-10 NOTE — [facility] Group Notes (Signed)
[facility] LCSW Aftercare Discharge Planning Group Note  03/10/2015 8:45 AM  Participation Quality: Alert, Appropriate and Oriented  Mood/Affect: Appropriate  Depression Rating: 0  Anxiety Rating: "It has decreased from a 4 to a 1 this morning"  Thoughts of Suicide: Pt denies SI/HI  Will you contract for safety? Yes  Current AVH: Pt denies  Plan for Discharge/Comments: Pt attended discharge planning group and actively participated in group. CSW discussed suicide prevention education with the group and encouraged them to discuss discharge planning and any relevant barriers. Pt expressed that he feels that his mood and mental status has improved. Per Pt, his anxiety has decreased which makes him feel hopeful.  Transportation Means: Pt reports access to transportation  Supports: No supports mentioned at this time  Chad Cordial, LCSWA 03/10/2015 1:30 PM

## 2015-03-10 NOTE — Plan of Care (Signed)
Problem: Consults Goal: Depression Patient Education See Patient Education Module for education specifics.  Outcome: Progressing Nurse discussed depression/coping skills with patient.        

## 2015-03-10 NOTE — Telephone Encounter (Signed)
Faxed Medicaid phase 2 cardiac rehab form to [facility] cardiac rehab.

## 2015-03-10 NOTE — Progress Notes (Signed)
Patient's weight is 114 lbs this morning.

## 2015-03-10 NOTE — Progress Notes (Signed)
Adult Psychoeducational Group Note  Date:  03/10/2015 Time:  8:58 PM  Group Topic/Focus:  Wrap-Up Group:   The focus of this group is to help patients review their daily goal of treatment and discuss progress on daily workbooks.  Participation Level:  Active  Participation Quality:  Appropriate  Affect:  Appropriate  Cognitive:  Appropriate  Insight: Appropriate  Engagement in Group:  Engaged  Modes of Intervention:  Discussion  Additional Comments: The patient expressed that he did not attend group.  Octavio Manns 03/10/2015, 8:58 PM

## 2015-03-10 NOTE — [facility] Group Notes (Signed)
[facility] LCSW Group Therapy  03/10/2015 4:16 PM  Type of Therapy:  Group Therapy  Participation Level:  Active  Participation Quality:  Appropriate and Attentive  Affect:  Appropriate  Cognitive:  Alert and Appropriate  Insight:  Developing/Improving  Engagement in Therapy:  Developing/Improving  Modes of Intervention:  Discussion, Exploration, Problem-solving and Support  Summary of Progress/Problems:  Finding Balance in Life. Today's group focused on defining balance in one's own words, identifying things that can knock one off balance, and exploring healthy ways to maintain balance in life. Group members were asked to provide an example of a time when they felt off balance, describe how they handled that situation, and process healthier ways to regain balance in the future. Group members were asked to share the most important tool for maintaining balance that they learned while at [facility] and how they plan to apply this method after discharge.   Patient discussed balance as "being in control" and maintaining balance in interpersonal relationships as being important to keeping himself in balance.  Described difficulty in current relationship where he perceives that girlfriend is inconsiderate of his needs.  Voiced disappointment w family who distanced themselves from him after his mother died - "no one even talks to me any more."  States that he can take actions including "drinking a beer, walking away and finding someone to talk to" as ways to maintain balance.  States that he needs to develop patience in order to have effective relationships w others.  Sallee Lange 03/10/2015, 4:16 PM

## 2015-03-10 NOTE — Progress Notes (Signed)
Patient ID: [name], male   DOB: 05-Nov-1975, 40 y.o.   MRN: [id number] Patient ID: [name], male   DOB: [birth year], 40 y.o.   MRN: [id number] [facility] MD Progress Note  03/10/2015 2:19 PM [name]  MRN:  [id number] Subjective:  Pt states  ' I am fine, I still have some passive SI , but otherwise I feel better.'   Objective: Patient is a 40 year old African American male, is currently homeless , was living with his girl friend of 17 years in [place] , has a past hx of schizoaffective do, is unemployed , presented to [facility] with reports of SI with a [place].   Patient seen and chart reviewed. Pt denies any new concerns today . Reports depression as improving. Pt reports passive SI , but is working on his thoughts and feels like he is making progress. Pt per staff - has been compliant on medications , has not had any nausea or was not observed as responding to internal stimuli. Per staff - no disruptive issues noted on the unit.      Principal Problem: Schizoaffective disorder, bipolar type (HCC) Diagnosis:   Patient Active Problem List   Diagnosis Date Noted  . Schizoaffective disorder, bipolar type (HCC) [F25.0] 03/06/2015  . Nausea with vomiting [R11.2] 03/06/2015  . Hypertensive heart disease [I11.9] 01/11/2015  . ST elevation myocardial infarction (STEMI) of inferior wall (HCC) [I21.19] 01/10/2015  . ST elevation myocardial infarction (STEMI) of inferior wall, initial episode of care (HCC) [I21.19] 01/10/2015  . Medical non-compliance [Z91.19]   . STEMI (ST elevation myocardial infarction) (HCC) [I21.3] 01/15/2014  . Hx of medication noncompliance [Z91.19]   . NSVT (nonsustained ventricular tachycardia) (HCC) [I47.2]   . Ischemic cardiomyopathy [I25.5]   . Tobacco abuse [Z72.0]   . Anxiety [F41.9]   . 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 DES PCI to RCA x 2 (3.0 mm x 22 mm Resolute - distal & proximal RCA)  [Z95.5] 01/14/2014    Class: Present on Admission  . Non-STEMI (non-ST elevated myocardial infarction) (HCC) [I21.4] 07/22/2013  . Atypical chest pain [R07.89] 07/21/2013  . Bipolar 1 disorder, depressed (HCC) [F31.9] 07/21/2013  . Chest pain [R07.9] 04/16/2013  . HLD (hyperlipidemia) [E78.5] 04/16/2013  . Hyperlipidemia with target LDL less than 70 [E78.5] 01/27/2009  . SUBSTANCE ABUSE [F19.10] 01/27/2009  . DEPRESSION [F32.9] 01/27/2009  . Essential hypertension [I10] 01/27/2009  . CAD S/P BMS PCI to RCA with PTCA for ISR --> followed by stent thrombosis - DES PCI [I25.10, Z98.61] 01/27/2009  . GASTRITIS [K29.70, K29.90] 01/27/2009   Total Time spent with patient: 20 minutes  Past Psychiatric History: Pt with hx of schizophrenia/bipolar do - was admitted at [facility] - 4 years ago. Pt has had several suicide attempts recently - attempted to walk in traffic x2 in the past 2 months. Pt is noncompliant on medications  Past Medical History:  Past Medical History  Diagnosis Date  . Bipolar 1 disorder (HCC)   . Hypertension   . Hyperlipidemia   . Hx of medication noncompliance   . Polysubstance abuse     a. h/o tobacco, marijuana and crack cocaine use. b. 07/2013: +UDS THC, neg for cocaine.  . Anxiety   . CAD S/P percutaneous coronary angioplasty     a. inferior STEMI s/p BMS to RCA 01/2007. b. NSTEMI 07/2013 s/p PTCA to RCA for restenosis (no stenting due to hx  noncompliance). c. Transient inferior ST elevation (peak troponin 0.25) 01/2014 s/p PTCA/DES to prox RCA, PTCA/DES to distal RCA, EF 50%; d. 12/2014 Inf STEMI: LM nl,LAD min irregs, D1 60ost, LCX 45p, 83m, OM1/2/3 nl, RCA patent prox stent, 166m/d (3.0x32 Synergy DES), EF 35-45%.  Marland Kitchen NSVT (nonsustained ventricular tachycardia) (HCC)     a. Very brief run during 07/2013 admit for NSTEMI felt due to MI.  . Ischemic cardiomyopathy     a. EF 40% in 2011, 60% in 2012. b. EF 55% by cath 07/2013. c. EF 50% by cath 01/2014; d. 12/2014 EF 35-45% by LV  gram.  . Tobacco abuse     Past Surgical History  Procedure Laterality Date  . Femoral artery stent    . Coronary angioplasty with stent placement  02/05/2007    3.5x27mm Quantum non-DES to RCA (Dr. Nicki Guadalajara)  . Cardiac catheterization  01/16/2009    normal left main, Cfx with 2-OMs both w/minor irregularities, LAd with 20-30% mid region irregularities, ramus intermediate/optional diagonal with 60% osital narrowing, RCA with stent in distal portion w/20% prox in-stent stenosis (Dr. Mervyn Skeeters. [place])  . Cardiac catheterization  07/23/2013    two vessel obstructive CAD, occluded first diagonal, focal in-stent restenosis in distal RCA (Dr. Peter Swaziland)  . Left heart catheterization with coronary angiogram N/A 07/23/2013    Procedure: LEFT HEART CATHETERIZATION WITH CORONARY ANGIOGRAM;  Surgeon: Peter M Swaziland, MD;  Location: [facility] CATH LAB;  Service: Cardiovascular;  Laterality: N/A;  . Left and right heart catheterization with coronary angiogram N/A 01/14/2014    Procedure: LEFT AND RIGHT HEART CATHETERIZATION WITH CORONARY ANGIOGRAM;  Surgeon: Kathleene Hazel, MD;  Location: [facility] CATH LAB;  Service: Cardiovascular;  Laterality: N/A;  . Cardiac catheterization N/A 01/10/2015    Procedure: Left Heart Cath and Coronary Angiography;  Surgeon: Marykay Lex, MD;  Location: [facility] INVASIVE CV LAB;  Service: Cardiovascular;  Laterality: N/A;  . Cardiac catheterization N/A 01/10/2015    Procedure: Coronary Stent Intervention;  Surgeon: Marykay Lex, MD;  Location: [facility] INVASIVE CV LAB;  Service: Cardiovascular;  Laterality: N/A;   Family History:  Family History  Problem Relation Age of Onset  . CAD Mother   . Heart disease Mother   . Heart attack Mother   . Schizophrenia Mother   . CAD Sister    Family Psychiatric  History:  Pt reports that his mother had a hx of schizophrenia. Pt reports mother and sister both had several suicide attempts.    Social History: Currently homeless, used to live with  GF whom he tried to hurt , denies having children, got GED , is unemployed .          History  Alcohol Use  . 0.0 oz/week  . 0 Standard drinks or equivalent per week     History  Drug Use  . Yes  . Special: Marijuana    Comment: Marijuana (last used on 01/07/2015)    Social History   Social History  . Marital Status: Single    Spouse Name: N/A  . Number of Children: N/A  . Years of Education: N/A   Social History Main Topics  . Smoking status: Current Every Day Smoker -- 0.25 packs/day    Types: Cigarettes  . Smokeless tobacco: None  . Alcohol Use: 0.0 oz/week    0 Standard drinks or equivalent per week  . Drug Use: Yes    Special: Marijuana     Comment: Marijuana (last used on 01/07/2015)  .  Sexual Activity: Not Currently   Other Topics Concern  . None   Social History Narrative   Additional Social History:                         Sleep: Fair  Appetite:  Fair  Current Medications: Current Facility-Administered Medications  Medication Dose Route Frequency Provider Last Rate Last Dose  . acetaminophen (TYLENOL) tablet 650 mg  650 mg Oral Q6H PRN Benjaman Pott, MD      . alum & mag hydroxide-simeth (MAALOX/MYLANTA) 200-200-20 MG/5ML suspension 30 mL  30 mL Oral Q4H PRN Benjaman Pott, MD      . ARIPiprazole (ABILIFY) tablet 5 mg  5 mg Oral QHS Jomarie Longs, MD   5 mg at 03/09/15 2114  . ARIPiprazole (ABILIFY) tablet 5 mg  5 mg Oral Q breakfast Jomarie Longs, MD   5 mg at 03/10/15 0837  . aspirin EC tablet 81 mg  81 mg Oral Daily Benjaman Pott, MD   81 mg at 03/10/15 1610  . benztropine (COGENTIN) tablet 0.5 mg  0.5 mg Oral QHS Jomarie Longs, MD   0.5 mg at 03/09/15 2114  . benztropine (COGENTIN) tablet 0.5 mg  0.5 mg Oral Q breakfast Jomarie Longs, MD   0.5 mg at 03/10/15 0837  . clonazePAM (KLONOPIN) tablet 0.5 mg  0.5 mg Oral TID BM Humbert Morozov, MD   0.5 mg at 03/10/15 1003  . feeding supplement (ENSURE ENLIVE) (ENSURE ENLIVE) liquid  237 mL  237 mL Oral TID BM Leather Estis, MD   237 mL at 03/10/15 1012  . lisinopril (PRINIVIL,ZESTRIL) tablet 5 mg  5 mg Oral Daily Benjaman Pott, MD   5 mg at 03/10/15 0836  . magnesium hydroxide (MILK OF MAGNESIA) suspension 30 mL  30 mL Oral Daily PRN Benjaman Pott, MD      . nicotine (NICODERM CQ - dosed in mg/24 hours) patch 21 mg  21 mg Transdermal Daily Jomarie Longs, MD   21 mg at 03/10/15 0834  . nitroGLYCERIN (NITROSTAT) SL tablet 0.4 mg  0.4 mg Sublingual Q5 min PRN Benjaman Pott, MD      . OLANZapine zydis [facility]) disintegrating tablet 5 mg  5 mg Oral TID PRN Jomarie Longs, MD   5 mg at 03/09/15 0817  . ondansetron (ZOFRAN) tablet 4 mg  4 mg Oral Q8H PRN Benjaman Pott, MD   4 mg at 03/06/15 2240  . pravastatin (PRAVACHOL) tablet 40 mg  40 mg Oral Daily Benjaman Pott, MD   40 mg at 03/10/15 9604  . promethazine (PHENERGAN) injection 25 mg  25 mg Intramuscular Q8H PRN Jomarie Longs, MD   25 mg at 03/06/15 1018  . ticagrelor (BRILINTA) tablet 90 mg  90 mg Oral BID Benjaman Pott, MD   90 mg at 03/10/15 5409  . traZODone (DESYREL) tablet 100 mg  100 mg Oral QHS PRN Oletta Darter, MD   100 mg at 03/09/15 2114    Lab Results:  No results found for this or any previous visit (from the past 48 hour(s)).  Blood Alcohol level:  Lab Results  Component Value Date   [facility] <5 03/04/2015   ETH <11 02/05/2013    Physical Findings: AIMS:  , ,  ,  ,    CIWA:    COWS:     Musculoskeletal: Strength & Muscle Tone: within normal limits Gait & Station: normal Patient leans: N/A  Psychiatric Specialty Exam: Review of Systems  Neurological: Negative for dizziness.  Psychiatric/Behavioral: Positive for depression and suicidal ideas. The patient is nervous/anxious.   All other systems reviewed and are negative.   Blood pressure 104/68, pulse 101, temperature 98.2 F (36.8 C), temperature source Oral, resp. rate 17, height 5' 9.5" (1.765 m), weight 51.71 kg (114 lb), SpO2 100  %.Body mass index is 16.6 kg/(m^2).  General Appearance: Fairly Groomed  Patent attorney::  Fair  Speech:  Clear and Coherent  Volume:  Normal  Mood:  Depressed  Affect:  Appropriate- improved. More interactive  Thought Process:  Goal Directed  Orientation:  Full (Time, Place, and Person)  Thought Content:  Delusions and Hallucinations: Auditory Visual- improving  Suicidal Thoughts:  Yes. Without intent  Homicidal Thoughts:  No  Memory:  Immediate;   Fair Recent;   Fair Remote;   Fair  Judgement:  Fair  Insight:  Shallow  Psychomotor Activity:  Normal  Concentration:  Fair  Recall:  [employer] of Knowledge:Fair  Language: Fair  Akathisia:  No  Handed:  Right  AIMS (if indicated):     Assets:  Desire for Improvement  ADL's:  Intact  Cognition: WNL  Sleep:  Number of Hours: 6.25   Treatment Plan Summary:Patient is a 40 year old African American male, is currently homeless , was living with his girl friend of 17 years in [place] , has a past hx of schizoaffective do, is unemployed , presented to [facility] with reports of SI with a [place]. Pt today with some improvement of his sx. Will continue treatment. Daily contact with patient to assess and evaluate symptoms and progress in treatment and Medication management    Abilify 5 mg po bid for psychosis , will provide Abilify Maintena IM 400 mg - first dose today - repeat q28 days.  Cogentin 0.5 mg po bid for EPS. Will continue Klonopin 0.5 mg po tid for anxiety sx. Increased Trazodone to 100 mg po qhs prn for sleep. Will make available PRN medications as per agitation protocol. Will continue to monitor vitals ,medication compliance and treatment side effects while patient is here.  Will monitor for medical issues as well as call consult as needed.  Reviewed Lipid panel- Chol 203, LDL 139, Tsh- WNL. CSW will start working on disposition.  Patient to participate in therapeutic milieu .       Drelyn Pistilli, MD 03/10/2015, 2:19 PM

## 2015-03-10 NOTE — Progress Notes (Signed)
Patient admitted that his girlfriend brought him cigarettes after smoke was smelled on unit.  MHT retrieved cigarettes and matches which were put in patient's med drawer.

## 2015-03-11 MED ORDER — TICAGRELOR 90 MG PO TABS: 90.0000 mg | ORAL_TABLET | Freq: Two times a day (BID) | ORAL | Status: DC

## 2015-03-11 MED ORDER — ARIPIPRAZOLE 5 MG PO TABS: ORAL_TABLET | ORAL | Status: DC

## 2015-03-11 MED ORDER — TRAZODONE HCL 100 MG PO TABS: 100.0000 mg | ORAL_TABLET | Freq: Every evening | ORAL | Status: DC | PRN

## 2015-03-11 MED ORDER — PRAVASTATIN SODIUM 40 MG PO TABS: 40.0000 mg | ORAL_TABLET | Freq: Every day | ORAL | Status: DC

## 2015-03-11 MED ORDER — ASPIRIN 81 MG PO TBEC: 81.0000 mg | DELAYED_RELEASE_TABLET | Freq: Every day | ORAL | Status: DC

## 2015-03-11 MED ORDER — NICOTINE 21 MG/24HR TD PT24: 21.0000 mg | MEDICATED_PATCH | Freq: Every day | TRANSDERMAL | Status: DC

## 2015-03-11 MED ORDER — BENZTROPINE MESYLATE 0.5 MG PO TABS: ORAL_TABLET | ORAL | Status: DC

## 2015-03-11 MED ORDER — ARIPIPRAZOLE ER 400 MG IM SUSR: 400.0000 mg | INTRAMUSCULAR | Status: DC

## 2015-03-11 MED ORDER — LISINOPRIL 5 MG PO TABS: 5.0000 mg | ORAL_TABLET | Freq: Every day | ORAL | Status: DC

## 2015-03-11 NOTE — Progress Notes (Signed)
  [facility] Adult Case Management Discharge Plan :  Will you be returning to the same living situation after discharge:  Yes,  Pt returning home At discharge, do you have transportation home?: Yes,  Pt provided with a bus pass Do you have the ability to pay for your medications: Yes,  Pt provided with supply and prescriptions  Release of information consent forms completed and in the chart;  Patient's signature needed at discharge.  Patient to Follow up at: Follow-up Information    Follow up with [facility].   Specialty:  Behavioral Health   Why:  Go to the walk-in clinic M-F between 8 and 10AM for your hospital follow up appointment   Contact information:   [address] [place] [address] [phone]       Next level of care provider has access to [facility] Link:no  Safety Planning and Suicide Prevention discussed: Yes,  with significant other; see SPE note for further details  Have you used any form of tobacco in the last 30 days? (Cigarettes, Smokeless Tobacco, Cigars, and/or Pipes): Yes  Has patient been referred to the Quitline?: Yes, faxed on 03/11/15  Patient has been referred for addiction treatment: N/A  Elaina Hoops 03/11/2015, 9:41 AM

## 2015-03-11 NOTE — Progress Notes (Signed)
Patient discharged home with prescriptions and samples. Patient was stable and appreciative at that time. All papers and prescriptions were given and valuables returned. Verbal understanding expressed. Denies SI/HI and A/VH. Patient given opportunity to express concerns and ask questions.  

## 2015-03-11 NOTE — Discharge Summary (Signed)
Physician Discharge Summary Note  Patient:  [name] is an 40 y.o., male MRN:  [id number] DOB:  Oct 27, 1975 Patient phone:  [phone] (home)  Patient address:   [address] [place] [address],  Total Time spent with patient: 30 minutes  Date of Admission:  03/05/2015 Date of Discharge: 03/11/2015  Reason for Admission:  Suicidal ideation  Principal Problem: Schizoaffective disorder, bipolar type [facility]) Discharge Diagnoses: Patient Active Problem List   Diagnosis Date Noted  . Schizoaffective disorder, bipolar type (HCC) [F25.0] 03/06/2015  . Nausea with vomiting [R11.2] 03/06/2015  . Hypertensive heart disease [I11.9] 01/11/2015  . ST elevation myocardial infarction (STEMI) of inferior wall (HCC) [I21.19] 01/10/2015  . ST elevation myocardial infarction (STEMI) of inferior wall, initial episode of care (HCC) [I21.19] 01/10/2015  . Medical non-compliance [Z91.19]   . STEMI (ST elevation myocardial infarction) (HCC) [I21.3] 01/15/2014  . Hx of medication noncompliance [Z91.19]   . NSVT (nonsustained ventricular tachycardia) (HCC) [I47.2]   . Ischemic cardiomyopathy [I25.5]   . Tobacco abuse [Z72.0]   . Anxiety [F41.9]   . 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 DES PCI to RCA x 2 (3.0 mm x 22 mm Resolute - distal & proximal RCA) [Z95.5] 01/14/2014    Class: Present on Admission  . Non-STEMI (non-ST elevated myocardial infarction) (HCC) [I21.4] 07/22/2013  . Atypical chest pain [R07.89] 07/21/2013  . Bipolar 1 disorder, depressed (HCC) [F31.9] 07/21/2013  . Chest pain [R07.9] 04/16/2013  . HLD (hyperlipidemia) [E78.5] 04/16/2013  . Hyperlipidemia with target LDL less than 70 [E78.5] 01/27/2009  . SUBSTANCE ABUSE [F19.10] 01/27/2009  . DEPRESSION [F32.9] 01/27/2009  . Essential hypertension [I10] 01/27/2009  . CAD S/P BMS PCI to RCA with PTCA for ISR --> followed by stent thrombosis - DES PCI [I25.10, Z98.61] 01/27/2009  . GASTRITIS [K29.70,  K29.90] 01/27/2009    Past Psychiatric History:  See above noted  Past Medical History:  Past Medical History  Diagnosis Date  . Bipolar 1 disorder (HCC)   . Hypertension   . Hyperlipidemia   . Hx of medication noncompliance   . Polysubstance abuse     a. h/o tobacco, marijuana and crack cocaine use. b. 07/2013: +UDS THC, neg for cocaine.  . Anxiety   . CAD S/P percutaneous coronary angioplasty     a. inferior STEMI s/p BMS to RCA 01/2007. b. NSTEMI 07/2013 s/p PTCA to RCA for restenosis (no stenting due to hx noncompliance). c. Transient inferior ST elevation (peak troponin 0.25) 01/2014 s/p PTCA/DES to prox RCA, PTCA/DES to distal RCA, EF 50%; d. 12/2014 Inf STEMI: LM nl,LAD min irregs, D1 60ost, LCX 45p, 15m, OM1/2/3 nl, RCA patent prox stent, 157m/d (3.0x32 Synergy DES), EF 35-45%.  Marland Kitchen NSVT (nonsustained ventricular tachycardia) (HCC)     a. Very brief run during 07/2013 admit for NSTEMI felt due to MI.  . Ischemic cardiomyopathy     a. EF 40% in 2011, 60% in 2012. b. EF 55% by cath 07/2013. c. EF 50% by cath 01/2014; d. 12/2014 EF 35-45% by LV gram.  . Tobacco abuse     Past Surgical History  Procedure Laterality Date  . Femoral artery stent    . Coronary angioplasty with stent placement  02/05/2007    3.5x58mm Quantum non-DES to RCA (Dr. Nicki Guadalajara)  . Cardiac catheterization  01/16/2009    normal left main, Cfx with 2-OMs both w/minor irregularities, LAd with 20-30% mid region irregularities, ramus intermediate/optional diagonal with 60% osital narrowing, RCA with stent  in distal portion w/20% prox in-stent stenosis (Dr. Mervyn Skeeters. [place])  . Cardiac catheterization  07/23/2013    two vessel obstructive CAD, occluded first diagonal, focal in-stent restenosis in distal RCA (Dr. Peter Swaziland)  . Left heart catheterization with coronary angiogram N/A 07/23/2013    Procedure: LEFT HEART CATHETERIZATION WITH CORONARY ANGIOGRAM;  Surgeon: Peter M Swaziland, MD;  Location: [facility] CATH LAB;  Service:  Cardiovascular;  Laterality: N/A;  . Left and right heart catheterization with coronary angiogram N/A 01/14/2014    Procedure: LEFT AND RIGHT HEART CATHETERIZATION WITH CORONARY ANGIOGRAM;  Surgeon: Kathleene Hazel, MD;  Location[facility] CATH LAB;  Service: Cardiovascular;  Laterality: N/A;  . Cardiac catheterization N/A 01/10/2015    Procedure: Left Heart Cath and Coronary Angiography;  Surgeon: Marykay Lex, MD;  Location: [facility] INVASIVE CV LAB;  Service: Cardiovascular;  Laterality: N/A;  . Cardiac catheterization N/A 01/10/2015    Procedure: Coronary Stent Intervention;  Surgeon: Marykay Lex, MD;  Location: [facility] INVASIVE CV LAB;  Service: Cardiovascular;  Laterality: N/A;   Family History:  Family History  Problem Relation Age of Onset  . CAD Mother   . Heart disease Mother   . Heart attack Mother   . Schizophrenia Mother   . CAD Sister    Family Psychiatric  History:  See above noted Social History:  History  Alcohol Use  . 0.0 oz/week  . 0 Standard drinks or equivalent per week     History  Drug Use  . Yes  . Special: Marijuana    Comment: Marijuana (last used on 01/07/2015)    Social History   Social History  . Marital Status: Single    Spouse Name: N/A  . Number of Children: N/A  . Years of Education: N/A   Social History Main Topics  . Smoking status: Current Every Day Smoker -- 0.25 packs/day    Types: Cigarettes  . Smokeless tobacco: None  . Alcohol Use: 0.0 oz/week    0 Standard drinks or equivalent per week  . Drug Use: Yes    Special: Marijuana     Comment: Marijuana (last used on 01/07/2015)  . Sexual Activity: Not Currently   Other Topics Concern  . None   Social History Narrative    Hospital Course:  [name], a 40 year old male, has a past hx of schizoaffective do, is unemployed, presented to [facility] with reports of SI with a plan [place] on [place] his home.  [name] was admitted for Schizoaffective disorder,  bipolar type (HCC) and crisis management.  He was treated with the following medications, Abilify 5 mg po bid for psychosis , will provide Abilify Maintena IM 400 mg (given 03/11/15, next dose due 04/08/2015), Cogentin 0.5 mg po bid for EPS, Klonopin 0.5 mg po tid for anxiety sx, Trazodone to 100 mg po qhs prn for sleep adavailable PRN medications as per agitation protocol.   SAIM ALMANZA was discharged with current medication and was instructed on how to take medications as prescribed; (details listed below under Medication List).  Medical problems were identified and treated as needed.  Home medications were restarted as appropriate.  Improvement was monitored by observation and Junie Bame daily report of symptom reduction.  Emotional and mental status was monitored by daily self-inventory reports completed by Junie Bame and clinical staff.         Junie Bame was evaluated by the treatment team for  stability and plans for continued recovery upon discharge.  NEALY KARAPETIAN motivation was an integral factor for scheduling further treatment.  Employment, transportation, bed availability, health status, family support, and any pending legal issues were also considered during his hospital stay.  He was offered further treatment options upon discharge including but not limited to Residential, Intensive Outpatient, and Outpatient treatment.  GIANNI MIHALIK will follow up with the services as listed below under Follow Up Information.     Upon completion of this admission the LESTON SCHUELLER was both mentally and medically stable for discharge denying suicidal/homicidal ideation, auditory/visual/tactile hallucinations, delusional thoughts and paranoia.     Physical Findings: AIMS: Facial and Oral Movements Muscles of Facial Expression: None, normal Lips and Perioral Area: None, normal Jaw: None, normal Tongue: None, normal,Extremity Movements Upper (arms, wrists, hands, fingers):  None, normal Lower (legs, knees, ankles, toes): None, normal, Trunk Movements Neck, shoulders, hips: None, normal, Overall Severity Severity of abnormal movements (highest score from questions above): None, normal Incapacitation due to abnormal movements: None, normal Patient's awareness of abnormal movements (rate only patient's report): No Awareness, Dental Status Current problems with teeth and/or dentures?: No Does patient usually wear dentures?: No  CIWA:  CIWA-Ar Total: 1 COWS:  COWS Total Score: 2  Musculoskeletal: Strength & Muscle Tone: within normal limits Gait & Station: normal Patient leans: N/A  Psychiatric Specialty Exam:  SEE MD SRA Review of Systems  All other systems reviewed and are negative.   Blood pressure 103/74, pulse 123, temperature 98.3 F (36.8 C), temperature source Oral, resp. rate 20, height 5' 9.5" (1.765 m), weight 51.483 kg (113 lb 8 oz), SpO2 100 %.Body mass index is 16.53 kg/(m^2).  Have you used any form of tobacco in the last 30 days? (Cigarettes, Smokeless Tobacco, Cigars, and/or Pipes): Yes  Has this patient used any form of tobacco in the last 30 days? (Cigarettes, Smokeless Tobacco, Cigars, and/or Pipes) Yes, Rx given to patient.  Blood Alcohol level:  Lab Results  Component Value Date   [facility] <5 03/04/2015   ETH <11 02/05/2013    Metabolic Disorder Labs:  Lab Results  Component Value Date   HGBA1C 5.4 03/08/2015   MPG 108 03/08/2015   MPG 120 01/10/2015   Lab Results  Component Value Date   PROLACTIN 9.7 03/08/2015   Lab Results  Component Value Date   CHOL 203* 03/08/2015   TRIG 94 03/08/2015   HDL 45 03/08/2015   CHOLHDL 4.5 03/08/2015   VLDL 19 03/08/2015   LDLCALC 139* 03/08/2015   LDLCALC 169* 01/10/2015    See Psychiatric Specialty Exam and Suicide Risk Assessment completed by Attending Physician prior to discharge.  Discharge destination:  Home  Is patient on multiple antipsychotic therapies at discharge:  Yes,    Do you recommend tapering to monotherapy for antipsychotics?  No   Has Patient had three or more failed trials of antipsychotic monotherapy by history:  No  Recommended Plan for Multiple Antipsychotic Therapies: NA     Medication List    STOP taking these medications        nitroGLYCERIN 0.4 MG SL tablet  Commonly known as:  NITROSTAT      TAKE these medications      Indication   ARIPiprazole 5 MG tablet  Commonly known as:  ABILIFY  Take one tab (5 mg) in the morning.  Then tale 1 tab (5 mg) in the evening.   Indication:  mood stabilization  ARIPiprazole 400 MG Susr  Inject 400 mg into the muscle every 28 (twenty-eight) days. Last dose given 03/11/2015.  Next dose due 04/08/15   Indication:  Schizophrenia     aspirin 81 MG EC tablet  Take 1 tablet (81 mg total) by mouth daily.   Indication:  cardiac health     benztropine 0.5 MG tablet  Commonly known as:  COGENTIN  Take one tab (0.5 mg) with breakfast.  Then take another tab (0.5 mg) in the evening.   Indication:  Extrapyramidal Reaction caused by Medications     lisinopril 5 MG tablet  Commonly known as:  PRINIVIL,ZESTRIL  Take 1 tablet (5 mg total) by mouth daily.   Indication:  High Blood Pressure     nicotine 21 mg/24hr patch  Commonly known as:  NICODERM CQ - dosed in mg/24 hours  Place 1 patch (21 mg total) onto the skin daily.   Indication:  Nicotine Addiction     pravastatin 40 MG tablet  Commonly known as:  PRAVACHOL  Take 1 tablet (40 mg total) by mouth daily.   Indication:  high cholesterol     ticagrelor 90 MG Tabs tablet  Commonly known as:  BRILINTA  Take 1 tablet (90 mg total) by mouth 2 (two) times daily.   Indication:  Acute Coronary Syndrome     traZODone 100 MG tablet  Commonly known as:  DESYREL  Take 1 tablet (100 mg total) by mouth at bedtime as needed for sleep.   Indication:  Trouble Sleeping       Follow-up Information    Follow up with [facility].   Specialty:  Behavioral  Health   Why:  Go to the walk-in clinic M-F between 8 and 10AM for your hospital follow up appointment   Contact information:   [address] [place] [address] [phone]       Follow-up recommendations:  Activity:  as tol Diet:  as tol  Comments:  1.  Take all your medications as prescribed.              2.  Report any adverse side effects to outpatient provider.                       3.  Patient instructed to not use alcohol or illegal drugs while on prescription medicines.            4.  In the event of worsening symptoms, instructed patient to call 911, the crisis hotline or go to nearest emergency room for evaluation of symptoms.  Signed: Lindwood Qua, NP [facility] 03/11/2015, 1:56 PM

## 2015-03-11 NOTE — Progress Notes (Signed)
D: Pt presents appropriate in affect and mood. Pt is visible and active within the milieu. Pt denied any SI/HI/AVH. Pt did not appear to be responding to any internal stimuli. Pt reports improvement in his overall presenting factors from admission. Pt's gait was steady. Pt had no physical complaints. Pt is looking forward to discharging soon. Pt was remorseful for smoking a cigarette that was brought in from his gf. See previus shift note for details. Pt is vested in his tx.  A: Writer administered scheduled and prn medications to pt, per MD orders. Continued support and availability as needed was extended to this pt. Staff continues to monitor pt with q46min checks.  R: No adverse drug reactions noted. Pt receptive to treatment. Pt remains safe at this time.

## 2015-03-11 NOTE — Tx Team (Signed)
Interdisciplinary Treatment Plan Update (Adult)  Date:  03/11/2015   Time Reviewed:  9:36 AM   Progress in Treatment: Attending groups: Yes. Participating in groups:  Yes. Taking medication as prescribed:  Yes. Tolerating medication:  Yes. Family/Significant other contact made:  Yes with significant other Patient understands diagnosis:  Yes  As evidenced by seeking help with command hallucinations Discussing patient identified problems/goals with staff:  Yes, see initial care plan. Medical problems stabilized or resolved:  Yes. Denies suicidal/homicidal ideation: Yes. Issues/concerns per patient self-inventory:  No. Other:  New problem(s) identified: None identified at this time.   Discharge Plan or Barriers: see bleow  Reason for Continuation of Hospitalization: Hallucinations Medication stabilization  Comments:  : Pt is a 40 yr old vol admitted for AH to kill himself and his GF. Pt reports that the [facility] are making him vomit as well. Pt became nauseated during the assessment. Pt did not vomit. Pt denies any illicit drug use. He reports rare alcohol use. Pt's uds was (+) for THC. ETOH <5. Pt been off his medications for 2 weeks. Pt reports that he started hearing voices after having a heart attack about 4-5 years ago. Per pt, he's been feeling "haunted" since his heart attack this past December. Pt oriented to the unit polices and proceduresPt today with some improvement of his sx. Will continue treatment. Daily contact with patient to assess and evaluate symptoms and progress in treatment and Medication management  Will DC 1;1 precaution for safety since pt is improved. Will continue to encourage PO fluids . Will discontinue Zyprexa , patient prefers to be on Abilify. Will start Abilify 5 mg po daily qhs , increase to 5 mg po bid for psychosis tomorrow. Will start Cogentin 0.5 mg po bid for EPS. Will continue Klonopin 0.5 mg po tid for anxiety sx. Will add Trazodone 100 mg po qhs prn  for sleep.   Estimated length of stay: 0 days  New goal(s):  Review of initial/current patient goals per problem list:   Review of initial/current patient goals per problem list:  1. Goal(s): Patient will participate in aftercare plan   Met: Yes   Target date: 3-5 days post admission date   As evidenced by: Patient will participate within aftercare plan AEB aftercare provider and housing plan at discharge being identified. 03/07/15;  Return home, follow up [facility]   2. Goal (s): Patient will exhibit decreased depressive symptoms and suicidal ideations.   Met: Yes   Target date: 3-5 days post admission date   As evidenced by: Patient will utilize self rating of depression at 3 or below and demonstrate decreased signs of depression or be deemed stable for discharge by MD. 03/07/15:  Denies SI, rates depression a 3 today    5. Goal(s): Patient will demonstrate decreased signs of psychosis  * Met: Yes  * Target date: 3-5 days post admission date  * As evidenced by: Patient will demonstrate decreased frequency of AVH or return to baseline function 03/07/15:  No meds prior to admission.  Command hallucinations prior to admission that was causing the pt distress.  Agreed to Abilify here, and is saying command is gone. 03/11/15: Pt denies AVH, reports that command hallucinations have subsided. Has been compliant with medications.   Attendees: Patient:  03/11/2015 9:36 AM   Family:   03/11/2015 9:36 AM   Physician:  Ursula Alert, MD 03/11/2015 9:36 AM   Nursing:   Manuella Ghazi, RN; Benjamine Mola, RN 03/11/2015 9:36 AM   CSW:  Peri Maris, [place]   03/11/2015 9:36 AM   Other:  03/11/2015 9:36 AM   Other:   03/11/2015 9:36 AM   Other:  Lars Pinks, Nurse CM 03/11/2015 9:36 AM   Other:   03/11/2015 9:36 AM   Other:  Norberto Sorenson, [place]  03/11/2015 9:36 AM   Other:  03/11/2015 9:36 AM   Other:  03/11/2015 9:36 AM   Other:  03/11/2015 9:36 AM   Other:  03/11/2015 9:36 AM    Other:  03/11/2015 9:36 AM   Other:   03/11/2015 9:36 AM    Scribe for Treatment Team:   Peri Maris, [place] Work [phone]

## 2015-03-11 NOTE — BHH Suicide Risk Assessment (Signed)
[facility] Discharge Suicide Risk Assessment   Principal Problem: Schizoaffective disorder, bipolar type [facility]) Discharge Diagnoses:  Patient Active Problem List   Diagnosis Date Noted  . Schizoaffective disorder, bipolar type (HCC) [F25.0] 03/06/2015  . Nausea with vomiting [R11.2] 03/06/2015  . Hypertensive heart disease [I11.9] 01/11/2015  . ST elevation myocardial infarction (STEMI) of inferior wall (HCC) [I21.19] 01/10/2015  . ST elevation myocardial infarction (STEMI) of inferior wall, initial episode of care (HCC) [I21.19] 01/10/2015  . Medical non-compliance [Z91.19]   . STEMI (ST elevation myocardial infarction) (HCC) [I21.3] 01/15/2014  . Hx of medication noncompliance [Z91.19]   . NSVT (nonsustained ventricular tachycardia) (HCC) [I47.2]   . Ischemic cardiomyopathy [I25.5]   . Tobacco abuse [Z72.0]   . Anxiety [F41.9]   . 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 DES PCI to RCA x 2 (3.0 mm x 22 mm Resolute - distal & proximal RCA) [Z95.5] 01/14/2014    Class: Present on Admission  . Non-STEMI (non-ST elevated myocardial infarction) (HCC) [I21.4] 07/22/2013  . Atypical chest pain [R07.89] 07/21/2013  . Bipolar 1 disorder, depressed (HCC) [F31.9] 07/21/2013  . Chest pain [R07.9] 04/16/2013  . HLD (hyperlipidemia) [E78.5] 04/16/2013  . Hyperlipidemia with target LDL less than 70 [E78.5] 01/27/2009  . SUBSTANCE ABUSE [F19.10] 01/27/2009  . DEPRESSION [F32.9] 01/27/2009  . Essential hypertension [I10] 01/27/2009  . CAD S/P BMS PCI to RCA with PTCA for ISR --> followed by stent thrombosis - DES PCI [I25.10, Z98.61] 01/27/2009  . GASTRITIS [K29.70, K29.90] 01/27/2009    Total Time spent with patient: 30 minutes  Musculoskeletal: Strength & Muscle Tone: within normal limits Gait & Station: normal Patient leans: N/A  Psychiatric Specialty Exam: Review of Systems  Psychiatric/Behavioral: Negative for depression and hallucinations. The patient is not nervous/anxious.    All other systems reviewed and are negative.   Blood pressure 103/74, pulse 123, temperature 98.3 F (36.8 C), temperature source Oral, resp. rate 20, height 5' 9.5" (1.765 m), weight 51.483 kg (113 lb 8 oz), SpO2 100 %.Body mass index is 16.53 kg/(m^2).  General Appearance: Casual  Eye Contact::  Fair  Speech:  Normal Rate409  Volume:  Normal  Mood:  Euthymic  Affect:  Appropriate  Thought Process:  Coherent  Orientation:  Full (Time, Place, and Person)  Thought Content:  WDL  Suicidal Thoughts:  No  Homicidal Thoughts:  No  Memory:  Immediate;   Fair Recent;   Fair Remote;   Fair  Judgement:  Fair  Insight:  Fair  Psychomotor Activity:  Normal  Concentration:  Fair  Recall:  [employer] of Knowledge:Fair  Language: Fair  Akathisia:  No  Handed:  Right  AIMS (if indicated):   0  Assets:  Communication Skills Desire for Improvement  Sleep:  Number of Hours: 6.75  Cognition: WNL  ADL's:  Intact   Mental Status Per Nursing Assessment::   On Admission:     Demographic Factors:  Male  Loss Factors: NA  Historical Factors: Impulsivity  Risk Reduction Factors:   Living with another person, especially a relative, Positive social support and Positive therapeutic relationship  Continued Clinical Symptoms:  Unstable or Poor Therapeutic Relationship Previous Psychiatric Diagnoses and Treatments Medical Diagnoses and Treatments/Surgeries  Cognitive Features That Contribute To Risk:  None    Suicide Risk:  Minimal: No identifiable suicidal ideation.  Patients presenting with no risk factors but with morbid ruminations; may be classified as minimal risk based on the severity of the depressive symptoms  Follow-up Information  Follow up with [facility].   Specialty:  Behavioral Health   Why:  Go to the walk-in clinic M-F between 8 and 10AM for your hospital follow up appointment   Contact information:   [address] [place] [address] [phone]        Plan Of Care/Follow-up recommendations:  Activity:  no restrictions Diet:  regular Tests:  as per out patient recommendations Other:  continue Abilify Maintena IM 400 mg - first dose 03/10/15 - repeat q28 days  Melissia Lahman, MD 03/11/2015, 9:35 AM

## 2015-03-11 NOTE — BHH Group Notes (Signed)
BHH Group Notes:  (Nursing/MHT/Case Management/Adjunct)  Date:  03/11/2015  Time:  9:30am  Type of Therapy:  Nurse Education  Participation Level:  Active  Participation Quality:  Attentive and Sharing  Affect:  Appropriate  Cognitive:  Alert  Insight:  Limited  Engagement in Group:  Improving and Supportive  Modes of Intervention:  Discussion and Education  Summary of Progress/Problems:  Group topic today was recovery.  Discussed what recovery means to the group.  Discussed long term and short term goals.  Reviewed sleep hygiene.  Dustyn talked about using white noise machine when trying to sleep.  He also was able to talk appropriate about the group topic.  He is looking forward to being discharged soon.

## 2015-06-01 ENCOUNTER — Emergency Department (HOSPITAL_COMMUNITY)
Admission: EM | Admit: 2015-06-01 | Discharge: 2015-06-01 | Disposition: A | Discharge status: Home / Self Care | Payer: Self-pay | Attending: Emergency Medicine | Admitting: Emergency Medicine

## 2015-06-01 ENCOUNTER — Encounter (HOSPITAL_COMMUNITY): Payer: Self-pay

## 2015-06-01 DIAGNOSIS — F319 Bipolar disorder, unspecified: Secondary | ICD-10-CM | POA: Insufficient documentation

## 2015-06-01 DIAGNOSIS — Z7982 Long term (current) use of aspirin: Secondary | ICD-10-CM | POA: Insufficient documentation

## 2015-06-01 DIAGNOSIS — E785 Hyperlipidemia, unspecified: Secondary | ICD-10-CM | POA: Insufficient documentation

## 2015-06-01 DIAGNOSIS — Z955 Presence of coronary angioplasty implant and graft: Secondary | ICD-10-CM | POA: Insufficient documentation

## 2015-06-01 DIAGNOSIS — I2510 Atherosclerotic heart disease of native coronary artery without angina pectoris: Secondary | ICD-10-CM | POA: Insufficient documentation

## 2015-06-01 DIAGNOSIS — F17210 Nicotine dependence, cigarettes, uncomplicated: Secondary | ICD-10-CM | POA: Insufficient documentation

## 2015-06-01 DIAGNOSIS — I10 Essential (primary) hypertension: Secondary | ICD-10-CM | POA: Insufficient documentation

## 2015-06-01 DIAGNOSIS — Z7902 Long term (current) use of antithrombotics/antiplatelets: Secondary | ICD-10-CM | POA: Insufficient documentation

## 2015-06-01 DIAGNOSIS — R112 Nausea with vomiting, unspecified: Secondary | ICD-10-CM | POA: Insufficient documentation

## 2015-06-01 DIAGNOSIS — Z79899 Other long term (current) drug therapy: Secondary | ICD-10-CM | POA: Insufficient documentation

## 2015-06-01 LAB — COMPREHENSIVE METABOLIC PANEL
ALBUMIN: 4.7 g/dL (ref 3.5–5.0)
ALT: 20 U/L (ref 17–63)
AST: 19 U/L (ref 15–41)
Alkaline Phosphatase: 69 U/L (ref 38–126)
Anion gap: 13 (ref 5–15)
BUN: 9 mg/dL (ref 6–20)
CHLORIDE: 93 mmol/L — AB (ref 101–111)
CO2: 30 mmol/L (ref 22–32)
Calcium: 10.3 mg/dL (ref 8.9–10.3)
Creatinine, Ser: 1.35 mg/dL — ABNORMAL HIGH (ref 0.61–1.24)
GFR calc Af Amer: >60 mL/min (ref >60)
GFR calc non Af Amer: >60 mL/min (ref >60)
GLUCOSE: 116 mg/dL — AB (ref 65–99)
POTASSIUM: 3.7 mmol/L (ref 3.5–5.1)
Sodium: 136 mmol/L (ref 135–145)
Total Bilirubin: 1 mg/dL (ref 0.3–1.2)
Total Protein: 8.2 g/dL — ABNORMAL HIGH (ref 6.5–8.1)

## 2015-06-01 LAB — CBC
HEMATOCRIT: 46.8 % (ref 39.0–52.0)
Hemoglobin: 16.4 g/dL (ref 13.0–17.0)
MCH: 32.2 pg (ref 26.0–34.0)
MCHC: 35 g/dL (ref 30.0–36.0)
MCV: 91.9 fL (ref 78.0–100.0)
Platelets: 256 10*3/uL (ref 150–400)
RBC: 5.09 MIL/uL (ref 4.22–5.81)
RDW: 14 % (ref 11.5–15.5)
WBC: 5 10*3/uL (ref 4.0–10.5)

## 2015-06-01 LAB — LIPASE, BLOOD: LIPASE: 17 U/L (ref 11–51)

## 2015-06-01 MED ORDER — ONDANSETRON HCL 4 MG/2ML IJ SOLN
4.0000 mg | Freq: Once | INTRAMUSCULAR | Status: AC
  Administered 2015-06-01: 4 mg via INTRAVENOUS
  Filled 2015-06-01: qty 2

## 2015-06-01 MED ORDER — SODIUM CHLORIDE 0.9 % IV BOLUS (SEPSIS)
1000.0000 mL | Freq: Once | INTRAVENOUS | Status: AC
Start: 2015-06-01 — End: 2015-06-01
  Administered 2015-06-01: 1000 mL via INTRAVENOUS

## 2015-06-01 MED ORDER — ONDANSETRON HCL 4 MG/2ML IJ SOLN: 4.0000 mg | Freq: Once | INTRAMUSCULAR | Status: DC | PRN

## 2015-06-01 MED ORDER — PROMETHAZINE HCL 25 MG PO TABS: 25.0000 mg | ORAL_TABLET | Freq: Four times a day (QID) | ORAL | Status: DC | PRN

## 2015-06-01 NOTE — Discharge Instructions (Signed)

## 2015-06-01 NOTE — ED Notes (Signed)
Pt cannot use restroom at this time, aware urine specimen is needed.  

## 2015-06-01 NOTE — ED Notes (Signed)
Per EMS, pt here from home.  C/o with abdominal pain and emesis x 1 week.  No fever.  Vitals: 132/84, hr 72, resp 16, cbg 117

## 2015-06-01 NOTE — ED Notes (Signed)
Pt stated still unable to provide urine sample, urinal and call light at bedside

## 2015-06-01 NOTE — ED Provider Notes (Signed)
CSN: [id number]     Arrival date & time 06/01/15  1006 History   First MD Initiated Contact with Patient 06/01/15 1148     Chief Complaint  Patient presents with  . Abdominal Pain  . Emesis     Patient is a 40 y.o. male presenting with abdominal pain and vomiting. The history is provided by the patient.  Abdominal Pain Associated symptoms: fatigue, nausea and vomiting   Associated symptoms: no chest pain, no diarrhea and no shortness of breath   Emesis Associated symptoms: abdominal pain   Associated symptoms: no diarrhea   Patient presents with abdominal pain nausea and vomiting. States she's had for last week. States he's been able to eat. No fevers or chills. Patient is in his upper abdomen. States he has had pains like this in the past when he wanted to kill himself. No diarrhea. No blood in the stool. He has had a decreased appetite. States he does not feel depressed or want to kill himself at this time.  Past Medical History  Diagnosis Date  . Bipolar 1 disorder (HCC)   . Hypertension   . Hyperlipidemia   . Hx of medication noncompliance   . Polysubstance abuse     a. h/o tobacco, marijuana and crack cocaine use. b. 07/2013: +UDS THC, neg for cocaine.  . Anxiety   . CAD S/P percutaneous coronary angioplasty     a. inferior STEMI s/p BMS to RCA 01/2007. b. NSTEMI 07/2013 s/p PTCA to RCA for restenosis (no stenting due to hx noncompliance). c. Transient inferior ST elevation (peak troponin 0.25) 01/2014 s/p PTCA/DES to prox RCA, PTCA/DES to distal RCA, EF 50%; d. 12/2014 Inf STEMI: LM nl,LAD min irregs, D1 60ost, LCX 45p, 44m, OM1/2/3 nl, RCA patent prox stent, 145m/d (3.0x32 Synergy DES), EF 35-45%.  Marland Kitchen NSVT (nonsustained ventricular tachycardia) (HCC)     a. Very brief run during 07/2013 admit for NSTEMI felt due to MI.  . Ischemic cardiomyopathy     a. EF 40% in 2011, 60% in 2012. b. EF 55% by cath 07/2013. c. EF 50% by cath 01/2014; d. 12/2014 EF 35-45% by LV gram.  . Tobacco abuse     Past Surgical History  Procedure Laterality Date  . Femoral artery stent    . Coronary angioplasty with stent placement  02/05/2007    3.5x51mm Quantum non-DES to RCA (Dr. Nicki Guadalajara)  . Cardiac catheterization  01/16/2009    normal left main, Cfx with 2-OMs both w/minor irregularities, LAd with 20-30% mid region irregularities, ramus intermediate/optional diagonal with 60% osital narrowing, RCA with stent in distal portion w/20% prox in-stent stenosis (Dr. Mervyn Skeeters. Little)  . Cardiac catheterization  07/23/2013    two vessel obstructive CAD, occluded first diagonal, focal in-stent restenosis in distal RCA (Dr. Peter [place])  . Left heart catheterization with coronary angiogram N/A 07/23/2013    Procedure: LEFT HEART CATHETERIZATION WITH CORONARY ANGIOGRAM;  Surgeon: Peter M [place], MD;  Location: [facility] CATH LAB;  Service: Cardiovascular;  Laterality: N/A;  . Left and right heart catheterization with coronary angiogram N/A 01/14/2014    Procedure: LEFT AND RIGHT HEART CATHETERIZATION WITH CORONARY ANGIOGRAM;  Surgeon: Kathleene Hazel, MD;  Location: [facility] CATH LAB;  Service: Cardiovascular;  Laterality: N/A;  . Cardiac catheterization N/A 01/10/2015    Procedure: Left Heart Cath and Coronary Angiography;  Surgeon: Marykay Lex, MD;  Location[facility] INVASIVE CV LAB;  Service: Cardiovascular;  Laterality: N/A;  . Cardiac catheterization N/A 01/10/2015  Procedure: Coronary Stent Intervention;  Surgeon: Marykay Lexavid W Harding, MD;  [facility];  Service: Cardiovascular;  Laterality: N/A;   Family History  Problem Relation Age of Onset  . CAD Mother   . Heart disease Mother   . Heart attack Mother   . Schizophrenia Mother   . CAD Sister    Social History  Substance Use Topics  . Smoking status: Current Every Day Smoker -- 0.25 packs/day    Types: Cigarettes  . Smokeless tobacco: None  . Alcohol Use: 0.0 oz/week    0 Standard drinks or equivalent per week    Review of Systems   Constitutional: Positive for appetite change and fatigue.  Respiratory: Negative for shortness of breath.   Cardiovascular: Negative for chest pain.  Gastrointestinal: Positive for nausea, vomiting and abdominal pain. Negative for diarrhea.  Genitourinary: Negative for flank pain.  Musculoskeletal: Negative for back pain.  Skin: Negative for wound.  Psychiatric/Behavioral: Negative for suicidal ideas and hallucinations.      Allergies  Iodine; Shellfish allergy; and Contrast media  Home Medications   Prior to Admission medications   Medication Sig Start Date End Date Taking? Authorizing Provider  ARIPiprazole (ABILIFY) 5 MG tablet Take one tab (5 mg) in the morning.  Then tale 1 tab (5 mg) in the evening. 03/11/15  Yes Adonis BrookSheila Agustin, NP  ARIPiprazole 400 MG SUSR Inject 400 mg into the muscle every 28 (twenty-eight) days. Last dose given 03/11/2015.  Next dose due 04/08/15 03/11/15  Yes Adonis BrookSheila Agustin, NP  aspirin EC 81 MG EC tablet Take 1 tablet (81 mg total) by mouth daily. 03/11/15  Yes Adonis BrookSheila Agustin, NP  benztropine (COGENTIN) 0.5 MG tablet Take one tab (0.5 mg) with breakfast.  Then take another tab (0.5 mg) in the evening. 03/11/15  Yes Adonis BrookSheila Agustin, NP  lisinopril (PRINIVIL,ZESTRIL) 5 MG tablet Take 1 tablet (5 mg total) by mouth daily. 03/11/15  Yes Adonis BrookSheila Agustin, NP  pravastatin (PRAVACHOL) 40 MG tablet Take 1 tablet (40 mg total) by mouth daily. 03/11/15  Yes Adonis BrookSheila Agustin, NP  ticagrelor (BRILINTA) 90 MG TABS tablet Take 1 tablet (90 mg total) by mouth 2 (two) times daily. 03/11/15  Yes Adonis BrookSheila Agustin, NP  traZODone (DESYREL) 100 MG tablet Take 1 tablet (100 mg total) by mouth at bedtime as needed for sleep. 03/11/15  Yes Adonis BrookSheila Agustin, NP  nicotine (NICODERM CQ - DOSED IN MG/24 HOURS) 21 mg/24hr patch Place 1 patch (21 mg total) onto the skin daily. Patient not taking: Reported on 06/01/2015 03/11/15   Adonis BrookSheila Agustin, NP  promethazine (PHENERGAN) 25 MG tablet Take 1 tablet (25 mg  total) by mouth every 6 (six) hours as needed for nausea. 06/01/15   Benjiman CoreNathan Marshayla Mitschke, MD   BP 149/98 mmHg  Pulse 75  Temp(Src) 97.6 F (36.4 C) (Oral)  Resp 18  SpO2 100% Physical Exam  Constitutional: He appears well-developed.  HENT:  Head: Atraumatic.  Neck: Neck supple.  Cardiovascular: Normal rate.   Pulmonary/Chest: Effort normal.  Abdominal: There is tenderness.  Epigastric left upper quadrant tenderness without rebound or guarding.  Musculoskeletal: He exhibits no edema.  Neurological: He is alert.  Skin: Skin is warm.    ED Course  Procedures (including critical care time) Labs Review Labs Reviewed  COMPREHENSIVE METABOLIC PANEL - Abnormal; Notable for the following:    Chloride 93 (*)    Glucose, Bld 116 (*)    Creatinine, Ser 1.35 (*)    Total Protein 8.2 (*)  All other components within normal limits  LIPASE, BLOOD  CBC    Imaging Review No results found. I have personally reviewed and evaluated these images and lab results as part of my medical decision-making.   EKG Interpretation None      MDM   Final diagnoses:  Nausea and vomiting, intractability of vomiting not specified, unspecified vomiting type    Patient with nausea and vomiting abdominal pain. Feels better after IV fluids. States he had the same in the past when he was suicidal. Not suicidal at this time. Discharge home.    Benjiman Core, MD 06/01/15 1750

## 2015-06-10 ENCOUNTER — Observation Stay (HOSPITAL_COMMUNITY)
Admission: AD | Admit: 2015-06-10 | Discharge: 2015-06-11 | Disposition: A | Discharge status: Home / Self Care | Payer: Self-pay | Attending: Cardiovascular Disease | Admitting: Cardiovascular Disease

## 2015-06-10 ENCOUNTER — Encounter (HOSPITAL_COMMUNITY): Payer: Self-pay | Admitting: General Practice

## 2015-06-10 ENCOUNTER — Encounter (HOSPITAL_COMMUNITY)
Admission: AD | Disposition: A | Discharge status: Home / Self Care | Payer: Self-pay | Source: Home / Self Care | Attending: Cardiovascular Disease

## 2015-06-10 DIAGNOSIS — Z9119 Patient's noncompliance with other medical treatment and regimen: Secondary | ICD-10-CM

## 2015-06-10 DIAGNOSIS — Z79899 Other long term (current) drug therapy: Secondary | ICD-10-CM | POA: Insufficient documentation

## 2015-06-10 DIAGNOSIS — E785 Hyperlipidemia, unspecified: Secondary | ICD-10-CM | POA: Insufficient documentation

## 2015-06-10 DIAGNOSIS — I2510 Atherosclerotic heart disease of native coronary artery without angina pectoris: Secondary | ICD-10-CM | POA: Insufficient documentation

## 2015-06-10 DIAGNOSIS — Z9114 Patient's other noncompliance with medication regimen: Secondary | ICD-10-CM | POA: Insufficient documentation

## 2015-06-10 DIAGNOSIS — I255 Ischemic cardiomyopathy: Secondary | ICD-10-CM | POA: Insufficient documentation

## 2015-06-10 DIAGNOSIS — R072 Precordial pain: Principal | ICD-10-CM | POA: Insufficient documentation

## 2015-06-10 DIAGNOSIS — I10 Essential (primary) hypertension: Secondary | ICD-10-CM | POA: Insufficient documentation

## 2015-06-10 DIAGNOSIS — F250 Schizoaffective disorder, bipolar type: Secondary | ICD-10-CM | POA: Insufficient documentation

## 2015-06-10 DIAGNOSIS — Z955 Presence of coronary angioplasty implant and graft: Secondary | ICD-10-CM | POA: Insufficient documentation

## 2015-06-10 DIAGNOSIS — Z7902 Long term (current) use of antithrombotics/antiplatelets: Secondary | ICD-10-CM | POA: Insufficient documentation

## 2015-06-10 DIAGNOSIS — I252 Old myocardial infarction: Secondary | ICD-10-CM | POA: Insufficient documentation

## 2015-06-10 DIAGNOSIS — Z9861 Coronary angioplasty status: Secondary | ICD-10-CM

## 2015-06-10 DIAGNOSIS — Z7982 Long term (current) use of aspirin: Secondary | ICD-10-CM | POA: Insufficient documentation

## 2015-06-10 DIAGNOSIS — F17210 Nicotine dependence, cigarettes, uncomplicated: Secondary | ICD-10-CM | POA: Insufficient documentation

## 2015-06-10 DIAGNOSIS — Z590 Homelessness: Secondary | ICD-10-CM | POA: Insufficient documentation

## 2015-06-10 HISTORY — PX: CARDIAC CATHETERIZATION: SHX172

## 2015-06-10 LAB — POCT I-STAT, CHEM 8
BUN: 3 mg/dL — ABNORMAL LOW (ref 6–20)
CALCIUM ION: 1.22 mmol/L (ref 1.12–1.23)
CREATININE: 0.8 mg/dL (ref 0.61–1.24)
Chloride: 100 mmol/L — ABNORMAL LOW (ref 101–111)
GLUCOSE: 95 mg/dL (ref 65–99)
HEMATOCRIT: 40 % (ref 39.0–52.0)
HEMOGLOBIN: 13.6 g/dL (ref 13.0–17.0)
Potassium: 3.6 mmol/L (ref 3.5–5.1)
Sodium: 138 mmol/L (ref 135–145)
TCO2: 28 mmol/L (ref 0–100)

## 2015-06-10 LAB — CBC
HCT: 39.2 % (ref 39.0–52.0)
Hemoglobin: 12.8 g/dL — ABNORMAL LOW (ref 13.0–17.0)
MCH: 30.5 pg (ref 26.0–34.0)
MCHC: 32.7 g/dL (ref 30.0–36.0)
MCV: 93.6 fL (ref 78.0–100.0)
PLATELETS: 261 10*3/uL (ref 150–400)
RBC: 4.19 MIL/uL — AB (ref 4.22–5.81)
RDW: 14.5 % (ref 11.5–15.5)
WBC: 6.4 10*3/uL (ref 4.0–10.5)

## 2015-06-10 LAB — CREATININE, SERUM
CREATININE: 1.04 mg/dL (ref 0.61–1.24)
GFR calc non Af Amer: >60 mL/min (ref >60)

## 2015-06-10 LAB — TROPONIN I
Troponin I: <0.03 ng/mL (ref <0.031)
Troponin I: 0.03 ng/mL (ref <0.031)

## 2015-06-10 SURGERY — LEFT HEART CATH AND CORONARY ANGIOGRAPHY

## 2015-06-10 MED ORDER — ARIPIPRAZOLE 5 MG PO TABS
5.0000 mg | ORAL_TABLET | Freq: Every day | ORAL | Status: DC
  Administered 2015-06-10 – 2015-06-11 (×2): 5 mg via ORAL
  Filled 2015-06-10 (×2): qty 1

## 2015-06-10 MED ORDER — TICAGRELOR 90 MG PO TABS
90.0000 mg | ORAL_TABLET | Freq: Two times a day (BID) | ORAL | Status: DC
  Administered 2015-06-10 – 2015-06-11 (×3): 90 mg via ORAL
  Filled 2015-06-10 (×3): qty 1

## 2015-06-10 MED ORDER — METHYLPREDNISOLONE SODIUM SUCC 125 MG IJ SOLR
INTRAMUSCULAR | Status: DC | PRN
  Administered 2015-06-10: 125 mg via INTRAVENOUS

## 2015-06-10 MED ORDER — BENZTROPINE MESYLATE 0.5 MG PO TABS
0.5000 mg | ORAL_TABLET | Freq: Every day | ORAL | Status: DC
  Administered 2015-06-10 – 2015-06-11 (×2): 0.5 mg via ORAL
  Filled 2015-06-10 (×2): qty 1

## 2015-06-10 MED ORDER — SODIUM CHLORIDE 0.9% FLUSH: 3.0000 mL | INTRAVENOUS | Status: DC | PRN

## 2015-06-10 MED ORDER — HEPARIN SODIUM (PORCINE) 1000 UNIT/ML IJ SOLN
INTRAMUSCULAR | Status: AC
  Filled 2015-06-10: qty 1

## 2015-06-10 MED ORDER — PROMETHAZINE HCL 25 MG PO TABS: 25.0000 mg | ORAL_TABLET | Freq: Four times a day (QID) | ORAL | Status: DC | PRN

## 2015-06-10 MED ORDER — SODIUM CHLORIDE 0.9 % IV SOLN: 250.0000 mL | INTRAVENOUS | Status: DC | PRN

## 2015-06-10 MED ORDER — IOPAMIDOL (ISOVUE-370) INJECTION 76%
INTRAVENOUS | Status: DC | PRN
  Administered 2015-06-10: 35 mL via INTRAVENOUS

## 2015-06-10 MED ORDER — VERAPAMIL HCL 2.5 MG/ML IV SOLN
INTRAVENOUS | Status: AC
  Filled 2015-06-10: qty 2

## 2015-06-10 MED ORDER — HEPARIN SODIUM (PORCINE) 5000 UNIT/ML IJ SOLN
5000.0000 [IU] | Freq: Three times a day (TID) | INTRAMUSCULAR | Status: DC
  Administered 2015-06-10 – 2015-06-11 (×3): 5000 [IU] via SUBCUTANEOUS
  Filled 2015-06-10 (×3): qty 1

## 2015-06-10 MED ORDER — ONDANSETRON HCL 4 MG/2ML IJ SOLN
4.0000 mg | Freq: Four times a day (QID) | INTRAMUSCULAR | Status: DC | PRN
  Administered 2015-06-10: 4 mg via INTRAVENOUS
  Filled 2015-06-10: qty 2

## 2015-06-10 MED ORDER — LIDOCAINE HCL (PF) 1 % IJ SOLN
INTRAMUSCULAR | Status: DC | PRN
  Administered 2015-06-10: 2 mL

## 2015-06-10 MED ORDER — FAMOTIDINE IN NACL 20-0.9 MG/50ML-% IV SOLN
INTRAVENOUS | Status: AC
  Filled 2015-06-10: qty 50

## 2015-06-10 MED ORDER — DIPHENHYDRAMINE HCL 50 MG/ML IJ SOLN
INTRAMUSCULAR | Status: AC
  Filled 2015-06-10: qty 1

## 2015-06-10 MED ORDER — HEPARIN (PORCINE) IN NACL 2-0.9 UNIT/ML-% IJ SOLN
INTRAMUSCULAR | Status: DC | PRN
  Administered 2015-06-10: 1000 mL

## 2015-06-10 MED ORDER — PRAVASTATIN SODIUM 40 MG PO TABS
40.0000 mg | ORAL_TABLET | Freq: Every day | ORAL | Status: DC
  Administered 2015-06-10 – 2015-06-11 (×2): 40 mg via ORAL
  Filled 2015-06-10 (×2): qty 1

## 2015-06-10 MED ORDER — FAMOTIDINE IN NACL 20-0.9 MG/50ML-% IV SOLN
INTRAVENOUS | Status: DC | PRN
  Administered 2015-06-10: 20 mg via INTRAVENOUS

## 2015-06-10 MED ORDER — ACETAMINOPHEN 325 MG PO TABS: 650.0000 mg | ORAL_TABLET | ORAL | Status: DC | PRN

## 2015-06-10 MED ORDER — TRAZODONE HCL 100 MG PO TABS
100.0000 mg | ORAL_TABLET | Freq: Every evening | ORAL | Status: DC | PRN
  Administered 2015-06-10: 100 mg via ORAL
  Filled 2015-06-10: qty 1

## 2015-06-10 MED ORDER — DIPHENHYDRAMINE HCL 50 MG/ML IJ SOLN
INTRAMUSCULAR | Status: DC | PRN
  Administered 2015-06-10: 25 mg via INTRAVENOUS

## 2015-06-10 MED ORDER — HEPARIN (PORCINE) IN NACL 2-0.9 UNIT/ML-% IJ SOLN
INTRAMUSCULAR | Status: AC
  Filled 2015-06-10: qty 1000

## 2015-06-10 MED ORDER — NITROGLYCERIN 0.4 MG SL SUBL: 0.4000 mg | SUBLINGUAL_TABLET | SUBLINGUAL | Status: DC | PRN

## 2015-06-10 MED ORDER — NITROGLYCERIN 1 MG/10 ML FOR IR/CATH LAB
INTRA_ARTERIAL | Status: AC
  Filled 2015-06-10: qty 10

## 2015-06-10 MED ORDER — LISINOPRIL 5 MG PO TABS
5.0000 mg | ORAL_TABLET | Freq: Every day | ORAL | Status: DC
  Administered 2015-06-10 – 2015-06-11 (×2): 5 mg via ORAL
  Filled 2015-06-10 (×2): qty 1

## 2015-06-10 MED ORDER — SODIUM CHLORIDE 0.9 % WEIGHT BASED INFUSION: 1.0000 mL/kg/h | INTRAVENOUS | Status: AC

## 2015-06-10 MED ORDER — METHYLPREDNISOLONE SODIUM SUCC 125 MG IJ SOLR
INTRAMUSCULAR | Status: AC
  Filled 2015-06-10: qty 2

## 2015-06-10 MED ORDER — ASPIRIN EC 81 MG PO TBEC
81.0000 mg | DELAYED_RELEASE_TABLET | Freq: Every day | ORAL | Status: DC
  Administered 2015-06-11: 81 mg via ORAL
  Filled 2015-06-10 (×2): qty 1

## 2015-06-10 MED ORDER — LIDOCAINE HCL (PF) 1 % IJ SOLN
INTRAMUSCULAR | Status: AC
  Filled 2015-06-10: qty 30

## 2015-06-10 MED ORDER — SODIUM CHLORIDE 0.9% FLUSH
3.0000 mL | Freq: Two times a day (BID) | INTRAVENOUS | Status: DC
  Administered 2015-06-10 – 2015-06-11 (×3): 3 mL via INTRAVENOUS

## 2015-06-10 MED ORDER — HEPARIN SODIUM (PORCINE) 1000 UNIT/ML IJ SOLN
INTRAMUSCULAR | Status: DC | PRN
  Administered 2015-06-10: 3000 [IU] via INTRAVENOUS

## 2015-06-10 MED ORDER — NICOTINE 21 MG/24HR TD PT24
21.0000 mg | MEDICATED_PATCH | Freq: Every day | TRANSDERMAL | Status: DC
  Administered 2015-06-10 – 2015-06-11 (×2): 21 mg via TRANSDERMAL
  Filled 2015-06-10 (×2): qty 1

## 2015-06-10 MED ORDER — VERAPAMIL HCL 2.5 MG/ML IV SOLN
INTRAVENOUS | Status: DC | PRN
  Administered 2015-06-10: 08:00:00 via INTRA_ARTERIAL

## 2015-06-10 SURGICAL SUPPLY — 11 items
CATH INFINITI 5 FR JL3.5 (CATHETERS) ×3 IMPLANT
CATH INFINITI JR4 5F (CATHETERS) ×3 IMPLANT
DEVICE RAD COMP TR BAND LRG (VASCULAR PRODUCTS) ×3 IMPLANT
ELECT DEFIB PAD ADLT CADENCE (PAD) ×3 IMPLANT
GLIDESHEATH SLEND SS 6F .021 (SHEATH) ×3 IMPLANT
KIT ENCORE 26 ADVANTAGE (KITS) ×3 IMPLANT
KIT HEART LEFT (KITS) ×3 IMPLANT
PACK CARDIAC CATHETERIZATION (CUSTOM PROCEDURE TRAY) ×3 IMPLANT
TRANSDUCER W/STOPCOCK (MISCELLANEOUS) ×6 IMPLANT
TUBING CIL FLEX 10 FLL-RA (TUBING) ×3 IMPLANT
WIRE SAFE-T 1.5MM-J .035X260CM (WIRE) ×3 IMPLANT

## 2015-06-10 NOTE — Progress Notes (Signed)
Patient arrived to 2W from cath lab, alert and oriented, without complication.  Telemetry applied.  CCMD notified.  Patient oriented to room including call light and telephone.  Patient denies pain currently.  No signs or symptoms of distress at present time.  TR-band intact with no bleeding noted.  Will continue to monitor.

## 2015-06-10 NOTE — H&P (Signed)
Patient ID: [name] MRN: [id number], DOB/AGE: 1975-04-13   Admit date: 06/10/2015   Primary Physician: No PCP Per Patient Primary Cardiologist: Dr. Tresa EndoKelly  Pt. Profile:  40 year old homeless male with past medical history of bipolar 1 disorder, schizoaffective disorder, hypertension, hyperlipidemia, history of medication noncompliance, history of polysubstance abuse, CAD status post multiple PCI to RCA, and ischemic cardiac myopathy with baseline EF 35-45% by LV gram presented with CP for 3 days and mildly elevated J point in anterior leads, Code STEMI activated  Problem List  Past Medical History  Diagnosis Date  . Bipolar 1 disorder (HCC)   . Hypertension   . Hyperlipidemia   . Hx of medication noncompliance   . Polysubstance abuse     a. h/o tobacco, marijuana and crack cocaine use. b. 07/2013: +UDS THC, neg for cocaine.  . Anxiety   . CAD S/P percutaneous coronary angioplasty     a. inferior STEMI s/p BMS to RCA 01/2007. b. NSTEMI 07/2013 s/p PTCA to RCA for restenosis (no stenting due to hx noncompliance). c. Transient inferior ST elevation (peak troponin 0.25) 01/2014 s/p PTCA/DES to prox RCA, PTCA/DES to distal RCA, EF 50%; d. 12/2014 Inf STEMI: LM nl,LAD min irregs, D1 60ost, LCX 45p, 681m, OM1/2/3 nl, RCA patent prox stent, 18082m/d (3.0x32 Synergy DES), EF 35-45%.  Marland Kitchen. NSVT (nonsustained ventricular tachycardia) (HCC)     a. Very brief run during 07/2013 admit for NSTEMI felt due to MI.  . Ischemic cardiomyopathy     a. EF 40% in 2011, 60% in 2012. b. EF 55% by cath 07/2013. c. EF 50% by cath 01/2014; d. 12/2014 EF 35-45% by LV gram.  . Tobacco abuse     Past Surgical History  Procedure Laterality Date  . Femoral artery stent    . Coronary angioplasty with stent placement  02/05/2007    3.5x7718mm Quantum non-DES to RCA (Dr. Nicki Guadalajarahomas Kelly)  . Cardiac catheterization  01/16/2009    normal left main, Cfx with 2-OMs both w/minor irregularities, LAd with 20-30% mid region  irregularities, ramus intermediate/optional diagonal with 60% osital narrowing, RCA with stent in distal portion w/20% prox in-stent stenosis (Dr. Mervyn SkeetersA. Little)  . Cardiac catheterization  07/23/2013    two vessel obstructive CAD, occluded first diagonal, focal in-stent restenosis in distal RCA (Dr. Teela Narducci [place])  . Left heart catheterization with coronary angiogram N/A 07/23/2013    Procedure: LEFT HEART CATHETERIZATION WITH CORONARY ANGIOGRAM;  Surgeon: Kameisha Malicki M SwazilandJordan, MD;  [facility];  Service: Cardiovascular;  Laterality: N/A;  . Left and right heart catheterization with coronary angiogram N/A 01/14/2014    Procedure: LEFT AND RIGHT HEART CATHETERIZATION WITH CORONARY ANGIOGRAM;  Surgeon: Kathleene Hazelhristopher D McAlhany, MD;  [facility];  Service: Cardiovascular;  Laterality: N/A;  . Cardiac catheterization N/A 01/10/2015    Procedure: Left Heart Cath and Coronary Angiography;  Surgeon: Marykay Lexavid W Harding, MD;  [facility];  Service: Cardiovascular;  Laterality: N/A;  . Cardiac catheterization N/A 01/10/2015    Procedure: Coronary Stent Intervention;  Surgeon: Marykay Lexavid W Harding, MD;  [facility];  Service: Cardiovascular;  Laterality: N/A;     Allergies  Allergies  Allergen Reactions  . Iodine Shortness Of Breath and Swelling  . Shellfish Allergy Anaphylaxis    All shellfish  . Contrast Media [Iodinated Diagnostic Agents] Nausea And Vomiting    HPI  The patient is a 40 year old homeless male with past medical history of bipolar 1 disorder,  schizoaffective disorder, hypertension, hyperlipidemia, history of medication noncompliance, history of polysubstance abuse, CAD s/p multiple PCI to RCA, and ischemic cardiomyopathy with baseline EF 35-45% by LV gram. He presents today as an anterior STEMI. He has a history of multiple PCI to RCA including bare metal stent to RCA in January 2009, PTCA in July 2015 for in-stent restenosis of RCA, DES 2 in January  2016. His last cardiac catheterization in December 2016 showed 100% mid to distal RCA in-stent restenosis treated with balloon angioplasty followed by Synergy DES, 40% proximal left circumflex, 40% mid left circumflex, 60% ostial D1, EF by LV gram 35-45%. Since then, he has been placed on aspirin and Brilinta.  He has failed to follow-up with cardiology group since intervention. He was admitted by psychiatry in February for suicidal ideation after claiming to want to suicide by laying on the railroad track. He ran out of Brilinta a week ago, for the past 3 days he started having substernal chest pain. He denies any fever, chill, or cough. Currently he is having 6 out of 10 chest pain. Initial EKG obtained by EMS showed mildly elevated J-point in anterior lead. Code STEMI was activated, patient was taken urgently to cardiac cath lab for diagnostic cardiac catheterization. Risk and benefit of this procedure has been explained, patient displayed clear understanding and agreed to proceed. He denies any recent bleeding issue including blood in stool or urine.   Home Medications  Prior to Admission medications   Medication Sig Start Date End Date Taking? Authorizing Provider  ARIPiprazole (ABILIFY) 5 MG tablet Take one tab (5 mg) in the morning.  Then tale 1 tab (5 mg) in the evening. 03/11/15   Adonis Brook, NP  ARIPiprazole 400 MG SUSR Inject 400 mg into the muscle every 28 (twenty-eight) days. Last dose given 03/11/2015.  Next dose due 04/08/15 03/11/15   Adonis Brook, NP  aspirin EC 81 MG EC tablet Take 1 tablet (81 mg total) by mouth daily. 03/11/15   Adonis Brook, NP  benztropine (COGENTIN) 0.5 MG tablet Take one tab (0.5 mg) with breakfast.  Then take another tab (0.5 mg) in the evening. 03/11/15   Adonis Brook, NP  lisinopril (PRINIVIL,ZESTRIL) 5 MG tablet Take 1 tablet (5 mg total) by mouth daily. 03/11/15   Adonis Brook, NP  nicotine (NICODERM CQ - DOSED IN MG/24 HOURS) 21 mg/24hr patch Place 1  patch (21 mg total) onto the skin daily. Patient not taking: Reported on 06/01/2015 03/11/15   Adonis Brook, NP  pravastatin (PRAVACHOL) 40 MG tablet Take 1 tablet (40 mg total) by mouth daily. 03/11/15   Adonis Brook, NP  promethazine (PHENERGAN) 25 MG tablet Take 1 tablet (25 mg total) by mouth every 6 (six) hours as needed for nausea. 06/01/15   Benjiman Core, MD  ticagrelor (BRILINTA) 90 MG TABS tablet Take 1 tablet (90 mg total) by mouth 2 (two) times daily. 03/11/15   Adonis Brook, NP  traZODone (DESYREL) 100 MG tablet Take 1 tablet (100 mg total) by mouth at bedtime as needed for sleep. 03/11/15   Adonis Brook, NP    Family History  Family History  Problem Relation Age of Onset  . CAD Mother   . Heart disease Mother   . Heart attack Mother   . Schizophrenia Mother   . CAD Sister     Social History  Social History   Social History  . Marital Status: Single    Spouse Name: N/A  . Number of Children: N/A  .  Years of Education: N/A   Occupational History  . Not on file.   Social History Main Topics  . Smoking status: Current Every Day Smoker -- 0.25 packs/day    Types: Cigarettes  . Smokeless tobacco: Not on file  . Alcohol Use: 0.0 oz/week    0 Standard drinks or equivalent per week  . Drug Use: Yes    Special: Marijuana     Comment: Marijuana (last used on 01/07/2015)  . Sexual Activity: Not Currently   Other Topics Concern  . Not on file   Social History Narrative     Review of Systems General:  No chills, fever, night sweats or weight changes.  Cardiovascular:  No dyspnea on exertion, edema, orthopnea, palpitations, paroxysmal nocturnal dyspnea. +chest pain Dermatological: No rash, lesions/masses Respiratory: No cough, dyspnea Urologic: No hematuria, dysuria Abdominal:   No nausea, vomiting, diarrhea, bright red blood per rectum, melena, or hematemesis Neurologic:  No visual changes, wkns, changes in mental status. All other systems reviewed and  are otherwise negative except as noted above.  Physical Exam  There were no vitals taken for this visit.  General: Pleasant, NAD Psych: Normal affect. Neuro: Alert and oriented X 3. Moves all extremities spontaneously. HEENT: Normal  Neck: Supple without bruits or JVD. Lungs:  Resp regular and unlabored, CTA. Heart: RRR no s3, s4, or murmurs. Abdomen: Soft, non-tender, non-distended, BS + x 4.  Extremities: No clubbing, cyanosis or edema. DP/PT/Radials 2+ and equal bilaterally.  Labs  Troponin (Point of Care Test) No results for input(s): TROPIPOC in the last 72 hours. No results for input(s): CKTOTAL, CKMB, TROPONINI in the last 72 hours. Lab Results  Component Value Date   WBC 5.0 06/01/2015   HGB 16.4 06/01/2015   HCT 46.8 06/01/2015   MCV 91.9 06/01/2015   PLT 256 06/01/2015   No results for input(s): NA, K, CL, CO2, BUN, CREATININE, CALCIUM, PROT, BILITOT, ALKPHOS, ALT, AST, GLUCOSE in the last 168 hours.  Invalid input(s): LABALBU Lab Results  Component Value Date   CHOL 203* 03/08/2015   HDL 45 03/08/2015   LDLCALC 139* 03/08/2015   TRIG 94 03/08/2015   Lab Results  Component Value Date   DDIMER  01/15/2009    0.22        AT THE INHOUSE ESTABLISHED CUTOFF VALUE OF 0.48 ug/mL FEU, THIS ASSAY HAS BEEN DOCUMENTED IN THE LITERATURE TO HAVE A SENSITIVITY AND NEGATIVE PREDICTIVE VALUE OF AT LEAST 98 TO 99%.  THE TEST RESULT SHOULD BE CORRELATED WITH AN ASSESSMENT OF THE CLINICAL PROBABILITY OF DVT / VTE.     Radiology/Studies  No results found.  ECG  NSR with mildly elevated J point in anterior leads   ASSESSMENT AND PLAN  1. Chest pain with elevated J point in anterior lead  - says he has persistent CP for 3 days, characteristic somewhat atypical, unclear ifEKG represent early repol vs anterior STEMI, previous cath did not show significant LAD stenosis. However given his h/o medication noncompliance, will need to assess with cardiac catheterization,  plan for urgent cath  - Risk and benefit of procedure explained to the patient who display clear understanding and agree to proceed. Discussed with patient possible procedural risk include bleeding, vascular injury, renal injury, arrythmia, MI, stroke and loss of limb or life.  2. CAD s/p BMS to RCA 01/2007, PTCA to RCA 2015, DESx2 to RCA 01/2014, DES to RCA 12/2014  3. ICM with baseline EF 35-45%  4. Medication noncompliance with continuous tobacco  abuse: will need cessation of tobacco and better compliance or his overall long term prognosis will be very poor given recurrent RCA NSTEMI/STEMI before age 5  5. HTN  6. HLD   Signed, Azalee Course, PA-C 06/10/2015, 7:32 AM Patient seen and examined and history reviewed. Agree with above findings and plan. 40 yo BM with history of CAD s/p multiple stents of the RCA dating back to 2009. Last stent in Jan. 2017. Now presents with 3 day history of chest pain. States he ran out of Brilinta one week ago. Ecg shows ST elevation in leads V3-4. This is similar to prior Ecgs but given ongoing chest pain, prior history, emergent cardiac cath recommended. Will need to resume DAPT. Counseling on smoking cessation.  Deetra Booton [place], MDFACC 06/10/2015 8:08 AM

## 2015-06-11 ENCOUNTER — Other Ambulatory Visit: Payer: Self-pay | Admitting: Physician Assistant

## 2015-06-11 ENCOUNTER — Observation Stay (HOSPITAL_BASED_OUTPATIENT_CLINIC_OR_DEPARTMENT_OTHER): Payer: MEDICAID

## 2015-06-11 DIAGNOSIS — I5022 Chronic systolic (congestive) heart failure: Secondary | ICD-10-CM

## 2015-06-11 DIAGNOSIS — R931 Abnormal findings on diagnostic imaging of heart and coronary circulation: Secondary | ICD-10-CM

## 2015-06-11 DIAGNOSIS — I2510 Atherosclerotic heart disease of native coronary artery without angina pectoris: Secondary | ICD-10-CM

## 2015-06-11 LAB — BASIC METABOLIC PANEL
ANION GAP: 6 (ref 5–15)
BUN: 6 mg/dL (ref 6–20)
CALCIUM: 9 mg/dL (ref 8.9–10.3)
CO2: 25 mmol/L (ref 22–32)
CREATININE: 0.97 mg/dL (ref 0.61–1.24)
Chloride: 105 mmol/L (ref 101–111)
Glucose, Bld: 104 mg/dL — ABNORMAL HIGH (ref 65–99)
Potassium: 4.3 mmol/L (ref 3.5–5.1)
SODIUM: 136 mmol/L (ref 135–145)

## 2015-06-11 LAB — CBC
HCT: 35.7 % — ABNORMAL LOW (ref 39.0–52.0)
HEMOGLOBIN: 11.9 g/dL — AB (ref 13.0–17.0)
MCH: 30.4 pg (ref 26.0–34.0)
MCHC: 33.3 g/dL (ref 30.0–36.0)
MCV: 91.3 fL (ref 78.0–100.0)
PLATELETS: 257 10*3/uL (ref 150–400)
RBC: 3.91 MIL/uL — AB (ref 4.22–5.81)
RDW: 14.1 % (ref 11.5–15.5)
WBC: 6.6 10*3/uL (ref 4.0–10.5)

## 2015-06-11 LAB — LIPID PANEL
CHOL/HDL RATIO: 4.5 ratio
CHOLESTEROL: 226 mg/dL — AB (ref 0–200)
HDL: 50 mg/dL (ref >40)
LDL Cholesterol: 161 mg/dL — ABNORMAL HIGH (ref 0–99)
TRIGLYCERIDES: 74 mg/dL (ref <150)
VLDL: 15 mg/dL (ref 0–40)

## 2015-06-11 LAB — ECHOCARDIOGRAM COMPLETE
HEIGHTINCHES: 72 in
WEIGHTICAEL: 2042.34 [oz_av]

## 2015-06-11 MED ORDER — LISINOPRIL 5 MG PO TABS: 5.0000 mg | ORAL_TABLET | Freq: Every day | ORAL | Status: DC

## 2015-06-11 MED ORDER — NITROGLYCERIN 0.4 MG SL SUBL: 0.4000 mg | SUBLINGUAL_TABLET | SUBLINGUAL | Status: DC | PRN

## 2015-06-11 MED ORDER — TICAGRELOR 90 MG PO TABS: 90.0000 mg | ORAL_TABLET | Freq: Two times a day (BID) | ORAL | Status: DC

## 2015-06-11 MED ORDER — PRAVASTATIN SODIUM 40 MG PO TABS: 40.0000 mg | ORAL_TABLET | Freq: Every day | ORAL | Status: DC

## 2015-06-11 MED ORDER — METOPROLOL SUCCINATE ER 25 MG PO TB24
25.0000 mg | ORAL_TABLET | Freq: Every day | ORAL | Status: DC
  Administered 2015-06-11: 25 mg via ORAL
  Filled 2015-06-11: qty 1

## 2015-06-11 MED ORDER — METOPROLOL SUCCINATE ER 25 MG PO TB24: 25.0000 mg | ORAL_TABLET | Freq: Every day | ORAL | Status: DC

## 2015-06-11 MED FILL — Nitroglycerin IV Soln 100 MCG/ML in D5W: INTRA_ARTERIAL | Qty: 10 | Status: AC

## 2015-06-11 NOTE — Care Management Note (Addendum)
Case Management Note  Patient Details  Name: [name] MRN: [id number] Date of Birth: 05-25-1975  Subjective/Objective:     Chest pain, hx CAD               Action/Plan: Discharge Planning: AVS reviewed:  NCM spoke to pt and states he received Brilinta from [employer] for a year and then delivery stop. [facility] and they need copy of his Medicaid denial letter to resume Brilinta. Contacted attending, Azalee CourseHao Meng PA and they have 3 week samples of Brilinta in the office. NCM did follow up with Medicaid DSS Caseworker and pt was denied for Medicaid. He failed to provide adequate documentation needed to process his application. CW will fax denial letter to NCM. Will fax letter to [employer]. Arranged appt with [facility] follow up appt on 07/07/2015 at 11 am. [facility] for $3.00 copay excludes Brilinta. Instructed pt to follow up with [employer] on assistance with receiving Brilinta under the patient assistance program free. CSW referral for bus passes for home. Pt provided bus passes for home.   1645 NCM picked up samples from office, given to PA, [employer] FinancialHao Meng. He will give Brilinta samples to pt for home.   Expected Discharge Date:  06/11/2015             Expected Discharge Plan:  Home/Self Care  In-House Referral:  Clinical Social Work  Discharge planning Services  CM Consult, Medication Assistance, Indigent Health Clinic  Post Acute Care Choice:  NA Choice offered to:  NA  DME Arranged:  N/A DME Agency:  NA  HH Arranged:  NA HH Agency:  NA  Status of Service:  Completed, signed off  Medicare Important Message Given:    Date Medicare IM Given:    Medicare IM give by:    Date Additional Medicare IM Given:    Additional Medicare Important Message give by:     If discussed at Long Length of Stay Meetings, dates discussed:    Additional Comments:  [name], [name], RN 06/11/2015, 3:51  PM

## 2015-06-11 NOTE — Progress Notes (Signed)
Patient Name: [name] J Cott Date of Encounter: 06/11/2015  Primary Cardiologist: Dr. Tresa EndoKelly  Pt. Profile:  40 year old homeless male with past medical history of bipolar 1 disorder, schizoaffective disorder, hypertension, hyperlipidemia, history of medication noncompliance, history of polysubstance abuse, CAD status post multiple PCI to RCA, and ischemic cardiac myopathy with baseline EF 35-45% by LV gram presented with CP for 3 days and mildly elevated J point in anterior leads, Code STEMI activated. No change on cath. J point elevation likely represent early repol   Principal Problem:   Chest pain Active Problems:   Hyperlipidemia with target LDL less than 70   Essential hypertension   CAD S/P BMS PCI to RCA with PTCA for ISR --> followed by stent thrombosis - DES PCI   Hx of medication noncompliance   Ischemic cardiomyopathy   Schizoaffective disorder, bipolar type (HCC)    SUBJECTIVE  Denies any CP or SOB.   CURRENT MEDS . ARIPiprazole  5 mg Oral Daily  . aspirin EC  81 mg Oral Daily  . benztropine  0.5 mg Oral Daily  . heparin  5,000 Units Subcutaneous Q8H  . lisinopril  5 mg Oral Daily  . nicotine  21 mg Transdermal Daily  . pravastatin  40 mg Oral Daily  . sodium chloride flush  3 mL Intravenous Q12H  . ticagrelor  90 mg Oral BID    OBJECTIVE  Filed Vitals:   06/10/15 1400 06/10/15 1500 06/10/15 2038 06/11/15 0643  BP:  122/78 149/92 103/64  Pulse:  73 72 85  Temp:  98.4 F (36.9 C) 97.7 F (36.5 C) 98.3 F (36.8 C)  TempSrc:  Oral Oral Oral  Resp:  20 18 18   Height:  6' (1.829 m)    Weight: 113 lb 8.6 oz (51.5 kg) 127 lb 10.3 oz (57.9 kg)    SpO2:  100% 100% 100%    Intake/Output Summary (Last 24 hours) at 06/11/15 1023 Last data filed at 06/10/15 1700  Gross per 24 hour  Intake    480 ml  Output      0 ml  Net    480 ml   Filed Weights   06/10/15 1400 06/10/15 1500  Weight: 113 lb 8.6 oz (51.5 kg) 127 lb 10.3 oz (57.9 kg)    PHYSICAL  EXAM  General: Pleasant, NAD. Neuro: Alert and oriented X 3. Moves all extremities spontaneously. Psych: Normal affect. HEENT:  Normal  Neck: Supple without bruits or JVD. Lungs:  Resp regular and unlabored, CTA. Heart: RRR no s3, s4, or murmurs. Abdomen: Soft, non-tender, non-distended, BS + x 4.  Extremities: No clubbing, cyanosis or edema. DP/PT/Radials 2+ and equal bilaterally.  Accessory Clinical Findings  CBC  Recent Labs  06/10/15 0913 06/11/15 0311  WBC 6.4 6.6  HGB 12.8* 11.9*  HCT 39.2 35.7*  MCV 93.6 91.3  PLT 261 257   Basic Metabolic Panel  Recent Labs  06/10/15 0749 06/10/15 0913 06/11/15 0311  NA 138  --  136  K 3.6  --  4.3  CL 100*  --  105  CO2  --   --  25  GLUCOSE 95  --  104*  BUN 3*  --  6  CREATININE 0.80 1.04 0.97  CALCIUM  --   --  9.0   Cardiac Enzymes  Recent Labs  06/10/15 0913 06/10/15 1424 06/10/15 2044  TROPONINI <0.03 0.03 <0.03   Fasting Lipid Panel  Recent Labs  06/11/15 0311  CHOL 226*  HDL  50  LDLCALC 161*  TRIG 74  CHOLHDL 4.5    TELE NSR with HR 80s    ECG  No new EKG  Echocardiogram  pending    Radiology/Studies  No results found.  ASSESSMENT AND PLAN  1. Chest pain  - given J point elevation in anterior lead, persistent chest pain and noncompliance with medication, code STEMI was called, he underwent urgent cath  - cath 06/10/2015 30% ost RPDA, 30% ost OM2, 75% ost D1, 10% ost LAD, patent stents in RCA  - pending echo, discharge today, I will discuss with case manager regarding obtain Brilinta as he run out of his meds a week ago and his compliance is not good   2. CAD s/p BMS to RCA 01/2007, PTCA to RCA 2015, DESx2 to RCA 01/2014, DES to RCA 12/2014  3. ICM with baseline EF 35-45%  4. Medication noncompliance with continuous tobacco abuse: will need cessation of tobacco and better compliance or his overall long term prognosis will be very poor given recurrent RCA NSTEMI/STEMI before age  78  5. HTN  6. HLD: LDL 161, Chol 226, HDL 50, Trig 74.    Signed, Azalee Course PA-C Pager: [phone]  Pateint seen and examined  I agree with findings as noted by [facility] Cath showed no signif narrowings.   ON exam:  Lungs CTA  Cardiac RRR  No murmur  Ext R wrist with miniaml swellng   WIll get echo to confirm LVEF   Currently on lisinopril   Not on b blocker  Will add toprol XL 25   Needs statin    Then OK for d/c with outpt f/u  Compliance has been an issue  Discussed this with pt.   He ran out of meds  1 wk ago    F/U as outpt.    Dietrich Pates

## 2015-06-11 NOTE — Progress Notes (Signed)
NCM spoke to [name] and states he receives his Brilinta with [employer] for free. [name] to contact [employer] to follow up on next delivery. [name] RN CCM Case Mgmt phone [phone]

## 2015-06-11 NOTE — Progress Notes (Signed)
[name] Full to be D/C'd Home per MD order. Discussed with the patient and all questions fully answered.    VVS, Skin clean, dry and intact without evidence of skin break down, no evidence of skin tears noted.  IV catheter discontinued intact. Site without signs and symptoms of complications. Dressing and pressure applied.  An After Visit Summary was printed and given to the patient.    [name], [name]  06/11/2015 7:18 PM

## 2015-06-11 NOTE — Progress Notes (Signed)
  Echocardiogram 2D Echocardiogram has been performed.  Janalyn HarderWest, Narcissus Detwiler R 06/11/2015, 2:32 PM

## 2015-06-11 NOTE — Discharge Instructions (Signed)
No lifting over 5 lbs for 1 week. No sexual activity for 1 week. Keep procedure site clean & dry. If you notice increased pain, swelling, bleeding or pus, call/return!  You may shower, but no soaking baths/hot tubs/pools for 1 week °

## 2015-06-11 NOTE — Discharge Summary (Signed)
Discharge Summary    Patient ID: [name],  MRN: [id number], DOB/AGE: [birth year] 40 y.o.  Admit date: 06/10/2015 Discharge date: 06/11/2015  Primary Care Provider: No PCP Per Patient Primary Cardiologist: Dr. Tresa Endo  Discharge Diagnoses    Principal Problem:   Chest pain Active Problems:   Hyperlipidemia with target LDL less than 70   Essential hypertension   CAD S/P BMS PCI to RCA with PTCA for ISR --> followed by stent thrombosis - DES PCI   Hx of medication noncompliance   Ischemic cardiomyopathy   Schizoaffective disorder, bipolar type (HCC)   Allergies Allergies  Allergen Reactions  . Iodine Shortness Of Breath and Swelling  . Shellfish Allergy Anaphylaxis    All shellfish  . Contrast Media [Iodinated Diagnostic Agents] Nausea And Vomiting    Diagnostic Studies/Procedures    Cath 06/10/2015 Conclusion     Ost RPDA to RPDA lesion, 30% stenosed.  Ost 2nd Mrg to 2nd Mrg lesion, 30% stenosed.  Ost 1st Mrg to 1st Mrg lesion, 25% stenosed.  Ost 1st Diag to 1st Diag lesion, 75% stenosed.  Ost LAD to Mid LAD lesion, 10% stenosed.  1. Single vessel obstructive CAD involving a small diagonal branch. Stents in RCA are widely patent. 2. Normal LV EDP  Plan: check Echo. Routine labs and troponins. Resume DAPT. Anticipate DC tomorrow if no complications.      Echo 06/11/2015  - Left ventricle: There is a dense rounded density within the  myocardium near the posterior mitral valve annulus of unknown  signifcance. Recommend MRI for further evaluation. The cavity  size was normal. Systolic function was normal. The estimated  ejection fraction was in the range of 50% to 55%. There is  akinesis of the entireinferolateral and inferior myocardium. The  pulmonary vein flow pattern was normal. Left ventricular  diastolic function parameters were normal. - Aortic valve: There was mild regurgitation. _____________   History of Present Illness       The patient is a 40 year old homeless male with past medical history of bipolar 1 disorder, schizoaffective disorder, hypertension, hyperlipidemia, history of medication noncompliance, history of polysubstance abuse, CAD s/p multiple PCI to RCA, and ischemic cardiomyopathy with baseline EF 35-45% by LV gram. He presents today as an anterior STEMI. He has a history of multiple PCI to RCA including bare metal stent to RCA in January 2009, PTCA in July 2015 for in-stent restenosis of RCA, DES 2 in January 2016. His last cardiac catheterization in December 2016 showed 100% mid to distal RCA in-stent restenosis treated with balloon angioplasty followed by Synergy DES, 40% proximal left circumflex, 40% mid left circumflex, 60% ostial D1, EF by LV gram 35-45%. Since then, he has been placed on aspirin and Brilinta.  He has failed to follow-up with cardiology group since intervention. He was admitted by psychiatry in February for suicidal ideation after claiming to want to suicide [place] railroad track. He ran out of Brilinta a week ago, for the past 3 days he started having substernal chest pain. He denies any fever, chill, or cough. Currently he is having 6 out of 10 chest pain. Initial EKG obtained by EMS showed mildly elevated J-point in anterior lead. Code STEMI was activated, patient was taken urgently to cardiac cath lab for diagnostic cardiac catheterization. Risk and benefit of this procedure has been explained, patient displayed clear understanding and agreed to proceed. He denies any recent bleeding issue including blood in stool or urine.  Hospital  Course     He underwent emergent cardiac catheterization on 06/10/2015 which showed 30% ost RPDA, 30% ost OM2, 75% ost D1, 10% ost LAD, patent stents in RCA. He was seen the morning of 06/11/2015 at which time he denies any chest discomfort or shortness of breath. Echocardiogram obtained on 06/11/2015 showed EF 50-55%, dense rounded density within the  myocardium near the posterior mitral valve annulus of unknown significance, recommend MRI for further evaluation. Mild AR. He has no sign of infection or endocarditis. I have discussed with Dr. Tenny Craw who recommended outpatient MRI.   Due to his homeless issue, I have consulted the case manager who has been assisting the patient with obtaining medication. Case manager has picked up 3 weeks of Brilinta sample from our cardiology office which i have given to him. Case manager also contacted pharmaceutical company to obtain more medication for him.  _____________  Discharge Vitals Blood pressure 130/97, pulse 68, temperature 98.4 F (36.9 C), temperature source Oral, resp. rate 18, height 6' (1.829 m), weight 127 lb 10.3 oz (57.9 kg), SpO2 100 %.  Filed Weights   06/10/15 1400 06/10/15 1500  Weight: 113 lb 8.6 oz (51.5 kg) 127 lb 10.3 oz (57.9 kg)    Labs & Radiologic Studies     CBC  Recent Labs  06/10/15 0913 06/11/15 0311  WBC 6.4 6.6  HGB 12.8* 11.9*  HCT 39.2 35.7*  MCV 93.6 91.3  PLT 261 257   Basic Metabolic Panel  Recent Labs  06/10/15 0749 06/10/15 0913 06/11/15 0311  NA 138  --  136  K 3.6  --  4.3  CL 100*  --  105  CO2  --   --  25  GLUCOSE 95  --  104*  BUN 3*  --  6  CREATININE 0.80 1.04 0.97  CALCIUM  --   --  9.0   Cardiac Enzymes  Recent Labs  06/10/15 0913 06/10/15 1424 06/10/15 2044  TROPONINI <0.03 0.03 <0.03   Fasting Lipid Panel  Recent Labs  06/11/15 0311  CHOL 226*  HDL 50  LDLCALC 161*  TRIG 74  CHOLHDL 4.5     Disposition   Pt is being discharged home today in good condition.  Follow-up Plans & Appointments    Follow-up Information    Follow up with Nicolasa Ducking, NP On 07/02/2015.   Specialties:  Nurse Practitioner, Cardiology, Radiology   Why:  1:30AM. Cardiology followup.   Contact information:   [address] [place] [address] [phone]       Follow up with [facility].   Specialty:   Behavioral Health   Why:  follow appt as needed   Contact information:   [address] [place] [address] [phone]       Follow up with [facility].   Specialty:  Internal Medicine   Why:  appointment 07/07/2015 at 11 am. please call to reschedule this appointment if you are unable to make it.    Contact information:   [address] [place] [address] [phone]       Discharge Medications   Current Discharge Medication List    START taking these medications   Details  metoprolol succinate (TOPROL-XL) 25 MG 24 hr tablet Take 1 tablet (25 mg total) by mouth daily. Qty: 90 tablet, Refills: 3    nitroGLYCERIN (NITROSTAT) 0.4 MG SL tablet Place 1 tablet (0.4 mg total) under the tongue every 5 (  five) minutes x 3 doses as needed for chest pain. Qty: 25 tablet, Refills: 3      CONTINUE these medications which have CHANGED   Details  lisinopril (PRINIVIL,ZESTRIL) 5 MG tablet Take 1 tablet (5 mg total) by mouth daily. Qty: 90 tablet, Refills: 3    pravastatin (PRAVACHOL) 40 MG tablet Take 1 tablet (40 mg total) by mouth daily. Qty: 90 tablet, Refills: 3    ticagrelor (BRILINTA) 90 MG TABS tablet Take 1 tablet (90 mg total) by mouth 2 (two) times daily. Qty: 60 tablet, Refills: 11      CONTINUE these medications which have NOT CHANGED   Details  ARIPiprazole (ABILIFY) 5 MG tablet Take one tab (5 mg) in the morning.  Then tale 1 tab (5 mg) in the evening. Qty: 60 tablet, Refills: 0    ARIPiprazole 400 MG SUSR Inject 400 mg into the muscle every 28 (twenty-eight) days. Last dose given 03/11/2015.  Next dose due 04/08/15 Qty: 1 each, Refills: 0    aspirin EC 81 MG EC tablet Take 1 tablet (81 mg total) by mouth daily. Qty: 30 tablet, Refills: 0    benztropine (COGENTIN) 0.5 MG tablet Take one tab (0.5 mg) with breakfast.  Then take another tab (0.5 mg) in the evening. Qty: 60 tablet, Refills: 0    nicotine (NICODERM CQ - DOSED IN  MG/24 HOURS) 21 mg/24hr patch Place 1 patch (21 mg total) onto the skin daily. Qty: 28 patch, Refills: 0    traZODone (DESYREL) 100 MG tablet Take 1 tablet (100 mg total) by mouth at bedtime as needed for sleep. Qty: 30 tablet, Refills: 0         Outstanding Labs/Studies   Cardiac MRI  Duration of Discharge Encounter   Greater than 30 minutes including physician time.  Signed, Azalee CourseHao Meng PA-C 06/11/2015, 5:04 PM    Patient seen and examined  I agree with plans as noted above by H Meng Pt with be scheduled for cardiac MRI to evaluate posterior basal region of heart Continue medical Rx for CAD>   Dietrich PatesPaula Carinna Newhart

## 2015-06-12 ENCOUNTER — Telehealth: Payer: Self-pay | Admitting: Internal Medicine

## 2015-06-12 ENCOUNTER — Encounter: Payer: Self-pay | Admitting: Internal Medicine

## 2015-06-12 LAB — HEMOGLOBIN A1C
Hgb A1c MFr Bld: 5.3 % (ref 4.8–5.6)
Mean Plasma Glucose: 105 mg/dL

## 2015-06-12 NOTE — Telephone Encounter (Signed)
Called the patient and gave him the date, time and location of his cardiac MRI.  Letter mailed to the patient today.

## 2015-06-12 NOTE — Care Management (Addendum)
NCM spoke to pt and pt will follow up with [employer] to complete application and [facility] requested to patient assistance program. Isidoro DonningAlesia Danen Lapaglia RN CCM Case Mgmt phone [phone]

## 2015-06-25 ENCOUNTER — Encounter: Payer: Self-pay | Admitting: *Deleted

## 2015-06-26 ENCOUNTER — Ambulatory Visit (HOSPITAL_COMMUNITY)
Admission: RE | Admit: 2015-06-26 | Discharge: 2015-06-26 | Disposition: A | Discharge status: Home / Self Care | Payer: Self-pay | Source: Ambulatory Visit | Attending: Physician Assistant | Admitting: Physician Assistant

## 2015-06-26 DIAGNOSIS — R931 Abnormal findings on diagnostic imaging of heart and coronary circulation: Secondary | ICD-10-CM | POA: Insufficient documentation

## 2015-06-26 DIAGNOSIS — L905 Scar conditions and fibrosis of skin: Secondary | ICD-10-CM | POA: Insufficient documentation

## 2015-06-26 MED ORDER — GADOBENATE DIMEGLUMINE 529 MG/ML IV SOLN
20.0000 mL | Freq: Once | INTRAVENOUS | Status: AC | PRN
  Administered 2015-06-26: 20 mL via INTRAVENOUS

## 2015-07-02 ENCOUNTER — Encounter: Payer: Self-pay | Admitting: Nurse Practitioner

## 2015-07-02 ENCOUNTER — Ambulatory Visit: Payer: Self-pay | Admitting: Nurse Practitioner

## 2015-07-02 ENCOUNTER — Encounter: Payer: Self-pay | Admitting: *Deleted

## 2015-07-02 DIAGNOSIS — E785 Hyperlipidemia, unspecified: Secondary | ICD-10-CM | POA: Insufficient documentation

## 2015-07-07 ENCOUNTER — Ambulatory Visit (INDEPENDENT_AMBULATORY_CARE_PROVIDER_SITE_OTHER): Payer: Self-pay | Admitting: Family Medicine

## 2015-07-07 ENCOUNTER — Encounter: Payer: Self-pay | Admitting: Family Medicine

## 2015-07-07 VITALS — BP 132/84 | HR 61 | Temp 98.1°F | Resp 14 | Ht 71.0 in | Wt 133.0 lb

## 2015-07-07 DIAGNOSIS — I10 Essential (primary) hypertension: Secondary | ICD-10-CM

## 2015-07-07 DIAGNOSIS — F250 Schizoaffective disorder, bipolar type: Secondary | ICD-10-CM

## 2015-07-07 DIAGNOSIS — Z23 Encounter for immunization: Secondary | ICD-10-CM

## 2015-07-07 DIAGNOSIS — I255 Ischemic cardiomyopathy: Secondary | ICD-10-CM

## 2015-07-07 DIAGNOSIS — Z720 Tobacco use: Secondary | ICD-10-CM

## 2015-07-07 DIAGNOSIS — E785 Hyperlipidemia, unspecified: Secondary | ICD-10-CM

## 2015-07-07 LAB — POCT URINALYSIS DIP (DEVICE)
Bilirubin Urine: NEGATIVE
Glucose, UA: NEGATIVE mg/dL
KETONES UR: NEGATIVE mg/dL
Leukocytes, UA: NEGATIVE
Nitrite: NEGATIVE
PROTEIN: NEGATIVE mg/dL
SPECIFIC GRAVITY, URINE: 1.025 (ref 1.005–1.030)
UROBILINOGEN UA: 0.2 mg/dL (ref 0.0–1.0)
pH: 5 (ref 5.0–8.0)

## 2015-07-07 MED ORDER — PRAVASTATIN SODIUM 40 MG PO TABS: 40.0000 mg | ORAL_TABLET | Freq: Every day | ORAL | Status: DC

## 2015-07-07 MED ORDER — TICAGRELOR 90 MG PO TABS: 90.0000 mg | ORAL_TABLET | Freq: Two times a day (BID) | ORAL | Status: DC

## 2015-07-07 MED ORDER — METOPROLOL SUCCINATE ER 25 MG PO TB24: 25.0000 mg | ORAL_TABLET | Freq: Every day | ORAL | Status: DC

## 2015-07-07 MED ORDER — LISINOPRIL 5 MG PO TABS: 5.0000 mg | ORAL_TABLET | Freq: Every day | ORAL | Status: DC

## 2015-07-07 NOTE — Progress Notes (Signed)
Subjective:    Patient ID: [name], male    DOB: 17-Jan-1975, 40 y.o.   MRN: [id number]  HPI Me. [name], a 40 year old male with a history of hypertension, hyperlipidemia, and ischemic cardiomyopathy presents to establish care. [name] says that he has not had a primary care provider, he has primarily been using the emergency department for all primary needs. He was admitted to inpatient services on 06/10/2015 with chest pain with an elevated J point in the anterior lead. as an anterior STEMI. He had a bare metal stent placed in 2009. He also had a stent placed in January 2016. Previous cardiac catheterization was in December 2016. He has been managed on aspirin and Brilinta. He was previously followed by cardiology, but has been lost to follow up since intervention. He had an appointment scheduled with cardiology on 07/02/2015, he says that he missed appointment due to being stuck out of town. He is not exercising and is not adherent to low salt diet.  He does not check blood pressures at home. He has been taking medications consistently since hospital discharge. Patient denies chest pain, dyspnea, fatigue, lower extremity edema, orthopnea, palpitations, syncope and tachypnea.  Cardiovascular risk factors: dyslipidemia, hypertension, male gender and smoking/ tobacco exposure.   [name] also has a history of bipolar disorder and schizophrenia. [facility]. He says that he has been taking medications. He denies visual or auditory hallucinations. He also denies suicidal or homicidal intent.   Past Medical History  Diagnosis Date  . Bipolar 1 disorder (HCC)   . Hypertensive heart disease   . Hyperlipidemia   . Hx of medication noncompliance   . Polysubstance abuse     a. h/o tobacco, marijuana and crack cocaine use. b. 07/2013: +UDS THC, neg for cocaine.  . Anxiety   . CAD S/P percutaneous coronary angioplasty     a. Inf STEMI s/p BMS to RCA  01/2007. b. NSTEMI 07/2013 s/p PTCA to RCA for ISR; c. Transient inferior ST elevation (peak Ti 0.25) 01/2014 s/p PTCA/DES to pRCA, PTCA/DES to dRCA, EF 50%; d. 12/2014 Inf STEMI: RCA patent prox stent, [id number]/d (3.0x32 Synergy DES), EF 35-45; e. 05/2015 Inf STEMI/Cath: LM nl, LAD 10ost/m, D1 75, LCX nl, OM1 25, OM2 30, RCA patent stents, RPDA 30ost.  . NSVT (nonsustained ventricular tachycardia) (HCC)     a. Very brief run during 07/2013 admit for NSTEMI felt due to MI.  . Ischemic cardiomyopathy     a. EF 40% in 2011, 60% in 2012. b. EF 55% by cath 07/2013. c. EF 50% by cath 01/2014; d. 12/2014 EF 35-45% by LV gram; e. 05/2015 Echo: EF 50-55% inflat/inf AK, mild AI; f. 06/2015 cMRI: EF 49%, basal & mid inf full thickness scar, subendocardial scar in antsept and antlat wall, correlating w/ D1 dzs.  . Tobacco abuse     Immunization History  Administered Date(s) Administered  . Influenza,inj,Quad PF,36+ Mos 01/15/2014, 01/11/2015   Review of Systems  Constitutional: Negative.   HENT: Negative.   Eyes: Negative.   Respiratory: Negative.  Negative for cough, chest tightness and shortness of breath.   Cardiovascular: Negative.  Negative for chest pain, palpitations and leg swelling.  Gastrointestinal: Negative.   Endocrine: Negative.  Negative for polydipsia, polyphagia and polyuria.  Genitourinary: Negative.   Musculoskeletal: Negative.   Skin: Negative.   Allergic/Immunologic: Negative.   Neurological: Negative.  Negative for dizziness.  Hematological: Negative.   Psychiatric/Behavioral: Negative.  Negative for suicidal ideas, behavioral problems, sleep disturbance, decreased concentration and agitation.       Objective:   Physical Exam  Constitutional: He is oriented to person, place, and time. He appears well-developed and well-nourished.  HENT:  Head: Normocephalic and atraumatic.  Right Ear: External ear normal.  Left Ear: External ear normal.  Nose: Nose normal.  Mouth/Throat:  Oropharynx is clear and moist.  Eyes: Conjunctivae and EOM are normal. Pupils are equal, round, and reactive to light.  Neck: Normal range of motion. Neck supple.  Cardiovascular: Normal rate, normal heart sounds and intact distal pulses.   Pulmonary/Chest: Effort normal and breath sounds normal.  Abdominal: Soft. Bowel sounds are normal.  Musculoskeletal: Normal range of motion.  Neurological: He is alert and oriented to person, place, and time. He has normal reflexes.  Skin: Skin is warm and dry.  Psychiatric: He has a normal mood and affect. His behavior is normal. Judgment and thought content normal.      BP 132/84 mmHg  Pulse 61  Temp(Src) 98.1 F (36.7 C) (Oral)  Resp 14  Ht 5\' 11"  (1.803 m)  Wt 133 lb (60.328 kg)  BMI 18.56 kg/m2  SpO2 100% Assessment & Plan:   1. Essential hypertension Blood pressure is at goal on current medication regimen. Will continue as prescribed.  - lisinopril (PRINIVIL,ZESTRIL) 5 MG tablet; Take 1 tablet (5 mg total) by mouth daily.  Dispense: 90 tablet; Refill: 1 - metoprolol succinate (TOPROL-XL) 25 MG 24 hr tablet; Take 1 tablet (25 mg total) by mouth daily.  Dispense: 90 tablet; Refill: 1 - Urinalysis Dipstick - POCT urinalysis dip (device)  2. Ischemic cardiomyopathy Reviewed previous cardiovascular notes and echocardiogram. Advised patient to re-schedule follow up with cardiology.  - ticagrelor (BRILINTA) 90 MG TABS tablet; Take 1 tablet (90 mg total) by mouth 2 (two) times daily.  Dispense: 60 tablet; Refill: 11  3. Hyperlipidemia The patient is asked to make an attempt to improve diet and exercise patterns to aid in medical management of this problem. - pravastatin (PRAVACHOL) 40 MG tablet; Take 1 tablet (40 mg total) by mouth daily.  Dispense: 90 tablet; Refill: 1  4. Schizoaffective disorder, bipolar type (HCC) [facility]. Continue medications as previously prescribed.   5. Immunization  due  - Pneumococcal polysaccharide vaccine 23-valent greater than or equal to 2yo subcutaneous/IM  6. Tobacco abuse Smoking cessation instruction/counseling given:  counseled patient on the dangers of tobacco use, advised patient to stop smoking, and reviewed strategies to maximize success      RTC: 6 months for hypertension   Massie MaroonHollis,Eric Nees M, FNP

## 2015-07-07 NOTE — Patient Instructions (Signed)
DASH Eating Plan  DASH stands for "Dietary Approaches to Stop Hypertension." The DASH eating plan is a healthy eating plan that has been shown to reduce high blood pressure (hypertension). Additional health benefits may include reducing the risk of type 2 diabetes mellitus, heart disease, and stroke. The DASH eating plan may also help with weight loss.  WHAT DO I NEED TO KNOW ABOUT THE DASH EATING PLAN?  For the DASH eating plan, you will follow these general guidelines:  · Choose foods with a percent daily value for sodium of less than 5% (as listed on the food label).  · Use salt-free seasonings or herbs instead of table salt or sea salt.  · Check with your health care provider or pharmacist before using salt substitutes.  · Eat lower-sodium products, often labeled as "lower sodium" or "no salt added."  · Eat fresh foods.  · Eat more vegetables, fruits, and low-fat dairy products.  · Choose whole grains. Look for the word "whole" as the first word in the ingredient list.  · Choose fish and skinless chicken or turkey more often than red meat. Limit fish, poultry, and meat to 6 oz (170 g) each day.  · Limit sweets, desserts, sugars, and sugary drinks.  · Choose heart-healthy fats.  · Limit cheese to 1 oz (28 g) per day.  · Eat more home-cooked food and less restaurant, buffet, and fast food.  · Limit fried foods.  · Cook foods using methods other than frying.  · Limit canned vegetables. If you do use them, rinse them well to decrease the sodium.  · When eating at a restaurant, ask that your food be prepared with less salt, or no salt if possible.  WHAT FOODS CAN I EAT?  Seek help from a dietitian for individual calorie needs.  Grains  Whole grain or whole wheat bread. Brown rice. Whole grain or whole wheat pasta. Quinoa, bulgur, and whole grain cereals. Low-sodium cereals. Corn or whole wheat flour tortillas. Whole grain cornbread. Whole grain crackers. Low-sodium crackers.  Vegetables  Fresh or frozen vegetables  (raw, steamed, roasted, or grilled). Low-sodium or reduced-sodium tomato and vegetable juices. Low-sodium or reduced-sodium tomato sauce and paste. Low-sodium or reduced-sodium canned vegetables.   Fruits  All fresh, canned (in natural juice), or frozen fruits.  Meat and Other Protein Products  Ground beef (85% or leaner), grass-fed beef, or beef trimmed of fat. Skinless chicken or turkey. Ground chicken or turkey. Pork trimmed of fat. All fish and seafood. Eggs. Dried beans, peas, or lentils. Unsalted nuts and seeds. Unsalted canned beans.  Dairy  Low-fat dairy products, such as skim or 1% milk, 2% or reduced-fat cheeses, low-fat ricotta or cottage cheese, or plain low-fat yogurt. Low-sodium or reduced-sodium cheeses.  Fats and Oils  Tub margarines without trans fats. Light or reduced-fat mayonnaise and salad dressings (reduced sodium). Avocado. Safflower, olive, or canola oils. Natural peanut or almond butter.  Other  Unsalted popcorn and pretzels.  The items listed above may not be a complete list of recommended foods or beverages. Contact your dietitian for more options.  WHAT FOODS ARE NOT RECOMMENDED?  Grains  White bread. White pasta. White rice. Refined cornbread. Bagels and croissants. Crackers that contain trans fat.  Vegetables  Creamed or fried vegetables. Vegetables in a cheese sauce. Regular canned vegetables. Regular canned tomato sauce and paste. Regular tomato and vegetable juices.  Fruits  Dried fruits. Canned fruit in light or heavy syrup. Fruit juice.  Meat and Other Protein   Products  Fatty cuts of meat. Ribs, chicken wings, bacon, sausage, bologna, salami, chitterlings, fatback, hot dogs, bratwurst, and packaged luncheon meats. Salted nuts and seeds. Canned beans with salt.  Dairy  Whole or 2% milk, cream, half-and-half, and cream cheese. Whole-fat or sweetened yogurt. Full-fat cheeses or blue cheese. Nondairy creamers and whipped toppings. Processed cheese, cheese spreads, or cheese  curds.  Condiments  Onion and garlic salt, seasoned salt, table salt, and sea salt. Canned and packaged gravies. Worcestershire sauce. Tartar sauce. Barbecue sauce. Teriyaki sauce. Soy sauce, including reduced sodium. Steak sauce. Fish sauce. Oyster sauce. Cocktail sauce. Horseradish. Ketchup and mustard. Meat flavorings and tenderizers. Bouillon cubes. Hot sauce. Tabasco sauce. Marinades. Taco seasonings. Relishes.  Fats and Oils  Butter, stick margarine, lard, shortening, ghee, and bacon fat. Coconut, palm kernel, or palm oils. Regular salad dressings.  Other  Pickles and olives. Salted popcorn and pretzels.  The items listed above may not be a complete list of foods and beverages to avoid. Contact your dietitian for more information.  WHERE CAN I FIND MORE INFORMATION?  National Heart, [employer], [employer] Institute: www.nhlbi.nih.gov/health/health-topics/topics/dash/     This information is not intended to replace advice given to you by your health care provider. Make sure you discuss any questions you have with your health care provider.     Document Released: 12/17/2010 Document Revised: 01/18/2014 Document Reviewed: 11/01/2012  Elsevier Interactive Patient Education ©2016 Elsevier Inc.

## 2015-07-14 ENCOUNTER — Ambulatory Visit: Payer: Self-pay | Admitting: Physician Assistant

## 2015-09-27 ENCOUNTER — Emergency Department (HOSPITAL_COMMUNITY): Payer: Medicaid Other

## 2015-09-27 ENCOUNTER — Inpatient Hospital Stay (HOSPITAL_COMMUNITY): Payer: Medicaid Other

## 2015-09-27 ENCOUNTER — Inpatient Hospital Stay (HOSPITAL_COMMUNITY)
Admission: EM | Admit: 2015-09-27 | Discharge: 2015-09-30 | DRG: 251 | Disposition: A | Discharge status: Home / Self Care | Payer: Medicaid Other | Attending: Internal Medicine | Admitting: Internal Medicine

## 2015-09-27 ENCOUNTER — Encounter (HOSPITAL_COMMUNITY)
Admission: EM | Disposition: A | Discharge status: Home / Self Care | Payer: Self-pay | Source: Home / Self Care | Attending: Internal Medicine

## 2015-09-27 ENCOUNTER — Encounter (HOSPITAL_COMMUNITY): Payer: Self-pay | Admitting: Emergency Medicine

## 2015-09-27 DIAGNOSIS — Y838 Other surgical procedures as the cause of abnormal reaction of the patient, or of later complication, without mention of misadventure at the time of the procedure: Secondary | ICD-10-CM | POA: Diagnosis present

## 2015-09-27 DIAGNOSIS — E440 Moderate protein-calorie malnutrition: Secondary | ICD-10-CM | POA: Diagnosis present

## 2015-09-27 DIAGNOSIS — Z9114 Patient's other noncompliance with medication regimen: Secondary | ICD-10-CM

## 2015-09-27 DIAGNOSIS — F419 Anxiety disorder, unspecified: Secondary | ICD-10-CM | POA: Diagnosis present

## 2015-09-27 DIAGNOSIS — Z91041 Radiographic dye allergy status: Secondary | ICD-10-CM | POA: Diagnosis not present

## 2015-09-27 DIAGNOSIS — I252 Old myocardial infarction: Secondary | ICD-10-CM

## 2015-09-27 DIAGNOSIS — I255 Ischemic cardiomyopathy: Secondary | ICD-10-CM | POA: Diagnosis present

## 2015-09-27 DIAGNOSIS — Z8249 Family history of ischemic heart disease and other diseases of the circulatory system: Secondary | ICD-10-CM | POA: Diagnosis not present

## 2015-09-27 DIAGNOSIS — I2111 ST elevation (STEMI) myocardial infarction involving right coronary artery: Secondary | ICD-10-CM | POA: Diagnosis not present

## 2015-09-27 DIAGNOSIS — F319 Bipolar disorder, unspecified: Secondary | ICD-10-CM | POA: Diagnosis present

## 2015-09-27 DIAGNOSIS — Z91013 Allergy to seafood: Secondary | ICD-10-CM

## 2015-09-27 DIAGNOSIS — F17210 Nicotine dependence, cigarettes, uncomplicated: Secondary | ICD-10-CM | POA: Diagnosis present

## 2015-09-27 DIAGNOSIS — Z818 Family history of other mental and behavioral disorders: Secondary | ICD-10-CM | POA: Diagnosis not present

## 2015-09-27 DIAGNOSIS — Z7982 Long term (current) use of aspirin: Secondary | ICD-10-CM

## 2015-09-27 DIAGNOSIS — F259 Schizoaffective disorder, unspecified: Secondary | ICD-10-CM | POA: Diagnosis not present

## 2015-09-27 DIAGNOSIS — Z91199 Patient's noncompliance with other medical treatment and regimen due to unspecified reason: Secondary | ICD-10-CM

## 2015-09-27 DIAGNOSIS — E785 Hyperlipidemia, unspecified: Secondary | ICD-10-CM | POA: Diagnosis not present

## 2015-09-27 DIAGNOSIS — Z9119 Patient's noncompliance with other medical treatment and regimen: Secondary | ICD-10-CM

## 2015-09-27 DIAGNOSIS — I2510 Atherosclerotic heart disease of native coronary artery without angina pectoris: Secondary | ICD-10-CM | POA: Diagnosis not present

## 2015-09-27 DIAGNOSIS — R079 Chest pain, unspecified: Secondary | ICD-10-CM

## 2015-09-27 DIAGNOSIS — Z79899 Other long term (current) drug therapy: Secondary | ICD-10-CM | POA: Diagnosis not present

## 2015-09-27 DIAGNOSIS — Z681 Body mass index (BMI) 19 or less, adult: Secondary | ICD-10-CM | POA: Diagnosis not present

## 2015-09-27 DIAGNOSIS — I119 Hypertensive heart disease without heart failure: Secondary | ICD-10-CM | POA: Diagnosis not present

## 2015-09-27 DIAGNOSIS — Z590 Homelessness: Secondary | ICD-10-CM | POA: Diagnosis not present

## 2015-09-27 DIAGNOSIS — Z7902 Long term (current) use of antithrombotics/antiplatelets: Secondary | ICD-10-CM

## 2015-09-27 DIAGNOSIS — Z91048 Other nonmedicinal substance allergy status: Secondary | ICD-10-CM

## 2015-09-27 DIAGNOSIS — Z720 Tobacco use: Secondary | ICD-10-CM | POA: Diagnosis present

## 2015-09-27 DIAGNOSIS — N289 Disorder of kidney and ureter, unspecified: Secondary | ICD-10-CM | POA: Diagnosis not present

## 2015-09-27 DIAGNOSIS — E876 Hypokalemia: Secondary | ICD-10-CM | POA: Diagnosis present

## 2015-09-27 HISTORY — PX: CARDIAC CATHETERIZATION: SHX172

## 2015-09-27 LAB — BASIC METABOLIC PANEL
ANION GAP: 11 (ref 5–15)
Anion gap: 18 — ABNORMAL HIGH (ref 5–15)
BUN: 8 mg/dL (ref 6–20)
BUN: 7 mg/dL (ref 6–20)
CALCIUM: 9.3 mg/dL (ref 8.9–10.3)
CHLORIDE: 95 mmol/L — AB (ref 101–111)
CO2: 23 mmol/L (ref 22–32)
CO2: 29 mmol/L (ref 22–32)
CREATININE: 1.45 mg/dL — AB (ref 0.61–1.24)
Calcium: 8.8 mg/dL — ABNORMAL LOW (ref 8.9–10.3)
Chloride: 93 mmol/L — ABNORMAL LOW (ref 101–111)
Creatinine, Ser: 1.35 mg/dL — ABNORMAL HIGH (ref 0.61–1.24)
GFR calc Af Amer: >60 mL/min (ref >60)
GFR calc non Af Amer: >60 mL/min (ref >60)
GFR, EST NON AFRICAN AMERICAN: 60 mL/min — AB (ref >60)
Glucose, Bld: 134 mg/dL — ABNORMAL HIGH (ref 65–99)
Glucose, Bld: 128 mg/dL — ABNORMAL HIGH (ref 65–99)
POTASSIUM: 2.7 mmol/L — AB (ref 3.5–5.1)
POTASSIUM: 2.7 mmol/L — AB (ref 3.5–5.1)
SODIUM: 134 mmol/L — AB (ref 135–145)
Sodium: 135 mmol/L (ref 135–145)

## 2015-09-27 LAB — CBC WITH DIFFERENTIAL/PLATELET
Basophils Absolute: 0 10*3/uL (ref 0.0–0.1)
Basophils Relative: 0 %
Eosinophils Absolute: 0.1 10*3/uL (ref 0.0–0.7)
Eosinophils Relative: 1 %
HEMATOCRIT: 49.8 % (ref 39.0–52.0)
HEMOGLOBIN: 17.1 g/dL — AB (ref 13.0–17.0)
LYMPHS ABS: 2.8 10*3/uL (ref 0.7–4.0)
LYMPHS PCT: 38 %
MCH: 31.7 pg (ref 26.0–34.0)
MCHC: 34.3 g/dL (ref 30.0–36.0)
MCV: 92.2 fL (ref 78.0–100.0)
MONO ABS: 0.2 10*3/uL (ref 0.1–1.0)
MONOS PCT: 3 %
NEUTROS ABS: 4.1 10*3/uL (ref 1.7–7.7)
Neutrophils Relative %: 58 %
Platelets: 194 10*3/uL (ref 150–400)
RBC: 5.4 MIL/uL (ref 4.22–5.81)
RDW: 13.6 % (ref 11.5–15.5)
WBC: 7.2 10*3/uL (ref 4.0–10.5)

## 2015-09-27 LAB — MRSA PCR SCREENING: MRSA BY PCR: NEGATIVE

## 2015-09-27 LAB — ECHOCARDIOGRAM COMPLETE
HEIGHTINCHES: 72 in
WEIGHTICAEL: 2016 [oz_av]

## 2015-09-27 LAB — RAPID URINE DRUG SCREEN, HOSP PERFORMED
Amphetamines: POSITIVE — AB
Barbiturates: NOT DETECTED
Benzodiazepines: POSITIVE — AB
COCAINE: NOT DETECTED
OPIATES: NOT DETECTED
Tetrahydrocannabinol: POSITIVE — AB

## 2015-09-27 LAB — CBC
HEMATOCRIT: 47.4 % (ref 39.0–52.0)
HEMOGLOBIN: 16.5 g/dL (ref 13.0–17.0)
MCH: 32 pg (ref 26.0–34.0)
MCHC: 34.8 g/dL (ref 30.0–36.0)
MCV: 91.9 fL (ref 78.0–100.0)
Platelets: 224 10*3/uL (ref 150–400)
RBC: 5.16 MIL/uL (ref 4.22–5.81)
RDW: 13.4 % (ref 11.5–15.5)
WBC: 8.9 10*3/uL (ref 4.0–10.5)

## 2015-09-27 LAB — TROPONIN I: TROPONIN I: 0.03 ng/mL — AB (ref <0.03)

## 2015-09-27 LAB — MAGNESIUM: MAGNESIUM: 2 mg/dL (ref 1.7–2.4)

## 2015-09-27 LAB — PLATELET COUNT: PLATELETS: 221 10*3/uL (ref 150–400)

## 2015-09-27 SURGERY — LEFT HEART CATH AND CORONARY ANGIOGRAPHY
Anesthesia: LOCAL

## 2015-09-27 MED ORDER — FENTANYL CITRATE (PF) 100 MCG/2ML IJ SOLN
INTRAMUSCULAR | Status: DC | PRN
Start: 2015-09-27 — End: 2015-09-27
  Administered 2015-09-27: 25 ug via INTRAVENOUS

## 2015-09-27 MED ORDER — TIROFIBAN (AGGRASTAT) BOLUS VIA INFUSION
INTRAVENOUS | Status: DC | PRN
  Administered 2015-09-27: 1430 ug via INTRAVENOUS

## 2015-09-27 MED ORDER — PRAVASTATIN SODIUM 40 MG PO TABS: 40.0000 mg | ORAL_TABLET | Freq: Every day | ORAL | Status: DC

## 2015-09-27 MED ORDER — NITROGLYCERIN 1 MG/10 ML FOR IR/CATH LAB
INTRA_ARTERIAL | Status: DC | PRN
  Administered 2015-09-27: 200 ug via INTRACORONARY

## 2015-09-27 MED ORDER — FAMOTIDINE IN NACL 20-0.9 MG/50ML-% IV SOLN
INTRAVENOUS | Status: DC | PRN
  Administered 2015-09-27: 20 mg via INTRAVENOUS

## 2015-09-27 MED ORDER — TICAGRELOR 90 MG PO TABS
ORAL_TABLET | ORAL | Status: DC | PRN
  Administered 2015-09-27: 180 mg via ORAL

## 2015-09-27 MED ORDER — IOPAMIDOL (ISOVUE-370) INJECTION 76%
INTRAVENOUS | Status: DC | PRN
  Administered 2015-09-27: 35 mL via INTRA_ARTERIAL

## 2015-09-27 MED ORDER — MIDAZOLAM HCL 2 MG/2ML IJ SOLN
INTRAMUSCULAR | Status: DC | PRN
  Administered 2015-09-27: 2 mg via INTRAVENOUS

## 2015-09-27 MED ORDER — ASPIRIN 81 MG PO CHEW
81.0000 mg | CHEWABLE_TABLET | Freq: Every day | ORAL | Status: DC
  Administered 2015-09-27 – 2015-09-30 (×4): 81 mg via ORAL
  Filled 2015-09-27 (×4): qty 1

## 2015-09-27 MED ORDER — TICAGRELOR 90 MG PO TABS
ORAL_TABLET | ORAL | Status: AC
  Filled 2015-09-27: qty 2

## 2015-09-27 MED ORDER — FAMOTIDINE IN NACL 20-0.9 MG/50ML-% IV SOLN
INTRAVENOUS | Status: AC
  Filled 2015-09-27: qty 50

## 2015-09-27 MED ORDER — ACETAMINOPHEN 325 MG PO TABS: 650.0000 mg | ORAL_TABLET | ORAL | Status: DC | PRN

## 2015-09-27 MED ORDER — NITROGLYCERIN 0.4 MG SL SUBL: 0.4000 mg | SUBLINGUAL_TABLET | SUBLINGUAL | Status: DC | PRN

## 2015-09-27 MED ORDER — TICAGRELOR 90 MG PO TABS
90.0000 mg | ORAL_TABLET | Freq: Two times a day (BID) | ORAL | Status: DC
  Administered 2015-09-27 – 2015-09-30 (×7): 90 mg via ORAL
  Filled 2015-09-27 (×7): qty 1

## 2015-09-27 MED ORDER — CAPTOPRIL 6.25 MG HALF TABLET: 6.2500 mg | ORAL_TABLET | Freq: Three times a day (TID) | ORAL | Status: DC

## 2015-09-27 MED ORDER — IOPAMIDOL (ISOVUE-370) INJECTION 76%
INTRAVENOUS | Status: AC
  Filled 2015-09-27: qty 125

## 2015-09-27 MED ORDER — SODIUM CHLORIDE 0.9 % IV SOLN: 250.0000 mL | INTRAVENOUS | Status: DC | PRN

## 2015-09-27 MED ORDER — PRAVASTATIN SODIUM 40 MG PO TABS
40.0000 mg | ORAL_TABLET | Freq: Every day | ORAL | Status: DC
  Administered 2015-09-27 – 2015-09-28 (×2): 40 mg via ORAL
  Filled 2015-09-27 (×2): qty 1

## 2015-09-27 MED ORDER — ASPIRIN 81 MG PO CHEW: 324.0000 mg | CHEWABLE_TABLET | ORAL | Status: AC

## 2015-09-27 MED ORDER — METOPROLOL SUCCINATE ER 25 MG PO TB24
25.0000 mg | ORAL_TABLET | Freq: Every day | ORAL | Status: DC
  Administered 2015-09-27 – 2015-09-30 (×3): 25 mg via ORAL
  Filled 2015-09-27 (×4): qty 1

## 2015-09-27 MED ORDER — VERAPAMIL HCL 2.5 MG/ML IV SOLN
INTRAVENOUS | Status: AC
  Filled 2015-09-27: qty 2

## 2015-09-27 MED ORDER — TIROFIBAN HCL IN NACL 5-0.9 MG/100ML-% IV SOLN
0.1500 ug/kg/min | INTRAVENOUS | Status: AC
  Administered 2015-09-27: 0.15 ug/kg/min via INTRAVENOUS

## 2015-09-27 MED ORDER — ARIPIPRAZOLE 5 MG PO TABS: 5.0000 mg | ORAL_TABLET | Freq: Every day | ORAL | Status: DC

## 2015-09-27 MED ORDER — HEPARIN (PORCINE) IN NACL 2-0.9 UNIT/ML-% IJ SOLN
INTRAMUSCULAR | Status: DC | PRN
  Administered 2015-09-27: 1000 mL

## 2015-09-27 MED ORDER — LISINOPRIL 5 MG PO TABS
5.0000 mg | ORAL_TABLET | Freq: Every day | ORAL | Status: DC
  Administered 2015-09-27 – 2015-09-30 (×4): 5 mg via ORAL
  Filled 2015-09-27 (×4): qty 1

## 2015-09-27 MED ORDER — ARIPIPRAZOLE 5 MG PO TABS
5.0000 mg | ORAL_TABLET | Freq: Two times a day (BID) | ORAL | Status: DC
  Administered 2015-09-27 – 2015-09-30 (×7): 5 mg via ORAL
  Filled 2015-09-27 (×7): qty 1

## 2015-09-27 MED ORDER — FENTANYL CITRATE (PF) 100 MCG/2ML IJ SOLN
INTRAMUSCULAR | Status: AC
  Filled 2015-09-27: qty 2

## 2015-09-27 MED ORDER — BIVALIRUDIN BOLUS VIA INFUSION - CUPID
INTRAVENOUS | Status: DC | PRN
  Administered 2015-09-27: 42.9 mg via INTRAVENOUS

## 2015-09-27 MED ORDER — TRAZODONE HCL 100 MG PO TABS
100.0000 mg | ORAL_TABLET | Freq: Every evening | ORAL | Status: DC | PRN
  Administered 2015-09-27 – 2015-09-28 (×2): 100 mg via ORAL
  Filled 2015-09-27: qty 1
  Filled 2015-09-27: qty 2

## 2015-09-27 MED ORDER — ASPIRIN 300 MG RE SUPP
300.0000 mg | RECTAL | Status: AC
  Filled 2015-09-27: qty 1

## 2015-09-27 MED ORDER — TIROFIBAN HCL IN NACL 5-0.9 MG/100ML-% IV SOLN
INTRAVENOUS | Status: DC | PRN
  Administered 2015-09-27: 0.15 ug/kg/min via INTRAVENOUS

## 2015-09-27 MED ORDER — ASPIRIN 81 MG PO TBEC: 81.0000 mg | DELAYED_RELEASE_TABLET | Freq: Every day | ORAL | Status: DC

## 2015-09-27 MED ORDER — ADULT MULTIVITAMIN W/MINERALS CH
1.0000 | ORAL_TABLET | Freq: Every day | ORAL | Status: DC
  Administered 2015-09-27 – 2015-09-30 (×4): 1 via ORAL
  Filled 2015-09-27 (×4): qty 1

## 2015-09-27 MED ORDER — LIDOCAINE HCL (PF) 1 % IJ SOLN
INTRAMUSCULAR | Status: AC
  Filled 2015-09-27: qty 30

## 2015-09-27 MED ORDER — HEPARIN (PORCINE) IN NACL 2-0.9 UNIT/ML-% IJ SOLN
INTRAMUSCULAR | Status: AC
  Filled 2015-09-27: qty 1000

## 2015-09-27 MED ORDER — METHYLPREDNISOLONE SODIUM SUCC 125 MG IJ SOLR
INTRAMUSCULAR | Status: AC
  Administered 2015-09-27: 125 mg
  Filled 2015-09-27: qty 2

## 2015-09-27 MED ORDER — SODIUM CHLORIDE 0.9 % IV SOLN
INTRAVENOUS | Status: DC | PRN
  Administered 2015-09-27: 1.75 mg/kg/h via INTRAVENOUS

## 2015-09-27 MED ORDER — NICOTINE 21 MG/24HR TD PT24
21.0000 mg | MEDICATED_PATCH | Freq: Every day | TRANSDERMAL | Status: DC
Start: 2015-09-27 — End: 2015-09-28
  Administered 2015-09-27 – 2015-09-28 (×2): 21 mg via TRANSDERMAL
  Filled 2015-09-27 (×2): qty 1

## 2015-09-27 MED ORDER — DIPHENHYDRAMINE HCL 50 MG/ML IJ SOLN
INTRAMUSCULAR | Status: AC
  Administered 2015-09-27: 50 mg
  Filled 2015-09-27: qty 1

## 2015-09-27 MED ORDER — HEPARIN SODIUM (PORCINE) 5000 UNIT/ML IJ SOLN
INTRAMUSCULAR | Status: AC
  Filled 2015-09-27: qty 1

## 2015-09-27 MED ORDER — SODIUM CHLORIDE 0.9 % WEIGHT BASED INFUSION
3.0000 mL/kg/h | INTRAVENOUS | Status: AC
  Administered 2015-09-27: 3 mL/kg/h via INTRAVENOUS

## 2015-09-27 MED ORDER — ONDANSETRON HCL 4 MG/2ML IJ SOLN: 4.0000 mg | Freq: Four times a day (QID) | INTRAMUSCULAR | Status: DC | PRN

## 2015-09-27 MED ORDER — MIDAZOLAM HCL 2 MG/2ML IJ SOLN
INTRAMUSCULAR | Status: AC
  Filled 2015-09-27: qty 2

## 2015-09-27 MED ORDER — NITROGLYCERIN 1 MG/10 ML FOR IR/CATH LAB
INTRA_ARTERIAL | Status: AC
  Filled 2015-09-27: qty 10

## 2015-09-27 MED ORDER — METOPROLOL SUCCINATE ER 25 MG PO TB24: 25.0000 mg | ORAL_TABLET | Freq: Every day | ORAL | Status: DC

## 2015-09-27 MED ORDER — SODIUM CHLORIDE 0.9% FLUSH
3.0000 mL | Freq: Two times a day (BID) | INTRAVENOUS | Status: DC
  Administered 2015-09-27 – 2015-09-29 (×5): 3 mL via INTRAVENOUS

## 2015-09-27 MED ORDER — VERAPAMIL HCL 2.5 MG/ML IV SOLN
INTRAVENOUS | Status: DC | PRN
  Administered 2015-09-27: 02:00:00 via INTRA_ARTERIAL

## 2015-09-27 MED ORDER — SODIUM CHLORIDE 0.9% FLUSH: 3.0000 mL | INTRAVENOUS | Status: DC | PRN

## 2015-09-27 MED ORDER — TICAGRELOR 90 MG PO TABS: 90.0000 mg | ORAL_TABLET | Freq: Two times a day (BID) | ORAL | Status: DC

## 2015-09-27 MED ORDER — NICOTINE 21 MG/24HR TD PT24: 21.0000 mg | MEDICATED_PATCH | Freq: Every day | TRANSDERMAL | Status: DC

## 2015-09-27 MED ORDER — POTASSIUM CHLORIDE CRYS ER 20 MEQ PO TBCR
40.0000 meq | EXTENDED_RELEASE_TABLET | Freq: Three times a day (TID) | ORAL | Status: AC
  Administered 2015-09-27 (×4): 40 meq via ORAL
  Filled 2015-09-27 (×5): qty 2

## 2015-09-27 MED ORDER — DIPHENHYDRAMINE HCL 25 MG PO CAPS
50.0000 mg | ORAL_CAPSULE | Freq: Four times a day (QID) | ORAL | Status: DC
  Administered 2015-09-27 – 2015-09-30 (×12): 50 mg via ORAL
  Filled 2015-09-27 (×2): qty 1
  Filled 2015-09-27 (×11): qty 2

## 2015-09-27 MED ORDER — TIROFIBAN HCL IN NACL 5-0.9 MG/100ML-% IV SOLN
INTRAVENOUS | Status: AC
  Filled 2015-09-27: qty 100

## 2015-09-27 MED ORDER — HEPARIN SODIUM (PORCINE) 5000 UNIT/ML IJ SOLN
4000.0000 [IU] | Freq: Once | INTRAMUSCULAR | Status: AC
  Administered 2015-09-27: 4000 [IU] via INTRAVENOUS

## 2015-09-27 MED ORDER — BIVALIRUDIN 250 MG IV SOLR
INTRAVENOUS | Status: AC
  Filled 2015-09-27: qty 250

## 2015-09-27 MED ORDER — LIDOCAINE HCL (PF) 1 % IJ SOLN
INTRAMUSCULAR | Status: DC | PRN
  Administered 2015-09-27: 1 mL

## 2015-09-27 MED ORDER — ALPRAZOLAM 0.25 MG PO TABS: 0.2500 mg | ORAL_TABLET | Freq: Once | ORAL | Status: DC

## 2015-09-27 SURGICAL SUPPLY — 16 items
BALLN ~~LOC~~ TREK RX 3.5X15 (BALLOONS) ×2
BALLOON ~~LOC~~ TREK RX 3.5X15 (BALLOONS) ×1 IMPLANT
CATH EXTRAC PRONTO 5.5F 138CM (CATHETERS) ×2 IMPLANT
CATH INFINITI 5 FR JL3.5 (CATHETERS) ×2 IMPLANT
CATH VISTA GUIDE 6FR JR4 (CATHETERS) ×2 IMPLANT
DEVICE RAD COMP TR BAND LRG (VASCULAR PRODUCTS) ×2 IMPLANT
GLIDESHEATH SLEND SS 6F .021 (SHEATH) ×2 IMPLANT
KIT ENCORE 26 ADVANTAGE (KITS) ×2 IMPLANT
KIT HEART LEFT (KITS) ×2 IMPLANT
PACK CARDIAC CATHETERIZATION (CUSTOM PROCEDURE TRAY) ×2 IMPLANT
TRANSDUCER W/STOPCOCK (MISCELLANEOUS) ×2 IMPLANT
TUBING CIL FLEX 10 FLL-RA (TUBING) ×2 IMPLANT
VALVE GUARDIAN II ~~LOC~~ HEMO (MISCELLANEOUS) ×2 IMPLANT
WIRE ASAHI PROWATER 180CM (WIRE) ×2 IMPLANT
WIRE HI TORQ VERSACORE-J 145CM (WIRE) ×2 IMPLANT
WIRE SAFE-T 1.5MM-J .035X260CM (WIRE) ×2 IMPLANT

## 2015-09-27 NOTE — Progress Notes (Signed)
  Echocardiogram 2D Echocardiogram has been performed.  [name], [name] 09/27/2015, 11:12 AM

## 2015-09-27 NOTE — ED Provider Notes (Signed)
[facility]-EMERGENCY DEPT Provider Note   CSN: [id number] Arrival date & time: 09/27/15  0112  By signing my name below, I, Sandrea Hammond, attest that this documentation has been prepared under the direction and in the presence of Kabeer Hoagland, MD. Electronically Signed: Sandrea Hammond, ED Scribe. 09/27/15. 2:27 AM.     History   Chief Complaint Chief Complaint  Patient presents with  . Code STEMI    LEVEL 5 CAVEAT: HPI and ROS limited due to pt condition   HPI Comments: [name] is a 40 y.o. male who presents to the Emergency Department complaining of moderate, intermittent chest pain with onset 2 wks pta, worsening 1 hour ago. Per EMS EKG en route showed inferior changes consistent with MI, probably instant. Pt states he recently ran out of his Brilinta, which he takes for CAD. He states his pain is worsened with positional changes. He also reports decreased appetite for 1 wk.   The history is provided by the patient and the EMS personnel. The history is limited by the condition of the patient. No language interpreter was used.  Chest Pain   This is a new problem. The current episode started more than 1 week ago. The problem occurs daily. The problem has been rapidly worsening. The pain is associated with movement. The pain is present in the substernal region. The pain is moderate. The quality of the pain is described as dull. The symptoms are aggravated by certain positions. Associated symptoms include diaphoresis.  His past medical history is significant for CAD and hypertension.  Pertinent negatives for past medical history include no diabetes.  Pertinent negatives for family medical history include: no aortic dissection.  Procedure history is positive for cardiac catheterization and echocardiogram.    Past Medical History:  Diagnosis Date  . Anxiety   . Bipolar 1 disorder (HCC)   . CAD S/P percutaneous coronary angioplasty    a. Inf STEMI s/p BMS to RCA 01/2007. b.  NSTEMI 07/2013 s/p PTCA to RCA for ISR; c. Transient inferior ST elevation (peak Ti 0.25) 01/2014 s/p PTCA/DES to pRCA, PTCA/DES to dRCA, EF 50%; d. 12/2014 Inf STEMI: RCA patent prox stent, 116m/d (3.0x32 Synergy DES), EF 35-45; e. 05/2015 Inf STEMI/Cath: LM nl, LAD 10ost/m, D1 75, LCX nl, OM1 25, OM2 30, RCA patent stents, RPDA 30ost.  . Hx of medication noncompliance   . Hyperlipidemia   . Hypertensive heart disease   . Ischemic cardiomyopathy    a. EF 40% in 2011, 60% in 2012. b. EF 55% by cath 07/2013. c. EF 50% by cath 01/2014; d. 12/2014 EF 35-45% by LV gram; e. 05/2015 Echo: EF 50-55% inflat/inf AK, mild AI; f. 06/2015 cMRI: EF 49%, basal & mid inf full thickness scar, subendocardial scar in antsept and antlat wall, correlating w/ D1 dzs.  Marland Kitchen NSVT (nonsustained ventricular tachycardia) (HCC)    a. Very brief run during 07/2013 admit for NSTEMI felt due to MI.  . Polysubstance abuse    a. h/o tobacco, marijuana and crack cocaine use. b. 07/2013: +UDS THC, neg for cocaine.  . Tobacco abuse     Patient Active Problem List   Diagnosis Date Noted  . Hyperlipidemia   . Schizoaffective disorder, bipolar type (HCC) 03/06/2015  . Nausea with vomiting 03/06/2015  . Hypertensive heart disease 01/11/2015  . ST elevation myocardial infarction (STEMI) of inferior wall (HCC) 01/10/2015  . ST elevation myocardial infarction (STEMI) of inferior wall, initial episode of care (HCC) 01/10/2015  . Medical non-compliance   .  STEMI (ST elevation myocardial infarction) (HCC) 01/15/2014  . Hx of medication noncompliance   . NSVT (nonsustained ventricular tachycardia) (HCC)   . Ischemic cardiomyopathy   . Tobacco abuse   . Anxiety   . 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 DES PCI to RCA x 2 (3.0 mm x 22 mm Resolute - distal & proximal RCA) 01/14/2014    Class: Present on Admission  . Non-STEMI (non-ST elevated myocardial infarction) (HCC) 07/22/2013  . Atypical chest pain 07/21/2013  . Bipolar 1 disorder,  depressed (HCC) 07/21/2013  . Chest pain 04/16/2013  . HLD (hyperlipidemia) 04/16/2013  . Hyperlipidemia with target LDL less than 70 01/27/2009  . SUBSTANCE ABUSE 01/27/2009  . DEPRESSION 01/27/2009  . Essential hypertension 01/27/2009  . CAD S/P BMS PCI to RCA with PTCA for ISR --> followed by stent thrombosis - DES PCI 01/27/2009  . GASTRITIS 01/27/2009    Past Surgical History:  Procedure Laterality Date  . CARDIAC CATHETERIZATION  01/16/2009   normal left main, Cfx with 2-OMs both w/minor irregularities, LAd with 20-30% mid region irregularities, ramus intermediate/optional diagonal with 60% osital narrowing, RCA with stent in distal portion w/20% prox in-stent stenosis (Dr. Mervyn Skeeters. [place])  . CARDIAC CATHETERIZATION  07/23/2013   two vessel obstructive CAD, occluded first diagonal, focal in-stent restenosis in distal RCA (Dr. Peter Swaziland)  . CARDIAC CATHETERIZATION N/A 01/10/2015   Procedure: Left Heart Cath and Coronary Angiography;  Surgeon: Marykay Lex, MD;  Location: [facility] INVASIVE CV LAB;  Service: Cardiovascular;  Laterality: N/A;  . CARDIAC CATHETERIZATION N/A 01/10/2015   Procedure: Coronary Stent Intervention;  Surgeon: Marykay Lex, MD;  Location: [facility] INVASIVE CV LAB;  Service: Cardiovascular;  Laterality: N/A;  . CARDIAC CATHETERIZATION N/A 06/10/2015   Procedure: Left Heart Cath and Coronary Angiography;  Surgeon: Peter M Swaziland, MD;  Location: [facility] INVASIVE CV LAB;  Service: Cardiovascular;  Laterality: N/A;  . CORONARY ANGIOPLASTY WITH STENT PLACEMENT  02/05/2007   3.5x82mm Quantum non-DES to RCA (Dr. Nicki Guadalajara)  . FEMORAL ARTERY STENT    . LEFT AND RIGHT HEART CATHETERIZATION WITH CORONARY ANGIOGRAM N/A 01/14/2014   Procedure: LEFT AND RIGHT HEART CATHETERIZATION WITH CORONARY ANGIOGRAM;  Surgeon: Kathleene Hazel, MD;  Location: [facility] CATH LAB;  Service: Cardiovascular;  Laterality: N/A;  . LEFT HEART CATHETERIZATION WITH CORONARY ANGIOGRAM N/A 07/23/2013   Procedure:  LEFT HEART CATHETERIZATION WITH CORONARY ANGIOGRAM;  Surgeon: Peter M Swaziland, MD;  Location: [facility] CATH LAB;  Service: Cardiovascular;  Laterality: N/A;       Home Medications    Prior to Admission medications   Medication Sig Start Date End Date Taking? Authorizing Provider  ARIPiprazole (ABILIFY) 5 MG tablet Take one tab (5 mg) in the morning.  Then tale 1 tab (5 mg) in the evening. 03/11/15   Adonis Brook, NP  ARIPiprazole 400 MG SUSR Inject 400 mg into the muscle every 28 (twenty-eight) days. Last dose given 03/11/2015.  Next dose due 04/08/15 03/11/15   Adonis Brook, NP  aspirin EC 81 MG EC tablet Take 1 tablet (81 mg total) by mouth daily. 03/11/15   Adonis Brook, NP  benztropine (COGENTIN) 0.5 MG tablet Take one tab (0.5 mg) with breakfast.  Then take another tab (0.5 mg) in the evening. 03/11/15   Adonis Brook, NP  lisinopril (PRINIVIL,ZESTRIL) 5 MG tablet Take 1 tablet (5 mg total) by mouth daily. 07/07/15   Massie Maroon, FNP  metoprolol succinate (TOPROL-XL) 25 MG 24 hr tablet Take 1 tablet (25  mg total) by mouth daily. 07/07/15   Massie Maroon, FNP  nicotine (NICODERM CQ - DOSED IN MG/24 HOURS) 21 mg/24hr patch Place 1 patch (21 mg total) onto the skin daily. 03/11/15   Adonis Brook, NP  nitroGLYCERIN (NITROSTAT) 0.4 MG SL tablet Place 1 tablet (0.4 mg total) under the tongue every 5 (five) minutes x 3 doses as needed for chest pain. 06/11/15   Azalee Course, PA  pravastatin (PRAVACHOL) 40 MG tablet Take 1 tablet (40 mg total) by mouth daily. 07/07/15   Massie Maroon, FNP  ticagrelor (BRILINTA) 90 MG TABS tablet Take 1 tablet (90 mg total) by mouth 2 (two) times daily. 07/07/15   Massie Maroon, FNP  traZODone (DESYREL) 100 MG tablet Take 1 tablet (100 mg total) by mouth at bedtime as needed for sleep. 03/11/15   Adonis Brook, NP    Family History Family History  Problem Relation Age of Onset  . CAD Mother   . Heart disease Mother   . Heart attack Mother   . Schizophrenia  Mother   . CAD Sister     Social History Social History  Substance Use Topics  . Smoking status: Current Every Day Smoker    Packs/day: 0.25    Years: 30.00    Types: Cigarettes  . Smokeless tobacco: Never Used  . Alcohol use 0.0 oz/week     Comment: 80 oz of beer weekly     Allergies   Iodine; Shellfish allergy; and Contrast media [iodinated diagnostic agents]   Review of Systems Review of Systems  Unable to perform ROS: Acuity of condition  Constitutional: Positive for diaphoresis.     Physical Exam Updated Vital Signs BP 127/93 (BP Location: Right Arm)   Pulse 65   Temp 97.4 F (36.3 C) (Oral)   Resp 19   Ht 6' (1.829 m)   Wt 126 lb (57.2 kg)   SpO2 100%   BMI 17.09 kg/m   Physical Exam  Constitutional: He is oriented to person, place, and time. He appears well-developed and well-nourished.  HENT:  Head: Normocephalic and atraumatic.  Mouth/Throat: Oropharynx is clear and moist. No oropharyngeal exudate.  Moist mucous membranes.   Eyes: Conjunctivae and EOM are normal. Pupils are equal, round, and reactive to light.  Neck: Normal range of motion. Neck supple. No JVD present. No tracheal deviation present.  No carotid bruits. Trachea midline.   Cardiovascular: Normal rate, regular rhythm and normal heart sounds.  Exam reveals no gallop and no friction rub.   No murmur heard. RRR. Intact post tib pulses  Pulmonary/Chest: Effort normal and breath sounds normal. No stridor. No respiratory distress. He has no wheezes. He has no rales.  Lungs CTA bilaterally.   Abdominal: Soft. Bowel sounds are normal. He exhibits no distension. There is no rebound and no guarding.  Musculoskeletal: Normal range of motion. He exhibits no edema.  All compartment soft, no edema.  Lymphadenopathy:    He has no cervical adenopathy.  Neurological: He is alert and oriented to person, place, and time. He has normal reflexes.  Skin: Skin is warm and dry. Capillary refill takes less  than 2 seconds.  No lesions.   Psychiatric: He has a normal mood and affect.  Nursing note and vitals reviewed.    ED Treatments / Results   DIAGNOSTIC STUDIES: Oxygen Saturation is 100% on RA, normal by my interpretation.    COORDINATION OF CARE: 2:17 AM Discussed treatment plan with pt at bedside which includes  lab work, cxr and pt agreed to plan.   Labs (all labs ordered are listed, but only abnormal results are displayed) Labs Reviewed  CBC WITH DIFFERENTIAL/PLATELET - Abnormal; Notable for the following:       Result Value   Hemoglobin 17.1 (*)    All other components within normal limits  BASIC METABOLIC PANEL  TROPONIN I  CBC  BASIC METABOLIC PANEL  MAGNESIUM  URINE RAPID DRUG SCREEN, HOSP PERFORMED   Vitals:   09/27/15 0125  BP: 127/93  Pulse: 65  Resp: 19  Temp: 97.4 F (36.3 C)   Medications  ALPRAZolam (XANAX) tablet 0.25 mg ( Oral Automatically Held 09/27/15 0145)  famotidine (PEPCID) IVPB 20 mg premix (20 mg Intravenous Given 09/27/15 0145)  0.9 %  sodium chloride infusion (not administered)  aspirin chewable tablet 324 mg (not administered)    Or  aspirin suppository 300 mg (not administered)  midazolam (VERSED) injection (2 mg Intravenous Given 09/27/15 0149)  fentaNYL (SUBLIMAZE) injection (25 mcg Intravenous Given 09/27/15 0149)  lidocaine (PF) (XYLOCAINE) 1 % injection (1 mL Infiltration Given 09/27/15 0149)  Radial Cocktail/Verapamil only ( Intra-arterial Given 09/27/15 0151)  metoprolol succinate (TOPROL-XL) 24 hr tablet 25 mg (not administered)  captopril (CAPOTEN) tablet 6.25 mg (not administered)  ARIPiprazole (ABILIFY) tablet 5 mg (not administered)  nicotine (NICODERM CQ - dosed in mg/24 hours) patch 21 mg (not administered)  pravastatin (PRAVACHOL) tablet 40 mg (not administered)  bivalirudin (ANGIOMAX) BOLUS via infusion (42.9 mg Intravenous Given 09/27/15 0200)  bivalirudin (ANGIOMAX) 250 mg in sodium chloride 0.9 % 50 mL (5 mg/mL) infusion  (1.75 mg/kg/hr  57.2 kg Intravenous New Bag/Given 09/27/15 0202)  tirofiban (AGGRASTAT) bolus via infusion (1,430 mcg Intravenous Given 09/27/15 0204)  tirofiban (AGGRASTAT) infusion 50 mcg/mL 100 mL (0.15 mcg/kg/min  57.2 kg Intravenous New Bag/Given 09/27/15 0209)  diphenhydrAMINE (BENADRYL) capsule 50 mg (not administered)  nitroGLYCERIN 1 mg/10 ml (100 mcg/ml) - IR/CATH LAB (200 mcg Intracoronary Given 09/27/15 0215)  ticagrelor (BRILINTA) tablet (180 mg Oral Given 09/27/15 0225)  heparin injection 4,000 Units (4,000 Units Intravenous Given 09/27/15 0117)  methylPREDNISolone sodium succinate (SOLU-MEDROL) 125 mg/2 mL injection (125 mg  Given 09/27/15 0120)  diphenhydrAMINE (BENADRYL) 50 MG/ML injection (50 mg  Given 09/27/15 0120)   Results for orders placed or performed during the hospital encounter of 09/27/15  CBC with Differential/Platelet  Result Value Ref Range   WBC 7.2 4.0 - 10.5 K/uL   RBC 5.40 4.22 - 5.81 MIL/uL   Hemoglobin 17.1 (H) 13.0 - 17.0 g/dL   HCT 75.649.8 43.339.0 - 29.552.0 %   MCV 92.2 78.0 - 100.0 fL   MCH 31.7 26.0 - 34.0 pg   MCHC 34.3 30.0 - 36.0 g/dL   RDW 18.813.6 41.611.5 - 60.615.5 %   Platelets 194 150 - 400 K/uL   Neutrophils Relative % 58 %   Neutro Abs 4.1 1.7 - 7.7 K/uL   Lymphocytes Relative 38 %   Lymphs Abs 2.8 0.7 - 4.0 K/uL   Monocytes Relative 3 %   Monocytes Absolute 0.2 0.1 - 1.0 K/uL   Eosinophils Relative 1 %   Eosinophils Absolute 0.1 0.0 - 0.7 K/uL   Basophils Relative 0 %   Basophils Absolute 0.0 0.0 - 0.1 K/uL   No results found.    EKG  EKG Interpretation  Date/Time:  Saturday September 27 2015 01:20:30 EDT Ventricular Rate:  77 PR Interval:    QRS Duration: 113 QT Interval:  424 QTC Calculation: 480 R  Axis:   62 Text Interpretation:  Sinus rhythm Biatrial enlargement  Incomplete right bundle branch block Inferior infarct, acute  STEMI Confirmed by [facility]  MD, Kiira Brach ([id number]) on 09/27/2015 1:25:40 AM       Radiology No results  found.  Procedures Procedures (including critical care time)  Medications Ordered in ED Medications  ALPRAZolam (XANAX) tablet 0.25 mg ( Oral Automatically Held 09/27/15 0145)  famotidine (PEPCID) IVPB 20 mg premix (20 mg Intravenous Given 09/27/15 0145)  0.9 %  sodium chloride infusion (not administered)  aspirin chewable tablet 324 mg (not administered)    Or  aspirin suppository 300 mg (not administered)  midazolam (VERSED) injection (2 mg Intravenous Given 09/27/15 0149)  fentaNYL (SUBLIMAZE) injection (25 mcg Intravenous Given 09/27/15 0149)  lidocaine (PF) (XYLOCAINE) 1 % injection (1 mL Infiltration Given 09/27/15 0149)  Radial Cocktail/Verapamil only ( Intra-arterial Given 09/27/15 0151)  metoprolol succinate (TOPROL-XL) 24 hr tablet 25 mg (not administered)  captopril (CAPOTEN) tablet 6.25 mg (not administered)  ARIPiprazole (ABILIFY) tablet 5 mg (not administered)  nicotine (NICODERM CQ - dosed in mg/24 hours) patch 21 mg (not administered)  pravastatin (PRAVACHOL) tablet 40 mg (not administered)  bivalirudin (ANGIOMAX) BOLUS via infusion (42.9 mg Intravenous Given 09/27/15 0200)  bivalirudin (ANGIOMAX) 250 mg in sodium chloride 0.9 % 50 mL (5 mg/mL) infusion (1.75 mg/kg/hr  57.2 kg Intravenous New Bag/Given 09/27/15 0202)  tirofiban (AGGRASTAT) bolus via infusion (1,430 mcg Intravenous Given 09/27/15 0204)  tirofiban (AGGRASTAT) infusion 50 mcg/mL 100 mL (0.15 mcg/kg/min  57.2 kg Intravenous New Bag/Given 09/27/15 0209)  diphenhydrAMINE (BENADRYL) capsule 50 mg (not administered)  heparin injection 4,000 Units (4,000 Units Intravenous Given 09/27/15 0117)  methylPREDNISolone sodium succinate (SOLU-MEDROL) 125 mg/2 mL injection (125 mg  Given 09/27/15 0120)  diphenhydrAMINE (BENADRYL) 50 MG/ML injection (50 mg  Given 09/27/15 0120)     Initial Impression / Assessment and Plan / ED Course  I have reviewed the triage vital signs and the nursing notes.  Pertinent labs & imaging  results that were available during my care of the patient were reviewed by me and considered in my medical decision making (see chart for details).  Clinical Course    To the cath lab secondary to STEMI cardiology at the bedside   Final Clinical Impressions(s) / ED Diagnoses   Final diagnoses:  ST elevation myocardial infarction involving right coronary artery [facility])    New Prescriptions Current Discharge Medication List    I personally performed the services described in this documentation, which was scribed in my presence. The recorded information has been reviewed and is accurate.      Cy Blamer, MD 09/27/15 [phone]

## 2015-09-27 NOTE — Progress Notes (Signed)
CRITICAL VALUE ALERT  Critical value received:  09/27/15  Date of notification:  09/27/15  Time of notification:  0413  Critical value read back:Yes.    Nurse who received alert:  Clarita LeberJean Livingston, RN  MD notified (1st page):  [facility] AuthorityVaranasi  Same lab value as post cath, patient given 40meq K orally. Will continue to monitor.

## 2015-09-27 NOTE — ED Triage Notes (Signed)
Pt coming via EMs. Pt was attempting to lay down when he had an suddent onset of CP. Pt rated pain 10/10. EMS gave 324 ASA and 1 NTG. 20 LAC. Pt has an extensive cardiac hx. Pt as had 5 Heart attacks in the past. Pt has had 4 stents and a balloon placed. Pt was diaphoretic upon EMS arrival. Pt has a hx of schizophrenia and bipolar. Lungs were clear with EMS  124/60 BP 82 HR 16 R

## 2015-09-27 NOTE — Progress Notes (Addendum)
Initial Nutrition Assessment  DOCUMENTATION CODES:   Underweight, Non-severe (moderate) malnutrition in context of chronic illness  INTERVENTION:  Monitor magnesium, potassium, and phosphorus daily for at least 3 days, MD to replete as needed, as pt is at risk for refeeding syndrome given emesis x 1 week and Poor PO for several weeks. Recommend providing 200 mg of thiamine for 5 days due to risk of refeeding syndrome Provide Multivitamin with minerals daily  Once refeeding labs are stabilized ass Ensure Enlive po BID, each supplement provides 350 kcal and 20 grams of protein Add Snacks BID  Monitor PO intake for adequacy   NUTRITION DIAGNOSIS:   Inadequate oral intake related to poor appetite as evidenced by per patient/family report, other (see comment), percent weight loss (underweight BMI).   GOAL:   Patient will meet greater than or equal to 90% of their needs   MONITOR:   PO intake, Supplement acceptance, Labs, Skin, I & O's  REASON FOR ASSESSMENT:   Malnutrition Screening Tool    ASSESSMENT:   40yoM with hx of HTN, HLD, early family hx of CAD, smoking, polysubstance abuse, known CAD s/p multiple stents,  homelessness, bipolar disorder, schizoaffective disorder, presents with acute onset severe chest pain with ECG showing inferior STEMI with reciprocal depressions in I and AVL.  Per chart, pt has had chest pain x 2 weeks and a poor appetite x 1 week. Per malnutrition screening tool, pt has lost > 14 lbs. Per weight history, pt has lost from 133 lbs to 126 lbs in the past 3 months- 5% weight loss. Weight has fluctuated from 113 lbs to 145 lbs in the past 2 years.  Pt reports having emesis for the past week and losing from 140 lbs to 126 lbs. He states that he doesn't usually eat much anyway due to his Bipolar/depression- he tends to starve himself. He reports that his mother used to lock the refrigerator and cabinets when he was a child and he is used to feeling hungry.  He reports eating well at breakfast this morning and was able to keep food down.  RD emphasized the importance of nutrition. Encouraged pt to eat frequent meals and snacks and to drink nutritional supplements. He states that he has used Ensure in the past.   Labs: low potassium, low chloride; urine drug screen positive for benzodiazepines, amphetamines, and THC  Diet Order:  Diet Heart Room service appropriate? Yes; Fluid consistency: Thin  Skin:  Reviewed, no issues  Last BM:  PTA  Height:   Ht Readings from Last 1 Encounters:  09/27/15 6' (1.829 m)    Weight:   Wt Readings from Last 1 Encounters:  09/27/15 126 lb (57.2 kg)    Ideal Body Weight:  80.9 kg  BMI:  Body mass index is 17.09 kg/m.  Estimated Nutritional Needs:   Kcal:  2100-2300  Protein:  70-80 grams  Fluid:  2.1-2.3 L/day  EDUCATION NEEDS:   No education needs identified at this time  Dorothea Ogleeanne Piya Mesch RD, CSP, LDN Inpatient Clinical Dietitian Pager: [phone] After Hours Pager: [phone]

## 2015-09-27 NOTE — H&P (Addendum)
RFA: STEMI  HPI: [name] with hx of HTN, HLD, early family hx of CAD, smoking, polysubstance abuse, known CAD s/p multiple stents,  homelessness, bipolar disorder, schizoaffective disorder, presents with acute onset severe chest pain with ECG showing inferior STEMI with reciprocal depressions in I and AVL.  Symptoms have been sputtering for the past week.  He ran out of brillenta two weeks ago.  1 hour prior to admission pain came on suddenly and strongly 8/10.  Pain is associated with nausea, diaphoresis.  In field he received 325 ASA and 1 NTG with mild relief of pain to 7/10.  +Marajuana use today, but denies other drugs.  Says he has run out of all his medications except for Aspirin  In ER he received 4000 heparin, and IV bendadryl + solumedrol for IV contrast allergy  ROS is otherwise notable for orthopnea and doe   Past Medical History:  Diagnosis Date  . Anxiety   . Bipolar 1 disorder (HCC)   . CAD S/P percutaneous coronary angioplasty    a. Inf STEMI s/p BMS to RCA 01/2007. b. NSTEMI 07/2013 s/p PTCA to RCA for ISR; c. Transient inferior ST elevation (peak Ti 0.25) 01/2014 s/p PTCA/DES to pRCA, PTCA/DES to dRCA, EF 50%; d. 12/2014 Inf STEMI: RCA patent prox stent, 14836m/d (3.0x32 Synergy DES), EF 35-45; e. 05/2015 Inf STEMI/Cath: LM nl, LAD 10ost/m, D1 75, LCX nl, OM1 25, OM2 30, RCA patent stents, RPDA 30ost.  . Hx of medication noncompliance   . Hyperlipidemia   . Hypertensive heart disease   . Ischemic cardiomyopathy    a. EF 40% in 2011, 60% in 2012. b. EF 55% by cath 07/2013. c. EF 50% by cath 01/2014; d. 12/2014 EF 35-45% by LV gram; e. 05/2015 Echo: EF 50-55% inflat/inf AK, mild AI; f. 06/2015 cMRI: EF 49%, basal & mid inf full thickness scar, subendocardial scar in antsept and antlat wall, correlating w/ D1 dzs.  Marland Kitchen. NSVT (nonsustained ventricular tachycardia) (HCC)    a. Very brief run during 07/2013 admit for NSTEMI felt due to MI.  . Polysubstance abuse    a. h/o tobacco,  marijuana and crack cocaine use. b. 07/2013: +UDS THC, neg for cocaine.  . Tobacco abuse    No current facility-administered medications on file prior to encounter.    Current Outpatient Prescriptions on File Prior to Encounter  Medication Sig Dispense Refill  . ARIPiprazole (ABILIFY) 5 MG tablet Take one tab (5 mg) in the morning.  Then tale 1 tab (5 mg) in the evening. 60 tablet 0  . ARIPiprazole 400 MG SUSR Inject 400 mg into the muscle every 28 (twenty-eight) days. Last dose given 03/11/2015.  Next dose due 04/08/15 1 each 0  . aspirin EC 81 MG EC tablet Take 1 tablet (81 mg total) by mouth daily. 30 tablet 0  . benztropine (COGENTIN) 0.5 MG tablet Take one tab (0.5 mg) with breakfast.  Then take another tab (0.5 mg) in the evening. 60 tablet 0  . lisinopril (PRINIVIL,ZESTRIL) 5 MG tablet Take 1 tablet (5 mg total) by mouth daily. 90 tablet 1  . metoprolol succinate (TOPROL-XL) 25 MG 24 hr tablet Take 1 tablet (25 mg total) by mouth daily. 90 tablet 1  . nicotine (NICODERM CQ - DOSED IN MG/24 HOURS) 21 mg/24hr patch Place 1 patch (21 mg total) onto the skin daily. 28 patch 0  . nitroGLYCERIN (NITROSTAT) 0.4 MG SL tablet Place 1 tablet (0.4 mg total) under the tongue every 5 (five) minutes  x 3 doses as needed for chest pain. 25 tablet 3  . pravastatin (PRAVACHOL) 40 MG tablet Take 1 tablet (40 mg total) by mouth daily. 90 tablet 1  . ticagrelor (BRILINTA) 90 MG TABS tablet Take 1 tablet (90 mg total) by mouth 2 (two) times daily. 60 tablet 11  . traZODone (DESYREL) 100 MG tablet Take 1 tablet (100 mg total) by mouth at bedtime as needed for sleep. 30 tablet 0    Alllergy  Allergies  Allergen Reactions  . Iodine Shortness Of Breath and Swelling  . Shellfish Allergy Anaphylaxis    All shellfish  . Contrast Media [Iodinated Diagnostic Agents] Nausea And Vomiting   Family History  Problem Relation Age of Onset  . CAD Mother   . Heart disease Mother   . Heart attack Mother   .  Schizophrenia Mother   . CAD Sister    Exam BP 127/93 (BP Location: Right Arm)   Pulse 65   Temp 97.4 F (36.3 C) (Oral)   Resp 19   Ht 6' (1.829 m)   Wt 57.2 kg (126 lb)   SpO2 100%   BMI 17.09 kg/m   Thin 40 yo in mild distress, tremulous JVP normal RRR s1 s2  CTAB Soft No edema AAO without gross focal deficit  Labs Pending  Echo 06/10/15 "Left ventricle:  There is a dense rounded density within the myocardium near the posterior mitral valve annulus of unknown signifcance. Recommend MRI for further evaluation. The cavity size was normal. Systolic function was normal. The estimated ejection fraction was in the range of 50% to 55%.  Regional wall motion abnormalities:   There is akinesis of the entireinferolateral and inferior myocardium. The transmitral flow pattern was normal. The deceleration time of the early transmitral flow velocity was normal. The pulmonary vein flow pattern was normal. The tissue Doppler parameters were normal. Left ventricular diastolic function parameters were normal."   A/P [name] with hx of CAD presents with STEMI after being off brillenta for 2 weeks.  Has stent to RCA.  Suspect in-stent thrombosis.  Emergent LHC for likely PCI  Addendum: #Stemi 1. Pravastatin 40 2. Metoprolol 25 xl, captopril 6.25 tid  3. ASA/Brillenta 4. Social work consult 5. Urine drug screen 6. Echo tomorrow  #Tremulousness 1. Watch for withdrawal.  Not tachycardic   # Contrast allergy - s/p solumedrol, 50 IV benadryl, and 20 famotidine - benadryl ordered prn  #FEN No ivf K>4, MG>2 Cardiac diet  Dossie Arbour, MD   I have examined the patient and reviewed assessment and plan and discussed with patient.  Agree with above as stated.  I ersonally reviewed the ECG and made the decision to bring him to the cath lab.  Avoided an additional stent at cath due to issues with noncompliance.  Social work consult will be key.  Will need help with psych meds.  Area  of thrombosis was dilated with a bigger balloon during this procedure.  Replace potassium.   Lance Muss, MD

## 2015-09-27 NOTE — Progress Notes (Addendum)
CARDIAC REHAB PHASE I   PRE:  Rate/Rhythm: 92 sinus rhythm   BP:  Supine:   Sitting: 134/99  Standing:    SaO2: 99% ra  MODE:  Ambulation: 600 ft   POST:  Rate/Rhythem: 66 sinus rhythm  BP:  Supine:   Sitting: 136/108  Standing:    SaO2: 98% ra  1003-1110  Pt ambulated in hallway x1 assist, steady gait, without difficulty.  Pt returned to bed, call light in reach.  Education completed including risk factor modification, low fat-low cholesterol diet, exercise, and medication compliance.  Pt oriented to outpatient cardiac rehab.  [facility]  Pt instructed to discuss need for PCP for medication assistance and medicaid application with Child psychotherapistsocial worker. Pt given financial assistance application for cardiac rehab.  Pt is presently homeless and has several barriers.  Pt will need social work assistance to help with resources to reduce these barriers.  Smoking cessation counseling provided. Pt instructed to call [phone].  Pt seems interested in  quitting. Pt offered emotional support and reassurance. Understanding verbalized.              CiscoJoann Hamilton Antionne Enrique

## 2015-09-27 NOTE — Progress Notes (Signed)
   09/27/15 0105  Clinical Encounter Type  Visited With Health care provider  Visit Type ED  Referral From Other (Comment) (Pager-Stemi)  Spiritual Encounters  Spiritual Needs Other (Comment) (Available but not requested)  Stress Factors  Patient Stress Factors Not reviewed  Pt sent to 2h. No family present. No concerns identified.

## 2015-09-28 DIAGNOSIS — F1910 Other psychoactive substance abuse, uncomplicated: Secondary | ICD-10-CM

## 2015-09-28 DIAGNOSIS — E7800 Pure hypercholesterolemia, unspecified: Secondary | ICD-10-CM

## 2015-09-28 DIAGNOSIS — F3170 Bipolar disorder, currently in remission, most recent episode unspecified: Secondary | ICD-10-CM

## 2015-09-28 DIAGNOSIS — I519 Heart disease, unspecified: Secondary | ICD-10-CM

## 2015-09-28 DIAGNOSIS — E440 Moderate protein-calorie malnutrition: Secondary | ICD-10-CM

## 2015-09-28 DIAGNOSIS — I25110 Atherosclerotic heart disease of native coronary artery with unstable angina pectoris: Secondary | ICD-10-CM

## 2015-09-28 DIAGNOSIS — Z590 Homelessness: Secondary | ICD-10-CM

## 2015-09-28 LAB — BASIC METABOLIC PANEL
Anion gap: 5 (ref 5–15)
BUN: 9 mg/dL (ref 6–20)
CHLORIDE: 103 mmol/L (ref 101–111)
CO2: 27 mmol/L (ref 22–32)
CREATININE: 1.39 mg/dL — AB (ref 0.61–1.24)
Calcium: 8.8 mg/dL — ABNORMAL LOW (ref 8.9–10.3)
GFR calc Af Amer: >60 mL/min (ref >60)
GLUCOSE: 142 mg/dL — AB (ref 65–99)
POTASSIUM: 4 mmol/L (ref 3.5–5.1)
SODIUM: 135 mmol/L (ref 135–145)

## 2015-09-28 MED ORDER — NICOTINE 14 MG/24HR TD PT24
14.0000 mg | MEDICATED_PATCH | Freq: Every day | TRANSDERMAL | Status: DC
  Administered 2015-09-29 – 2015-09-30 (×2): 14 mg via TRANSDERMAL
  Filled 2015-09-28 (×2): qty 1

## 2015-09-28 NOTE — Progress Notes (Signed)
Patient Name: [name] Date of Encounter: 09/28/2015  Active Problems:   ST elevation myocardial infarction involving right coronary artery (HCC)   Malnutrition of moderate degree   Length of Stay: 1  SUBJECTIVE  No angina, no dyspnea.  CURRENT MEDS . ALPRAZolam  0.25 mg Oral Once  . ARIPiprazole  5 mg Oral BID  . aspirin  81 mg Oral Daily  . diphenhydrAMINE  50 mg Oral Q6H  . lisinopril  5 mg Oral Daily  . metoprolol succinate  25 mg Oral Daily  . multivitamin with minerals  1 tablet Oral Daily  . nicotine  21 mg Transdermal Daily  . pravastatin  40 mg Oral q1800  . sodium chloride flush  3 mL Intravenous Q12H  . ticagrelor  90 mg Oral BID    OBJECTIVE   Intake/Output Summary (Last 24 hours) at 09/28/15 0821 Last data filed at 09/28/15 0000  Gross per 24 hour  Intake              240 ml  Output              650 ml  Net             -410 ml   Filed Weights   09/27/15 0123 09/28/15 0800  Weight: 57.2 kg (126 lb) 57.3 kg (126 lb 5.2 oz)    PHYSICAL EXAM Vitals:   09/28/15 0400 09/28/15 0500 09/28/15 0600 09/28/15 0800  BP: 102/85     Pulse: 75 (!) 59 78   Resp:  (!) 23 (!) 23   Temp: 98 F (36.7 C)     TempSrc: Oral     SpO2: 97% 98% 99%   Weight:    57.3 kg (126 lb 5.2 oz)  Height:       General: Alert, oriented x3, no distress Head: no evidence of trauma, PERRL, EOMI, no exophtalmos or lid lag, no myxedema, no xanthelasma; normal ears, nose and oropharynx Neck: normal jugular venous pulsations and no hepatojugular reflux; brisk carotid pulses without delay and no carotid bruits Chest: clear to auscultation, no signs of consolidation by percussion or palpation, normal fremitus, symmetrical and full respiratory excursions Cardiovascular: normal position and quality of the apical impulse, regular rhythm, normal first and second heart sounds, no rubs or gallops, no murmur Abdomen: no tenderness or distention, no masses by palpation, no abnormal  pulsatility or arterial bruits, normal bowel sounds, no hepatosplenomegaly Extremities: no clubbing, cyanosis or edema; 2+ radial, ulnar and brachial pulses bilaterally; 2+ right femoral, posterior tibial and dorsalis pedis pulses; 2+ left femoral, posterior tibial and dorsalis pedis pulses; no subclavian or femoral bruits Neurological: grossly nonfocal  LABS  CBC  Recent Labs  09/27/15 0159 09/27/15 0301 09/27/15 0844  WBC 7.2 8.9  --   NEUTROABS 4.1  --   --   HGB 17.1* 16.5  --   HCT 49.8 47.4  --   MCV 92.2 91.9  --   PLT 194 224 221   Basic Metabolic Panel  Recent Labs  09/27/15 0159 09/27/15 0301  NA 134* 135  K 2.7* 2.7*  CL 93* 95*  CO2 23 29  GLUCOSE 134* 128*  BUN 8 7  CREATININE 1.45* 1.35*  CALCIUM 9.3 8.8*  MG  --  2.0   Liver Function Tests No results for input(s): AST, ALT, ALKPHOS, BILITOT, PROT, ALBUMIN in the last 72 hours. No results for input(s): LIPASE, AMYLASE in the last 72 hours. Cardiac Enzymes  Recent  Labs  09/27/15 0159  TROPONINI 0.03*   Radiology Studies Imaging results have been reviewed and Dg Chest Portable 1 View  Result Date: 09/27/2015 CLINICAL DATA:  40 y/o  M; Cath lab post procedure. EXAM: PORTABLE CHEST 1 VIEW COMPARISON:  07/21/2013 chest radiograph. FINDINGS: The heart size and mediastinal contours are within normal limits. Both lungs are clear. The visualized skeletal structures are unremarkable. IMPRESSION: No active disease. Electronically Signed   By: Mitzi HansenLance  Furusawa-Stratton M.D.   On: 09/27/2015 03:39    TELE NSR  ECHO Systolic function was mildly to moderately reduced. The estimated ejection fraction was in the range of 40% to 45%. Regional wall motion abnormalities:   Severe hypokinesis and scarring of the basal-midinferolateral, inferior, and inferoseptal myocardium; consistent with infarction in the distribution of the right coronary artery   ASSESSMENT AND PLAN  1. S/P inferior wall STEMI/acute stent  thrombosis: clinically well, repeat ECG pending. Transfer to telemetry. Need to find a sure way for him to receive dual antiplatelet therapy for 12 months.  2. CAD: premature onset, high risk for progression to severe cardiomyopathy 3. LV systolic dysfunction:  So far without clinical HF. Carvedilol and ACEi preferred, but relatively low BP limits doses. 4. Hypokalemia: severe, replacing. 5. Acute renal insufficiency:  Showing slow improvement, recheck today. 6. Hyperlipidemia: LDL 160, statin prescribed, probably not taking. Target LDL <70. 7. Medication noncompliance:  Partly clearly due to social issues (homelessness), but he demonstrates a good degree of nonchalance about his medications. "Oh, I didn't realize I still had 3 weeks of Brilinta left". Dr. Tresa EndoKelly has given him samples whenever possible. 8. Substance abuse: UDS showed benzos and THC, but also amphetamines. High risk of serious and possibly fatal complications. 9. Psychiatric disorder:  Bears diagnosis of schizoaffective disorder and bipolar disorder. He seems well compensated right now. Probably interconnected with his chemical abuse and social issues. Needs social work support, care management involvement (maybe [facility] ?) and psych follow up.   Thurmon FairMihai Emy Angevine, MD, [facility] [phone] office [phone] pager 09/28/2015 8:21 AM

## 2015-09-29 ENCOUNTER — Encounter (HOSPITAL_COMMUNITY): Payer: Self-pay | Admitting: Interventional Cardiology

## 2015-09-29 LAB — POCT ACTIVATED CLOTTING TIME: ACTIVATED CLOTTING TIME: 588 s

## 2015-09-29 LAB — POCT I-STAT, CHEM 8
BUN: 7 mg/dL (ref 6–20)
CALCIUM ION: 1.07 mmol/L — AB (ref 1.15–1.40)
Chloride: 92 mmol/L — ABNORMAL LOW (ref 101–111)
Creatinine, Ser: 1.2 mg/dL (ref 0.61–1.24)
Glucose, Bld: 149 mg/dL — ABNORMAL HIGH (ref 65–99)
HCT: 53 % — ABNORMAL HIGH (ref 39.0–52.0)
Hemoglobin: 18 g/dL — ABNORMAL HIGH (ref 13.0–17.0)
Potassium: 2.6 mmol/L — CL (ref 3.5–5.1)
SODIUM: 134 mmol/L — AB (ref 135–145)
TCO2: 26 mmol/L (ref 0–100)

## 2015-09-29 LAB — BASIC METABOLIC PANEL
Anion gap: 7 (ref 5–15)
BUN: 7 mg/dL (ref 6–20)
CALCIUM: 8.9 mg/dL (ref 8.9–10.3)
CO2: 26 mmol/L (ref 22–32)
CREATININE: 1.32 mg/dL — AB (ref 0.61–1.24)
Chloride: 103 mmol/L (ref 101–111)
GFR calc non Af Amer: >60 mL/min (ref >60)
Glucose, Bld: 103 mg/dL — ABNORMAL HIGH (ref 65–99)
Potassium: 4.2 mmol/L (ref 3.5–5.1)
SODIUM: 136 mmol/L (ref 135–145)

## 2015-09-29 LAB — TROPONIN I: Troponin I: 5.64 ng/mL (ref <0.03)

## 2015-09-29 MED ORDER — ATORVASTATIN CALCIUM 80 MG PO TABS
80.0000 mg | ORAL_TABLET | Freq: Every day | ORAL | Status: DC
  Administered 2015-09-29: 80 mg via ORAL
  Filled 2015-09-29: qty 1

## 2015-09-29 NOTE — Progress Notes (Signed)
CARDIAC REHAB PHASE I   PRE:  Rate/Rhythm: 82 SR  BP:  Supine: 118/70  Sitting:   Standing:    SaO2:   MODE:  Ambulation: 550 ft   POST:  Rate/Rhythm: 79 SR  BP:  Supine:   Sitting: 118/80  Standing:    SaO2:  0930-0950 Pt walked 550 ft with steady gait. No CP. Tolerated well. Re enforced importance of taking brilinta with stent. Pt knew name and that he was to take twice a day 12 hours a part. Gave me financial aid form to return to [facility].   Luetta Nuttingharlene Carlette Palmatier, RN BSN  09/29/2015 9:49 AM

## 2015-09-29 NOTE — Progress Notes (Addendum)
40yoM with hx of HTN, HLD, early family hx of CAD, smoking, polysubstance abuse, known CAD s/p multiple stents,  homelessness, bipolar disorder, schizoaffective disorder, presents with acute onset severe chest pain with ECG showing inferior STEMI with reciprocal depressions in I and AVL.  He ran out of brillenta two weeks ago   Subjective: No pain   Objective: Vital signs in last 24 hours: Temp:  [98.2 F (36.8 C)-98.5 F (36.9 C)] 98.4 F (36.9 C) (09/18 1318) Pulse Rate:  [59-65] 62 (09/18 1318) Resp:  [16-18] 18 (09/18 1318) BP: (104-117)/(66-79) 107/72 (09/18 1318) SpO2:  [100 %] 100 % (09/18 1318) Weight:  [126 lb 5.2 oz (57.3 kg)] 126 lb 5.2 oz (57.3 kg) (09/18 0500) Weight change:  Last BM Date: 09/27/15 Intake/Output from previous day: 09/17 0701 - 09/18 0700 In: 720 [P.O.:720] Out: 300 [Urine:300] Intake/Output this shift: No intake/output data recorded.  PE: as per Dr. Tresa Endo General:Pleasant affect, NAD Skin:Warm and dry, brisk capillary refill HEENT:normocephalic, sclera clear, mucus membranes moist Neck:supple, no JVD, no bruits  Heart:S1S2 RRR without murmur, gallup, rub or click Lungs:clear without rales, rhonchi, or wheezes UEA:VWUJ, non tender, + BS, do not palpate liver spleen or masses Ext:no lower ext edema, 2+ pedal pulses, 2+ radial pulses Neuro:alert and oriented, MAE, follows commands, + facial symmetry   Lab Results:  Recent Labs  09/27/15 0159 09/27/15 0301 09/27/15 0844  WBC 7.2 8.9  --   HGB 17.1* 16.5  --   HCT 49.8 47.4  --   PLT 194 224 221   BMET  Recent Labs  09/27/15 0301 09/28/15 0841  NA 135 135  K 2.7* 4.0  CL 95* 103  CO2 29 27  GLUCOSE 128* 142*  BUN 7 9  CREATININE 1.35* 1.39*  CALCIUM 8.8* 8.8*    Recent Labs  09/27/15 0159  TROPONINI 0.03*    Lab Results  Component Value Date   CHOL 226 (H) 06/11/2015   HDL 50 06/11/2015   LDLCALC 161 (H) 06/11/2015   TRIG 74 06/11/2015   CHOLHDL 4.5  06/11/2015   Lab Results  Component Value Date   HGBA1C 5.3 06/11/2015     Lab Results  Component Value Date   TSH 0.730 03/08/2015    Hepatic Function Panel No results for input(s): PROT, ALBUMIN, AST, ALT, ALKPHOS, BILITOT, BILIDIR, IBILI in the last 72 hours. No results for input(s): CHOL in the last 72 hours. No results for input(s): PROTIME in the last 72 hours.     Studies/Results: Cardiac cath 09/27/15 Conclusion     Prox RCA stent is patent.  Ost 1st Diag to 1st Diag lesion, 75 %stenosed.  Nononstructive disease in the LAD and circumflex.  Mid RCA to Dist RCA lesion, 100 %stenosed. Post intervention with aspiration thrombectomy and PTCA with a 3.5 [place] balloon, there is a 0% residual stenosis.  LV end diastolic pressure is normal.  There is no aortic valve stenosis.  He had right radial artery spasm treated with IA NTG.   Restart DAPT.  Will have to find a way for him to have Brilinta or Plavix long term.  Would like to keep him on Plavix long term given this episode.  IVUS not performed due to small distal vessel, beyond the stented segment.  Continue Angiomax  and IV tirofiban for a few hours.  Check echocardiogram.     ECHO: History:   PMH:   Coronary artery disease.  Risk factors:  Current tobacco  use. Hypertension.  ------------------------------------------------------------------- Study Conclusions  - Left ventricle: The cavity size was normal. Wall thickness was   normal. Systolic function was mildly to moderately reduced. The   estimated ejection fraction was in the range of 40% to 45%.   Severe hypokinesis and scarring of the basal-midinferolateral,   inferior, and inferoseptal myocardium; consistent with infarction   in the distribution of the right coronary artery. Left   ventricular diastolic function parameters were normal. - Aortic valve: There was trivial regurgitation.   Medications: I have reviewed the patient's current  medications. Scheduled Meds: . ALPRAZolam  0.25 mg Oral Once  . ARIPiprazole  5 mg Oral BID  . aspirin  81 mg Oral Daily  . diphenhydrAMINE  50 mg Oral Q6H  . lisinopril  5 mg Oral Daily  . metoprolol succinate  25 mg Oral Daily  . multivitamin with minerals  1 tablet Oral Daily  . nicotine  14 mg Transdermal Daily  . pravastatin  40 mg Oral q1800  . sodium chloride flush  3 mL Intravenous Q12H  . ticagrelor  90 mg Oral BID   Continuous Infusions:  PRN Meds:.sodium chloride, sodium chloride, acetaminophen, nitroGLYCERIN, ondansetron (ZOFRAN) IV, sodium chloride flush, traZODone  Assessment/Plan: Principal Problem:   ST elevation myocardial infarction involving right coronary artery (HCC) Active Problems:   Hyperlipidemia with target LDL less than 70   Bipolar 1 disorder, depressed (HCC)   Tobacco abuse   Anxiety   Medical non-compliance   Hypertensive heart disease   Malnutrition of moderate degree  1. S/P inferior wall STEMI/acute stent thrombosis: clinically well, repeat ECG with deep T wave inversions inf leads. Transfered to telemetry 09/28/15. Need to find a sure way for him to receive dual antiplatelet therapy for 12 months.   2. CAD: premature onset, high risk for progression to severe cardiomyopathy multiple interventions.  3. LV systolic dysfunction:  So far without clinical HF. Carvedilol and ACEi preferred, but relatively low BP limits doses. EF 44%  4. Hypokalemia: severe, replaced now at 4.0 yesterday. Will recheck today .  5. Acute renal insufficiency:  Showing slow improvement, recheck today. Recheck today   6. Hyperlipidemia: LDL 160, statin prescribed, probably not taking. Target LDL <70. From 05/2015  7. Medication noncompliance:  Partly clearly due to social issues (homelessness), but he demonstrates a good degree of nonchalance about his medications. "Oh, I didn't realize I still had 3 weeks of Brilinta left". Dr. Tresa Endo has given him samples whenever  possible.  8. Substance abuse: UDS showed benzos and THC, but also amphetamines. High risk of serious and possibly fatal complications.    9. Psychiatric disorder:  Has a diagnosis of schizoaffective disorder and bipolar disorder. He seems well compensated right now. Probably interconnected with his chemical abuse and social issues. Needs social work support, care management involvement (maybe [facility] ?) and psych follow up.   LOS: 2 days   Time spent with pt. : 15 minutes. Nada Boozer  Nurse Practitioner Certified Pager [phone] or after 5pm and on weekends call [phone] 09/29/2015, 1:27 PM  Patient seen and examined. Agree with assessment and plan. I had a long discussion with patient concerning medication compliance. No recurrent chest pain. Will f/u troponin today. He smokes marijuana 2 x week and has reduced tobacco use to 6 cigarettes/day.  Will need to dc altogether.  Now on low dose ACE-I and BB. Change pravastatin to atorvastatin 80 mg daily. Plan ECG in am; probably dc tomorrow.  Lennette Bihari,  MD, [facility] 09/29/2015 1:43 PM

## 2015-09-30 ENCOUNTER — Telehealth: Payer: Self-pay | Admitting: Physician Assistant

## 2015-09-30 LAB — BASIC METABOLIC PANEL
ANION GAP: 9 (ref 5–15)
BUN: 5 mg/dL — ABNORMAL LOW (ref 6–20)
CALCIUM: 8.9 mg/dL (ref 8.9–10.3)
CO2: 22 mmol/L (ref 22–32)
Chloride: 104 mmol/L (ref 101–111)
Creatinine, Ser: 1.2 mg/dL (ref 0.61–1.24)
GFR calc Af Amer: >60 mL/min (ref >60)
GFR calc non Af Amer: >60 mL/min (ref >60)
GLUCOSE: 98 mg/dL (ref 65–99)
Potassium: 3.8 mmol/L (ref 3.5–5.1)
Sodium: 135 mmol/L (ref 135–145)

## 2015-09-30 LAB — TROPONIN I: Troponin I: 4.31 ng/mL (ref <0.03)

## 2015-09-30 MED ORDER — ADULT MULTIVITAMIN W/MINERALS CH: 1.0000 | ORAL_TABLET | Freq: Every day | ORAL | Status: DC

## 2015-09-30 MED ORDER — TICAGRELOR 90 MG PO TABS: 90.0000 mg | ORAL_TABLET | Freq: Two times a day (BID) | ORAL | 11 refills | Status: DC

## 2015-09-30 MED ORDER — ATORVASTATIN CALCIUM 80 MG PO TABS: 80.0000 mg | ORAL_TABLET | Freq: Every day | ORAL | 6 refills | Status: DC

## 2015-09-30 MED ORDER — DIPHENHYDRAMINE HCL 50 MG PO CAPS: 50.0000 mg | ORAL_CAPSULE | Freq: Four times a day (QID) | ORAL | 0 refills | Status: DC

## 2015-09-30 MED ORDER — NICOTINE 14 MG/24HR TD PT24: 14.0000 mg | MEDICATED_PATCH | Freq: Every day | TRANSDERMAL | 0 refills | Status: DC

## 2015-09-30 NOTE — Discharge Instructions (Signed)
°  Call [facility] at [phone] if any bleeding, swelling or drainage at cath site.  May shower, no tub baths for 48 hours for groin sticks. No lifting over 5 pounds for 10 days.  No Driving for 5 days.    Do not stop Brilinta or asprin , stopping could cause another heart attack.    Heart Healthy diet.    Do not wear nicoderm patch and smoke, this could cause a heart attack.    Bring you meds with you to appointment

## 2015-09-30 NOTE — Discharge Summary (Signed)
Physician Discharge Summary       Patient ID: [name] MRN: [id number] DOB/AGE: 02-11-1975 40 y.o.  Admit date: 09/27/2015 Discharge date: 09/30/2015 Primary Cardiologist:Dr. Tresa Endo   Discharge Diagnoses:  Principal Problem:   ST elevation myocardial infarction involving right coronary artery [facility]) Active Problems:   Hyperlipidemia with target LDL less than 70   Bipolar 1 disorder, depressed (HCC)   Tobacco abuse   Anxiety   Medical non-compliance   Hypertensive heart disease   Malnutrition of moderate degree   Discharged Condition: good  Procedures: by Dr. Eldridge Dace 09/27/15  Procedures   Coronary Balloon Angioplasty  Left Heart Cath and Coronary Angiography  Conclusion     Prox RCA stent is patent.  Ost 1st Diag to 1st Diag lesion, 75 %stenosed.  Nononstructive disease in the LAD and circumflex.  Mid RCA to Dist RCA lesion, 100 %stenosed. Post intervention with aspiration thrombectomy and PTCA with a 3.5 [place] balloon, there is a 0% residual stenosis.  LV end diastolic pressure is normal.  There is no aortic valve stenosis.  He had right radial artery spasm treated with IA NTG.   Restart DAPT.  Will have to find a way for him to have Brilinta or Plavix long term.  Would like to keep him on Plavix long term given this episode.  IVUS not performed due to small distal vessel, beyond the stented segment.  Continue Angiomax  and IV tirofiban for a few hours.  Check echocardiogram.  Will also need assistance with his psychiatric meds.   Indications   Acute ST elevation myocardial infarction (STEMI) involving right coronary artery (HCC) [I21.11 (ICD-10-CM)]   Echo: Study Conclusions  - Left ventricle: The cavity size was normal. Wall thickness was   normal. Systolic function was mildly to moderately reduced. The   estimated ejection fraction was in the range of 40% to 45%.   Severe hypokinesis and scarring of the basal-midinferolateral,   inferior,  and inferoseptal myocardium; consistent with infarction   in the distribution of the right coronary artery. Left   ventricular diastolic function parameters were normal. - Aortic valve: There was trivial regurgitation.  Hospital Course:   40yoM with hx of HTN, HLD, early family hx of CAD, smoking, polysubstance abuse, known CAD s/p multiple stents,  homelessness, bipolar disorder, schizoaffective disorder, presents with acute onset severe chest pain with ECG showing inferior STEMI with reciprocal depressions in I and AVL.  Symptoms haD been sputtering for the past week PRIOR TO ADMIT 09/27/15.  He ran out of brillenta two weeks ago.  1 hour prior to admission pain came on suddenly and strongly 8/10.  Pain is associated with nausea, diaphoresis.  Says he has run out of all his medications except for Aspirin.  Pt is no homeless,as first thought and has a place to stay.  Care manager discussed with him as well.  He does have Brilinta at home and will need samples from [place] on visit.  He has ambulated with cardiac rehab without problems.  His troponin is trending down.  VS stable.  Mild LV dysfunction and he is on ACE.  On BB and statin. He understands stopping tobacco would be important.  Will send script for nicoderm patch.       Seen and evaluated by Dr. Tresa Endo and found stable for discharge.  Will arrange for close follow up 5-7 days.    Consults: SOCIAL WORK AND CARE MANAGER  Significant Diagnostic Studies:  BMP Latest Ref Rng & Units 09/30/2015  09/29/2015 09/28/2015  Glucose 65 - 99 mg/dL 98 161(W103(H) 960(A142(H)  BUN 6 - 20 mg/dL 5(L) 7 9  Creatinine 5.400.61 - 1.24 mg/dL 9.811.20 1.91(Y1.32(H) 7.82(N1.39(H)  Sodium 135 - 145 mmol/L 135 136 135  Potassium 3.5 - 5.1 mmol/L 3.8 4.2 4.0  Chloride 101 - 111 mmol/L 104 103 103  CO2 22 - 32 mmol/L 22 26 27   Calcium 8.9 - 10.3 mg/dL 8.9 8.9 5.6(O8.8(L)   CBC Latest Ref Rng & Units 09/27/2015 09/27/2015 09/27/2015  WBC 4.0 - 10.5 K/uL - 8.9 7.2  Hemoglobin 13.0 - 17.0 g/dL - 13.016.5  17.1(H)  Hematocrit 39.0 - 52.0 % - 47.4 49.8  Platelets 150 - 400 K/uL 221 224 194   TROPONIN PK 5.64,  4.31   DRUG SCREEN + AMPHETAMINES, BENZO, TETRAHYDROCANNABINOL    PORTABLE CHEST 1 VIEW COMPARISON:  07/21/2013 chest radiograph. FINDINGS: The heart size and mediastinal contours are within normal limits. Both lungs are clear. The visualized skeletal structures are unremarkable. IMPRESSION: No active disease.  Discharge Exam: Blood pressure 113/83, pulse 68, temperature 98 F (36.7 C), temperature source Oral, resp. rate 18, height 6' (1.829 m), weight 125 lb (56.7 kg), SpO2 100 %. General:Pleasant affect, NAD Skin:Warm and dry, brisk capillary refill HEENT:normocephalic, sclera clear, mucus membranes moist Neck:supple, no JVD  Heart:S1S2 RRR without murmur, gallup, rub or click Lungs:clear without rales, rhonchi, or wheezes QMV:HQIOAbd:soft, non tender, + BS, do not palpate liver spleen or masses Ext:no lower ext edema, 2+ pedal pulses, 2+ radial pulses Neuro:alert and oriented x 3, MAE, follows commands, + facial symmetry TELE  SR   Disposition: 01-Home or Self Care  Discharge Instructions    Amb Referral to Cardiac Rehabilitation    Complete by:  As directed    Diagnosis:   STEMI PTCA         Medication List    STOP taking these medications   benztropine 0.5 MG tablet Commonly known as:  COGENTIN   nicotine 21 mg/24hr patch Commonly known as:  NICODERM CQ - dosed in mg/24 hours Replaced by:  nicotine 14 mg/24hr patch   pravastatin 40 MG tablet Commonly known as:  PRAVACHOL     TAKE these medications   ARIPiprazole 5 MG tablet Commonly known as:  ABILIFY Take one tab (5 mg) in the morning.  Then tale 1 tab (5 mg) in the evening. What changed:  Another medication with the same name was removed. Continue taking this medication, and follow the directions you see here.   aspirin 81 MG EC tablet Take 1 tablet (81 mg total) by mouth daily.   atorvastatin 80  MG tablet Commonly known as:  LIPITOR Take 1 tablet (80 mg total) by mouth daily at 6 PM.   diphenhydrAMINE 50 MG capsule Commonly known as:  BENADRYL Take 1 capsule (50 mg total) by mouth every 6 (six) hours.   lisinopril 5 MG tablet Commonly known as:  PRINIVIL,ZESTRIL Take 1 tablet (5 mg total) by mouth daily.   metoprolol succinate 25 MG 24 hr tablet Commonly known as:  TOPROL-XL Take 1 tablet (25 mg total) by mouth daily.   multivitamin with minerals Tabs tablet Take 1 tablet by mouth daily. Start taking on:  10/01/2015   nicotine 14 mg/24hr patch Commonly known as:  NICODERM CQ - dosed in mg/24 hours Place 1 patch (14 mg total) onto the skin daily. Start taking on:  10/01/2015 Replaces:  nicotine 21 mg/24hr patch   nitroGLYCERIN 0.4 MG SL tablet Commonly known  as:  NITROSTAT Place 1 tablet (0.4 mg total) under the tongue every 5 (five) minutes x 3 doses as needed for chest pain.   ticagrelor 90 MG Tabs tablet Commonly known as:  BRILINTA Take 1 tablet (90 mg total) by mouth 2 (two) times daily.   traZODone 100 MG tablet Commonly known as:  DESYREL Take 1 tablet (100 mg total) by mouth at bedtime as needed for sleep. What changed:  when to take this      Follow-up Information    [facility]. Go on 01/14/2016.   Specialty:  Internal Medicine Why:  6 mo f/u with Julianne Handler FNP-  at 1030 Contact information: [address] [place] [address] [phone]       Theodore Demark, PA-C Follow up on 10/07/2015.   Specialties:  Cardiology, Radiology Why:  at 3:00pm  Dr. Landry Dyke PA Contact information: [address] [place] [address] [phone]        Nicki Guadalajara, MD Follow up on 11/25/2015.   Specialty:  Cardiology Why:  at 11:15 AM   Contact information: [address] [place] [address] [phone]            Discharge Instructions: Call [facility] at  [phone] if any bleeding, swelling or drainage at cath site.  May shower, no tub baths for 48 hours for groin sticks. No lifting over 5 pounds for 10 days.  No Driving for 5 days.    Do not stop Brilinta or asprin , stopping could cause another heart attack.    Heart Healthy diet.    Do not wear nicoderm patch and smoke, this could cause a heart attack.    Bring you meds with you to appointment  Signed: Nada Boozer Nurse Practitioner-Certified [facility]: [facility] 09/30/2015, 1:40 PM  Time spent on discharge : > 30 minutes.    Patient seen and examined. Agree with assessment and plan. Feels well; Asymptomatic; walked 890 feet without chest pain or dyspnea. ECG with peaked T wave in V3-4 and IRBBB. Discussed the importance of medication compliance and OV f/u compliance.    Lennette Bihari, MD, [employer] 09/30/2015 1:45 PM

## 2015-09-30 NOTE — Progress Notes (Signed)
CARDIAC REHAB PHASE I   PRE:  Rate/Rhythm: 66 SR  BP:  Supine:   Sitting: 118/70  Standing:    SaO2:   MODE:  Ambulation: 890 ft   POST:  Rate/Rhythm: 78 SR  BP:  Supine:   Sitting: 120/84  Standing:    SaO2:  1120-1135 Pt walked 890 ft with steady gait. No CP. Knows he is to not miss brilinta. Case manager working with pt.   Luetta Nuttingharlene Macari Zalesky, RN BSN  09/30/2015 11:32 AM

## 2015-09-30 NOTE — Telephone Encounter (Signed)
New message       TCM appt on 10-07-15 per Nada BoozerLaura Ingold

## 2015-09-30 NOTE — Care Management Note (Signed)
Case Management Note Donn PieriniKristi Blakleigh Straw RN, BSN Unit 2W-Case Manager [phone]  Patient Details  Name: [name] MRN: [id number] Date of Birth: 1976/01/09  Subjective/Objective:    Pt admitted with ST elevaton MI                Action/Plan: PTA pt staying with girlfriend- per conversation with pt he states that he has a safe place to stay and does not need any resources for homeless shelters. Pt reports that he has an appointment for disability this week and plans to go by DSS to inquire about his medicaid- on his last admit - CM had spoken with pt regarding his Medicaid and pt had not f/u with DSS regarding missing paperwork to re-file for his medicaid. He had been in the Pt connect program with Brilinta and had been receiving medications but did not follow up with needed paperwork to renew the assistance- pt has number for pt assistance with [employer] and a new application given to pt to assist in reapplying for assistance- per pt he has 3 weeks worth of Brilinta at home. Pt also plans [facility] for further medication needs and psych follow up.  Pt is not eligible for MATCH due to last used  06/14/15.  Expected Discharge Date:     09/30/15             Expected Discharge Plan:  Home/Self Care  In-House Referral:     Discharge planning Services  CM Consult, Medication Assistance, Indigent Health Clinic  Post Acute Care Choice:    Choice offered to:     DME Arranged:    DME Agency:     HH Arranged:    HH Agency:     Status of Service:  Completed, signed off  If discussed at [employer] Meetings, dates discussed:    Additional Comments:  Darrold SpanWebster, Adison Reifsteck Hall, RN 09/30/2015, 10:05 AM

## 2015-09-30 NOTE — Progress Notes (Signed)
Pt given d/c instructions. Strongly encouraged to keep all f/u appointments and take all meds as directed. IV and tele removed. Pt states he had no questions. Seen leaving the unit ambulating.

## 2015-10-01 NOTE — Telephone Encounter (Signed)
Patient contacted [facility] on [facility].  Patient understands to follow up with provider Theodore Demarkhonda Barrett on 10/07/15 at 3pm at [facility]. Patient understands discharge instructions? Yes Patient understands medications and regiment? Yes Patient understands to bring all medications to this visit? Yes  Confirmed appt. Pt denies concerns or questions at this time. Recommended to call if he has any needs prior to appt.

## 2015-10-06 ENCOUNTER — Inpatient Hospital Stay: Payer: Self-pay | Admitting: Family Medicine

## 2015-10-07 ENCOUNTER — Encounter: Payer: Self-pay | Admitting: Physician Assistant

## 2015-10-07 ENCOUNTER — Ambulatory Visit (INDEPENDENT_AMBULATORY_CARE_PROVIDER_SITE_OTHER): Payer: Self-pay | Admitting: Physician Assistant

## 2015-10-07 VITALS — BP 120/80 | HR 74 | Ht 72.0 in | Wt 138.0 lb

## 2015-10-07 DIAGNOSIS — I2111 ST elevation (STEMI) myocardial infarction involving right coronary artery: Secondary | ICD-10-CM

## 2015-10-07 DIAGNOSIS — I10 Essential (primary) hypertension: Secondary | ICD-10-CM

## 2015-10-07 NOTE — Patient Instructions (Signed)
Medications:  Do not start nicotine patches while still smoking.  Have all other medicines filled.--Call us if you experience any side affects.  Change Benadryl to every 6 hours as needed.   Follow-Up:  Your physician recommends that you schedule a follow-up appointment in: 3 months with Theodore Demarkhonda Barrett, PA-C.

## 2015-10-07 NOTE — Progress Notes (Signed)
Cardiology Office Note   Date:  10/07/2015   ID:  ADRIENNE DELAY, DOB April 25, 1975, MRN [id number]  PCP:  Massie Maroon, FNP  Cardiologist:  Dr Venora Maples, PA-C   Chief Complaint  Patient presents with  . Follow-up    post hospital     History of Present Illness: MELDON HANZLIK is a 40 y.o. male with a history of  HTN, HLD, early family hx of CAD, smoking, polysubstance abuse, known CAD s/p multiple stents, homelessness, bipolar disorder, schizoaffective disorder  D/c 09/19 after admit for STEMI, s/p PTCA w/ asp thrombectomy to RCA, 100%>0%, D1 75%, EF 40-45% by echo, LDL 161  Bhavin J Mauss presents for Post hospital follow-up.  Since discharge from the hospital, Mr. Hyer has not started taking the majority of his medications. He is on the Brilinta, that is all. He has gotten information on the [facility]. Can help him get his medications and he assures me he will follow-up with them. The importance of taking his medications and especially the Brilinta was emphasized.  Since discharge from the hospital, he has not had chest pain or shortness of breath. He has been increasing his activity successfully. He admits that he is still smoking, but agrees to try to quit. He is currently living with his girlfriend and so is not homeless, which was a significant concern.  He still has some pain in his right forearm. It started after the procedure, and has been going on since then. He feels that it runs up his arm, it is worse when someone touches it.   Past Medical History:  Diagnosis Date  . Anxiety   . Bipolar 1 disorder (HCC)   . CAD S/P percutaneous coronary angioplasty    a. Inf STEMI s/p BMS to RCA 01/2007. b. NSTEMI 07/2013 s/p PTCA to RCA for ISR; c. Transient inferior ST elevation (peak Ti 0.25) 01/2014 s/p PTCA/DES to pRCA, PTCA/DES to dRCA, EF 50%; d. 12/2014 Inf STEMI: RCA patent prox stent, 154m/d (3.0x32 Synergy DES), EF 35-45; e. 05/2015 Inf  STEMI/Cath: LM nl, LAD 10ost/m, D1 75, LCX nl, OM1 25, OM2 30, RCA patent stents, RPDA 30ost.  . Hx of medication noncompliance   . Hyperlipidemia   . Hypertensive heart disease   . Ischemic cardiomyopathy    a. EF 40% in 2011, 60% in 2012. b. EF 55% by cath 07/2013. c. EF 50% by cath 01/2014; d. 12/2014 EF 35-45% by LV gram; e. 05/2015 Echo: EF 50-55% inflat/inf AK, mild AI; f. 06/2015 cMRI: EF 49%, basal & mid inf full thickness scar, subendocardial scar in antsept and antlat wall, correlating w/ D1 dzs.  Marland Kitchen NSVT (nonsustained ventricular tachycardia) (HCC)    a. Very brief run during 07/2013 admit for NSTEMI felt due to MI.  . Polysubstance abuse    a. h/o tobacco, marijuana and crack cocaine use. b. 07/2013: +UDS THC, neg for cocaine.  . Tobacco abuse     Past Surgical History:  Procedure Laterality Date  . CARDIAC CATHETERIZATION  01/16/2009   normal left main, Cfx with 2-OMs both w/minor irregularities, LAd with 20-30% mid region irregularities, ramus intermediate/optional diagonal with 60% osital narrowing, RCA with stent in distal portion w/20% prox in-stent stenosis (Dr. Mervyn Skeeters. Little)  . CARDIAC CATHETERIZATION  07/23/2013   two vessel obstructive CAD, occluded first diagonal, focal in-stent restenosis in distal RCA (Dr. Peter Swaziland)  . CARDIAC CATHETERIZATION N/A 01/10/2015   Procedure: Left Heart Cath and Coronary  Angiography;  Surgeon: Marykay Lex, MD;  Location: [facility] INVASIVE CV LAB;  Service: Cardiovascular;  Laterality: N/A;  . CARDIAC CATHETERIZATION N/A 01/10/2015   Procedure: Coronary Stent Intervention;  Surgeon: Marykay Lex, MD;  Location: [facility] INVASIVE CV LAB;  Service: Cardiovascular;  Laterality: N/A;  . CARDIAC CATHETERIZATION N/A 06/10/2015   Procedure: Left Heart Cath and Coronary Angiography;  Surgeon: Peter M Swaziland, MD;  Location: [facility] INVASIVE CV LAB;  Service: Cardiovascular;  Laterality: N/A;  . CARDIAC CATHETERIZATION N/A 09/27/2015   Procedure: Left Heart Cath and  Coronary Angiography;  Surgeon: Corky Crafts, MD;  Location: [facility] INVASIVE CV LAB;  Service: Cardiovascular;  Laterality: N/A;  . CARDIAC CATHETERIZATION N/A 09/27/2015   Procedure: Coronary Balloon Angioplasty;  Surgeon: Corky Crafts, MD;  Location: [facility] INVASIVE CV LAB;  Service: Cardiovascular;  Laterality: N/A;  . CORONARY ANGIOPLASTY WITH STENT PLACEMENT  02/05/2007   3.5x82mm Quantum non-DES to RCA (Dr. Nicki Guadalajara)  . FEMORAL ARTERY STENT    . LEFT AND RIGHT HEART CATHETERIZATION WITH CORONARY ANGIOGRAM N/A 01/14/2014   Procedure: LEFT AND RIGHT HEART CATHETERIZATION WITH CORONARY ANGIOGRAM;  Surgeon: Kathleene Hazel, MD;  Location: [facility] CATH LAB;  Service: Cardiovascular;  Laterality: N/A;  . LEFT HEART CATHETERIZATION WITH CORONARY ANGIOGRAM N/A 07/23/2013   Procedure: LEFT HEART CATHETERIZATION WITH CORONARY ANGIOGRAM;  Surgeon: Peter M Swaziland, MD;  Location: [facility] CATH LAB;  Service: Cardiovascular;  Laterality: N/A;    Current Outpatient Prescriptions  Medication Sig Dispense Refill  . ticagrelor (BRILINTA) 90 MG TABS tablet Take 1 tablet (90 mg total) by mouth 2 (two) times daily. 60 tablet 11   No current facility-administered medications for this visit.     Allergies:   Iodine; Shellfish allergy; and Contrast media [iodinated diagnostic agents]    Social History:  The patient  reports that he has been smoking Cigarettes.  He has a 7.50 pack-year smoking history. He has never used smokeless tobacco. He reports that he drinks alcohol. He reports that he does not use drugs.   Family History:  The patient's family history includes CAD in his mother and sister; Heart attack in his mother; Heart disease in his mother; Schizophrenia in his mother.    ROS:  Please see the history of present illness. All other systems are reviewed and negative.    PHYSICAL EXAM: VS:  BP 120/80 (BP Location: Right Arm, Patient Position: Sitting, Cuff Size: Normal)   Pulse 74   Ht 6'  (1.829 m)   Wt 138 lb (62.6 kg)   SpO2 99%   BMI 18.72 kg/m  , BMI Body mass index is 18.72 kg/m. GEN: Well nourished, well developed, male in no acute distress  HEENT: normal for age  Neck: no JVD, no carotid bruit, no masses Cardiac: RRR; no significant murmur, no rubs, or gallops Respiratory:  clear to auscultation bilaterally, normal work of breathing GI: soft, nontender, nondistended, + BS MS: no deformity or atrophy; no edema; distal pulses are 2+ in all 4 extremities; mild tenderness in the right radial but no deformity, hematoma or other abnormality is noted   Skin: warm and dry, no rash Neuro:  Strength and sensation are intact Psych: euthymic mood, full affect   EKG:  EKG is ordered today. The ekg ordered today demonstrates sinus rhythm, rate 78, inferior Q waves and T-wave inversions consistent with recent MI noted.   Recent Labs: 03/08/2015: TSH 0.730 06/01/2015: ALT 20 09/27/2015: Hemoglobin 16.5; Magnesium 2.0; Platelets 221 09/30/2015:  BUN 5; Creatinine, Ser 1.20; Potassium 3.8; Sodium 135    Lipid Panel    Component Value Date/Time   CHOL 226 (H) 06/11/2015 0311   TRIG 74 06/11/2015 0311   HDL 50 06/11/2015 0311   CHOLHDL 4.5 06/11/2015 0311   VLDL 15 06/11/2015 0311   LDLCALC 161 (H) 06/11/2015 0311     Wt Readings from Last 3 Encounters:  10/07/15 138 lb (62.6 kg)  09/30/15 125 lb (56.7 kg)  07/07/15 133 lb (60.3 kg)     Other studies Reviewed: Additional studies/ records that were reviewed today include: Hospital records and testing.  ASSESSMENT AND PLAN:  1.  Inferior STEMI: He had PTCA and thrombectomy to the RCA. Medical therapy for nonobstructive disease in the D1 at 75% is recommended. His EF is 40-45 percent. He is to continue current therapy with Brilinta. He is encouraged to get other medications filled including aspirin, Lipitor, lisinopril, metoprolol, and sublingual nitroglycerin. He promises he will let us know if he is going to have  trouble filling the Brilinta once his 30 day card runs out.  The importance of compliance with Brilinta and his other medications was emphasized. He is encouraged to follow-up with his primary care as soon as possible to get his prescriptions filled.   Current medicines are reviewed at length with the patient today.  The patient does not have concerns regarding medicines.  The following changes have been made:  no change  Labs/ tests ordered today include:  No orders of the defined types were placed in this encounter.    Disposition:   FU with Dr. Nicholaus BloomKelley or myself in 3 months  Signed, Leanna BattlesBarrett, Mathew Postiglione, PA-C  10/07/2015 2:58 PM    [facility] Phone: [phone]; Fax: [phone]  This note was written with the assistance of speech recognition software. Please excuse any transcriptional errors.

## 2015-11-08 ENCOUNTER — Emergency Department (HOSPITAL_COMMUNITY)
Admission: EM | Admit: 2015-11-08 | Discharge: 2015-11-08 | Disposition: A | Discharge status: Home / Self Care | Payer: Medicaid Other | Attending: Emergency Medicine | Admitting: Emergency Medicine

## 2015-11-08 ENCOUNTER — Emergency Department (HOSPITAL_COMMUNITY): Payer: Medicaid Other

## 2015-11-08 ENCOUNTER — Encounter (HOSPITAL_COMMUNITY): Payer: Self-pay | Admitting: Emergency Medicine

## 2015-11-08 DIAGNOSIS — Z7982 Long term (current) use of aspirin: Secondary | ICD-10-CM | POA: Diagnosis not present

## 2015-11-08 DIAGNOSIS — R112 Nausea with vomiting, unspecified: Secondary | ICD-10-CM

## 2015-11-08 DIAGNOSIS — R197 Diarrhea, unspecified: Secondary | ICD-10-CM | POA: Insufficient documentation

## 2015-11-08 DIAGNOSIS — F17210 Nicotine dependence, cigarettes, uncomplicated: Secondary | ICD-10-CM | POA: Insufficient documentation

## 2015-11-08 DIAGNOSIS — R109 Unspecified abdominal pain: Secondary | ICD-10-CM | POA: Diagnosis present

## 2015-11-08 DIAGNOSIS — I2510 Atherosclerotic heart disease of native coronary artery without angina pectoris: Secondary | ICD-10-CM | POA: Diagnosis not present

## 2015-11-08 DIAGNOSIS — Z79899 Other long term (current) drug therapy: Secondary | ICD-10-CM | POA: Insufficient documentation

## 2015-11-08 DIAGNOSIS — R52 Pain, unspecified: Secondary | ICD-10-CM

## 2015-11-08 LAB — CBC
HCT: 44.4 % (ref 39.0–52.0)
Hemoglobin: 15.5 g/dL (ref 13.0–17.0)
MCH: 31.1 pg (ref 26.0–34.0)
MCHC: 34.9 g/dL (ref 30.0–36.0)
MCV: 89.2 fL (ref 78.0–100.0)
PLATELETS: 297 10*3/uL (ref 150–400)
RBC: 4.98 MIL/uL (ref 4.22–5.81)
RDW: 14.2 % (ref 11.5–15.5)
WBC: 5.3 10*3/uL (ref 4.0–10.5)

## 2015-11-08 LAB — COMPREHENSIVE METABOLIC PANEL
ALT: 17 U/L (ref 17–63)
AST: 20 U/L (ref 15–41)
Albumin: 3.6 g/dL (ref 3.5–5.0)
Alkaline Phosphatase: 60 U/L (ref 38–126)
Anion gap: 13 (ref 5–15)
BUN: 9 mg/dL (ref 6–20)
CHLORIDE: 99 mmol/L — AB (ref 101–111)
CO2: 24 mmol/L (ref 22–32)
CREATININE: 1.17 mg/dL (ref 0.61–1.24)
Calcium: 9.2 mg/dL (ref 8.9–10.3)
GFR calc Af Amer: >60 mL/min (ref >60)
GFR calc non Af Amer: >60 mL/min (ref >60)
Glucose, Bld: 93 mg/dL (ref 65–99)
Potassium: 3.9 mmol/L (ref 3.5–5.1)
SODIUM: 136 mmol/L (ref 135–145)
Total Bilirubin: 1.3 mg/dL — ABNORMAL HIGH (ref 0.3–1.2)
Total Protein: 6.4 g/dL — ABNORMAL LOW (ref 6.5–8.1)

## 2015-11-08 LAB — URINALYSIS, ROUTINE W REFLEX MICROSCOPIC
GLUCOSE, UA: NEGATIVE mg/dL
HGB URINE DIPSTICK: NEGATIVE
KETONES UR: 40 mg/dL — AB
Leukocytes, UA: NEGATIVE
Nitrite: NEGATIVE
PROTEIN: 30 mg/dL — AB
Specific Gravity, Urine: 1.042 — ABNORMAL HIGH (ref 1.005–1.030)
pH: 6 (ref 5.0–8.0)

## 2015-11-08 LAB — URINE MICROSCOPIC-ADD ON

## 2015-11-08 LAB — LIPASE, BLOOD: LIPASE: 16 U/L (ref 11–51)

## 2015-11-08 LAB — I-STAT TROPONIN, ED: Troponin i, poc: 0.01 ng/mL (ref 0.00–0.08)

## 2015-11-08 MED ORDER — PROMETHAZINE HCL 25 MG PO TABS
25.0000 mg | ORAL_TABLET | Freq: Once | ORAL | Status: DC
  Filled 2015-11-08: qty 1

## 2015-11-08 MED ORDER — SODIUM CHLORIDE 0.9 % IV BOLUS (SEPSIS)
1000.0000 mL | Freq: Once | INTRAVENOUS | Status: AC
  Administered 2015-11-08: 1000 mL via INTRAVENOUS

## 2015-11-08 MED ORDER — ONDANSETRON HCL 4 MG/2ML IJ SOLN
4.0000 mg | Freq: Once | INTRAMUSCULAR | Status: AC | PRN
  Administered 2015-11-08: 4 mg via INTRAVENOUS
  Filled 2015-11-08: qty 2

## 2015-11-08 MED ORDER — PROMETHAZINE HCL 25 MG/ML IJ SOLN
25.0000 mg | Freq: Once | INTRAMUSCULAR | Status: AC
  Administered 2015-11-08: 25 mg via INTRAVENOUS
  Filled 2015-11-08: qty 1

## 2015-11-08 MED ORDER — PROMETHAZINE HCL 25 MG PO TABS: 25.0000 mg | ORAL_TABLET | Freq: Four times a day (QID) | ORAL | 0 refills | Status: DC | PRN

## 2015-11-08 MED ORDER — FAMOTIDINE 20 MG PO TABS: 20.0000 mg | ORAL_TABLET | Freq: Two times a day (BID) | ORAL | 0 refills | Status: DC

## 2015-11-08 MED ORDER — ONDANSETRON 8 MG PO TBDP
8.0000 mg | ORAL_TABLET | Freq: Once | ORAL | Status: AC
  Administered 2015-11-08: 8 mg via ORAL
  Filled 2015-11-08: qty 1

## 2015-11-08 MED ORDER — MORPHINE SULFATE (PF) 2 MG/ML IV SOLN
4.0000 mg | Freq: Once | INTRAVENOUS | Status: AC
  Administered 2015-11-08: 4 mg via INTRAVENOUS
  Filled 2015-11-08 (×2): qty 2

## 2015-11-08 MED ORDER — LOPERAMIDE HCL 2 MG PO CAPS: 2.0000 mg | ORAL_CAPSULE | Freq: Four times a day (QID) | ORAL | 0 refills | Status: DC | PRN

## 2015-11-08 NOTE — ED Provider Notes (Signed)
[facility]-EMERGENCY DEPT Provider Note   CSN: [id number] Arrival date & time: 11/08/15  1008     History   Chief Complaint Chief Complaint  Patient presents with  . Abdominal Pain  . Emesis  . Diarrhea    HPI [name] is a 40 y.o. male.  Patient is 40 yo M with PMH of anxiety, CAD with multiple STEMIs, hypertension, hyperlipidemia, but no chronic abdominal problems or history of abdominal surgeries, presenting with chief complaint of nausea, vomiting, and diarrhea starting on Wednesday. Also describes epigastric abdominal pain and states that his stomach has been "flipping" over the past week. Denies any chest pain, palpitations, shortness of breath, but recently developed a cough and chills. Denies hematemesis or blood in stools. States he has been unable to tolerate anything PO due to vomiting, and has not taken any of his daily medications over the past week. Took 2 mg of his girlfriend's Haldol and smoked marijuana on Wednesday for relief of vomiting, but it did not help. Occassional alcohol use and smokes cigarettes daily, but has not smoked or drank alcohol since Tuesday. Denies any other illegal drug use.      Past Medical History:  Diagnosis Date  . Anxiety   . Bipolar 1 disorder (HCC)   . CAD S/P percutaneous coronary angioplasty    a. Inf STEMI s/p BMS to RCA 01/2007. b. NSTEMI 07/2013 s/p PTCA to RCA for ISR; c. Transient inferior ST elevation (peak Ti 0.25) 01/2014 s/p PTCA/DES to pRCA, PTCA/DES to dRCA, EF 50%; d. 12/2014 Inf STEMI: RCA patent prox stent, [id number]/d (3.0x32 Synergy DES), EF 35-45; e. 05/2015 Inf STEMI/Cath: LM nl, LAD 10ost/m, D1 75, LCX nl, OM1 25, OM2 30, RCA patent stents, RPDA 30ost.  . Hx of medication noncompliance   . Hyperlipidemia   . Hypertensive heart disease   . Ischemic cardiomyopathy    a. EF 40% in 2011, 60% in 2012. b. EF 55% by cath 07/2013. c. EF 50% by cath 01/2014; d. 12/2014 EF 35-45% by LV gram; e. 05/2015 Echo: EF 50-55% inflat/inf AK,  mild AI; f. 06/2015 cMRI: EF 49%, basal & mid inf full thickness scar, subendocardial scar in antsept and antlat wall, correlating w/ D1 dzs.  Marland Kitchen. NSVT (nonsustained ventricular tachycardia) (HCC)    a. Very brief run during 07/2013 admit for NSTEMI felt due to MI.  . Polysubstance abuse    a. h/o tobacco, marijuana and crack cocaine use. b. 07/2013: +UDS THC, neg for cocaine.  . Tobacco abuse     Patient Active Problem List   Diagnosis Date Noted  . Malnutrition of moderate degree 09/28/2015  . Hyperlipidemia   . Schizoaffective disorder, bipolar type (HCC) 03/06/2015  . Nausea with vomiting 03/06/2015  . Hypertensive heart disease 01/11/2015  . ST elevation myocardial infarction (STEMI) of inferior wall (HCC) 01/10/2015  . ST elevation myocardial infarction (STEMI) of inferior wall, initial episode of care (HCC) 01/10/2015  . Medical non-compliance   . ST elevation myocardial infarction involving right coronary artery (HCC) 01/15/2014  . Hx of medication noncompliance   . NSVT (nonsustained ventricular tachycardia) (HCC)   . Ischemic cardiomyopathy   . Tobacco abuse   . Anxiety   . 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 DES PCI to RCA x 2 (3.0 mm x 22 mm Resolute - distal & proximal RCA) 01/14/2014    Class: Present on Admission  . Non-STEMI (non-ST elevated myocardial infarction) (HCC) 07/22/2013  . Atypical chest pain 07/21/2013  . Bipolar  1 disorder, depressed (HCC) 07/21/2013  . Chest pain 04/16/2013  . HLD (hyperlipidemia) 04/16/2013  . Hyperlipidemia with target LDL less than 70 01/27/2009  . SUBSTANCE ABUSE 01/27/2009  . DEPRESSION 01/27/2009  . Essential hypertension 01/27/2009  . CAD S/P BMS PCI to RCA with PTCA for ISR --> followed by stent thrombosis - DES PCI 01/27/2009  . GASTRITIS 01/27/2009    Past Surgical History:  Procedure Laterality Date  . CARDIAC CATHETERIZATION  01/16/2009   normal left main, Cfx with 2-OMs both w/minor irregularities, LAd with 20-30% mid  region irregularities, ramus intermediate/optional diagonal with 60% osital narrowing, RCA with stent in distal portion w/20% prox in-stent stenosis (Dr. Mervyn Skeeters. [place])  . CARDIAC CATHETERIZATION  07/23/2013   two vessel obstructive CAD, occluded first diagonal, focal in-stent restenosis in distal RCA (Dr. Peter Swaziland)  . CARDIAC CATHETERIZATION N/A 01/10/2015   Procedure: Left Heart Cath and Coronary Angiography;  Surgeon: Marykay Lex, MD;  Location: [facility] INVASIVE CV LAB;  Service: Cardiovascular;  Laterality: N/A;  . CARDIAC CATHETERIZATION N/A 01/10/2015   Procedure: Coronary Stent Intervention;  Surgeon: Marykay Lex, MD;  Location: [facility] INVASIVE CV LAB;  Service: Cardiovascular;  Laterality: N/A;  . CARDIAC CATHETERIZATION N/A 06/10/2015   Procedure: Left Heart Cath and Coronary Angiography;  Surgeon: Peter M Swaziland, MD;  Location: [facility] INVASIVE CV LAB;  Service: Cardiovascular;  Laterality: N/A;  . CARDIAC CATHETERIZATION N/A 09/27/2015   Procedure: Left Heart Cath and Coronary Angiography;  Surgeon: Corky Crafts, MD;  Location: [facility] INVASIVE CV LAB;  Service: Cardiovascular;  Laterality: N/A;  . CARDIAC CATHETERIZATION N/A 09/27/2015   Procedure: Coronary Balloon Angioplasty;  Surgeon: Corky Crafts, MD;  Location: [facility] INVASIVE CV LAB;  Service: Cardiovascular;  Laterality: N/A;  . CORONARY ANGIOPLASTY WITH STENT PLACEMENT  02/05/2007   3.5x42mm Quantum non-DES to RCA (Dr. Nicki Guadalajara)  . FEMORAL ARTERY STENT    . LEFT AND RIGHT HEART CATHETERIZATION WITH CORONARY ANGIOGRAM N/A 01/14/2014   Procedure: LEFT AND RIGHT HEART CATHETERIZATION WITH CORONARY ANGIOGRAM;  Surgeon: Kathleene Hazel, MD;  Location: [facility] CATH LAB;  Service: Cardiovascular;  Laterality: N/A;  . LEFT HEART CATHETERIZATION WITH CORONARY ANGIOGRAM N/A 07/23/2013   Procedure: LEFT HEART CATHETERIZATION WITH CORONARY ANGIOGRAM;  Surgeon: Peter M Swaziland, MD;  Location: [facility] CATH LAB;  Service: Cardiovascular;  Laterality:  N/A;       Home Medications    Prior to Admission medications   Medication Sig Start Date End Date Taking? Authorizing Provider  ARIPiprazole (ABILIFY) 5 MG tablet Take one tab (5 mg) in the morning.  Then tale 1 tab (5 mg) in the evening. 03/11/15   Adonis Brook, NP  aspirin EC 81 MG EC tablet Take 1 tablet (81 mg total) by mouth daily. 03/11/15   Adonis Brook, NP  atorvastatin (LIPITOR) 80 MG tablet Take 1 tablet (80 mg total) by mouth daily at 6 PM. 09/30/15   Leone Brand, NP  diphenhydrAMINE (BENADRYL) 50 MG capsule Take 1 capsule (50 mg total) by mouth every 6 (six) hours. 09/30/15   Leone Brand, NP  lisinopril (PRINIVIL,ZESTRIL) 5 MG tablet Take 1 tablet (5 mg total) by mouth daily. 07/07/15   Massie Maroon, FNP  metoprolol succinate (TOPROL-XL) 25 MG 24 hr tablet Take 1 tablet (25 mg total) by mouth daily. 07/07/15   Massie Maroon, FNP  Multiple Vitamin (MULTIVITAMIN WITH MINERALS) TABS tablet Take 1 tablet by mouth daily. 10/01/15   Leone Brand, NP  nitroGLYCERIN (NITROSTAT) 0.4 MG SL tablet Place 1 tablet (0.4 mg total) under the tongue every 5 (five) minutes x 3 doses as needed for chest pain. 06/11/15   Azalee CourseHao Meng, PA  ticagrelor (BRILINTA) 90 MG TABS tablet Take 1 tablet (90 mg total) by mouth 2 (two) times daily. 09/30/15   Leone BrandLaura R Ingold, NP  traZODone (DESYREL) 100 MG tablet Take 1 tablet (100 mg total) by mouth at bedtime as needed for sleep. Patient taking differently: Take 100 mg by mouth at bedtime.  03/11/15   Adonis BrookSheila Agustin, NP    Family History Family History  Problem Relation Age of Onset  . CAD Mother   . Heart disease Mother   . Heart attack Mother   . Schizophrenia Mother   . CAD Sister     Social History Social History  Substance Use Topics  . Smoking status: Current Every Day Smoker    Packs/day: 0.25    Years: 30.00    Types: Cigarettes  . Smokeless tobacco: Never Used  . Alcohol use 0.0 oz/week     Comment: 80 oz of beer weekly      Allergies   Iodine; Shellfish allergy; and Contrast media [iodinated diagnostic agents]   Review of Systems Review of Systems  Constitutional: Positive for chills. Negative for fever.  HENT: Negative for ear pain and sore throat.   Eyes: Negative for pain and visual disturbance.  Respiratory: Positive for cough. Negative for shortness of breath.   Cardiovascular: Negative for chest pain, palpitations and leg swelling.  Gastrointestinal: Positive for abdominal pain (epigastric), diarrhea, nausea and vomiting. Negative for blood in stool.  Genitourinary: Negative for dysuria, flank pain and hematuria.  Musculoskeletal: Negative for back pain and neck pain.  Skin: Negative for color change and rash.  Neurological: Negative for dizziness, seizures, syncope, weakness, numbness and headaches.     Physical Exam Updated Vital Signs BP 129/90 (BP Location: Right Arm)   Pulse 68   Temp 98.6 F (37 C) (Oral)   Resp 12   Ht 5\' 11"  (1.803 m)   Wt 61.2 kg   SpO2 100%   BMI 18.83 kg/m   Physical Exam  Constitutional: He is oriented to person, place, and time. He appears well-developed and well-nourished. No distress.  Appears uncomfortable but non-toxic, chills noted but patient is afebrile.  HENT:  Head: Normocephalic and atraumatic.  Mouth/Throat: Oropharynx is clear and moist.  TMs and ear canals normal bilaterally. No nasal mucosal edema or erythema. No frontal or maxillary sinus tenderness. No oropharyngeal exudate, erythema, or edema.  Eyes: Conjunctivae are normal.  Neck: Normal range of motion. Neck supple.  Cardiovascular: Normal rate, regular rhythm, normal heart sounds and intact distal pulses.   Pulmonary/Chest: Effort normal and breath sounds normal. No respiratory distress. He has no rales. He exhibits no tenderness.  Abdominal: Soft. Bowel sounds are normal. He exhibits no distension. There is tenderness. There is no guarding.  TTP in epigastric region  only. Negative Murphy's and McBurney's point tenderness. No CVA tenderness.  Musculoskeletal: Normal range of motion. He exhibits no edema or tenderness.  Lymphadenopathy:    He has no cervical adenopathy.  Neurological: He is alert and oriented to person, place, and time.  Skin: Skin is warm and dry. No rash noted.  Psychiatric: He has a normal mood and affect.  Nursing note and vitals reviewed.    ED Treatments / Results  Labs (all labs ordered are listed, but only abnormal results are displayed) Labs  Reviewed  COMPREHENSIVE METABOLIC PANEL - Abnormal; Notable for the following:       Result Value   Chloride 99 (*)    Total Protein 6.4 (*)    Total Bilirubin 1.3 (*)    All other components within normal limits  LIPASE, BLOOD  CBC  URINALYSIS, ROUTINE W REFLEX MICROSCOPIC (NOT AT [facility])  I-STAT TROPOININ, ED    EKG  EKG Interpretation  Date/Time:  Saturday November 08 2015 10:21:47 EDT Ventricular Rate:  57 PR Interval:    QRS Duration: 120 QT Interval:  446 QTC Calculation: 435 R Axis:   46 Text Interpretation:  Sinus rhythm Atrial premature complex IVCD, consider atypical RBBB Inferior infarct, age indeterminate Borderline ST elevation, anterior leads Confirmed by COOK  MD, BRIAN ([id number]) on 11/08/2015 10:25:56 AM       Radiology No results found.  Procedures Procedures (including critical care time)  Medications Ordered in ED Medications  ondansetron (ZOFRAN) injection 4 mg (4 mg Intravenous Given 11/08/15 1159)  sodium chloride 0.9 % bolus 1,000 mL (0 mLs Intravenous Stopped 11/08/15 1347)  morphine 2 MG/ML injection 4 mg (4 mg Intravenous Given 11/08/15 1206)  sodium chloride 0.9 % bolus 1,000 mL (0 mLs Intravenous Stopped 11/08/15 1548)  ondansetron (ZOFRAN) injection 4 mg (4 mg Intravenous Given 11/08/15 1443)  ondansetron (ZOFRAN-ODT) disintegrating tablet 8 mg (8 mg Oral Given 11/08/15 1743)  promethazine (PHENERGAN) injection 25 mg (25 mg Intravenous  Given 11/08/15 1932)     Initial Impression / Assessment and Plan / ED Course  I have reviewed the triage vital signs and the nursing notes.  Pertinent labs & imaging results that were available during my care of the patient were reviewed by me and considered in my medical decision making (see chart for details).  Clinical Course   Patient is 40 yo M presenting with chief complaint of nausea, vomiting, and diarrhea starting on Wednesday. Chills noted, but patient is afebrile and VSS. Mild epigastric TTP, but otherwise benign abdominal exam. EKG and troponin ordered given significant cardiac history, but no new evidence of infarct or ischemia concerning for ACS or MI. CXR normal. CT abdomen shows no acute pathology. CBC, CMP, and lipase unremarkable. Urinalysis shows evidence of dehydration, and patient given 2 L NS. Abdominal pain resolved after receiving 4 mg IV morphine, but patient's nausea persisted after 2 doses IV Zofran. On reassessment, patient given Zofran ODT and PO challenge prior to discharge, but he again vomited. Patient then given IV phenergan, and tolerated PO challenge. Patient stable for d/c home with prescription PO Phenergan, Imodium, and Pepcid. Patient likely has viral syndrome causing N/V/D, and advised to f/u with PCP and resume all daily medication. Patient evaluated by attending physician, Dr. Donnetta Hutching, who agrees with assessment and plan. Return precautions to ED discussed for symptoms including fever, worsening abdominal pain, or intractable vomiting and diarrhea. Of note, prolonged course in ED due to delay in receiving CT scan, as well as administration of multiple antiemetics prior d/c home.  Final Clinical Impressions(s) / ED Diagnoses   Final diagnoses:  Nausea vomiting and diarrhea    New Prescriptions Discharge Medication List as of 11/08/2015  8:25 PM    START taking these medications   Details  famotidine (PEPCID) 20 MG tablet Take 1 tablet (20 mg total)  by mouth 2 (two) times daily., Starting Sat 11/08/2015, Print    loperamide (IMODIUM) 2 MG capsule Take 1 capsule (2 mg total) by mouth 4 (four) times daily as  needed for diarrhea or loose stools., Starting Sat 11/08/2015, Print    promethazine (PHENERGAN) 25 MG tablet Take 1 tablet (25 mg total) by mouth every 6 (six) hours as needed for nausea or vomiting., Starting Sat 11/08/2015, Print         Ivonne Andrew Dennis II, [place] 11/09/15 2136    Donnetta Hutching, MD 11/12/15 1329

## 2015-11-08 NOTE — ED Notes (Signed)
Pt reminded again of need for urine

## 2015-11-08 NOTE — ED Notes (Signed)
PT STS HE DID NOT TOLERATE THE PO CHALLENGE.

## 2015-11-08 NOTE — ED Notes (Signed)
PT MADE AWARE OF THE NEED FOR A URINE SAMPLE. 

## 2015-11-08 NOTE — ED Notes (Signed)
Pt given an urinal and made aware of need for urine specimen. 

## 2015-11-08 NOTE — ED Triage Notes (Signed)
Per EMS patient comes from home for epigastric with n/v/d since WEd.  Patient had 7 heart attacks in past and states that has pain that radiates up into chest as to why he wanted to be seen today, but denies chest pain.

## 2015-11-08 NOTE — ED Notes (Signed)
Pt reminded of need for urine 

## 2015-11-08 NOTE — ED Notes (Signed)
WATER AND SALTINE CRACKERS GIVEN FOR PO CHALLENGE. 

## 2015-11-19 ENCOUNTER — Telehealth: Payer: Self-pay | Admitting: Cardiovascular Disease

## 2015-11-19 ENCOUNTER — Emergency Department (HOSPITAL_COMMUNITY)
Admission: EM | Admit: 2015-11-19 | Discharge: 2015-11-19 | Disposition: A | Discharge status: Home / Self Care | Payer: Medicaid Other | Attending: Emergency Medicine | Admitting: Emergency Medicine

## 2015-11-19 ENCOUNTER — Emergency Department (HOSPITAL_COMMUNITY): Payer: Medicaid Other

## 2015-11-19 ENCOUNTER — Encounter (HOSPITAL_COMMUNITY): Payer: Self-pay

## 2015-11-19 DIAGNOSIS — R079 Chest pain, unspecified: Secondary | ICD-10-CM | POA: Diagnosis present

## 2015-11-19 DIAGNOSIS — I2510 Atherosclerotic heart disease of native coronary artery without angina pectoris: Secondary | ICD-10-CM | POA: Insufficient documentation

## 2015-11-19 DIAGNOSIS — Z79899 Other long term (current) drug therapy: Secondary | ICD-10-CM | POA: Insufficient documentation

## 2015-11-19 DIAGNOSIS — F17210 Nicotine dependence, cigarettes, uncomplicated: Secondary | ICD-10-CM | POA: Diagnosis not present

## 2015-11-19 DIAGNOSIS — Z955 Presence of coronary angioplasty implant and graft: Secondary | ICD-10-CM | POA: Insufficient documentation

## 2015-11-19 DIAGNOSIS — Z9114 Patient's other noncompliance with medication regimen: Secondary | ICD-10-CM

## 2015-11-19 DIAGNOSIS — Z7982 Long term (current) use of aspirin: Secondary | ICD-10-CM | POA: Diagnosis not present

## 2015-11-19 DIAGNOSIS — R69 Illness, unspecified: Secondary | ICD-10-CM

## 2015-11-19 DIAGNOSIS — R10816 Epigastric abdominal tenderness: Secondary | ICD-10-CM | POA: Insufficient documentation

## 2015-11-19 LAB — CBG MONITORING, ED: GLUCOSE-CAPILLARY: 75 mg/dL (ref 65–99)

## 2015-11-19 LAB — TROPONIN I
Troponin I: <0.03 ng/mL (ref <0.03)
Troponin I: <0.03 ng/mL (ref <0.03)

## 2015-11-19 LAB — RAPID URINE DRUG SCREEN, HOSP PERFORMED
Amphetamines: NOT DETECTED
BARBITURATES: NOT DETECTED
Benzodiazepines: NOT DETECTED
COCAINE: NOT DETECTED
Opiates: NOT DETECTED
Tetrahydrocannabinol: POSITIVE — AB

## 2015-11-19 LAB — BASIC METABOLIC PANEL
ANION GAP: 7 (ref 5–15)
BUN: <5 mg/dL — ABNORMAL LOW (ref 6–20)
CALCIUM: 8.7 mg/dL — AB (ref 8.9–10.3)
CHLORIDE: 106 mmol/L (ref 101–111)
CO2: 25 mmol/L (ref 22–32)
Creatinine, Ser: 1.06 mg/dL (ref 0.61–1.24)
GFR calc Af Amer: >60 mL/min (ref >60)
GFR calc non Af Amer: >60 mL/min (ref >60)
GLUCOSE: 83 mg/dL (ref 65–99)
POTASSIUM: 4.6 mmol/L (ref 3.5–5.1)
Sodium: 138 mmol/L (ref 135–145)

## 2015-11-19 LAB — CBC WITH DIFFERENTIAL/PLATELET
BASOS ABS: 0 10*3/uL (ref 0.0–0.1)
Basophils Relative: 1 %
Eosinophils Absolute: 0.1 10*3/uL (ref 0.0–0.7)
Eosinophils Relative: 2 %
HEMATOCRIT: 41.2 % (ref 39.0–52.0)
HEMOGLOBIN: 14.3 g/dL (ref 13.0–17.0)
LYMPHS PCT: 31 %
Lymphs Abs: 1.7 10*3/uL (ref 0.7–4.0)
MCH: 30.9 pg (ref 26.0–34.0)
MCHC: 34.7 g/dL (ref 30.0–36.0)
MCV: 89 fL (ref 78.0–100.0)
MONO ABS: 0.4 10*3/uL (ref 0.1–1.0)
Monocytes Relative: 8 %
NEUTROS ABS: 3.3 10*3/uL (ref 1.7–7.7)
Neutrophils Relative %: 58 %
Platelets: 300 10*3/uL (ref 150–400)
RBC: 4.63 MIL/uL (ref 4.22–5.81)
RDW: 14.8 % (ref 11.5–15.5)
WBC: 5.6 10*3/uL (ref 4.0–10.5)

## 2015-11-19 LAB — I-STAT TROPONIN, ED: TROPONIN I, POC: 0 ng/mL (ref 0.00–0.08)

## 2015-11-19 LAB — HEPATIC FUNCTION PANEL
ALK PHOS: 47 U/L (ref 38–126)
ALT: 16 U/L — AB (ref 17–63)
AST: 18 U/L (ref 15–41)
Albumin: 2.8 g/dL — ABNORMAL LOW (ref 3.5–5.0)
Total Bilirubin: 0.5 mg/dL (ref 0.3–1.2)
Total Protein: 5.1 g/dL — ABNORMAL LOW (ref 6.5–8.1)

## 2015-11-19 LAB — LIPASE, BLOOD: Lipase: 25 U/L (ref 11–51)

## 2015-11-19 MED ORDER — METOPROLOL SUCCINATE ER 25 MG PO TB24
25.0000 mg | ORAL_TABLET | Freq: Every day | ORAL | Status: DC
  Filled 2015-11-19: qty 1

## 2015-11-19 MED ORDER — FAMOTIDINE 20 MG PO TABS
20.0000 mg | ORAL_TABLET | Freq: Once | ORAL | Status: AC
  Administered 2015-11-19: 20 mg via ORAL
  Filled 2015-11-19: qty 1

## 2015-11-19 MED ORDER — GI COCKTAIL ~~LOC~~
30.0000 mL | Freq: Once | ORAL | Status: AC
  Administered 2015-11-19: 30 mL via ORAL
  Filled 2015-11-19: qty 30

## 2015-11-19 MED ORDER — TICAGRELOR 90 MG PO TABS
90.0000 mg | ORAL_TABLET | Freq: Once | ORAL | Status: AC
  Administered 2015-11-19: 90 mg via ORAL
  Filled 2015-11-19: qty 1

## 2015-11-19 NOTE — Telephone Encounter (Signed)
Pt is aware brilinta samples placed at front desk, pt stated he will pick up tomorrow

## 2015-11-19 NOTE — ED Provider Notes (Signed)
[facility]-EMERGENCY DEPT Provider Note   CSN: [id number] Arrival date & time: 11/19/15  0915     History   Chief Complaint Chief Complaint  Patient presents with  . Chest Pain    HPI [name] is a 40 y.o. male.  HPI Patient reports he developed chest pain that has been present over the past 4 days. Initially he was not too concerned about it. He reports it was not very severe. Today however he reports that the pain is more intense and he became concerned that it was similar to his prior MIs. Pain is both a burning and a sharp quality that is right in the center of his chest. It does not radiate. He does report that it is worse with lying supine. He tried one of his nitroglycerin prior to EMS arrival and reports that it changed the pain from about a 6 to a 3. He also took aspirin prior to arrival. Patient had had nausea and vomiting earlier in the week. He is currently not having vomiting but continues to have some ongoing nausea. The diarrhea., No fevers no chills. No urinary symptoms. No lower started swelling or pain. Denies ever having been diagnosed with reflux or being prescribed a PPI. Patient ports his last cardiac catheterization was approximately a month ago. Past Medical History:  Diagnosis Date  . Anxiety   . Bipolar 1 disorder (HCC)   . CAD S/P percutaneous coronary angioplasty    a. Inf STEMI s/p BMS to RCA 01/2007. b. NSTEMI 07/2013 s/p PTCA to RCA for ISR; c. Transient inferior ST elevation (peak Ti 0.25) 01/2014 s/p PTCA/DES to pRCA, PTCA/DES to dRCA, EF 50%; d. 12/2014 Inf STEMI: RCA patent prox stent, 15627m/d (3.0x32 Synergy DES), EF 35-45; e. 05/2015 Inf STEMI/Cath: LM nl, LAD 10ost/m, D1 75, LCX nl, OM1 25, OM2 30, RCA patent stents, RPDA 30ost.  . Hx of medication noncompliance   . Hyperlipidemia   . Hypertensive heart disease   . Ischemic cardiomyopathy    a. EF 40% in 2011, 60% in 2012. b. EF 55% by cath 07/2013. c. EF 50% by cath 01/2014; d. 12/2014 EF 35-45% by LV  gram; e. 05/2015 Echo: EF 50-55% inflat/inf AK, mild AI; f. 06/2015 cMRI: EF 49%, basal & mid inf full thickness scar, subendocardial scar in antsept and antlat wall, correlating w/ D1 dzs.  Marland Kitchen. NSVT (nonsustained ventricular tachycardia) (HCC)    a. Very brief run during 07/2013 admit for NSTEMI felt due to MI.  . Polysubstance abuse    a. h/o tobacco, marijuana and crack cocaine use. b. 07/2013: +UDS THC, neg for cocaine.  . Tobacco abuse     Patient Active Problem List   Diagnosis Date Noted  . Malnutrition of moderate degree 09/28/2015  . Hyperlipidemia   . Schizoaffective disorder, bipolar type (HCC) 03/06/2015  . Nausea with vomiting 03/06/2015  . Hypertensive heart disease 01/11/2015  . ST elevation myocardial infarction (STEMI) of inferior wall (HCC) 01/10/2015  . ST elevation myocardial infarction (STEMI) of inferior wall, initial episode of care (HCC) 01/10/2015  . Medical non-compliance   . ST elevation myocardial infarction involving right coronary artery (HCC) 01/15/2014  . Hx of medication noncompliance   . NSVT (nonsustained ventricular tachycardia) (HCC)   . Ischemic cardiomyopathy   . Tobacco abuse   . Anxiety   . 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 DES PCI to RCA x 2 (3.0 mm x 22 mm Resolute - distal & proximal RCA) 01/14/2014  Class: Present on Admission  . Non-STEMI (non-ST elevated myocardial infarction) (HCC) 07/22/2013  . Atypical chest pain 07/21/2013  . Bipolar 1 disorder, depressed (HCC) 07/21/2013  . Chest pain 04/16/2013  . HLD (hyperlipidemia) 04/16/2013  . Hyperlipidemia with target LDL less than 70 01/27/2009  . SUBSTANCE ABUSE 01/27/2009  . DEPRESSION 01/27/2009  . Essential hypertension 01/27/2009  . CAD S/P BMS PCI to RCA with PTCA for ISR --> followed by stent thrombosis - DES PCI 01/27/2009  . GASTRITIS 01/27/2009    Past Surgical History:  Procedure Laterality Date  . CARDIAC CATHETERIZATION  01/16/2009   normal left main, Cfx with 2-OMs  both w/minor irregularities, LAd with 20-30% mid region irregularities, ramus intermediate/optional diagonal with 60% osital narrowing, RCA with stent in distal portion w/20% prox in-stent stenosis (Dr. Mervyn Skeeters. [place])  . CARDIAC CATHETERIZATION  07/23/2013   two vessel obstructive CAD, occluded first diagonal, focal in-stent restenosis in distal RCA (Dr. Peter Swaziland)  . CARDIAC CATHETERIZATION N/A 01/10/2015   Procedure: Left Heart Cath and Coronary Angiography;  Surgeon: Marykay Lex, MD;  Location: [facility] INVASIVE CV LAB;  Service: Cardiovascular;  Laterality: N/A;  . CARDIAC CATHETERIZATION N/A 01/10/2015   Procedure: Coronary Stent Intervention;  Surgeon: Marykay Lex, MD;  Location: [facility] INVASIVE CV LAB;  Service: Cardiovascular;  Laterality: N/A;  . CARDIAC CATHETERIZATION N/A 06/10/2015   Procedure: Left Heart Cath and Coronary Angiography;  Surgeon: Peter M Swaziland, MD;  Location: [facility] INVASIVE CV LAB;  Service: Cardiovascular;  Laterality: N/A;  . CARDIAC CATHETERIZATION N/A 09/27/2015   Procedure: Left Heart Cath and Coronary Angiography;  Surgeon: Corky Crafts, MD;  Location: [facility] INVASIVE CV LAB;  Service: Cardiovascular;  Laterality: N/A;  . CARDIAC CATHETERIZATION N/A 09/27/2015   Procedure: Coronary Balloon Angioplasty;  Surgeon: Corky Crafts, MD;  Location: [facility] INVASIVE CV LAB;  Service: Cardiovascular;  Laterality: N/A;  . CORONARY ANGIOPLASTY WITH STENT PLACEMENT  02/05/2007   3.5x57mm Quantum non-DES to RCA (Dr. Nicki Guadalajara)  . FEMORAL ARTERY STENT    . LEFT AND RIGHT HEART CATHETERIZATION WITH CORONARY ANGIOGRAM N/A 01/14/2014   Procedure: LEFT AND RIGHT HEART CATHETERIZATION WITH CORONARY ANGIOGRAM;  Surgeon: Kathleene Hazel, MD;  Location: [facility] CATH LAB;  Service: Cardiovascular;  Laterality: N/A;  . LEFT HEART CATHETERIZATION WITH CORONARY ANGIOGRAM N/A 07/23/2013   Procedure: LEFT HEART CATHETERIZATION WITH CORONARY ANGIOGRAM;  Surgeon: Peter M Swaziland, MD;  Location: [facility]  CATH LAB;  Service: Cardiovascular;  Laterality: N/A;       Home Medications    Prior to Admission medications   Medication Sig Start Date End Date Taking? Authorizing Provider  ARIPiprazole (ABILIFY) 5 MG tablet Take one tab (5 mg) in the morning.  Then tale 1 tab (5 mg) in the evening. 03/11/15  Yes Adonis Brook, NP  aspirin EC 81 MG EC tablet Take 1 tablet (81 mg total) by mouth daily. 03/11/15  Yes Adonis Brook, NP  atorvastatin (LIPITOR) 80 MG tablet Take 1 tablet (80 mg total) by mouth daily at 6 PM. 09/30/15   Leone Brand, NP  diphenhydrAMINE (BENADRYL) 50 MG capsule Take 1 capsule (50 mg total) by mouth every 6 (six) hours. 09/30/15   Leone Brand, NP  famotidine (PEPCID) 20 MG tablet Take 1 tablet (20 mg total) by mouth 2 (two) times daily. 11/08/15   Daryl F de Villier II, PA  lisinopril (PRINIVIL,ZESTRIL) 5 MG tablet Take 1 tablet (5 mg total) by mouth daily. 07/07/15   Erin Sons  Lawanda CousinsM Hollis, FNP  loperamide (IMODIUM) 2 MG capsule Take 1 capsule (2 mg total) by mouth 4 (four) times daily as needed for diarrhea or loose stools. 11/08/15   Daryl F de Villier II, PA  metoprolol succinate (TOPROL-XL) 25 MG 24 hr tablet Take 1 tablet (25 mg total) by mouth daily. 07/07/15   Massie MaroonLachina M Hollis, FNP  Multiple Vitamin (MULTIVITAMIN WITH MINERALS) TABS tablet Take 1 tablet by mouth daily. 10/01/15   Leone BrandLaura R Ingold, NP  nitroGLYCERIN (NITROSTAT) 0.4 MG SL tablet Place 1 tablet (0.4 mg total) under the tongue every 5 (five) minutes x 3 doses as needed for chest pain. 06/11/15   Azalee CourseHao Meng, PA  promethazine (PHENERGAN) 25 MG tablet Take 1 tablet (25 mg total) by mouth every 6 (six) hours as needed for nausea or vomiting. 11/08/15   Daryl F de Villier II, PA  ticagrelor (BRILINTA) 90 MG TABS tablet Take 1 tablet (90 mg total) by mouth 2 (two) times daily. 09/30/15   Leone BrandLaura R Ingold, NP  traZODone (DESYREL) 100 MG tablet Take 1 tablet (100 mg total) by mouth at bedtime as needed for sleep. Patient taking  differently: Take 100 mg by mouth at bedtime.  03/11/15   Adonis BrookSheila Agustin, NP    Family History Family History  Problem Relation Age of Onset  . CAD Mother   . Heart disease Mother   . Heart attack Mother   . Schizophrenia Mother   . CAD Sister     Social History Social History  Substance Use Topics  . Smoking status: Current Every Day Smoker    Packs/day: 0.25    Years: 30.00    Types: Cigarettes  . Smokeless tobacco: Never Used  . Alcohol use 0.0 oz/week     Comment: 80 oz of beer weekly     Allergies   Iodine; Shellfish allergy; and Contrast media [iodinated diagnostic agents]   Review of Systems Review of Systems 10 Systems reviewed and are negative for acute change except as noted in the HPI.   Physical Exam Updated Vital Signs BP 129/85   Pulse (!) 54   Resp 22   SpO2 100%   Physical Exam  Constitutional: He is oriented to person, place, and time. He appears well-developed and well-nourished.  Patient is alert and nontoxic. No respiratory distress. General appearance is well.  HENT:  Head: Normocephalic and atraumatic.  Mouth/Throat: Oropharynx is clear and moist.  Eyes: Conjunctivae and EOM are normal.  Neck: Neck supple.  Cardiovascular: Normal rate, regular rhythm, normal heart sounds and intact distal pulses.   No murmur heard. Pulmonary/Chest: Effort normal and breath sounds normal. No respiratory distress.  Abdominal: Soft. There is tenderness.  Mild epigastric tenderness without guarding.  Musculoskeletal: He exhibits no edema, tenderness or deformity.  Calves are soft and nontender. No peripheral edema.  Neurological: He is alert and oriented to person, place, and time. No cranial nerve deficit. He exhibits normal muscle tone. Coordination normal.  Skin: Skin is warm and dry.  Psychiatric: He has a normal mood and affect.  Nursing note and vitals reviewed.    ED Treatments / Results  Labs (all labs ordered are listed, but only abnormal  results are displayed) Labs Reviewed  BASIC METABOLIC PANEL - Abnormal; Notable for the following:       Result Value   BUN <5 (*)    Calcium 8.7 (*)    All other components within normal limits  HEPATIC FUNCTION PANEL - Abnormal; Notable for  the following:    Total Protein 5.1 (*)    Albumin 2.8 (*)    ALT 16 (*)    Bilirubin, Direct <0.1 (*)    All other components within normal limits  CBC WITH DIFFERENTIAL/PLATELET  LIPASE, BLOOD  TROPONIN I  TROPONIN I  RAPID URINE DRUG SCREEN, HOSP PERFORMED  CBG MONITORING, ED  I-STAT TROPOININ, ED    EKG  EKG Interpretation  Date/Time:  Wednesday November 19 2015 09:19:44 EST Ventricular Rate:  56 PR Interval:    QRS Duration: 112 QT Interval:  442 QTC Calculation: 427 R Axis:   2 Text Interpretation:  Sinus rhythm Borderline intraventricular conduction delay RSR' in V1 or V2, right VCD or RVH Borderline ST elevation, anterior leads Baseline wander in lead(s) V2 agree. similar to previous Confirmed by Donnald Garre, MD, Lebron Conners [phone]) on 11/19/2015 9:25:40 AM Also confirmed by Donnald Garre, MD, Lebron Conners [phone]), editor [place], Cala Bradford [phone])  on 11/19/2015 9:32:34 AM       Radiology Dg Chest 2 View  Result Date: 11/19/2015 CLINICAL DATA:  Chest pain and shortness of breath for 4 days. EXAM: CHEST  2 VIEW COMPARISON:  11/08/2015 FINDINGS: The cardiac silhouette, mediastinal and hilar contours are normal in stable. Coronary artery stents are noted. The lungs are clear. Slight hyperinflation. The bony thorax is intact. IMPRESSION: No acute cardiopulmonary findings. Electronically Signed   By: Rudie Meyer M.D.   On: 11/19/2015 10:33    Procedures Procedures (including critical care time)  Medications Ordered in ED Medications  metoprolol succinate (TOPROL-XL) 24 hr tablet 25 mg (25 mg Oral Not Given 11/19/15 1354)  famotidine (PEPCID) tablet 20 mg (20 mg Oral Given 11/19/15 1056)  gi cocktail (Maalox,Lidocaine,Donnatal) (30 mLs Oral Given  11/19/15 1056)  ticagrelor (BRILINTA) tablet 90 mg (90 mg Oral Given 11/19/15 1352)     Initial Impression / Assessment and Plan / ED Course  I have reviewed the triage vital signs and the nursing notes.  Pertinent labs & imaging results that were available during my care of the patient were reviewed by me and considered in my medical decision making (see chart for details).  Clinical Course      Final Clinical Impressions(s) / ED Diagnoses   Final diagnoses:  Chest pain, unspecified type  H/O medication noncompliance  Severe comorbid illness   Patient has had 4 days of chest pain. At this time he rules out for MI by cardiac enzymes. Patient did have a cardiac catheterization in September with prior MI. At this time, I do believe patient's pain is less likely to be cardiac in etiology given duration and recent associated symptoms of nausea and vomiting. Patient is advised to continue taking Pepcid twice daily which he states he has not been taking and obtaining his medications. Case management has evaluated this and patient does have available resources to obtain his medications. Reportedly they can be obtained through [facility]. New Prescriptions New Prescriptions   No medications on file     Arby Barrette, MD 11/19/15 1407

## 2015-11-19 NOTE — Telephone Encounter (Signed)
New Message  Patient calling the office for samples of medication:   1.  What medication and dosage are you requesting samples for? Brilinta 90 mg tablets twice daily totaling 90 mg tablets  2.  Are you currently out of this medication?  Yes

## 2015-11-19 NOTE — Discharge Planning (Signed)
Arville Postlewaite J. Lucretia RoersWood, RN, BSN, [place] [phone]inda [facility] @9 :30.  Spoke with pt at bedside and provided brochure with directions and phone number highlighted.  Pt verbalizes understanding of keeping appointment.  [facility] or [facility] for Rx needs.

## 2015-11-19 NOTE — Discharge Instructions (Signed)
You have been given instructions on your available resources for getting your medications. A follow-up appointment has been scheduled for you. Your instructions are above in the discharge instructions

## 2015-11-19 NOTE — ED Triage Notes (Signed)
Per [facility]: Pt has been having chest pain for the last 4 days. Pt has history of MI X7. The pt states that his pain today feels the same as his previous MI's. The pt states that his pain is "tightness and pressure". The pt states that he has intermittent numbness in his left arm, says that this is the same as his previous MIs. EMS was unable to obtain IV access. Pt took 0.4 of nitro prior to EMS arrival, states that this helped his pain. Pt took 3 ASA prior to EMS arrival.

## 2015-11-25 ENCOUNTER — Ambulatory Visit: Payer: Self-pay | Admitting: Cardiovascular Disease

## 2015-11-25 ENCOUNTER — Ambulatory Visit (INDEPENDENT_AMBULATORY_CARE_PROVIDER_SITE_OTHER): Payer: Medicaid Other | Admitting: Family Medicine

## 2015-11-25 ENCOUNTER — Other Ambulatory Visit: Payer: Self-pay

## 2015-11-25 VITALS — BP 112/81 | HR 68 | Temp 97.9°F | Resp 14 | Ht 72.0 in | Wt 136.0 lb

## 2015-11-25 DIAGNOSIS — Z23 Encounter for immunization: Secondary | ICD-10-CM | POA: Diagnosis not present

## 2015-11-25 DIAGNOSIS — I10 Essential (primary) hypertension: Secondary | ICD-10-CM

## 2015-11-25 DIAGNOSIS — K219 Gastro-esophageal reflux disease without esophagitis: Secondary | ICD-10-CM

## 2015-11-25 MED ORDER — METOPROLOL SUCCINATE ER 25 MG PO TB24: 25.0000 mg | ORAL_TABLET | Freq: Every day | ORAL | 1 refills | Status: DC

## 2015-11-25 MED ORDER — LISINOPRIL 5 MG PO TABS: 5.0000 mg | ORAL_TABLET | Freq: Every day | ORAL | 1 refills | Status: DC

## 2015-11-25 NOTE — Patient Instructions (Signed)
Take Dexilant one a day until you can afford to get your Pepcid.

## 2015-11-25 NOTE — Progress Notes (Signed)
[name], is a 40 y.o. male  ONG:[id number]:[id number]  GMW:[id number]  DOB - Aug 01, 1975  CC:  Chief Complaint  Patient presents with  . Hypertension       HPI: [name] is a 40 y.o. male here for follow-up hypertension. He is currently on lisinopril 5 mg and Toprol XL 25 mg daily. He has a history of CAD, MI, stent, cardiomyopathy and gastritis. He was seen recently in ED with chest pain. Cardiac origin was ruled out. It was recommended he take Pepcid but he has not been able to afford. He also has a history of Bipolar Disorder   Allergies  Allergen Reactions  . Iodine Anaphylaxis and Swelling  . Shellfish Allergy Anaphylaxis    All shellfish  . Contrast Media [Iodinated Diagnostic Agents] Nausea And Vomiting   Past Medical History:  Diagnosis Date  . Anxiety   . Bipolar 1 disorder (HCC)   . CAD S/P percutaneous coronary angioplasty    a. Inf STEMI s/p BMS to RCA 01/2007. b. NSTEMI 07/2013 s/p PTCA to RCA for ISR; c. Transient inferior ST elevation (peak Ti 0.25) 01/2014 s/p PTCA/DES to pRCA, PTCA/DES to dRCA, EF 50%; d. 12/2014 Inf STEMI: RCA patent prox stent, 1441m/d (3.0x32 Synergy DES), EF 35-45; e. 05/2015 Inf STEMI/Cath: LM nl, LAD 10ost/m, D1 75, LCX nl, OM1 25, OM2 30, RCA patent stents, RPDA 30ost.  . Hx of medication noncompliance   . Hyperlipidemia   . Hypertensive heart disease   . Ischemic cardiomyopathy    a. EF 40% in 2011, 60% in 2012. b. EF 55% by cath 07/2013. c. EF 50% by cath 01/2014; d. 12/2014 EF 35-45% by LV gram; e. 05/2015 Echo: EF 50-55% inflat/inf AK, mild AI; f. 06/2015 cMRI: EF 49%, basal & mid inf full thickness scar, subendocardial scar in antsept and antlat wall, correlating w/ D1 dzs.  Marland Kitchen. NSVT (nonsustained ventricular tachycardia) (HCC)    a. Very brief run during 07/2013 admit for NSTEMI felt due to MI.  . Polysubstance abuse    a. h/o tobacco, marijuana and crack cocaine use. b. 07/2013: +UDS THC, neg for cocaine.  . Tobacco abuse    Current  Outpatient Prescriptions on File Prior to Visit  Medication Sig Dispense Refill  . ARIPiprazole (ABILIFY) 5 MG tablet Take one tab (5 mg) in the morning.  Then tale 1 tab (5 mg) in the evening. 60 tablet 0  . aspirin EC 81 MG EC tablet Take 1 tablet (81 mg total) by mouth daily. 30 tablet 0  . atorvastatin (LIPITOR) 80 MG tablet Take 1 tablet (80 mg total) by mouth daily at 6 PM. 30 tablet 6  . diphenhydrAMINE (BENADRYL) 50 MG capsule Take 1 capsule (50 mg total) by mouth every 6 (six) hours. 30 capsule 0  . famotidine (PEPCID) 20 MG tablet Take 1 tablet (20 mg total) by mouth 2 (two) times daily. 30 tablet 0  . loperamide (IMODIUM) 2 MG capsule Take 1 capsule (2 mg total) by mouth 4 (four) times daily as needed for diarrhea or loose stools. 12 capsule 0  . Multiple Vitamin (MULTIVITAMIN WITH MINERALS) TABS tablet Take 1 tablet by mouth daily.    . nitroGLYCERIN (NITROSTAT) 0.4 MG SL tablet Place 1 tablet (0.4 mg total) under the tongue every 5 (five) minutes x 3 doses as needed for chest pain. 25 tablet 3  . promethazine (PHENERGAN) 25 MG tablet Take 1 tablet (25 mg total) by mouth every 6 (six) hours as needed for  nausea or vomiting. 12 tablet 0  . ticagrelor (BRILINTA) 90 MG TABS tablet Take 1 tablet (90 mg total) by mouth 2 (two) times daily. 60 tablet 11  . traZODone (DESYREL) 100 MG tablet Take 1 tablet (100 mg total) by mouth at bedtime as needed for sleep. (Patient taking differently: Take 100 mg by mouth at bedtime. ) 30 tablet 0   No current facility-administered medications on file prior to visit.    Family History  Problem Relation Age of Onset  . CAD Mother   . Heart disease Mother   . Heart attack Mother   . Schizophrenia Mother   . CAD Sister    Social History   Social History  . Marital status: Single    Spouse name: N/A  . Number of children: N/A  . Years of education: N/A   Occupational History  . Not on file.   Social History Main Topics  . Smoking status:  Current Every Day Smoker    Packs/day: 0.25    Years: 30.00    Types: Cigarettes  . Smokeless tobacco: Never Used  . Alcohol use 0.0 oz/week     Comment: 80 oz of beer weekly  . Drug use: No     Comment: Marijuana (last used on 01/07/2015)  . Sexual activity: Not Currently   Other Topics Concern  . Not on file   Social History Narrative  . No narrative on file    Review of Systems: Constitutional: + for fatigue, loss of appetite.  Skin: Negative HENT: Negative  Eyes: Negative  Neck: Negative Respiratory: + for some shortness of breath.  Cardiovascular+ for occ palpitation. Gastrointestinal: Negative Genitourinary: Negative  Musculoskeletal: + for back pain  Neurological: Negative for Hematological: Negative  Psychiatric/Behavioral: Negative    Objective:   Vitals:   11/25/15 0924  BP: 112/81  Pulse: 68  Resp: 14  Temp: 97.9 F (36.6 C)    Physical Exam: Constitutional: Patient appears well-developed and well-nourished. No distress. HENT: Normocephalic, atraumatic, External right and left ear normal. Oropharynx is clear and moist.  Eyes: Conjunctivae and EOM are normal. PERRLA, no scleral icterus. Neck: Normal ROM. Neck supple. No lymphadenopathy, No thyromegaly. CVS: RRR, S1/S2 +, no murmurs, no gallops, no rubs Pulmonary: Effort and breath sounds normal, no stridor, rhonchi, wheezes, rales.  Abdominal: Soft. Normoactive BS,, no distension, rebound or guarding. There is epigastric tenderness Musculoskeletal: Normal range of motion. No edema and no tenderness.  Neuro: Alert.Normal muscle tone coordination. Non-focal Skin: Skin is warm and dry. No rash noted. Not diaphoretic. No erythema. No pallor. Psychiatric: Normal mood and affect. Behavior, judgment, thought content normal.  Lab Results  Component Value Date   WBC 5.6 11/19/2015   HGB 14.3 11/19/2015   HCT 41.2 11/19/2015   MCV 89.0 11/19/2015   PLT 300 11/19/2015   Lab Results  Component Value  Date   CREATININE 1.06 11/19/2015   BUN <5 (L) 11/19/2015   NA 138 11/19/2015   K 4.6 11/19/2015   CL 106 11/19/2015   CO2 25 11/19/2015    Lab Results  Component Value Date   HGBA1C 5.3 06/11/2015   Lipid Panel     Component Value Date/Time   CHOL 226 (H) 06/11/2015 0311   TRIG 74 06/11/2015 0311   HDL 50 06/11/2015 0311   CHOLHDL 4.5 06/11/2015 0311   VLDL 15 06/11/2015 0311   LDLCALC 161 (H) 06/11/2015 0311        Assessment and plan:   1.  Essential hypertension  - lisinopril (PRINIVIL,ZESTRIL) 5 MG tablet; Take 1 tablet (5 mg total) by mouth daily.  Dispense: 90 tablet; Refill: 1 - metoprolol succinate (TOPROL-XL) 25 MG 24 hr tablet; Take 1 tablet (25 mg total) by mouth daily.  Dispense: 90 tablet; Refill: 1  2. Need for prophylactic vaccination and inoculation against influenza  - Flu Vaccine QUAD 36+ mos PF IM (Fluarix & Fluzone Quad PF)  3. Need for Tdap vaccination  - Tdap vaccine greater than or equal to 7yo IM  4. Gastroesophageal reflux disease, esophagitis presence not specified -Samples of Dexilant for one month until he can fill his other prescription.   Return in about 6 months (around 05/24/2016).  The patient was given clear instructions to go to ER or return to medical center if symptoms don't improve, worsen or new problems develop. The patient verbalized understanding.    Henrietta Hoover FNP  11/25/2015, 11:10 AM

## 2015-12-17 ENCOUNTER — Ambulatory Visit: Payer: Self-pay | Admitting: Cardiovascular Disease

## 2016-01-08 ENCOUNTER — Encounter: Payer: Self-pay | Admitting: *Deleted

## 2016-01-08 ENCOUNTER — Ambulatory Visit: Payer: Medicaid Other | Admitting: Physician Assistant

## 2016-01-08 NOTE — Progress Notes (Deleted)
Cardiology Office Note   Date:  01/08/2016   ID:  Justin Bameyrone J Cary, DOB 1975-08-13, MRN [id number]  PCP:  Massie MaroonHollis,Lachina M, FNP  Cardiologist:  Dr Venora MaplesKelly  Kearra Calkin, PA-C 10/07/2015  No chief complaint on file.   History of Present Illness: [name] is a 40 y.o. male with a history of HTN, STEMI w/ BMS RCA 2009, NSTEMI 2015 w/ PTCA RCA (ISR), DES 01/2014 pRCA, STEMI 12/2014 w/ DES mRCA, STEMI 05/2015 w/ non-obs dz, HTN, HLD, ICM w/ EF49% cMRI +scar, med noncompliance, homelessness, bipolar disorder, schizoaffective disorder  11/08 ER visit for CP x 4 days, neg MI, +N&V, GI rx given 11/14 PCP visit w/ lisinopril started, Dexilant samples given   Justin Bameyrone J Dehaven presents for ***   Past Medical History:  Diagnosis Date  . Anxiety   . Bipolar 1 disorder (HCC)   . CAD S/P percutaneous coronary angioplasty    a. Inf STEMI s/p BMS to RCA 01/2007. b. NSTEMI 07/2013 s/p PTCA to RCA for ISR; c. Transient inferior ST elevation (peak Ti 0.25) 01/2014 s/p PTCA/DES to pRCA, PTCA/DES to dRCA, EF 50%; d. 12/2014 Inf STEMI: RCA patent prox stent, 1910m/d (3.0x32 Synergy DES), EF 35-45; e. 05/2015 Inf STEMI/Cath: LM nl, LAD 10ost/m, D1 75, LCX nl, OM1 25, OM2 30, RCA patent stents, RPDA 30ost.  . Hx of medication noncompliance   . Hyperlipidemia   . Hypertensive heart disease   . Ischemic cardiomyopathy    a. EF 40% in 2011, 60% in 2012. b. EF 55% by cath 07/2013. c. EF 50% by cath 01/2014; d. 12/2014 EF 35-45% by LV gram; e. 05/2015 Echo: EF 50-55% inflat/inf AK, mild AI; f. 06/2015 cMRI: EF 49%, basal & mid inf full thickness scar, subendocardial scar in antsept and antlat wall, correlating w/ D1 dzs.  Marland Kitchen. NSVT (nonsustained ventricular tachycardia) (HCC)    a. Very brief run during 07/2013 admit for NSTEMI felt due to MI.  . Polysubstance abuse    a. h/o tobacco, marijuana and crack cocaine use. b. 07/2013: +UDS THC, neg for cocaine.  . Tobacco abuse     Past Surgical History:    Procedure Laterality Date  . CARDIAC CATHETERIZATION  01/16/2009   normal left main, Cfx with 2-OMs both w/minor irregularities, LAd with 20-30% mid region irregularities, ramus intermediate/optional diagonal with 60% osital narrowing, RCA with stent in distal portion w/20% prox in-stent stenosis (Dr. Mervyn SkeetersA. Little)  . CARDIAC CATHETERIZATION  07/23/2013   two vessel obstructive CAD, occluded first diagonal, focal in-stent restenosis in distal RCA (Dr. Peter SwazilandJordan)  . CARDIAC CATHETERIZATION N/A 01/10/2015   Procedure: Left Heart Cath and Coronary Angiography;  Surgeon: Marykay Lexavid W Harding, MD;  [facility];  Service: Cardiovascular;  Laterality: N/A;  . CARDIAC CATHETERIZATION N/A 01/10/2015   Procedure: Coronary Stent Intervention;  Surgeon: Marykay Lexavid W Harding, MD;  [facility];  Service: Cardiovascular;  Laterality: N/A;  . CARDIAC CATHETERIZATION N/A 06/10/2015   Procedure: Left Heart Cath and Coronary Angiography;  Surgeon: Peter M SwazilandJordan, MD;  [facility];  Service: Cardiovascular;  Laterality: N/A;  . CARDIAC CATHETERIZATION N/A 09/27/2015   Procedure: Left Heart Cath and Coronary Angiography;  Surgeon: Corky CraftsJayadeep S Varanasi, MD;  [facility];  Service: Cardiovascular;  Laterality: N/A;  . CARDIAC CATHETERIZATION N/A 09/27/2015   Procedure: Coronary Balloon Angioplasty;  Surgeon: Corky CraftsJayadeep S Varanasi, MD;  Location: [facility] INVASIVE CV LAB;  Service: Cardiovascular;  Laterality: N/A;  . CORONARY ANGIOPLASTY WITH STENT PLACEMENT  02/05/2007   3.5x31mm Quantum non-DES to RCA (Dr. Nicki Guadalajara)  . FEMORAL ARTERY STENT    . LEFT AND RIGHT HEART CATHETERIZATION WITH CORONARY ANGIOGRAM N/A 01/14/2014   Procedure: LEFT AND RIGHT HEART CATHETERIZATION WITH CORONARY ANGIOGRAM;  Surgeon: Kathleene Hazel, MD;  Location: [facility] CATH LAB;  Service: Cardiovascular;  Laterality: N/A;  . LEFT HEART CATHETERIZATION WITH CORONARY ANGIOGRAM N/A 07/23/2013    Procedure: LEFT HEART CATHETERIZATION WITH CORONARY ANGIOGRAM;  Surgeon: Peter M Swaziland, MD;  Location[facility] CATH LAB;  Service: Cardiovascular;  Laterality: N/A;    Current Outpatient Prescriptions  Medication Sig Dispense Refill  . ARIPiprazole (ABILIFY) 5 MG tablet Take one tab (5 mg) in the morning.  Then tale 1 tab (5 mg) in the evening. 60 tablet 0  . aspirin EC 81 MG EC tablet Take 1 tablet (81 mg total) by mouth daily. 30 tablet 0  . atorvastatin (LIPITOR) 80 MG tablet Take 1 tablet (80 mg total) by mouth daily at 6 PM. 30 tablet 6  . diphenhydrAMINE (BENADRYL) 50 MG capsule Take 1 capsule (50 mg total) by mouth every 6 (six) hours. 30 capsule 0  . famotidine (PEPCID) 20 MG tablet Take 1 tablet (20 mg total) by mouth 2 (two) times daily. 30 tablet 0  . lisinopril (PRINIVIL,ZESTRIL) 5 MG tablet Take 1 tablet (5 mg total) by mouth daily. 90 tablet 1  . loperamide (IMODIUM) 2 MG capsule Take 1 capsule (2 mg total) by mouth 4 (four) times daily as needed for diarrhea or loose stools. 12 capsule 0  . metoprolol succinate (TOPROL-XL) 25 MG 24 hr tablet Take 1 tablet (25 mg total) by mouth daily. 90 tablet 1  . Multiple Vitamin (MULTIVITAMIN WITH MINERALS) TABS tablet Take 1 tablet by mouth daily.    . nitroGLYCERIN (NITROSTAT) 0.4 MG SL tablet Place 1 tablet (0.4 mg total) under the tongue every 5 (five) minutes x 3 doses as needed for chest pain. 25 tablet 3  . promethazine (PHENERGAN) 25 MG tablet Take 1 tablet (25 mg total) by mouth every 6 (six) hours as needed for nausea or vomiting. 12 tablet 0  . ticagrelor (BRILINTA) 90 MG TABS tablet Take 1 tablet (90 mg total) by mouth 2 (two) times daily. 60 tablet 11  . traZODone (DESYREL) 100 MG tablet Take 1 tablet (100 mg total) by mouth at bedtime as needed for sleep. (Patient taking differently: Take 100 mg by mouth at bedtime. ) 30 tablet 0   No current facility-administered medications for this visit.     Allergies:   Iodine; Shellfish  allergy; and Contrast media [iodinated diagnostic agents]    Social History:  The patient  reports that he has been smoking Cigarettes.  He has a 7.50 pack-year smoking history. He has never used smokeless tobacco. He reports that he drinks alcohol. He reports that he does not use drugs.   Family History:  The patient's family history includes CAD in his mother and sister; Heart attack in his mother; Heart disease in his mother; Schizophrenia in his mother.    ROS:  Please see the history of present illness. All other systems are reviewed and negative.    PHYSICAL EXAM: VS:  There were no vitals taken for this visit. , BMI There is no height or weight on file to calculate BMI. GEN: Well nourished, well developed, male in no acute distress  HEENT: normal for age  Neck: no  JVD, no carotid bruit, no masses Cardiac: RRR; no murmur, no rubs, or gallops Respiratory:  clear to auscultation bilaterally, normal work of breathing GI: soft, nontender, nondistended, + BS MS: no deformity or atrophy; no edema; distal pulses are 2+ in all 4 extremities   Skin: warm and dry, no rash Neuro:  Strength and sensation are intact Psych: euthymic mood, full affect   EKG:  EKG {ACTION; IS/IS [id number]} ordered today. The ekg ordered today demonstrates ***   Recent Labs: 03/08/2015: TSH 0.730 09/27/2015: Magnesium 2.0 11/19/2015: ALT 16; BUN <5; Creatinine, Ser 1.06; Hemoglobin 14.3; Platelets 300; Potassium 4.6; Sodium 138    Lipid Panel    Component Value Date/Time   CHOL 226 (H) 06/11/2015 0311   TRIG 74 06/11/2015 0311   HDL 50 06/11/2015 0311   CHOLHDL 4.5 06/11/2015 0311   VLDL 15 06/11/2015 0311   LDLCALC 161 (H) 06/11/2015 0311     Wt Readings from Last 3 Encounters:  11/25/15 136 lb (61.7 kg)  11/08/15 135 lb (61.2 kg)  10/07/15 138 lb (62.6 kg)     Other studies Reviewed: Additional studies/ records that were reviewed today include: ***.  ASSESSMENT AND PLAN:  1.   ***   Current medicines are reviewed at length with the patient today.  The patient {ACTIONS; HAS/DOES NOT HAVE:19233} concerns regarding medicines.  The following changes have been made:  {PLAN; NO CHANGE:13088:s}  Labs/ tests ordered today include: *** No orders of the defined types were placed in this encounter.    Disposition:   FU with ***  Signed, Theodore DemarkBarrett, Misa Fedorko, PA-C  01/08/2016 1:10 PM    [facility] Phone: [phone]; Fax: [phone]  This note was written with the assistance of speech recognition software. Please excuse any transcriptional errors.

## 2016-01-14 ENCOUNTER — Ambulatory Visit: Payer: Self-pay | Admitting: Family Medicine

## 2016-03-13 ENCOUNTER — Encounter (HOSPITAL_COMMUNITY): Payer: Self-pay | Admitting: Emergency Medicine

## 2016-03-13 ENCOUNTER — Emergency Department (HOSPITAL_COMMUNITY)
Admission: EM | Admit: 2016-03-13 | Discharge: 2016-03-15 | Disposition: A | Discharge status: Home / Self Care | Payer: Medicaid Other | Attending: Emergency Medicine | Admitting: Emergency Medicine

## 2016-03-13 DIAGNOSIS — F1220 Cannabis dependence, uncomplicated: Secondary | ICD-10-CM | POA: Diagnosis present

## 2016-03-13 DIAGNOSIS — R45851 Suicidal ideations: Secondary | ICD-10-CM | POA: Diagnosis present

## 2016-03-13 DIAGNOSIS — F17210 Nicotine dependence, cigarettes, uncomplicated: Secondary | ICD-10-CM | POA: Diagnosis not present

## 2016-03-13 DIAGNOSIS — I10 Essential (primary) hypertension: Secondary | ICD-10-CM

## 2016-03-13 DIAGNOSIS — I2510 Atherosclerotic heart disease of native coronary artery without angina pectoris: Secondary | ICD-10-CM | POA: Insufficient documentation

## 2016-03-13 DIAGNOSIS — Z7982 Long term (current) use of aspirin: Secondary | ICD-10-CM | POA: Diagnosis not present

## 2016-03-13 DIAGNOSIS — Z818 Family history of other mental and behavioral disorders: Secondary | ICD-10-CM | POA: Diagnosis not present

## 2016-03-13 DIAGNOSIS — F250 Schizoaffective disorder, bipolar type: Secondary | ICD-10-CM | POA: Diagnosis not present

## 2016-03-13 DIAGNOSIS — Z79899 Other long term (current) drug therapy: Secondary | ICD-10-CM | POA: Insufficient documentation

## 2016-03-13 DIAGNOSIS — I252 Old myocardial infarction: Secondary | ICD-10-CM | POA: Diagnosis not present

## 2016-03-13 DIAGNOSIS — I255 Ischemic cardiomyopathy: Secondary | ICD-10-CM

## 2016-03-13 LAB — COMPREHENSIVE METABOLIC PANEL
ALBUMIN: 4.3 g/dL (ref 3.5–5.0)
ALK PHOS: 81 U/L (ref 38–126)
ALT: 20 U/L (ref 17–63)
AST: 22 U/L (ref 15–41)
Anion gap: 11 (ref 5–15)
CALCIUM: 9.4 mg/dL (ref 8.9–10.3)
CHLORIDE: 99 mmol/L — AB (ref 101–111)
CO2: 24 mmol/L (ref 22–32)
CREATININE: 1.16 mg/dL (ref 0.61–1.24)
GFR calc non Af Amer: >60 mL/min (ref >60)
GLUCOSE: 104 mg/dL — AB (ref 65–99)
Potassium: 3.5 mmol/L (ref 3.5–5.1)
SODIUM: 134 mmol/L — AB (ref 135–145)
Total Bilirubin: 0.9 mg/dL (ref 0.3–1.2)
Total Protein: 7.9 g/dL (ref 6.5–8.1)

## 2016-03-13 LAB — CBC
HEMATOCRIT: 42.8 % (ref 39.0–52.0)
HEMOGLOBIN: 14.9 g/dL (ref 13.0–17.0)
MCH: 30.8 pg (ref 26.0–34.0)
MCHC: 34.8 g/dL (ref 30.0–36.0)
MCV: 88.4 fL (ref 78.0–100.0)
Platelets: 230 10*3/uL (ref 150–400)
RBC: 4.84 MIL/uL (ref 4.22–5.81)
RDW: 16.7 % — ABNORMAL HIGH (ref 11.5–15.5)
WBC: 5 10*3/uL (ref 4.0–10.5)

## 2016-03-13 LAB — RAPID URINE DRUG SCREEN, HOSP PERFORMED
AMPHETAMINES: NOT DETECTED
BARBITURATES: NOT DETECTED
Benzodiazepines: NOT DETECTED
Cocaine: NOT DETECTED
OPIATES: NOT DETECTED
TETRAHYDROCANNABINOL: POSITIVE — AB

## 2016-03-13 LAB — SALICYLATE LEVEL

## 2016-03-13 LAB — ACETAMINOPHEN LEVEL: Acetaminophen (Tylenol), Serum: <10 ug/mL — ABNORMAL LOW (ref 10–30)

## 2016-03-13 LAB — ETHANOL: Alcohol, Ethyl (B): <5 mg/dL (ref <5)

## 2016-03-13 MED ORDER — FAMOTIDINE 20 MG PO TABS
20.0000 mg | ORAL_TABLET | Freq: Two times a day (BID) | ORAL | Status: DC
  Administered 2016-03-13 – 2016-03-15 (×5): 20 mg via ORAL
  Filled 2016-03-13 (×5): qty 1

## 2016-03-13 MED ORDER — LOPERAMIDE HCL 2 MG PO CAPS: 2.0000 mg | ORAL_CAPSULE | Freq: Four times a day (QID) | ORAL | Status: DC | PRN

## 2016-03-13 MED ORDER — TRAZODONE HCL 100 MG PO TABS
100.0000 mg | ORAL_TABLET | Freq: Every evening | ORAL | Status: DC | PRN
  Administered 2016-03-14: 100 mg via ORAL
  Filled 2016-03-13: qty 1

## 2016-03-13 MED ORDER — METOPROLOL SUCCINATE ER 25 MG PO TB24
25.0000 mg | ORAL_TABLET | Freq: Every day | ORAL | Status: DC
  Administered 2016-03-13 – 2016-03-15 (×3): 25 mg via ORAL
  Filled 2016-03-13 (×3): qty 1

## 2016-03-13 MED ORDER — LISINOPRIL 5 MG PO TABS
5.0000 mg | ORAL_TABLET | Freq: Every day | ORAL | Status: DC
  Administered 2016-03-13 – 2016-03-15 (×3): 5 mg via ORAL
  Filled 2016-03-13 (×3): qty 1

## 2016-03-13 MED ORDER — PROMETHAZINE HCL 25 MG PO TABS: 25.0000 mg | ORAL_TABLET | Freq: Four times a day (QID) | ORAL | Status: DC | PRN

## 2016-03-13 MED ORDER — ASPIRIN EC 81 MG PO TBEC
81.0000 mg | DELAYED_RELEASE_TABLET | Freq: Every day | ORAL | Status: DC
Start: 2016-03-13 — End: 2016-03-15
  Administered 2016-03-13 – 2016-03-15 (×3): 81 mg via ORAL
  Filled 2016-03-13 (×3): qty 1

## 2016-03-13 MED ORDER — TICAGRELOR 90 MG PO TABS
90.0000 mg | ORAL_TABLET | Freq: Two times a day (BID) | ORAL | Status: DC
  Administered 2016-03-13 – 2016-03-15 (×5): 90 mg via ORAL
  Filled 2016-03-13 (×5): qty 1

## 2016-03-13 MED ORDER — ATORVASTATIN CALCIUM 80 MG PO TABS
80.0000 mg | ORAL_TABLET | Freq: Every day | ORAL | Status: DC
  Administered 2016-03-13 – 2016-03-14 (×2): 80 mg via ORAL
  Filled 2016-03-13 (×2): qty 1

## 2016-03-13 MED ORDER — DIPHENHYDRAMINE HCL 25 MG PO CAPS
50.0000 mg | ORAL_CAPSULE | Freq: Four times a day (QID) | ORAL | Status: DC
  Administered 2016-03-13 – 2016-03-15 (×6): 50 mg via ORAL
  Filled 2016-03-13 (×7): qty 2

## 2016-03-13 MED ORDER — NITROGLYCERIN 0.4 MG SL SUBL: 0.4000 mg | SUBLINGUAL_TABLET | SUBLINGUAL | Status: DC | PRN

## 2016-03-13 MED ORDER — ARIPIPRAZOLE 5 MG PO TABS
5.0000 mg | ORAL_TABLET | Freq: Every day | ORAL | Status: DC
  Administered 2016-03-13 – 2016-03-14 (×2): 5 mg via ORAL
  Filled 2016-03-13 (×2): qty 1

## 2016-03-13 NOTE — ED Notes (Signed)
Urine cup given to pt with request for sample. 

## 2016-03-13 NOTE — ED Notes (Signed)
Introduced self to patient. Pt oriented to unit expectations.  Assessed pt for:  A) Anxiety &/or agitation: Affect flat, mood depressed, behavior calm and cooperative. Pt states that he has been feeling suicidal and depressed, but feels ok physically.   S) Safety: Safety maintained with q-15-minute checks and hourly rounds by staff.  A) ADLs: Pt able to perform ADLs independently.  P) Pick-Up (room cleanliness): Pt's room clean and free of clutter.

## 2016-03-13 NOTE — BH Assessment (Signed)
Assessment complete. Consulted with Assunta FoundShuvon Rankin, NP who recommends inpatient treatment.   Davina PokeJoVea Missael Ferrari, LCSW Therapeutic Triage Specialist [facility] 03/13/2016 5:33 PM

## 2016-03-13 NOTE — ED Provider Notes (Signed)
[facility]-EMERGENCY DEPT Provider Note   CSN: [id number] Arrival date & time: 03/13/16  1244  By signing my name below, I, Sonum Patel, attest that this documentation has been prepared under the direction and in the presence of Newell RubbermaidJeffrey Kayliana Codd, PA-C. Electronically Signed: Sonum Patel, Neurosurgeoncribe. 03/13/16. 2:34 PM.  History   Chief Complaint Chief Complaint  Patient presents with  . Suicidal   The history is provided by the patient. No language interpreter was used.    HPI Comments:   [name] is a 41 y.o. male who presents to the Emergency Department today with SI and a plan. He has a history of bipolar disorder, schizophrenia as well as cardiac disease, HTN, HLD and has not taken any of his medications for the last 7 months. He planned on jumping in front of the 7pm train that comes nearby his home today. He states he understands that he may be feeling this way because he has not had any of his medications so he is requesting not to be released today otherwise he knows he will follow though with his plan. He has attempted to commit suicide 3 times before. He denies current CP, SOB, abdominal pain, or any other physical complaints at this time.     Past Medical History:  Diagnosis Date  . Anxiety   . Bipolar 1 disorder (HCC)   . CAD S/P percutaneous coronary angioplasty    a. Inf STEMI s/p BMS to RCA 01/2007. b. NSTEMI 07/2013 s/p PTCA to RCA for ISR; c. Transient inferior ST elevation (peak Ti 0.25) 01/2014 s/p PTCA/DES to pRCA, PTCA/DES to dRCA, EF 50%; d. 12/2014 Inf STEMI: RCA patent prox stent, 11538m/d (3.0x32 Synergy DES), EF 35-45; e. 05/2015 Inf STEMI/Cath: LM nl, LAD 10ost/m, D1 75, LCX nl, OM1 25, OM2 30, RCA patent stents, RPDA 30ost.  . Hx of medication noncompliance   . Hyperlipidemia   . Hypertensive heart disease   . Ischemic cardiomyopathy    a. EF 40% in 2011, 60% in 2012. b. EF 55% by cath 07/2013. c. EF 50% by cath 01/2014; d. 12/2014 EF 35-45% by LV gram; e. 05/2015 Echo:  EF 50-55% inflat/inf AK, mild AI; f. 06/2015 cMRI: EF 49%, basal & mid inf full thickness scar, subendocardial scar in antsept and antlat wall, correlating w/ D1 dzs.  Marland Kitchen. NSVT (nonsustained ventricular tachycardia) (HCC)    a. Very brief run during 07/2013 admit for NSTEMI felt due to MI.  . Polysubstance abuse    a. h/o tobacco, marijuana and crack cocaine use. b. 07/2013: +UDS THC, neg for cocaine.  . Tobacco abuse     Patient Active Problem List   Diagnosis Date Noted  . Malnutrition of moderate degree 09/28/2015  . Hyperlipidemia   . Schizoaffective disorder, bipolar type (HCC) 03/06/2015  . Nausea with vomiting 03/06/2015  . Hypertensive heart disease 01/11/2015  . ST elevation myocardial infarction (STEMI) of inferior wall (HCC) 01/10/2015  . ST elevation myocardial infarction (STEMI) of inferior wall, initial episode of care (HCC) 01/10/2015  . Medical non-compliance   . ST elevation myocardial infarction involving right coronary artery (HCC) 01/15/2014  . Hx of medication noncompliance   . NSVT (nonsustained ventricular tachycardia) (HCC)   . Ischemic cardiomyopathy   . Tobacco abuse   . Anxiety   . 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 DES PCI to RCA x 2 (3.0 mm x 22 mm Resolute - distal & proximal RCA) 01/14/2014    Class: Present on Admission  .  Non-STEMI (non-ST elevated myocardial infarction) (HCC) 07/22/2013  . Atypical chest pain 07/21/2013  . Bipolar 1 disorder, depressed (HCC) 07/21/2013  . Chest pain 04/16/2013  . HLD (hyperlipidemia) 04/16/2013  . Hyperlipidemia with target LDL less than 70 01/27/2009  . SUBSTANCE ABUSE 01/27/2009  . DEPRESSION 01/27/2009  . Essential hypertension 01/27/2009  . CAD S/P BMS PCI to RCA with PTCA for ISR --> followed by stent thrombosis - DES PCI 01/27/2009  . GASTRITIS 01/27/2009    Past Surgical History:  Procedure Laterality Date  . CARDIAC CATHETERIZATION  01/16/2009   normal left main, Cfx with 2-OMs both w/minor  irregularities, LAd with 20-30% mid region irregularities, ramus intermediate/optional diagonal with 60% osital narrowing, RCA with stent in distal portion w/20% prox in-stent stenosis (Dr. Mervyn Skeeters. Little)  . CARDIAC CATHETERIZATION  07/23/2013   two vessel obstructive CAD, occluded first diagonal, focal in-stent restenosis in distal RCA (Dr. Peter Swaziland)  . CARDIAC CATHETERIZATION N/A 01/10/2015   Procedure: Left Heart Cath and Coronary Angiography;  Surgeon: Marykay Lex, MD;  Location[facility] INVASIVE CV LAB;  Service: Cardiovascular;  Laterality: N/A;  . CARDIAC CATHETERIZATION N/A 01/10/2015   Procedure: Coronary Stent Intervention;  Surgeon: Marykay Lex, MD;  Location: [facility] INVASIVE CV LAB;  Service: Cardiovascular;  Laterality: N/A;  . CARDIAC CATHETERIZATION N/A 06/10/2015   Procedure: Left Heart Cath and Coronary Angiography;  Surgeon: Peter M Swaziland, MD;  Location: [facility] INVASIVE CV LAB;  Service: Cardiovascular;  Laterality: N/A;  . CARDIAC CATHETERIZATION N/A 09/27/2015   Procedure: Left Heart Cath and Coronary Angiography;  Surgeon: Corky Crafts, MD;  Location: [facility] INVASIVE CV LAB;  Service: Cardiovascular;  Laterality: N/A;  . CARDIAC CATHETERIZATION N/A 09/27/2015   Procedure: Coronary Balloon Angioplasty;  Surgeon: Corky Crafts, MD;  Location: [facility] INVASIVE CV LAB;  Service: Cardiovascular;  Laterality: N/A;  . CORONARY ANGIOPLASTY WITH STENT PLACEMENT  02/05/2007   3.5x20mm Quantum non-DES to RCA (Dr. Nicki Guadalajara)  . FEMORAL ARTERY STENT    . LEFT AND RIGHT HEART CATHETERIZATION WITH CORONARY ANGIOGRAM N/A 01/14/2014   Procedure: LEFT AND RIGHT HEART CATHETERIZATION WITH CORONARY ANGIOGRAM;  Surgeon: Kathleene Hazel, MD;  Location: [facility] CATH LAB;  Service: Cardiovascular;  Laterality: N/A;  . LEFT HEART CATHETERIZATION WITH CORONARY ANGIOGRAM N/A 07/23/2013   Procedure: LEFT HEART CATHETERIZATION WITH CORONARY ANGIOGRAM;  Surgeon: Peter M Swaziland, MD;  Location: [facility] CATH LAB;   Service: Cardiovascular;  Laterality: N/A;       Home Medications    Prior to Admission medications   Medication Sig Start Date End Date Taking? Authorizing Provider  aspirin EC 81 MG EC tablet Take 1 tablet (81 mg total) by mouth daily. 03/11/15  Yes Adonis Brook, NP  atorvastatin (LIPITOR) 80 MG tablet Take 1 tablet (80 mg total) by mouth daily at 6 PM. Patient not taking: Reported on 03/13/2016 09/30/15   Leone Brand, NP  diphenhydrAMINE (BENADRYL) 50 MG capsule Take 1 capsule (50 mg total) by mouth every 6 (six) hours. Patient not taking: Reported on 03/13/2016 09/30/15   Leone Brand, NP  famotidine (PEPCID) 20 MG tablet Take 1 tablet (20 mg total) by mouth 2 (two) times daily. Patient not taking: Reported on 03/13/2016 11/08/15   Daryl F de Villier II, PA  lisinopril (PRINIVIL,ZESTRIL) 5 MG tablet Take 1 tablet (5 mg total) by mouth daily. Patient not taking: Reported on 03/13/2016 11/25/15   Henrietta Hoover, NP  loperamide (IMODIUM) 2 MG capsule Take 1 capsule (  2 mg total) by mouth 4 (four) times daily as needed for diarrhea or loose stools. Patient not taking: Reported on 03/13/2016 11/08/15   Daryl F de Villier II, PA  metoprolol succinate (TOPROL-XL) 25 MG 24 hr tablet Take 1 tablet (25 mg total) by mouth daily. Patient not taking: Reported on 03/13/2016 11/25/15   Henrietta Hoover, NP  Multiple Vitamin (MULTIVITAMIN WITH MINERALS) TABS tablet Take 1 tablet by mouth daily. Patient not taking: Reported on 03/13/2016 10/01/15   Leone Brand, NP  nitroGLYCERIN (NITROSTAT) 0.4 MG SL tablet Place 1 tablet (0.4 mg total) under the tongue every 5 (five) minutes x 3 doses as needed for chest pain. Patient not taking: Reported on 03/13/2016 06/11/15   Azalee Course, PA  promethazine (PHENERGAN) 25 MG tablet Take 1 tablet (25 mg total) by mouth every 6 (six) hours as needed for nausea or vomiting. Patient not taking: Reported on 03/13/2016 11/08/15   Daryl F de Villier II, PA  ticagrelor (BRILINTA) 90  MG TABS tablet Take 1 tablet (90 mg total) by mouth 2 (two) times daily. Patient not taking: Reported on 03/13/2016 09/30/15   Leone Brand, NP  traZODone (DESYREL) 100 MG tablet Take 1 tablet (100 mg total) by mouth at bedtime as needed for sleep. Patient not taking: Reported on 03/13/2016 03/11/15   Adonis Brook, NP    Family History Family History  Problem Relation Age of Onset  . CAD Mother   . Heart disease Mother   . Heart attack Mother   . Schizophrenia Mother   . CAD Sister     Social History Social History  Substance Use Topics  . Smoking status: Current Every Day Smoker    Packs/day: 0.25    Years: 30.00    Types: Cigarettes  . Smokeless tobacco: Never Used  . Alcohol use 0.0 oz/week     Comment: 80 oz of beer weekly     Allergies   Iodine; Shellfish allergy; and Contrast media [iodinated diagnostic agents]   Review of Systems Review of Systems  Constitutional: Negative for fever.  Respiratory: Negative for shortness of breath.   Cardiovascular: Negative for chest pain.  Gastrointestinal: Negative for abdominal pain.  Psychiatric/Behavioral: Positive for suicidal ideas.  All other systems reviewed and are negative.    Physical Exam Updated Vital Signs BP 132/93 (BP Location: Right Arm)   Pulse 63   Temp 98.2 F (36.8 C) (Oral)   Resp 18   SpO2 100%   Physical Exam  Constitutional: He is oriented to person, place, and time. He appears well-developed and well-nourished.  HENT:  Head: Normocephalic and atraumatic.  Cardiovascular: Normal rate, regular rhythm and normal heart sounds.   Pulmonary/Chest: Effort normal and breath sounds normal. No respiratory distress. He has no wheezes. He has no rales.  Abdominal: Soft. There is no tenderness.  Neurological: He is alert and oriented to person, place, and time.  Skin: Skin is warm and dry.  Psychiatric: He has a normal mood and affect.  Nursing note and vitals reviewed.    ED Treatments / Results    DIAGNOSTIC STUDIES: Oxygen Saturation is 100% on RA, normal by my interpretation.    COORDINATION OF CARE: 1:43 PM Discussed treatment plan with pt at bedside and pt agreed to plan.    Labs (all labs ordered are listed, but only abnormal results are displayed) Labs Reviewed  COMPREHENSIVE METABOLIC PANEL - Abnormal; Notable for the following:       Result Value  Sodium 134 (*)    Chloride 99 (*)    Glucose, Bld 104 (*)    BUN <5 (*)    All other components within normal limits  ACETAMINOPHEN LEVEL - Abnormal; Notable for the following:    Acetaminophen (Tylenol), Serum <10 (*)    All other components within normal limits  CBC - Abnormal; Notable for the following:    RDW 16.7 (*)    All other components within normal limits  ETHANOL  SALICYLATE LEVEL  RAPID URINE DRUG SCREEN, HOSP PERFORMED    EKG  EKG Interpretation None       Radiology No results found.  Procedures Procedures (including critical care time)  Medications Ordered in ED Medications  ARIPiprazole (ABILIFY) tablet 5 mg (not administered)  aspirin EC tablet 81 mg (not administered)  atorvastatin (LIPITOR) tablet 80 mg (not administered)  diphenhydrAMINE (BENADRYL) capsule 50 mg (not administered)  famotidine (PEPCID) tablet 20 mg (not administered)  lisinopril (PRINIVIL,ZESTRIL) tablet 5 mg (not administered)  loperamide (IMODIUM) capsule 2 mg (not administered)  metoprolol succinate (TOPROL-XL) 24 hr tablet 25 mg (not administered)  nitroGLYCERIN (NITROSTAT) SL tablet 0.4 mg (not administered)  promethazine (PHENERGAN) tablet 25 mg (not administered)  ticagrelor (BRILINTA) tablet 90 mg (not administered)  traZODone (DESYREL) tablet 100 mg (not administered)     Initial Impression / Assessment and Plan / ED Course  I have reviewed the triage vital signs and the nursing notes.  Pertinent labs & imaging results that were available during my care of the patient were reviewed by me and  considered in my medical decision making (see chart for details).      Final Clinical Impressions(s) / ED Diagnoses   Final diagnoses:  Suicidal ideation    41 year old male presents today with suicidal ideations.  Patient has a specific plan with very specific details.  Patient reports he has not been on any of his medication including blood pressure medication, antipsychotics.  Due to patient's suicidal ideations with such a specific plan patient will need TTS consultation.  Patient was initially hypertensive, repeat blood pressure down within a regional range.  His hypertension is likely secondary to noncompliance with medications.  Patient does have a significant cardiac history, he denies any specific cardiac complaints at my evaluation.  Patient will need to be restarted on his home meds, his home meds have been reordered.  Awaiting TTS evaluation.   New Prescriptions New Prescriptions   No medications on file   I personally performed the services described in this documentation, which was scribed in my presence. The recorded information has been reviewed and is accurate.   Eyvonne Mechanic, PA-C 03/13/16 1435    Linwood Dibbles, MD 03/14/16 [phone]
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
## 2016-03-13 NOTE — ED Notes (Signed)
Introduced self to patient. Pt oriented to unit expectations.  Assessed pt for:  A) Anxiety &/or agitation: On admission to the [facility] pt is calm and cooperative. He indicates that he has been depressed and feeling suicidal.   S) Safety: Safety maintained with q-15-minute checks and hourly rounds by staff.  A) ADLs: Pt able to perform ADLs independently.  P) Pick-Up (room cleanliness): .batcl

## 2016-03-13 NOTE — ED Triage Notes (Signed)
Pt is very weepy and stated that he has been off his medications for a long lime. He clearly stated that he may not be here at 7pm. His plan is to step in front of the 7pm train. He is having thoughts of harming himself and his girlfriend. He remains tearful but alert, oriented and cooperative.He is requesting to talk to someone before he hurts himself.

## 2016-03-14 ENCOUNTER — Encounter (HOSPITAL_COMMUNITY): Payer: Self-pay | Admitting: Registered Nurse

## 2016-03-14 DIAGNOSIS — Z79899 Other long term (current) drug therapy: Secondary | ICD-10-CM | POA: Diagnosis not present

## 2016-03-14 DIAGNOSIS — Z7982 Long term (current) use of aspirin: Secondary | ICD-10-CM

## 2016-03-14 DIAGNOSIS — F17210 Nicotine dependence, cigarettes, uncomplicated: Secondary | ICD-10-CM | POA: Diagnosis not present

## 2016-03-14 DIAGNOSIS — F1220 Cannabis dependence, uncomplicated: Secondary | ICD-10-CM | POA: Diagnosis present

## 2016-03-14 DIAGNOSIS — Z818 Family history of other mental and behavioral disorders: Secondary | ICD-10-CM | POA: Diagnosis not present

## 2016-03-14 DIAGNOSIS — F250 Schizoaffective disorder, bipolar type: Secondary | ICD-10-CM | POA: Diagnosis not present

## 2016-03-14 MED ORDER — NICOTINE 21 MG/24HR TD PT24
21.0000 mg | MEDICATED_PATCH | Freq: Every day | TRANSDERMAL | Status: DC
  Administered 2016-03-14: 21 mg via TRANSDERMAL
  Filled 2016-03-14 (×2): qty 1

## 2016-03-14 MED ORDER — ARIPIPRAZOLE 5 MG PO TABS
5.0000 mg | ORAL_TABLET | Freq: Two times a day (BID) | ORAL | Status: DC
  Administered 2016-03-14 – 2016-03-15 (×2): 5 mg via ORAL
  Filled 2016-03-14 (×2): qty 1

## 2016-03-14 NOTE — ED Notes (Signed)
SBAR Report received from previous nurse. Pt received calm and visible on unit. Pt denies current SI/ HI, A/V H, depression, anxiety, or pain at this time, and appears otherwise stable and free of distress. Pt reminded of camera surveillance, q 15 min rounds, and rules of the milieu. Will continue to assess. 

## 2016-03-14 NOTE — ED Notes (Signed)
Patient has been quiet and in his room most of the day.  He has been very pleasant.  This morning he stated he really wanted in-patient for a few days.  By this afternoon he stated that he was feeling much better since starting back on his medicines and would really like to go home and follow up with outpatient.  Appetite is decreased, but he is taking fluids well.  Tolerating medications well.

## 2016-03-14 NOTE — Consult Note (Signed)
[facility] Consult   Reason for Consult:  Suicidal ideation Referring Physician:  EDP Patient Identification: [name] MRN:  [id number] Principal Diagnosis: Schizoaffective disorder, bipolar type [facility]) Diagnosis:   Patient Active Problem List   Diagnosis Date Noted  . Tetrahydrocannabinol (THC) use disorder, severe, dependence ([place]) [F12.20] 03/14/2016  . Malnutrition of moderate degree [E44.0] 09/28/2015  . Hyperlipidemia [E78.5]   . Schizoaffective disorder, bipolar type ([place]) [F25.0] 03/06/2015  . Nausea with vomiting [R11.2] 03/06/2015  . Hypertensive heart disease [I11.9] 01/11/2015  . ST elevation myocardial infarction (STEMI) of inferior wall ([place]) [I21.19] 01/10/2015  . ST elevation myocardial infarction (STEMI) of inferior wall, initial episode of care ([place]) [I21.19] 01/10/2015  . Medical non-compliance [Z91.19]   . ST elevation myocardial infarction involving right coronary artery ([place]) [I21.11] 01/15/2014  . Hx of medication noncompliance [Z91.14]   . NSVT (nonsustained ventricular tachycardia) ([place]) [I47.2]   . Ischemic cardiomyopathy [I25.5]   . Tobacco abuse [Z72.0]   . Anxiety [F41.9]   . 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 DES PCI to RCA x 2 (3.0 mm x 22 mm Resolute - distal & proximal RCA) [Z95.5] 01/14/2014    Class: Present on Admission  . Non-STEMI (non-ST elevated myocardial infarction) ([place]) [I21.4] 07/22/2013  . Atypical chest pain [R07.89] 07/21/2013  . Bipolar 1 disorder, depressed ([place]) [F31.9] 07/21/2013  . Chest pain [R07.9] 04/16/2013  . HLD (hyperlipidemia) [E78.5] 04/16/2013  . Hyperlipidemia with target LDL less than 70 [E78.5] 01/27/2009  . SUBSTANCE ABUSE [F19.10] 01/27/2009  . DEPRESSION [F32.9] 01/27/2009  . Essential hypertension [I10] 01/27/2009  . CAD S/P BMS PCI to RCA with PTCA for ISR --> followed by stent thrombosis - DES PCI [I25.10, Z98.61] 01/27/2009  . GASTRITIS [K29.70, K29.90] 01/27/2009     Total Time spent with patient: 45 minutes  Subjective:   [name] is a 41 y.o. male patient [facility] with complaints of worsening depression and suicidal ideation .  HPI:  Patient states that he got into an argument with his girl friend and was having thoughts of jumping in front of [employer] train  Past Psychiatric History: Schizoaffective disorder, bipolar type; Substance abuse, Major depression   Risk to Self: Suicidal Ideation: Yes-Currently Present (for past three weeks ) Suicidal Intent: Yes-Currently Present Is patient at risk for suicide?: Yes Suicidal Plan?: Yes-Currently Present Specify Current Suicidal Plan: walk in front of a train that comes close to his home Access to Means: Yes Specify Access to Suicidal Means: yes What has been your use of drugs/alcohol within the last 12 months?: THC daily  How many times?: 3 (about three years ago walked in front of a bus, bus stopped) Other Self Harm Risks: Denies Triggers for Past Attempts: Other (Comment) (not taking medications) Intentional Self Injurious Behavior: None Risk to Others: Homicidal Ideation: Yes-Currently Present Thoughts of Harm to Others: Yes-Currently Present Comment - Thoughts of Harm to Others: voices tell him to kill his girlfriend  Current Homicidal Intent: Yes-Currently Present Current Homicidal Plan: No Access to Homicidal Means: No Identified Victim: girlfriend History of harm to others?: No Assessment of Violence: None Noted Violent Behavior Description: Denies Does patient have access to weapons?: No Criminal Charges Pending?: No Does patient have a court date: No Prior Inpatient Therapy: Prior Inpatient Therapy: Yes Prior Therapy Dates: 2015, 2017 Prior Therapy Facilty/Provider(s): [facility], [facility] Reason for Treatment: Schizoaffective Disorder  Prior Outpatient Therapy: Prior Outpatient Therapy: No Prior Therapy Dates: N/A Prior Therapy Facilty/Provider(s): N/A Reason for Treatment:  N/A Does patient have an ACCT team?: No Does patient have Intensive In-House Services?  : No Does patient have [facility] services? : No Does patient have P4CC services?: No  Past Medical History:  Past Medical History:  Diagnosis Date  . Anxiety   . Bipolar 1 disorder ([place])   . CAD S/P percutaneous coronary angioplasty    a. Inf STEMI s/p BMS to RCA 01/2007. b. NSTEMI 07/2013 s/p PTCA to RCA for ISR; c. Transient inferior ST elevation (peak Ti 0.25) 01/2014 s/p PTCA/DES to pRCA, PTCA/DES to dRCA, EF 50%; d. 12/2014 Inf STEMI: RCA patent prox stent, 138md (3.0x32 Synergy DES), EF 35-45; e. 05/2015 Inf STEMI/Cath: LM nl, LAD 10ost/m, D1 75, LCX nl, OM1 25, OM2 30, RCA patent stents, RPDA 30ost.  . Hx of medication noncompliance   . Hyperlipidemia   . Hypertensive heart disease   . Ischemic cardiomyopathy    a. EF 40% in 2011, 60% in 2012. b. EF 55% by cath 07/2013. c. EF 50% by cath 01/2014; d. 12/2014 EF 35-45% by LV gram; e. 05/2015 Echo: EF 50-55% inflat/inf AK, mild AI; f. 06/2015 cMRI: EF 49%, basal & mid inf full thickness scar, subendocardial scar in antsept and antlat wall, correlating w/ D1 dzs.  .Marland KitchenNSVT (nonsustained ventricular tachycardia) ([place]    a. Very brief run during 07/2013 admit for NSTEMI felt due to MI.  . Polysubstance abuse    a. h/o tobacco, marijuana and crack cocaine use. b. 07/2013: +UDS THC, neg for cocaine.  . Tobacco abuse     Past Surgical History:  Procedure Laterality Date  . CARDIAC CATHETERIZATION  01/16/2009   normal left main, Cfx with 2-OMs both w/minor irregularities, LAd with 20-30% mid region irregularities, ramus intermediate/optional diagonal with 60% osital narrowing, RCA with stent in distal portion w/20% prox in-stent stenosis (Dr. ALoni Muse Little)  . CARDIAC CATHETERIZATION  07/23/2013   two vessel obstructive CAD, occluded first diagonal, focal in-stent restenosis in distal RCA (Dr. Peter JMartinique  . CARDIAC CATHETERIZATION N/A 01/10/2015   Procedure: Left  Heart Cath and Coronary Angiography;  Surgeon: DLeonie Man MD;  [facility];  Service: Cardiovascular;  Laterality: N/A;  . CARDIAC CATHETERIZATION N/A 01/10/2015   Procedure: Coronary Stent Intervention;  Surgeon: DLeonie Man MD;  [facility];  Service: Cardiovascular;  Laterality: N/A;  . CARDIAC CATHETERIZATION N/A 06/10/2015   Procedure: Left Heart Cath and Coronary Angiography;  Surgeon: Peter M JMartinique MD;  [facility];  Service: Cardiovascular;  Laterality: N/A;  . CARDIAC CATHETERIZATION N/A 09/27/2015   Procedure: Left Heart Cath and Coronary Angiography;  Surgeon: JJettie Booze MD;  [facility];  Service: Cardiovascular;  Laterality: N/A;  . CARDIAC CATHETERIZATION N/A 09/27/2015   Procedure: Coronary Balloon Angioplasty;  Surgeon: JJettie Booze MD;  [facility];  Service: Cardiovascular;  Laterality: N/A;  . CORONARY ANGIOPLASTY WITH STENT PLACEMENT  02/05/2007   3.5x114mQuantum non-DES to RCA (Dr. ThShelva Majestic . FEMORAL ARTERY STENT    . LEFT AND RIGHT HEART CATHETERIZATION WITH CORONARY ANGIOGRAM N/A 01/14/2014   Procedure: LEFT AND RIGHT HEART CATHETERIZATION WITH CORONARY ANGIOGRAM;  Surgeon: ChBurnell BlanksMD;  [facility];  Service: Cardiovascular;  Laterality: N/A;  . LEFT HEART CATHETERIZATION WITH CORONARY ANGIOGRAM N/A 07/23/2013   Procedure: LEFT HEART CATHETERIZATION WITH CORONARY ANGIOGRAM;  Surgeon: Peter M JoMartiniqueMD;  [facility];  Service:  Cardiovascular;  Laterality: N/A;   Family History:  Family History  Problem Relation Age of Onset  . CAD Mother   . Heart disease Mother   . Heart attack Mother   . Schizophrenia Mother   . CAD Sister    Family Psychiatric  History: None Social History:  History  Alcohol Use  . 0.0 oz/week    Comment: 80 oz of beer weekly     History  Drug Use No    Comment: Marijuana (last used on  01/07/2015)    Social History   Social History  . Marital status: Single    Spouse name: N/A  . Number of children: N/A  . Years of education: N/A   Social History Main Topics  . Smoking status: Current Every Day Smoker    Packs/day: 0.25    Years: 30.00    Types: Cigarettes  . Smokeless tobacco: Never Used  . Alcohol use 0.0 oz/week     Comment: 80 oz of beer weekly  . Drug use: No     Comment: Marijuana (last used on 01/07/2015)  . Sexual activity: Not Currently   Other Topics Concern  . None   Social History Narrative  . None   Additional Social History:    Allergies:   Allergies  Allergen Reactions  . Iodine Anaphylaxis and Swelling  . Shellfish Allergy Anaphylaxis    All shellfish  . Contrast Media [Iodinated Diagnostic Agents] Nausea And Vomiting    Labs:  Results for orders placed or performed during the hospital encounter of 03/13/16 (from the past 48 hour(s))  Comprehensive metabolic panel     Status: Abnormal   Collection Time: 03/13/16  1:30 PM  Result Value Ref Range   Sodium 134 (L) 135 - 145 mmol/L   Potassium 3.5 3.5 - 5.1 mmol/L   Chloride 99 (L) 101 - 111 mmol/L   CO2 24 22 - 32 mmol/L   Glucose, Bld 104 (H) 65 - 99 mg/dL   BUN <5 (L) 6 - 20 mg/dL   Creatinine, Ser 1.16 0.61 - 1.24 mg/dL   Calcium 9.4 8.9 - 10.3 mg/dL   Total Protein 7.9 6.5 - 8.1 g/dL   Albumin 4.3 3.5 - 5.0 g/dL   AST 22 15 - 41 U/L   ALT 20 17 - 63 U/L   Alkaline Phosphatase 81 38 - 126 U/L   Total Bilirubin 0.9 0.3 - 1.2 mg/dL   GFR calc non Af Amer >60 >60 mL/min   GFR calc Af Amer >60 >60 mL/min    Comment: (NOTE) The eGFR has been calculated using the CKD EPI equation. This calculation has not been validated in all clinical situations. eGFR's persistently <60 mL/min signify possible Chronic Kidney Disease.    Anion gap 11 5 - 15  Ethanol     Status: None   Collection Time: 03/13/16  1:30 PM  Result Value Ref Range   Alcohol, Ethyl (B) <5 <5 mg/dL     Comment:        LOWEST DETECTABLE LIMIT FOR SERUM ALCOHOL IS 5 mg/dL FOR MEDICAL PURPOSES ONLY   Salicylate level     Status: None   Collection Time: 03/13/16  1:30 PM  Result Value Ref Range   Salicylate Lvl <4.0 2.8 - 30.0 mg/dL  Acetaminophen level     Status: Abnormal   Collection Time: 03/13/16  1:30 PM  Result Value Ref Range   Acetaminophen (Tylenol), Serum <10 (L) 10 - 30 ug/mL  Comment:        THERAPEUTIC CONCENTRATIONS VARY SIGNIFICANTLY. A RANGE OF 10-30 ug/mL MAY BE AN EFFECTIVE CONCENTRATION FOR MANY PATIENTS. HOWEVER, SOME ARE BEST TREATED AT CONCENTRATIONS OUTSIDE THIS RANGE. ACETAMINOPHEN CONCENTRATIONS >150 ug/mL AT 4 HOURS AFTER INGESTION AND >50 ug/mL AT 12 HOURS AFTER INGESTION ARE OFTEN ASSOCIATED WITH TOXIC REACTIONS.   cbc     Status: Abnormal   Collection Time: 03/13/16  1:30 PM  Result Value Ref Range   WBC 5.0 4.0 - 10.5 K/uL   RBC 4.84 4.22 - 5.81 MIL/uL   Hemoglobin 14.9 13.0 - 17.0 g/dL   HCT 42.8 39.0 - 52.0 %   MCV 88.4 78.0 - 100.0 fL   MCH 30.8 26.0 - 34.0 pg   MCHC 34.8 30.0 - 36.0 g/dL   RDW 16.7 (H) 11.5 - 15.5 %   Platelets 230 150 - 400 K/uL  Rapid urine drug screen (hospital performed)     Status: Abnormal   Collection Time: 03/13/16  3:08 PM  Result Value Ref Range   Opiates NONE DETECTED NONE DETECTED   Cocaine NONE DETECTED NONE DETECTED   Benzodiazepines NONE DETECTED NONE DETECTED   Amphetamines NONE DETECTED NONE DETECTED   Tetrahydrocannabinol POSITIVE (A) NONE DETECTED   Barbiturates NONE DETECTED NONE DETECTED    Comment:        DRUG SCREEN FOR MEDICAL PURPOSES ONLY.  IF CONFIRMATION IS NEEDED FOR ANY PURPOSE, NOTIFY LAB WITHIN 5 DAYS.        LOWEST DETECTABLE LIMITS FOR URINE DRUG SCREEN Drug Class       Cutoff (ng/mL) Amphetamine      1000 Barbiturate      200 Benzodiazepine   865 Tricyclics       784 Opiates          300 Cocaine          300 THC              50     Current Facility-Administered  Medications  Medication Dose Route Frequency Provider Last Rate Last Dose  . ARIPiprazole (ABILIFY) tablet 5 mg  5 mg Oral BID Norman Clay, MD      . aspirin EC tablet 81 mg  81 mg Oral Daily [employer], PA-C   81 mg at 03/14/16 0919  . atorvastatin (LIPITOR) tablet 80 mg  80 mg Oral q1800 Okey Regal, PA-C   80 mg at 03/13/16 1756  . diphenhydrAMINE (BENADRYL) capsule 50 mg  50 mg Oral Q6H Jeffrey Hedges, PA-C   50 mg at 03/14/16 0920  . famotidine (PEPCID) tablet 20 mg  20 mg Oral BID Okey Regal, PA-C   20 mg at 03/14/16 0919  . lisinopril (PRINIVIL,ZESTRIL) tablet 5 mg  5 mg Oral Daily Okey Regal, PA-C   5 mg at 03/14/16 0919  . loperamide (IMODIUM) capsule 2 mg  2 mg Oral QID PRN Okey Regal, PA-C      . metoprolol succinate (TOPROL-XL) 24 hr tablet 25 mg  25 mg Oral Daily [employer], PA-C   25 mg at 03/14/16 0919  . nicotine (NICODERM CQ - dosed in mg/24 hours) patch 21 mg  21 mg Transdermal Daily Marget Outten B Aden Youngman, NP   21 mg at 03/14/16 0920  . nitroGLYCERIN (NITROSTAT) SL tablet 0.4 mg  0.4 mg Sublingual Q5 Min x 3 PRN Okey Regal, PA-C      . promethazine (PHENERGAN) tablet 25 mg  25 mg Oral Q6H PRN Okey Regal, PA-C      .  ticagrelor (BRILINTA) tablet 90 mg  90 mg Oral BID Okey Regal, PA-C   90 mg at 03/14/16 0919  . traZODone (DESYREL) tablet 100 mg  100 mg Oral QHS PRN Okey Regal, PA-C       Current Outpatient Prescriptions  Medication Sig Dispense Refill  . aspirin EC 81 MG EC tablet Take 1 tablet (81 mg total) by mouth daily. 30 tablet 0  . atorvastatin (LIPITOR) 80 MG tablet Take 1 tablet (80 mg total) by mouth daily at 6 PM. (Patient not taking: Reported on 03/13/2016) 30 tablet 6  . diphenhydrAMINE (BENADRYL) 50 MG capsule Take 1 capsule (50 mg total) by mouth every 6 (six) hours. (Patient not taking: Reported on 03/13/2016) 30 capsule 0  . famotidine (PEPCID) 20 MG tablet Take 1 tablet (20 mg total) by mouth 2 (two) times daily. (Patient not  taking: Reported on 03/13/2016) 30 tablet 0  . lisinopril (PRINIVIL,ZESTRIL) 5 MG tablet Take 1 tablet (5 mg total) by mouth daily. (Patient not taking: Reported on 03/13/2016) 90 tablet 1  . loperamide (IMODIUM) 2 MG capsule Take 1 capsule (2 mg total) by mouth 4 (four) times daily as needed for diarrhea or loose stools. (Patient not taking: Reported on 03/13/2016) 12 capsule 0  . metoprolol succinate (TOPROL-XL) 25 MG 24 hr tablet Take 1 tablet (25 mg total) by mouth daily. (Patient not taking: Reported on 03/13/2016) 90 tablet 1  . Multiple Vitamin (MULTIVITAMIN WITH MINERALS) TABS tablet Take 1 tablet by mouth daily. (Patient not taking: Reported on 03/13/2016)    . nitroGLYCERIN (NITROSTAT) 0.4 MG SL tablet Place 1 tablet (0.4 mg total) under the tongue every 5 (five) minutes x 3 doses as needed for chest pain. (Patient not taking: Reported on 03/13/2016) 25 tablet 3  . promethazine (PHENERGAN) 25 MG tablet Take 1 tablet (25 mg total) by mouth every 6 (six) hours as needed for nausea or vomiting. (Patient not taking: Reported on 03/13/2016) 12 tablet 0  . ticagrelor (BRILINTA) 90 MG TABS tablet Take 1 tablet (90 mg total) by mouth 2 (two) times daily. (Patient not taking: Reported on 03/13/2016) 60 tablet 11  . traZODone (DESYREL) 100 MG tablet Take 1 tablet (100 mg total) by mouth at bedtime as needed for sleep. (Patient not taking: Reported on 03/13/2016) 30 tablet 0    Musculoskeletal: Strength & Muscle Tone: within normal limits Gait & Station: normal Patient leans: N/A  Psychiatric Specialty Exam: Physical Exam  Nursing note and vitals reviewed.   Review of Systems  All other systems reviewed and are negative.   Blood pressure 107/76, pulse 65, temperature 98.9 F (37.2 C), temperature source Oral, resp. rate 12, SpO2 100 %.There is no height or weight on file to calculate BMI.  General Appearance: Disheveled  Eye Contact:  Good  Speech:  Clear and Coherent and Normal Rate  Volume:  Normal   Mood:  Depressed  Affect:  Depressed  Thought Process:  Goal Directed  Orientation:  Full (Time, Place, and Person)  Thought Content:  Hallucinations: Auditory  Suicidal Thoughts:  Yes.  with intent/plan  Homicidal Thoughts:  No  Memory:  Immediate;   Fair Recent;   Fair Remote;   Fair  Judgement:  Poor  Insight:  Lacking  Psychomotor Activity:  Normal  Concentration:  Concentration: Fair and Attention Span: Fair  Recall:  [employer] of Knowledge:  Fair  Language:  Fair  Akathisia:  No  Handed:  Right  AIMS (if indicated):  Assets:  Communication Skills Desire for Improvement  ADL's:  Intact  Cognition:  WNL  Sleep:        Treatment Plan Summary: Daily contact with patient to assess and evaluate symptoms and progress in treatment and Medication management  Disposition: Recommend psychiatric Inpatient admission when medically cleared.  Chenel Wernli, NP 03/14/2016 1:50 PM

## 2016-03-14 NOTE — BH Assessment (Signed)
Pt has been referred to the following inpt facilities for possible admission: [address] [facility], [facility], [facility], [facility], [facility], [facility], [facility], [facility]   Princess BruinsAquicha Duff, MSW, [employer]CSWA

## 2016-03-15 DIAGNOSIS — Z91013 Allergy to seafood: Secondary | ICD-10-CM

## 2016-03-15 DIAGNOSIS — Z888 Allergy status to other drugs, medicaments and biological substances status: Secondary | ICD-10-CM

## 2016-03-15 DIAGNOSIS — F17210 Nicotine dependence, cigarettes, uncomplicated: Secondary | ICD-10-CM

## 2016-03-15 DIAGNOSIS — Z79899 Other long term (current) drug therapy: Secondary | ICD-10-CM

## 2016-03-15 DIAGNOSIS — R45851 Suicidal ideations: Secondary | ICD-10-CM

## 2016-03-15 DIAGNOSIS — R443 Hallucinations, unspecified: Secondary | ICD-10-CM

## 2016-03-15 DIAGNOSIS — F250 Schizoaffective disorder, bipolar type: Secondary | ICD-10-CM

## 2016-03-15 DIAGNOSIS — F1220 Cannabis dependence, uncomplicated: Secondary | ICD-10-CM

## 2016-03-15 DIAGNOSIS — Z818 Family history of other mental and behavioral disorders: Secondary | ICD-10-CM

## 2016-03-15 MED ORDER — TRAZODONE HCL 100 MG PO TABS: 100.0000 mg | ORAL_TABLET | Freq: Every evening | ORAL | 0 refills | Status: DC | PRN

## 2016-03-15 MED ORDER — ARIPIPRAZOLE 5 MG PO TABS: 5.0000 mg | ORAL_TABLET | Freq: Two times a day (BID) | ORAL | 0 refills | Status: DC

## 2016-03-15 MED ORDER — METOPROLOL SUCCINATE ER 25 MG PO TB24: 25.0000 mg | ORAL_TABLET | Freq: Every day | ORAL | 0 refills | Status: DC

## 2016-03-15 MED ORDER — LISINOPRIL 5 MG PO TABS: 5.0000 mg | ORAL_TABLET | Freq: Every day | ORAL | 0 refills | Status: DC

## 2016-03-15 MED ORDER — NITROGLYCERIN 0.4 MG SL SUBL: 0.4000 mg | SUBLINGUAL_TABLET | SUBLINGUAL | 0 refills | Status: DC | PRN

## 2016-03-15 MED ORDER — ASPIRIN 81 MG PO TBEC: 81.0000 mg | DELAYED_RELEASE_TABLET | Freq: Every day | ORAL | 0 refills | Status: DC

## 2016-03-15 MED ORDER — ATORVASTATIN CALCIUM 80 MG PO TABS: 80.0000 mg | ORAL_TABLET | Freq: Every day | ORAL | 0 refills | Status: DC

## 2016-03-15 MED ORDER — TICAGRELOR 90 MG PO TABS: 90.0000 mg | ORAL_TABLET | Freq: Two times a day (BID) | ORAL | 0 refills | Status: DC

## 2016-03-15 MED ORDER — FAMOTIDINE 20 MG PO TABS: 20.0000 mg | ORAL_TABLET | Freq: Two times a day (BID) | ORAL | 0 refills | Status: DC

## 2016-03-15 NOTE — Consult Note (Signed)
[facility] Consult   Reason for Consult:  [name] Referring Physician:  EDP [name] MRN:  [id number] Principal Diagnosis: Schizoaffective disorder, bipolar type [facility]) Diagnosis:   Justin Active Problem List   Diagnosis Date Noted  . Schizoaffective disorder, bipolar type ([place]) [F25.0] 03/06/2015    Priority: High  . Tetrahydrocannabinol (THC) use disorder, severe, dependence ([place]) [F12.20] 03/14/2016  . Malnutrition of moderate degree [E44.0] 09/28/2015  . Hyperlipidemia [E78.5]   . Nausea with vomiting [R11.2] 03/06/2015  . Hypertensive heart disease [I11.9] 01/11/2015  . ST elevation myocardial infarction (STEMI) of inferior wall ([place]) [I21.19] 01/10/2015  . ST elevation myocardial infarction (STEMI) of inferior wall, initial episode of care ([place]) [I21.19] 01/10/2015  . Medical non-compliance [Z91.19]   . ST elevation myocardial infarction involving right coronary artery ([place]) [I21.11] 01/15/2014  . Hx of medication noncompliance [Z91.14]   . NSVT (nonsustained ventricular tachycardia) ([place]) [I47.2]   . Ischemic cardiomyopathy [I25.5]   . Tobacco abuse [Z72.0]   . Anxiety [F41.9]   . 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 DES PCI to RCA x 2 (3.0 mm x 22 mm Resolute - distal & proximal RCA) [Z95.5] 01/14/2014    Class: Present on Admission  . Non-STEMI (non-ST elevated myocardial infarction) ([place]) [I21.4] 07/22/2013  . Atypical chest pain [R07.89] 07/21/2013  . Bipolar 1 disorder, depressed ([place]) [F31.9] 07/21/2013  . Chest pain [R07.9] 04/16/2013  . HLD (hyperlipidemia) [E78.5] 04/16/2013  . Hyperlipidemia with target LDL less than 70 [E78.5] 01/27/2009  . SUBSTANCE ABUSE [F19.10] 01/27/2009  . DEPRESSION [F32.9] 01/27/2009  . Essential hypertension [I10] 01/27/2009  . CAD S/P BMS PCI to RCA with PTCA for ISR --> followed by stent thrombosis - DES PCI [I25.10, Z98.61] 01/27/2009  .  GASTRITIS [K29.70, K29.90] 01/27/2009    Total Time spent with Justin: 45 minutes  Subjective:   [name] is a Justin y.o. Mills Justin does not warrant admission.  HPI:  [name] who presented to the ED with [name].  He was started on medications and stabilized quickly.  [name] has just gotten disability and excited he can afford his medications now.  He states he "feels good, no anxiety, no depression, nothing."  [name] wants to leave, sees Dr. Claiborne Billings for his cardiology issues.  Resources for mental health provided.  No suicidal/homicidal Mills, Justin, and alcohol/drug abuse.  STable for discharge.  Past Psychiatric History: schizoaffective disorder  Risk to Self: NOne Risk to Others: None Prior Inpatient Therapy: Prior Inpatient Therapy: Yes Prior Therapy Dates: 2015, 2017 Prior Therapy Facilty/Provider(s): [facility], [facility] Reason for Treatment: Schizoaffective Disorder  Prior Outpatient Therapy: Prior Outpatient Therapy: No Prior Therapy Dates: N/A Prior Therapy Facilty/Provider(s): N/A Reason for Treatment: N/A Does Justin have an ACCT team?: No Does Justin have Intensive In-House Services?  : No Does Justin have [facility] services? : No Does Justin have P4CC services?: No  Past Medical History:  Past Medical History:  Diagnosis Date  . Anxiety   . Bipolar 1 disorder ([place])   . CAD S/P percutaneous coronary angioplasty    a. Inf STEMI s/p BMS to RCA 01/2007. b. NSTEMI 07/2013 s/p PTCA to RCA for ISR; c. Transient inferior ST elevation (peak Ti 0.25) 01/2014 s/p PTCA/DES to pRCA, PTCA/DES to dRCA, EF 50%; d. 12/2014 Inf STEMI: RCA patent prox stent, 167md (3.0x32 Synergy DES), EF 35-45; e. 05/2015 Inf STEMI/Cath: LM nl, LAD 10ost/m, D1 75, LCX nl, OM1 25, OM2  30, RCA patent stents, RPDA 30ost.  . Hx of medication noncompliance   . Hyperlipidemia   . Hypertensive heart disease   . Ischemic cardiomyopathy    a. EF 40% in 2011, 60% in  2012. b. EF 55% by cath 07/2013. c. EF 50% by cath 01/2014; d. 12/2014 EF 35-45% by LV gram; e. 05/2015 Echo: EF 50-55% inflat/inf AK, mild AI; f. 06/2015 cMRI: EF 49%, basal & mid inf full thickness scar, subendocardial scar in antsept and antlat wall, correlating w/ D1 dzs.  Marland Kitchen NSVT (nonsustained ventricular tachycardia) ([place])    a. Very brief run during 07/2013 admit for NSTEMI felt due to MI.  . Polysubstance abuse    a. h/o tobacco, marijuana and crack cocaine use. b. 07/2013: +UDS THC, neg for cocaine.  . Tobacco abuse     Past Surgical History:  Procedure Laterality Date  . CARDIAC CATHETERIZATION  01/16/2009   normal left main, Cfx with 2-OMs both w/minor irregularities, LAd with 20-30% mid region irregularities, ramus intermediate/optional diagonal with 60% osital narrowing, RCA with stent in distal portion w/20% prox in-stent stenosis (Dr. Loni Muse. Little)  . CARDIAC CATHETERIZATION  07/23/2013   two vessel obstructive CAD, occluded first diagonal, focal in-stent restenosis in distal RCA (Dr. Peter Martinique)  . CARDIAC CATHETERIZATION N/A 01/10/2015   Procedure: Left Heart Cath and Coronary Angiography;  Surgeon: Leonie Man, MD;  Location: [facility];  Service: Cardiovascular;  Laterality: N/A;  . CARDIAC CATHETERIZATION N/A 01/10/2015   Procedure: Coronary Stent Intervention;  Surgeon: Leonie Man, MD;  Location: [place] [facility];  Service: Cardiovascular;  Laterality: N/A;  . CARDIAC CATHETERIZATION N/A 06/10/2015   Procedure: Left Heart Cath and Coronary Angiography;  Surgeon: Peter M Martinique, MD;  Location: [place];  Service: Cardiovascular;  Laterality: N/A;  . CARDIAC CATHETERIZATION N/A 09/27/2015   Procedure: Left Heart Cath and Coronary Angiography;  Surgeon: Jettie Booze, MD;  Location: [place] [facility];  Service: Cardiovascular;  Laterality: N/A;  . CARDIAC CATHETERIZATION N/A 09/27/2015   Procedure: Coronary Balloon Angioplasty;  Surgeon: Jettie Booze, MD;  Location: [place];  Service: Cardiovascular;  Laterality: N/A;  . CORONARY ANGIOPLASTY WITH STENT PLACEMENT  02/05/2007   3.5x53m Quantum non-DES to RCA (Dr. TShelva Majestic  . FEMORAL ARTERY STENT    . LEFT AND RIGHT HEART CATHETERIZATION WITH CORONARY ANGIOGRAM N/A 01/14/2014   Procedure: LEFT AND RIGHT HEART CATHETERIZATION WITH CORONARY ANGIOGRAM;  Surgeon: CBurnell Blanks MD;  [facility];  Service: Cardiovascular;  Laterality: N/A;  . LEFT HEART CATHETERIZATION WITH CORONARY ANGIOGRAM N/A 07/23/2013   Procedure: LEFT HEART CATHETERIZATION WITH CORONARY ANGIOGRAM;  Surgeon: Peter M JMartinique MD;  [facility];  Service: Cardiovascular;  Laterality: N/A;   Family History:  Family History  Problem Relation Age of Onset  . CAD Mother   . Heart disease Mother   . Heart attack Mother   . Schizophrenia Mother   . CAD Sister    Family Psychiatric  History: none Social History:  History  Alcohol Use  . 0.0 oz/week    Comment: 80 oz of beer weekly     History  Drug Use No    Comment: Marijuana (last used on 01/07/2015)    Social History   Social History  . Marital status: Single    Spouse name: N/A  . Number of children: N/A  . Years of education: N/A   Social History  Main Topics  . Smoking status: Current Every Day Smoker    Packs/day: 0.25    Years: 30.00    Types: Cigarettes  . Smokeless tobacco: Never Used  . Alcohol use 0.0 oz/week     Comment: 80 oz of beer weekly  . Drug use: No     Comment: Marijuana (last used on 01/07/2015)  . Sexual activity: Not Currently   Other Topics Concern  . None   Social History Narrative  . None   Additional Social History:    Allergies:   Allergies  Allergen Reactions  . Iodine Anaphylaxis and Swelling  . Shellfish Allergy Anaphylaxis    All shellfish  . Contrast Media [Iodinated Diagnostic Agents] Nausea And Vomiting    Labs:  Results for orders placed or performed  during the hospital encounter of 03/13/16 (from the past 48 hour(s))  Comprehensive metabolic panel     Status: Abnormal   Collection Time: 03/13/16  1:30 PM  Result Value Ref Range   Sodium 134 (L) 135 - 145 mmol/L   Potassium 3.5 3.5 - 5.1 mmol/L   Chloride 99 (L) 101 - 111 mmol/L   CO2 24 22 - 32 mmol/L   Glucose, Bld 104 (H) 65 - 99 mg/dL   BUN <5 (L) 6 - 20 mg/dL   Creatinine, Ser 1.16 0.61 - 1.24 mg/dL   Calcium 9.4 8.9 - 10.3 mg/dL   Total Protein 7.9 6.5 - 8.1 g/dL   Albumin 4.3 3.5 - 5.0 g/dL   AST 22 15 - 41 U/L   ALT 20 17 - 63 U/L   Alkaline Phosphatase 81 38 - 126 U/L   Total Bilirubin 0.9 0.3 - 1.2 mg/dL   GFR calc non Af Amer >60 >60 mL/min   GFR calc Af Amer >60 >60 mL/min    Comment: (NOTE) The eGFR has been calculated using the CKD EPI equation. This calculation has not been validated in all clinical situations. eGFR's persistently <60 mL/min signify possible Chronic Kidney Disease.    Anion gap 11 5 - 15  Ethanol     Status: None   Collection Time: 03/13/16  1:30 PM  Result Value Ref Range   Alcohol, Ethyl (B) <5 <5 mg/dL    Comment:        LOWEST DETECTABLE LIMIT FOR SERUM ALCOHOL IS 5 mg/dL FOR MEDICAL PURPOSES ONLY   Salicylate level     Status: None   Collection Time: 03/13/16  1:30 PM  Result Value Ref Range   Salicylate Lvl <1.9 2.8 - 30.0 mg/dL  Acetaminophen level     Status: Abnormal   Collection Time: 03/13/16  1:30 PM  Result Value Ref Range   Acetaminophen (Tylenol), Serum <10 (L) 10 - 30 ug/mL    Comment:        THERAPEUTIC CONCENTRATIONS VARY SIGNIFICANTLY. A RANGE OF 10-30 ug/mL MAY BE AN EFFECTIVE CONCENTRATION FOR MANY PATIENTS. HOWEVER, SOME ARE BEST TREATED AT CONCENTRATIONS OUTSIDE THIS RANGE. ACETAMINOPHEN CONCENTRATIONS >150 ug/mL AT 4 HOURS AFTER INGESTION AND >50 ug/mL AT 12 HOURS AFTER INGESTION ARE OFTEN ASSOCIATED WITH TOXIC REACTIONS.   cbc     Status: Abnormal   Collection Time: 03/13/16  1:30 PM  Result  Value Ref Range   WBC 5.0 4.0 - 10.5 K/uL   RBC 4.84 4.22 - 5.81 MIL/uL   Hemoglobin 14.9 13.0 - 17.0 g/dL   HCT 42.8 Justin.0 - 52.0 %   MCV 88.4 78.0 - 100.0 fL   MCH 30.8  26.0 - 34.0 pg   MCHC 34.8 30.0 - 36.0 g/dL   RDW 16.7 (H) 11.5 - 15.5 %   Platelets 230 150 - 400 K/uL  Rapid urine drug screen (hospital performed)     Status: Abnormal   Collection Time: 03/13/16  3:08 PM  Result Value Ref Range   Opiates NONE DETECTED NONE DETECTED   Cocaine NONE DETECTED NONE DETECTED   Benzodiazepines NONE DETECTED NONE DETECTED   Amphetamines NONE DETECTED NONE DETECTED   Tetrahydrocannabinol POSITIVE (A) NONE DETECTED   Barbiturates NONE DETECTED NONE DETECTED    Comment:        DRUG SCREEN FOR MEDICAL PURPOSES ONLY.  IF CONFIRMATION IS NEEDED FOR ANY PURPOSE, NOTIFY LAB WITHIN 5 DAYS.        LOWEST DETECTABLE LIMITS FOR URINE DRUG SCREEN Drug Class       Cutoff (ng/mL) Amphetamine      1000 Barbiturate      200 Benzodiazepine   250 Tricyclics       539 Opiates          300 Cocaine          300 THC              50     Current Facility-Administered Medications  Medication Dose Route Frequency Provider Last Rate Last Dose  . ARIPiprazole (ABILIFY) tablet 5 mg  5 mg Oral BID Norman Clay, MD   5 mg at 03/14/16 2108  . aspirin EC tablet 81 mg  81 mg Oral Daily [employer], PA-C   81 mg at 03/14/16 0919  . atorvastatin (LIPITOR) tablet 80 mg  80 mg Oral q1800 Okey Regal, PA-C   80 mg at 03/14/16 1825  . diphenhydrAMINE (BENADRYL) capsule 50 mg  50 mg Oral Q6H Jeffrey Hedges, PA-C   50 mg at 03/15/16 0816  . famotidine (PEPCID) tablet 20 mg  20 mg Oral BID Okey Regal, PA-C   20 mg at 03/14/16 2109  . lisinopril (PRINIVIL,ZESTRIL) tablet 5 mg  5 mg Oral Daily Okey Regal, PA-C   5 mg at 03/14/16 0919  . loperamide (IMODIUM) capsule 2 mg  2 mg Oral QID PRN Okey Regal, PA-C      . metoprolol succinate (TOPROL-XL) 24 hr tablet 25 mg  25 mg Oral Daily [employer], PA-C    25 mg at 03/14/16 0919  . nicotine (NICODERM CQ - dosed in mg/24 hours) patch 21 mg  21 mg Transdermal Daily Shuvon B Rankin, NP   21 mg at 03/14/16 0920  . nitroGLYCERIN (NITROSTAT) SL tablet 0.4 mg  0.4 mg Sublingual Q5 Min x 3 PRN Okey Regal, PA-C      . promethazine (PHENERGAN) tablet 25 mg  25 mg Oral Q6H PRN Okey Regal, PA-C      . ticagrelor (BRILINTA) tablet 90 mg  90 mg Oral BID Okey Regal, PA-C   90 mg at 03/14/16 2108  . traZODone (DESYREL) tablet 100 mg  100 mg Oral QHS PRN Okey Regal, PA-C   100 mg at 03/14/16 2108   Current Outpatient Prescriptions  Medication Sig Dispense Refill  . aspirin EC 81 MG EC tablet Take 1 tablet (81 mg total) by mouth daily. 30 tablet 0  . atorvastatin (LIPITOR) 80 MG tablet Take 1 tablet (80 mg total) by mouth daily at 6 PM. (Justin not taking: Reported on 03/13/2016) 30 tablet 6  . diphenhydrAMINE (BENADRYL) 50 MG capsule Take 1 capsule (50 mg total) by mouth  every 6 (six) hours. (Justin not taking: Reported on 03/13/2016) 30 capsule 0  . famotidine (PEPCID) 20 MG tablet Take 1 tablet (20 mg total) by mouth 2 (two) times daily. (Justin not taking: Reported on 03/13/2016) 30 tablet 0  . lisinopril (PRINIVIL,ZESTRIL) 5 MG tablet Take 1 tablet (5 mg total) by mouth daily. (Justin not taking: Reported on 03/13/2016) 90 tablet 1  . loperamide (IMODIUM) 2 MG capsule Take 1 capsule (2 mg total) by mouth 4 (four) times daily as needed for diarrhea or loose stools. (Justin not taking: Reported on 03/13/2016) 12 capsule 0  . metoprolol succinate (TOPROL-XL) 25 MG 24 hr tablet Take 1 tablet (25 mg total) by mouth daily. (Justin not taking: Reported on 03/13/2016) 90 tablet 1  . Multiple Vitamin (MULTIVITAMIN WITH MINERALS) TABS tablet Take 1 tablet by mouth daily. (Justin not taking: Reported on 03/13/2016)    . nitroGLYCERIN (NITROSTAT) 0.4 MG SL tablet Place 1 tablet (0.4 mg total) under the tongue every 5 (five) minutes x 3 doses as needed for chest  pain. (Justin not taking: Reported on 03/13/2016) 25 tablet 3  . promethazine (PHENERGAN) 25 MG tablet Take 1 tablet (25 mg total) by mouth every 6 (six) hours as needed for nausea or vomiting. (Justin not taking: Reported on 03/13/2016) 12 tablet 0  . ticagrelor (BRILINTA) 90 MG TABS tablet Take 1 tablet (90 mg total) by mouth 2 (two) times daily. (Justin not taking: Reported on 03/13/2016) 60 tablet 11  . traZODone (DESYREL) 100 MG tablet Take 1 tablet (100 mg total) by mouth at bedtime as needed for sleep. (Justin not taking: Reported on 03/13/2016) 30 tablet 0    Musculoskeletal: Strength & Muscle Tone: within normal limits Gait & Station: normal Justin leans: N/A  Psychiatric Specialty Exam: Physical Exam  Constitutional: He is oriented to person, place, and time. He appears well-developed and well-nourished.  HENT:  Head: Normocephalic.  Neck: Normal range of motion.  Respiratory: Effort normal.  Musculoskeletal: Normal range of motion.  Neurological: He is alert and oriented to person, place, and time.  Psychiatric: He has a normal mood and affect. His speech is normal and behavior is normal. Judgment and thought content normal. Cognition and memory are normal.    Review of Systems  All other systems reviewed and are negative.   Blood pressure 94/73, pulse 61, temperature 98 F (36.7 C), temperature source Oral, resp. rate 18, SpO2 98 %.There is no height or weight on file to calculate BMI.  General Appearance: Casual  Eye Contact:  Good  Speech:  Normal Rate  Volume:  Normal  Mood:  Euthymic  Affect:  Congruent  Thought Process:  Coherent and Descriptions of Associations: Intact  Orientation:  Full (Time, Place, and Person)  Thought Content:  WDL and Logical  Suicidal Thoughts:  No  Homicidal Thoughts:  No  Memory:  Immediate;   Good Recent;   Good Remote;   Good  Judgement:  Fair  Insight:  Fair  Psychomotor Activity:  Normal  Concentration:  Concentration: Good and  Attention Span: Good  Recall:  Good  Fund of Knowledge:  Fair  Language:  Good  Akathisia:  No  Handed:  Right  AIMS (if indicated):     Assets:  Housing Leisure Time Physical Health Resilience Social Support  ADL's:  Intact  Cognition:  WNL  Sleep:        Treatment Plan Summary: Daily contact with Justin to assess and evaluate symptoms and progress in treatment,  Medication management and Plan schizoaffective disorder, bipolar type:  -Crisis stabilization -Medication management:  Abilify 5 mg BID for mood stabilization and Trazodone 100 mg at bedtime for sleep along with his medical medications -Individual counseling  Disposition: No evidence of imminent risk to self or others at present.    Waylan Boga, NP 03/15/2016 9:38 AM  Justin seen face-to-face for psychiatric evaluation, chart reviewed and case discussed with the physician extender and developed treatment plan. Reviewed the information documented and agree with the treatment plan. Corena Pilgrim, MD

## 2016-03-15 NOTE — Discharge Instructions (Signed)
For your ongoing behavioral health needs, you are advised to follow up with one of the following providers.  Contact them at your earliest opportunity to ask about scheduling an intake appointment: ° °     [facility] °     [address] °     [place], [place] [address] °     [phone] °     New patients are seen at their walk-in clinic.  Walk-in hours are Monday - Friday from 8:00 am - 12:00 pm, and from 1:00 pm - 3:00 pm.  Walk-in patients are seen on a first come, first served basis, so try to arrive as early as possible for the best chance of being seen the same day.  There is an initial fee of $22.50. ° °     [facility] °     [address] °     [place], [place] [address] °     [phone] °

## 2016-03-15 NOTE — ED Notes (Signed)
Pt discharged home. Discharged instructions read to pt who verbalized understanding. All belongings returned to pt who signed for same. Denies SI/HI, is not delusional and not responding to internal stimuli. Escorted pt to the ED exit.    

## 2016-03-15 NOTE — [facility] Suicide Risk Assessment (Signed)
Suicide Risk Assessment  Dis[facility]   [facility]   Principal Problem: Schizoaffective disorder, bipolar type [facility](HCC) Discharge Diagnoses:  Patient Active Problem List   Diagnosis Date Noted  . Schizoaffective disorder, bipolar type (HCC) [F25.0] 03/06/2015    Priority: High  . Tetrahydrocannabinol (THC) use disorder, severe, dependence (HCC) [F12.20] 03/14/2016  . Malnutrition of moderate degree [E44.0] 09/28/2015  . Hyperlipidemia [E78.5]   . Nausea with vomiting [R11.2] 03/06/2015  . Hypertensive heart disease [I11.9] 01/11/2015  . ST elevation myocardial infarction (STEMI) of inferior wall (HCC) [I21.19] 01/10/2015  . ST elevation myocardial infarction (STEMI) of inferior wall, initial episode of care (HCC) [I21.19] 01/10/2015  . Medical non-compliance [Z91.19]   . ST elevation myocardial infarction involving right coronary artery (HCC) [I21.11] 01/15/2014  . Hx of medication noncompliance [Z91.14]   . NSVT (nonsustained ventricular tachycardia) (HCC) [I47.2]   . Ischemic cardiomyopathy [I25.5]   . Tobacco abuse [Z72.0]   . Anxiety [F41.9]   . 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 DES PCI to RCA x 2 (3.0 mm x 22 mm Resolute - distal & proximal RCA) [Z95.5] 01/14/2014    Class: Present on Admission  . Non-STEMI (non-ST elevated myocardial infarction) (HCC) [I21.4] 07/22/2013  . Atypical chest pain [R07.89] 07/21/2013  . Bipolar 1 disorder, depressed (HCC) [F31.9] 07/21/2013  . Chest pain [R07.9] 04/16/2013  . HLD (hyperlipidemia) [E78.5] 04/16/2013  . Hyperlipidemia with target LDL less than 70 [E78.5] 01/27/2009  . SUBSTANCE ABUSE [F19.10] 01/27/2009  . DEPRESSION [F32.9] 01/27/2009  . Essential hypertension [I10] 01/27/2009  . CAD S/P BMS PCI to RCA with PTCA for ISR --> followed by stent thrombosis - DES PCI [I25.10, Z98.61] 01/27/2009  . GASTRITIS [K29.70, K29.90] 01/27/2009    Total Time spent with patient: 45 minutes    Musculoskeletal: Strength & Muscle Tone: within normal limits Gait & Station: normal Patient leans: N/A  Psychiatric Specialty Exam: Physical Exam  Constitutional: He is oriented to person, place, and time. He appears well-developed and well-nourished.  HENT:  Head: Normocephalic.  Neck: Normal range of motion.  Respiratory: Effort normal.  Musculoskeletal: Normal range of motion.  Neurological: He is alert and oriented to person, place, and time.  Psychiatric: He has a normal mood and affect. His speech is normal and behavior is normal. Judgment and thought content normal. Cognition and memory are normal.    Review of Systems  All other systems reviewed and are negative.   Blood pressure 94/73, pulse 61, temperature 98 F (36.7 C), temperature source Oral, resp. rate 18, SpO2 98 %.There is no height or weight on file to calculate BMI.  General Appearance: Casual  Eye Contact:  Good  Speech:  Normal Rate  Volume:  Normal  Mood:  Euthymic  Affect:  Congruent  Thought Process:  Coherent and Descriptions of Associations: Intact  Orientation:  Full (Time, Place, and Person)  Thought Content:  WDL and Logical  Suicidal Thoughts:  No  Homicidal Thoughts:  No  Memory:  Immediate;   Good Recent;   Good Remote;   Good  Judgement:  Fair  Insight:  Fair  Psychomotor Activity:  Normal  Concentration:  Concentration: Good and Attention Span: Good  Recall:  Good  Fund of Knowledge:  Fair  Language:  Good  Akathisia:  No  Handed:  Right  AIMS (if indicated):     Assets:  Housing Leisure Time Physical Health Resilience Social Support  ADL's:  Intact  Cognition:  WNL  Sleep:       Mental Status Per Nursing Assessment::   On Admission:   hallucinations with suicidal ideations  Demographic Factors:  Male  Loss Factors: NA  Historical Factors: NA  Risk Reduction Factors:   Sense of responsibility to family, Living with another person, especially a relative and  Positive social support  Continued Clinical Symptoms:  None  Cognitive Features That Contribute To Risk:  None    Suicide Risk:  Minimal: No identifiable suicidal ideation.  Patients presenting with no risk factors but with morbid ruminations; may be classified as minimal risk based on the severity of the depressive symptoms    Plan Of Care/Follow-up recommendations:  Activity:  as tolerated Diet:  heart healthy diet  Maalik Pinn, NP 03/15/2016, 12:28 PM

## 2016-03-15 NOTE — BH Assessment (Signed)
[facility] Assessment Progress Note  Per Thedore MinsMojeed Akintayo, MD, this pt does not require psychiatric hospitalization at this time.  [facility] or [facility].  This has been included in pt's discharge instructions.  Pt's nurse, Diane, has been notified.  Doylene Canninghomas Sophronia Varney, MA Triage Specialist [phone]

## 2016-03-23 ENCOUNTER — Emergency Department (HOSPITAL_COMMUNITY): Payer: Medicaid Other

## 2016-03-23 ENCOUNTER — Emergency Department (HOSPITAL_COMMUNITY)
Admission: EM | Admit: 2016-03-23 | Discharge: 2016-03-23 | Disposition: A | Discharge status: Home / Self Care | Payer: Medicaid Other | Attending: Emergency Medicine | Admitting: Emergency Medicine

## 2016-03-23 ENCOUNTER — Encounter (HOSPITAL_COMMUNITY): Payer: Self-pay | Admitting: Emergency Medicine

## 2016-03-23 DIAGNOSIS — Y999 Unspecified external cause status: Secondary | ICD-10-CM | POA: Diagnosis not present

## 2016-03-23 DIAGNOSIS — Y929 Unspecified place or not applicable: Secondary | ICD-10-CM | POA: Diagnosis not present

## 2016-03-23 DIAGNOSIS — I2510 Atherosclerotic heart disease of native coronary artery without angina pectoris: Secondary | ICD-10-CM | POA: Diagnosis not present

## 2016-03-23 DIAGNOSIS — F17210 Nicotine dependence, cigarettes, uncomplicated: Secondary | ICD-10-CM | POA: Insufficient documentation

## 2016-03-23 DIAGNOSIS — I252 Old myocardial infarction: Secondary | ICD-10-CM | POA: Insufficient documentation

## 2016-03-23 DIAGNOSIS — Z7982 Long term (current) use of aspirin: Secondary | ICD-10-CM | POA: Insufficient documentation

## 2016-03-23 DIAGNOSIS — Z79899 Other long term (current) drug therapy: Secondary | ICD-10-CM | POA: Diagnosis not present

## 2016-03-23 DIAGNOSIS — M25531 Pain in right wrist: Secondary | ICD-10-CM | POA: Insufficient documentation

## 2016-03-23 DIAGNOSIS — Y939 Activity, unspecified: Secondary | ICD-10-CM | POA: Diagnosis not present

## 2016-03-23 MED ORDER — HYDROCODONE-ACETAMINOPHEN 5-325 MG PO TABS
1.0000 | ORAL_TABLET | Freq: Once | ORAL | Status: AC
  Administered 2016-03-23: 1 via ORAL
  Filled 2016-03-23: qty 1

## 2016-03-23 MED ORDER — NAPROXEN 500 MG PO TABS: 500.0000 mg | ORAL_TABLET | Freq: Two times a day (BID) | ORAL | 0 refills | Status: DC
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
## 2016-03-23 NOTE — Discharge Instructions (Signed)
You were seen today for wrist pain. There is no evidence of fracture on your x-ray however, given your location of pain, I suspect he may have a fracture that we cannot see. He will be placed in a splint. Maintain the splint at all times until follow-up. You will need repeat x-ray and evaluation. If you have any new or worsening symptoms you need to be reevaluated.

## 2016-03-23 NOTE — ED Notes (Signed)
Pt reports understanding of discharge information. No questions at time of discharge 
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
## 2016-03-23 NOTE — ED Notes (Signed)
Pt. Doesn't want an icepack, "it makes him cold." RN,Lindsay made aware.

## 2016-06-09 ENCOUNTER — Emergency Department (HOSPITAL_COMMUNITY): Payer: Medicaid Other

## 2016-06-09 ENCOUNTER — Emergency Department (HOSPITAL_COMMUNITY)
Admission: EM | Admit: 2016-06-09 | Discharge: 2016-06-10 | Disposition: A | Discharge status: Home / Self Care | Payer: Medicaid Other | Attending: Emergency Medicine | Admitting: Emergency Medicine

## 2016-06-09 DIAGNOSIS — R112 Nausea with vomiting, unspecified: Secondary | ICD-10-CM | POA: Insufficient documentation

## 2016-06-09 DIAGNOSIS — R197 Diarrhea, unspecified: Secondary | ICD-10-CM

## 2016-06-09 DIAGNOSIS — I119 Hypertensive heart disease without heart failure: Secondary | ICD-10-CM | POA: Insufficient documentation

## 2016-06-09 DIAGNOSIS — R110 Nausea: Secondary | ICD-10-CM | POA: Diagnosis present

## 2016-06-09 DIAGNOSIS — R1084 Generalized abdominal pain: Secondary | ICD-10-CM | POA: Diagnosis not present

## 2016-06-09 DIAGNOSIS — I10 Essential (primary) hypertension: Secondary | ICD-10-CM

## 2016-06-09 DIAGNOSIS — I2510 Atherosclerotic heart disease of native coronary artery without angina pectoris: Secondary | ICD-10-CM | POA: Insufficient documentation

## 2016-06-09 DIAGNOSIS — F17210 Nicotine dependence, cigarettes, uncomplicated: Secondary | ICD-10-CM | POA: Diagnosis not present

## 2016-06-09 LAB — CBC
HEMATOCRIT: 42.2 % (ref 39.0–52.0)
HEMOGLOBIN: 15.1 g/dL (ref 13.0–17.0)
MCH: 32.1 pg (ref 26.0–34.0)
MCHC: 35.8 g/dL (ref 30.0–36.0)
MCV: 89.8 fL (ref 78.0–100.0)
Platelets: 217 10*3/uL (ref 150–400)
RBC: 4.7 MIL/uL (ref 4.22–5.81)
RDW: 16.2 % — ABNORMAL HIGH (ref 11.5–15.5)
WBC: 4.4 10*3/uL (ref 4.0–10.5)

## 2016-06-09 LAB — COMPREHENSIVE METABOLIC PANEL
ALBUMIN: 4.4 g/dL (ref 3.5–5.0)
ALT: 29 U/L (ref 17–63)
ANION GAP: 13 (ref 5–15)
AST: 22 U/L (ref 15–41)
Alkaline Phosphatase: 76 U/L (ref 38–126)
BUN: 9 mg/dL (ref 6–20)
CHLORIDE: 100 mmol/L — AB (ref 101–111)
CO2: 27 mmol/L (ref 22–32)
Calcium: 9.8 mg/dL (ref 8.9–10.3)
Creatinine, Ser: 1.16 mg/dL (ref 0.61–1.24)
GFR calc non Af Amer: >60 mL/min (ref >60)
GLUCOSE: 104 mg/dL — AB (ref 65–99)
Potassium: 3.4 mmol/L — ABNORMAL LOW (ref 3.5–5.1)
SODIUM: 140 mmol/L (ref 135–145)
Total Bilirubin: 1 mg/dL (ref 0.3–1.2)
Total Protein: 8.1 g/dL (ref 6.5–8.1)

## 2016-06-09 LAB — LIPASE, BLOOD: Lipase: 21 U/L (ref 11–51)

## 2016-06-09 MED ORDER — ARIPIPRAZOLE 5 MG PO TABS: 5.0000 mg | ORAL_TABLET | Freq: Two times a day (BID) | ORAL | 0 refills | Status: DC

## 2016-06-09 MED ORDER — METOCLOPRAMIDE HCL 5 MG/ML IJ SOLN
10.0000 mg | Freq: Once | INTRAMUSCULAR | Status: AC
  Administered 2016-06-09: 10 mg via INTRAVENOUS
  Filled 2016-06-09: qty 2

## 2016-06-09 MED ORDER — CAPSAICIN 0.025 % EX CREA
TOPICAL_CREAM | Freq: Once | CUTANEOUS | Status: AC
  Administered 2016-06-09: 21:00:00 via TOPICAL
  Filled 2016-06-09: qty 60

## 2016-06-09 MED ORDER — DICYCLOMINE HCL 20 MG PO TABS: 20.0000 mg | ORAL_TABLET | Freq: Two times a day (BID) | ORAL | 0 refills | Status: DC

## 2016-06-09 MED ORDER — PROMETHAZINE HCL 25 MG PO TABS: 25.0000 mg | ORAL_TABLET | Freq: Four times a day (QID) | ORAL | 0 refills | Status: DC | PRN

## 2016-06-09 MED ORDER — SODIUM CHLORIDE 0.9 % IV BOLUS (SEPSIS)
1000.0000 mL | Freq: Once | INTRAVENOUS | Status: AC
  Administered 2016-06-09: 1000 mL via INTRAVENOUS

## 2016-06-09 MED ORDER — DICYCLOMINE HCL 10 MG/ML IM SOLN
20.0000 mg | Freq: Once | INTRAMUSCULAR | Status: AC
  Administered 2016-06-09: 20 mg via INTRAMUSCULAR
  Filled 2016-06-09: qty 2

## 2016-06-09 NOTE — ED Notes (Signed)
Pt is unable to urinate at this time. 

## 2016-06-09 NOTE — ED Provider Notes (Signed)
[facility]-EMERGENCY DEPT Provider Note   CSN: [id number] Arrival date & time: 06/09/16  1518     History   Chief Complaint Chief Complaint  Patient presents with  . Nausea    HPI [name] is a 41 y.o. male.  HPI 41 year old past medical history significant for anxiety, bipolar disorder, schizophrenia, ischemic cardiomyopathy last EF of 50-55% in 2017, polysubstance abuse, CAD status post PCI presents to the emergency Department today with complaints of nausea, and abdominal cramping and diarrhea. Patient states that he is no nausea and vomiting for the past 5 days along with diarrhea that is nonbloody. Denies any hematocrit emesis. Reports daily marijuana use. States he is out of his Abilify for the past 3 weeks has had poor food intake for the past 3 weeks due to being off his medications per patient. Denies any sick contacts, recent hospitalizations, recent travel, new foods. Denies any recent antibiotic use. The patient has not tried nothing for his symptoms at home prior to arrival. Denies any fever, chills, headache, vision changes, lightheadedness, urinary symptoms, melena, hematochezia, paresthesias. Past Medical History:  Diagnosis Date  . Anxiety   . Bipolar 1 disorder (HCC)   . CAD S/P percutaneous coronary angioplasty    a. Inf STEMI s/p BMS to RCA 01/2007. b. NSTEMI 07/2013 s/p PTCA to RCA for ISR; c. Transient inferior ST elevation (peak Ti 0.25) 01/2014 s/p PTCA/DES to pRCA, PTCA/DES to dRCA, EF 50%; d. 12/2014 Inf STEMI: RCA patent prox stent, 124m/d (3.0x32 Synergy DES), EF 35-45; e. 05/2015 Inf STEMI/Cath: LM nl, LAD 10ost/m, D1 75, LCX nl, OM1 25, OM2 30, RCA patent stents, RPDA 30ost.  . Hx of medication noncompliance   . Hyperlipidemia   . Hypertensive heart disease   . Ischemic cardiomyopathy    a. EF 40% in 2011, 60% in 2012. b. EF 55% by cath 07/2013. c. EF 50% by cath 01/2014; d. 12/2014 EF 35-45% by LV gram; e. 05/2015 Echo: EF 50-55% inflat/inf AK, mild AI; f.  06/2015 cMRI: EF 49%, basal & mid inf full thickness scar, subendocardial scar in antsept and antlat wall, correlating w/ D1 dzs.  Marland Kitchen NSVT (nonsustained ventricular tachycardia) (HCC)    a. Very brief run during 07/2013 admit for NSTEMI felt due to MI.  . Polysubstance abuse    a. h/o tobacco, marijuana and crack cocaine use. b. 07/2013: +UDS THC, neg for cocaine.  . Tobacco abuse     Patient Active Problem List   Diagnosis Date Noted  . Tetrahydrocannabinol (THC) use disorder, severe, dependence (HCC) 03/14/2016  . Malnutrition of moderate degree 09/28/2015  . Hyperlipidemia   . Schizoaffective disorder, bipolar type (HCC) 03/06/2015  . Nausea with vomiting 03/06/2015  . Hypertensive heart disease 01/11/2015  . ST elevation myocardial infarction (STEMI) of inferior wall (HCC) 01/10/2015  . ST elevation myocardial infarction (STEMI) of inferior wall, initial episode of care (HCC) 01/10/2015  . Medical non-compliance   . ST elevation myocardial infarction involving right coronary artery (HCC) 01/15/2014  . Hx of medication noncompliance   . NSVT (nonsustained ventricular tachycardia) (HCC)   . Ischemic cardiomyopathy   . Tobacco abuse   . Anxiety   . 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 DES PCI to RCA x 2 (3.0 mm x 22 mm Resolute - distal & proximal RCA) 01/14/2014    Class: Present on Admission  . Non-STEMI (non-ST elevated myocardial infarction) (HCC) 07/22/2013  . Atypical chest pain 07/21/2013  . Bipolar 1 disorder, depressed (HCC) 07/21/2013  .  Chest pain 04/16/2013  . HLD (hyperlipidemia) 04/16/2013  . Hyperlipidemia with target LDL less than 70 01/27/2009  . SUBSTANCE ABUSE 01/27/2009  . DEPRESSION 01/27/2009  . Essential hypertension 01/27/2009  . CAD S/P BMS PCI to RCA with PTCA for ISR --> followed by stent thrombosis - DES PCI 01/27/2009  . GASTRITIS 01/27/2009    Past Surgical History:  Procedure Laterality Date  . CARDIAC CATHETERIZATION  01/16/2009   normal left  main, Cfx with 2-OMs both w/minor irregularities, LAd with 20-30% mid region irregularities, ramus intermediate/optional diagonal with 60% osital narrowing, RCA with stent in distal portion w/20% prox in-stent stenosis (Dr. Mervyn Skeeters. [place])  . CARDIAC CATHETERIZATION  07/23/2013   two vessel obstructive CAD, occluded first diagonal, focal in-stent restenosis in distal RCA (Dr. Peter Swaziland)  . CARDIAC CATHETERIZATION N/A 01/10/2015   Procedure: Left Heart Cath and Coronary Angiography;  Surgeon: Marykay Lex, MD;  Location: [facility] INVASIVE CV LAB;  Service: Cardiovascular;  Laterality: N/A;  . CARDIAC CATHETERIZATION N/A 01/10/2015   Procedure: Coronary Stent Intervention;  Surgeon: Marykay Lex, MD;  Location: [facility] INVASIVE CV LAB;  Service: Cardiovascular;  Laterality: N/A;  . CARDIAC CATHETERIZATION N/A 06/10/2015   Procedure: Left Heart Cath and Coronary Angiography;  Surgeon: Peter M Swaziland, MD;  Location: [facility] INVASIVE CV LAB;  Service: Cardiovascular;  Laterality: N/A;  . CARDIAC CATHETERIZATION N/A 09/27/2015   Procedure: Left Heart Cath and Coronary Angiography;  Surgeon: Corky Crafts, MD;  Location: [facility] INVASIVE CV LAB;  Service: Cardiovascular;  Laterality: N/A;  . CARDIAC CATHETERIZATION N/A 09/27/2015   Procedure: Coronary Balloon Angioplasty;  Surgeon: Corky Crafts, MD;  Location: [facility] INVASIVE CV LAB;  Service: Cardiovascular;  Laterality: N/A;  . CORONARY ANGIOPLASTY WITH STENT PLACEMENT  02/05/2007   3.5x69mm Quantum non-DES to RCA (Dr. Nicki Guadalajara)  . FEMORAL ARTERY STENT    . LEFT AND RIGHT HEART CATHETERIZATION WITH CORONARY ANGIOGRAM N/A 01/14/2014   Procedure: LEFT AND RIGHT HEART CATHETERIZATION WITH CORONARY ANGIOGRAM;  Surgeon: Kathleene Hazel, MD;  Location: [facility] CATH LAB;  Service: Cardiovascular;  Laterality: N/A;  . LEFT HEART CATHETERIZATION WITH CORONARY ANGIOGRAM N/A 07/23/2013   Procedure: LEFT HEART CATHETERIZATION WITH CORONARY ANGIOGRAM;  Surgeon: Peter M  Swaziland, MD;  Location: [facility] CATH LAB;  Service: Cardiovascular;  Laterality: N/A;       Home Medications    Prior to Admission medications   Medication Sig Start Date End Date Taking? Authorizing Provider  aspirin 81 MG EC tablet Take 1 tablet (81 mg total) by mouth daily. 03/15/16  Yes Charm Rings, NP  atorvastatin (LIPITOR) 80 MG tablet Take 1 tablet (80 mg total) by mouth daily at 6 PM. 03/15/16  Yes Lord, Herminio Heads, NP  famotidine (PEPCID) 20 MG tablet Take 1 tablet (20 mg total) by mouth 2 (two) times daily. 03/15/16  Yes Charm Rings, NP  lisinopril (PRINIVIL,ZESTRIL) 5 MG tablet Take 1 tablet (5 mg total) by mouth daily. 03/15/16  Yes Charm Rings, NP  metoprolol succinate (TOPROL-XL) 25 MG 24 hr tablet Take 1 tablet (25 mg total) by mouth daily. 03/15/16  Yes Charm Rings, NP  ticagrelor (BRILINTA) 90 MG TABS tablet Take 1 tablet (90 mg total) by mouth 2 (two) times daily. 03/15/16  Yes Lord, Herminio Heads, NP  ARIPiprazole (ABILIFY) 5 MG tablet Take 1 tablet (5 mg total) by mouth 2 (two) times daily. 06/09/16   Rise Mu, PA-C  dicyclomine (BENTYL) 20 MG tablet Take  1 tablet (20 mg total) by mouth 2 (two) times daily. 06/09/16   Rise Mu, PA-C  diphenhydrAMINE (BENADRYL) 50 MG capsule Take 1 capsule (50 mg total) by mouth every 6 (six) hours. Patient not taking: Reported on 03/13/2016 09/30/15   Leone Brand, NP  loperamide (IMODIUM) 2 MG capsule Take 1 capsule (2 mg total) by mouth 4 (four) times daily as needed for diarrhea or loose stools. Patient not taking: Reported on 03/13/2016 11/08/15   de Villier, Daryl F II, PA  Multiple Vitamin (MULTIVITAMIN WITH MINERALS) TABS tablet Take 1 tablet by mouth daily. Patient not taking: Reported on 03/13/2016 10/01/15   Leone Brand, NP  naproxen (NAPROSYN) 500 MG tablet Take 1 tablet (500 mg total) by mouth 2 (two) times daily. Patient not taking: Reported on 06/09/2016 03/23/16   Horton, Mayer Masker, MD  nitroGLYCERIN  (NITROSTAT) 0.4 MG SL tablet Place 1 tablet (0.4 mg total) under the tongue every 5 (five) minutes x 3 doses as needed for chest pain. 03/15/16   Charm Rings, NP  promethazine (PHENERGAN) 25 MG tablet Take 1 tablet (25 mg total) by mouth every 6 (six) hours as needed for nausea or vomiting. 06/09/16   Rise Mu, PA-C  traZODone (DESYREL) 100 MG tablet Take 1 tablet (100 mg total) by mouth at bedtime as needed for sleep. 03/15/16   Charm Rings, NP    Family History Family History  Problem Relation Age of Onset  . CAD Mother   . Heart disease Mother   . Heart attack Mother   . Schizophrenia Mother   . CAD Sister     Social History Social History  Substance Use Topics  . Smoking status: Current Every Day Smoker    Packs/day: 0.25    Years: 30.00    Types: Cigarettes  . Smokeless tobacco: Never Used  . Alcohol use 0.0 oz/week     Comment: 80 oz of beer weekly     Allergies   Iodine; Shellfish allergy; and Contrast media [iodinated diagnostic agents]   Review of Systems Review of Systems  Constitutional: Positive for appetite change. Negative for chills and fever.  HENT: Negative for congestion.   Eyes: Negative for visual disturbance.  Respiratory: Negative for cough and shortness of breath.   Cardiovascular: Negative for chest pain, palpitations and leg swelling.  Gastrointestinal: Positive for abdominal pain (generalized), diarrhea, nausea and vomiting. Negative for blood in stool and constipation.  Genitourinary: Negative for dysuria, flank pain, frequency, hematuria and urgency.  Musculoskeletal: Negative.   Skin: Negative.   Neurological: Negative for dizziness, syncope, weakness, light-headedness and headaches.     Physical Exam Updated Vital Signs BP 117/69 (BP Location: Right Arm)   Pulse 80   Temp 98.8 F (37.1 C) (Oral)   Resp 18   SpO2 98%   Physical Exam  Constitutional: He is oriented to person, place, and time. He appears well-developed  and well-nourished. No distress.  Patient is well-appearing and nontoxic.  HENT:  Head: Normocephalic and atraumatic.  Mouth/Throat: Oropharynx is clear and moist.  Mucous membranes are moist.  Eyes: Conjunctivae are normal. Right eye exhibits no discharge. Left eye exhibits no discharge. No scleral icterus.  Neck: Normal range of motion. Neck supple. No thyromegaly present.  Cardiovascular: Normal rate, regular rhythm, normal heart sounds and intact distal pulses.  Exam reveals no gallop and no friction rub.   No murmur heard. Pulmonary/Chest: Effort normal and breath sounds normal. No respiratory distress. He  has no wheezes. He has no rales. He exhibits no tenderness.  Abdominal: Soft. He exhibits no distension. Bowel sounds are increased. There is generalized tenderness. There is no rigidity, no rebound, no guarding, no CVA tenderness, no tenderness at McBurney's point and negative Murphy's sign.  Musculoskeletal: Normal range of motion. He exhibits no edema.  Lymphadenopathy:    He has no cervical adenopathy.  Neurological: He is alert and oriented to person, place, and time.  Skin: Skin is warm and dry. Capillary refill takes less than 2 seconds.  Nursing note and vitals reviewed.    ED Treatments / Results  Labs (all labs ordered are listed, but only abnormal results are displayed) Labs Reviewed  COMPREHENSIVE METABOLIC PANEL - Abnormal; Notable for the following:       Result Value   Potassium 3.4 (*)    Chloride 100 (*)    Glucose, Bld 104 (*)    All other components within normal limits  CBC - Abnormal; Notable for the following:    RDW 16.2 (*)    All other components within normal limits  LIPASE, BLOOD    EKG  EKG Interpretation  Date/Time:  Wednesday Jun 09 2016 15:40:34 EDT Ventricular Rate:  58 PR Interval:    QRS Duration: 117 QT Interval:  445 QTC Calculation: 438 R Axis:   63 Text Interpretation:  Sinus rhythm Incomplete right bundle branch block since  last tracing no significant change Confirmed by Mancel Bale [phone]) on 06/09/2016 8:31:27 PM       Radiology Ct Abdomen Pelvis Wo Contrast  Result Date: 06/09/2016 CLINICAL DATA:  Acute onset of nausea and vomiting. Initial encounter. EXAM: CT ABDOMEN AND PELVIS WITHOUT CONTRAST TECHNIQUE: Multidetector CT imaging of the abdomen and pelvis was performed following the standard protocol without IV contrast. COMPARISON:  CT of the abdomen and pelvis from 11/08/2015 FINDINGS: Lower chest: The visualized lung bases are grossly clear. Scattered coronary artery calcifications are seen. Hepatobiliary: The liver is unremarkable in appearance. The gallbladder is unremarkable in appearance. The common bile duct remains normal in caliber. Pancreas: The pancreas is within normal limits. Spleen: The spleen is unremarkable in appearance. Adrenals/Urinary Tract: The adrenal glands are unremarkable in appearance. The kidneys are within normal limits. There is no evidence of hydronephrosis. No renal or ureteral stones are identified. No perinephric stranding is seen. Stomach/Bowel: The stomach is unremarkable in appearance. The small bowel is within normal limits. The appendix is normal in caliber, without evidence of appendicitis. The colon is unremarkable in appearance. Vascular/Lymphatic: The abdominal aorta is unremarkable in appearance. Mild calcification is seen along the common iliac arteries bilaterally. The inferior vena cava is grossly unremarkable. No retroperitoneal lymphadenopathy is seen. No pelvic sidewall lymphadenopathy is identified. Reproductive: The bladder is mildly distended and grossly unremarkable. The prostate remains normal in size. Other: No additional soft tissue abnormalities are seen. Musculoskeletal: No acute osseous abnormalities are identified. The visualized musculature is unremarkable in appearance. IMPRESSION: 1. No acute abnormality seen within the abdomen or pelvis. 2. Scattered  coronary artery calcifications seen. Electronically Signed   By: Roanna Raider M.D.   On: 06/09/2016 23:04    Procedures Procedures (including critical care time)  Medications Ordered in ED Medications  dicyclomine (BENTYL) injection 20 mg (20 mg Intramuscular Given 06/09/16 2025)  sodium chloride 0.9 % bolus 1,000 mL (0 mLs Intravenous Stopped 06/09/16 2222)  metoCLOPramide (REGLAN) injection 10 mg (10 mg Intravenous Given 06/09/16 2025)  capsaicin (ZOSTRIX) 0.025 % cream ( Topical Given  06/09/16 2101)     Initial Impression / Assessment and Plan / ED Course  I have reviewed the triage vital signs and the nursing notes.  Pertinent labs & imaging results that were available during my care of the patient were reviewed by me and considered in my medical decision making (see chart for details).     Patient was sent to the emergency Department today with complaints of emesis, diarrhea, diffuse generalized abdominal pain. Patient states he's had poor by mouth intake for the past 3 weeks after being off of his Abilify for his schizophrenia and bipolar. Lab work is been reassuring. No leukocytosis is noted. Electrolytes are normal. Creatinine is normal. Patient given Reglan, Bentyl and capsaicin in the ED. I have suspicion of patient's vomiting could be secondary to marijuana use. EKG was obtained that showed no change since prior tracing. Patient denies any chest pain or shortness of breath without ACS. CT scan of abdomen shows no acute abnormalities. Patient able to tolerate by mouth fluids or any difficulties. Vital signs remained stable. Patient is afebrile. Patient was reassessed abdominal exam remains benign without any focal tenderness concerning for peritonitis. Patient denies any urinary symptoms and do not feel that ua is indicated. The patient's symptoms could likely due to gastroenteritis.    Vitals are stable, no fever.  No signs of dehydration, tolerating PO fluids > 6 oz.  Lungs are  clear.  No focal abdominal pain, no concern for appendicitis, cholecystitis, pancreatitis, ruptured viscus, UTI, kidney stone, or any other abdominal etiology.  Supportive therapy indicated with return if symptoms worsen.  Patient counseled.   Final Clinical Impressions(s) / ED Diagnoses   Final diagnoses:  Nausea vomiting and diarrhea  Generalized abdominal pain    New Prescriptions New Prescriptions   DICYCLOMINE (BENTYL) 20 MG TABLET    Take 1 tablet (20 mg total) by mouth 2 (two) times daily.     Rise MuLeaphart, Amparo Donalson T, PA-C 06/10/16 Pernell Dupre0008    Mancel BaleWentz, Elliott, MD 06/11/16 2018

## 2016-06-09 NOTE — ED Notes (Signed)
Asked for urine pt says no and no in and out

## 2016-06-09 NOTE — Discharge Instructions (Signed)
Clear liquid diet for 24 hours and advance diet as tolerated. Lab work is been reassuring. CT scan shows no abnormalities. Symptoms could be likely due to a GI illness. Use the Phenergan as needed for pain and nausea. Bentyl for stomach cramps. Have given you short supply of Abilify. Please follow up with your primary care doctor. Return to the ED if you develop fevers, worsening pain, blood in your stool. Or for any other reason.

## 2016-06-09 NOTE — ED Notes (Signed)
Pt still not able to give urine sample.  

## 2016-06-09 NOTE — ED Triage Notes (Signed)
Per [facility] pt from home , reports nausea and vomiting x 5 days, Hx bipolar and schizophrenia, no meds x 3 weeks per ems per pt .

## 2016-07-08 ENCOUNTER — Inpatient Hospital Stay (HOSPITAL_COMMUNITY)
Admission: EM | Admit: 2016-07-08 | Discharge: 2016-07-12 | DRG: 287 | Disposition: A | Discharge status: Home / Self Care | Payer: Medicaid Other | Attending: Internal Medicine | Admitting: Internal Medicine

## 2016-07-08 ENCOUNTER — Encounter (HOSPITAL_COMMUNITY): Payer: Self-pay | Admitting: Emergency Medicine

## 2016-07-08 ENCOUNTER — Emergency Department (HOSPITAL_COMMUNITY): Payer: Medicaid Other

## 2016-07-08 DIAGNOSIS — F329 Major depressive disorder, single episode, unspecified: Secondary | ICD-10-CM | POA: Diagnosis present

## 2016-07-08 DIAGNOSIS — Z91013 Allergy to seafood: Secondary | ICD-10-CM

## 2016-07-08 DIAGNOSIS — F17210 Nicotine dependence, cigarettes, uncomplicated: Secondary | ICD-10-CM | POA: Diagnosis present

## 2016-07-08 DIAGNOSIS — F1220 Cannabis dependence, uncomplicated: Secondary | ICD-10-CM | POA: Diagnosis not present

## 2016-07-08 DIAGNOSIS — K219 Gastro-esophageal reflux disease without esophagitis: Secondary | ICD-10-CM | POA: Diagnosis present

## 2016-07-08 DIAGNOSIS — I110 Hypertensive heart disease with heart failure: Secondary | ICD-10-CM | POA: Diagnosis present

## 2016-07-08 DIAGNOSIS — I255 Ischemic cardiomyopathy: Secondary | ICD-10-CM | POA: Diagnosis present

## 2016-07-08 DIAGNOSIS — Z720 Tobacco use: Secondary | ICD-10-CM | POA: Diagnosis not present

## 2016-07-08 DIAGNOSIS — Z955 Presence of coronary angioplasty implant and graft: Secondary | ICD-10-CM

## 2016-07-08 DIAGNOSIS — Z91048 Other nonmedicinal substance allergy status: Secondary | ICD-10-CM

## 2016-07-08 DIAGNOSIS — Z79899 Other long term (current) drug therapy: Secondary | ICD-10-CM

## 2016-07-08 DIAGNOSIS — Z9114 Patient's other noncompliance with medication regimen: Secondary | ICD-10-CM | POA: Diagnosis not present

## 2016-07-08 DIAGNOSIS — I10 Essential (primary) hypertension: Secondary | ICD-10-CM | POA: Diagnosis not present

## 2016-07-08 DIAGNOSIS — Z716 Tobacco abuse counseling: Secondary | ICD-10-CM

## 2016-07-08 DIAGNOSIS — R200 Anesthesia of skin: Secondary | ICD-10-CM | POA: Diagnosis not present

## 2016-07-08 DIAGNOSIS — I25110 Atherosclerotic heart disease of native coronary artery with unstable angina pectoris: Principal | ICD-10-CM | POA: Diagnosis present

## 2016-07-08 DIAGNOSIS — Z91041 Radiographic dye allergy status: Secondary | ICD-10-CM

## 2016-07-08 DIAGNOSIS — Z681 Body mass index (BMI) 19 or less, adult: Secondary | ICD-10-CM

## 2016-07-08 DIAGNOSIS — F319 Bipolar disorder, unspecified: Secondary | ICD-10-CM | POA: Diagnosis present

## 2016-07-08 DIAGNOSIS — R112 Nausea with vomiting, unspecified: Secondary | ICD-10-CM | POA: Diagnosis present

## 2016-07-08 DIAGNOSIS — I5022 Chronic systolic (congestive) heart failure: Secondary | ICD-10-CM | POA: Diagnosis not present

## 2016-07-08 DIAGNOSIS — Z7982 Long term (current) use of aspirin: Secondary | ICD-10-CM

## 2016-07-08 DIAGNOSIS — E875 Hyperkalemia: Secondary | ICD-10-CM | POA: Diagnosis present

## 2016-07-08 DIAGNOSIS — R079 Chest pain, unspecified: Secondary | ICD-10-CM | POA: Diagnosis not present

## 2016-07-08 DIAGNOSIS — Z8249 Family history of ischemic heart disease and other diseases of the circulatory system: Secondary | ICD-10-CM

## 2016-07-08 DIAGNOSIS — Z7902 Long term (current) use of antithrombotics/antiplatelets: Secondary | ICD-10-CM

## 2016-07-08 DIAGNOSIS — I252 Old myocardial infarction: Secondary | ICD-10-CM

## 2016-07-08 DIAGNOSIS — E785 Hyperlipidemia, unspecified: Secondary | ICD-10-CM | POA: Diagnosis present

## 2016-07-08 DIAGNOSIS — F1410 Cocaine abuse, uncomplicated: Secondary | ICD-10-CM | POA: Diagnosis present

## 2016-07-08 DIAGNOSIS — I200 Unstable angina: Secondary | ICD-10-CM | POA: Diagnosis present

## 2016-07-08 DIAGNOSIS — E876 Hypokalemia: Secondary | ICD-10-CM | POA: Diagnosis present

## 2016-07-08 DIAGNOSIS — F32A Depression, unspecified: Secondary | ICD-10-CM | POA: Diagnosis present

## 2016-07-08 DIAGNOSIS — Z7151 Drug abuse counseling and surveillance of drug abuser: Secondary | ICD-10-CM

## 2016-07-08 DIAGNOSIS — F419 Anxiety disorder, unspecified: Secondary | ICD-10-CM | POA: Diagnosis present

## 2016-07-08 DIAGNOSIS — E440 Moderate protein-calorie malnutrition: Secondary | ICD-10-CM | POA: Diagnosis present

## 2016-07-08 LAB — BASIC METABOLIC PANEL
Anion gap: 12 (ref 5–15)
BUN: 6 mg/dL (ref 6–20)
CO2: 20 mmol/L — AB (ref 22–32)
CREATININE: 1.14 mg/dL (ref 0.61–1.24)
Calcium: 9.4 mg/dL (ref 8.9–10.3)
Chloride: 102 mmol/L (ref 101–111)
GFR calc non Af Amer: >60 mL/min (ref >60)
Glucose, Bld: 101 mg/dL — ABNORMAL HIGH (ref 65–99)
Potassium: 5.9 mmol/L — ABNORMAL HIGH (ref 3.5–5.1)
Sodium: 134 mmol/L — ABNORMAL LOW (ref 135–145)

## 2016-07-08 LAB — CBC
HCT: 42.3 % (ref 39.0–52.0)
Hemoglobin: 14.7 g/dL (ref 13.0–17.0)
MCH: 32.2 pg (ref 26.0–34.0)
MCHC: 34.8 g/dL (ref 30.0–36.0)
MCV: 92.8 fL (ref 78.0–100.0)
PLATELETS: 225 10*3/uL (ref 150–400)
RBC: 4.56 MIL/uL (ref 4.22–5.81)
RDW: 17.2 % — ABNORMAL HIGH (ref 11.5–15.5)
WBC: 4.6 10*3/uL (ref 4.0–10.5)

## 2016-07-08 LAB — LIPASE, BLOOD: LIPASE: 25 U/L (ref 11–51)

## 2016-07-08 LAB — TROPONIN I

## 2016-07-08 LAB — I-STAT TROPONIN, ED: Troponin i, poc: 0 ng/mL (ref 0.00–0.08)

## 2016-07-08 LAB — POTASSIUM: Potassium: 3.7 mmol/L (ref 3.5–5.1)

## 2016-07-08 LAB — BRAIN NATRIURETIC PEPTIDE: B Natriuretic Peptide: 23.1 pg/mL (ref 0.0–100.0)

## 2016-07-08 MED ORDER — METOPROLOL SUCCINATE ER 25 MG PO TB24
25.0000 mg | ORAL_TABLET | Freq: Every day | ORAL | Status: DC
  Administered 2016-07-10 – 2016-07-12 (×3): 25 mg via ORAL
  Filled 2016-07-08 (×4): qty 1

## 2016-07-08 MED ORDER — SODIUM CHLORIDE 0.9 % IV SOLN
INTRAVENOUS | Status: DC
  Administered 2016-07-09 – 2016-07-10 (×3): via INTRAVENOUS

## 2016-07-08 MED ORDER — ONDANSETRON HCL 4 MG/2ML IJ SOLN: 4.0000 mg | Freq: Three times a day (TID) | INTRAMUSCULAR | Status: DC | PRN

## 2016-07-08 MED ORDER — HEPARIN (PORCINE) IN NACL 100-0.45 UNIT/ML-% IJ SOLN
850.0000 [IU]/h | INTRAMUSCULAR | Status: DC
  Administered 2016-07-08: 700 [IU]/h via INTRAVENOUS
  Administered 2016-07-10 – 2016-07-11 (×2): 850 [IU]/h via INTRAVENOUS
  Filled 2016-07-08 (×4): qty 250

## 2016-07-08 MED ORDER — DICYCLOMINE HCL 20 MG PO TABS
20.0000 mg | ORAL_TABLET | Freq: Two times a day (BID) | ORAL | Status: DC
  Administered 2016-07-09 – 2016-07-12 (×8): 20 mg via ORAL
  Filled 2016-07-08 (×8): qty 1

## 2016-07-08 MED ORDER — HYDRALAZINE HCL 20 MG/ML IJ SOLN: 5.0000 mg | INTRAMUSCULAR | Status: DC | PRN

## 2016-07-08 MED ORDER — TRAZODONE HCL 100 MG PO TABS: 100.0000 mg | ORAL_TABLET | Freq: Every evening | ORAL | Status: DC | PRN

## 2016-07-08 MED ORDER — LORAZEPAM 2 MG/ML IJ SOLN
1.0000 mg | Freq: Once | INTRAMUSCULAR | Status: AC
  Administered 2016-07-09: 1 mg via INTRAVENOUS
  Filled 2016-07-08: qty 1

## 2016-07-08 MED ORDER — ONDANSETRON HCL 4 MG/2ML IJ SOLN
4.0000 mg | Freq: Once | INTRAMUSCULAR | Status: AC
  Administered 2016-07-08: 4 mg via INTRAVENOUS
  Filled 2016-07-08: qty 2

## 2016-07-08 MED ORDER — HEPARIN BOLUS VIA INFUSION
2000.0000 [IU] | Freq: Once | INTRAVENOUS | Status: AC
  Administered 2016-07-08: 2000 [IU] via INTRAVENOUS
  Filled 2016-07-08: qty 2000

## 2016-07-08 MED ORDER — NITROGLYCERIN 0.4 MG SL SUBL: 0.4000 mg | SUBLINGUAL_TABLET | SUBLINGUAL | Status: DC | PRN

## 2016-07-08 MED ORDER — TICAGRELOR 90 MG PO TABS
90.0000 mg | ORAL_TABLET | Freq: Two times a day (BID) | ORAL | Status: DC
  Administered 2016-07-09 – 2016-07-12 (×8): 90 mg via ORAL
  Filled 2016-07-08 (×8): qty 1

## 2016-07-08 MED ORDER — FAMOTIDINE 20 MG PO TABS
20.0000 mg | ORAL_TABLET | Freq: Two times a day (BID) | ORAL | Status: DC
  Administered 2016-07-09 – 2016-07-12 (×8): 20 mg via ORAL
  Filled 2016-07-08 (×8): qty 1

## 2016-07-08 MED ORDER — ARIPIPRAZOLE 5 MG PO TABS
5.0000 mg | ORAL_TABLET | Freq: Two times a day (BID) | ORAL | Status: DC
  Administered 2016-07-09 – 2016-07-12 (×8): 5 mg via ORAL
  Filled 2016-07-08 (×9): qty 1

## 2016-07-08 MED ORDER — NICOTINE 21 MG/24HR TD PT24
21.0000 mg | MEDICATED_PATCH | Freq: Every day | TRANSDERMAL | Status: DC
  Administered 2016-07-09 – 2016-07-12 (×4): 21 mg via TRANSDERMAL
  Filled 2016-07-08 (×4): qty 1

## 2016-07-08 MED ORDER — ACETAMINOPHEN 325 MG PO TABS: 650.0000 mg | ORAL_TABLET | ORAL | Status: DC | PRN

## 2016-07-08 MED ORDER — LISINOPRIL 5 MG PO TABS
5.0000 mg | ORAL_TABLET | Freq: Every day | ORAL | Status: DC
  Administered 2016-07-09 – 2016-07-12 (×4): 5 mg via ORAL
  Filled 2016-07-08 (×4): qty 1

## 2016-07-08 MED ORDER — ASPIRIN EC 81 MG PO TBEC
81.0000 mg | DELAYED_RELEASE_TABLET | Freq: Every day | ORAL | Status: DC
  Administered 2016-07-09 – 2016-07-11 (×4): 81 mg via ORAL
  Filled 2016-07-08 (×4): qty 1

## 2016-07-08 MED ORDER — ZOLPIDEM TARTRATE 5 MG PO TABS
5.0000 mg | ORAL_TABLET | Freq: Every evening | ORAL | Status: DC | PRN
  Administered 2016-07-09 – 2016-07-11 (×3): 5 mg via ORAL
  Filled 2016-07-08 (×3): qty 1

## 2016-07-08 MED ORDER — ATORVASTATIN CALCIUM 80 MG PO TABS
80.0000 mg | ORAL_TABLET | Freq: Every day | ORAL | Status: DC
  Administered 2016-07-09 – 2016-07-11 (×3): 80 mg via ORAL
  Filled 2016-07-08 (×3): qty 1

## 2016-07-08 NOTE — ED Triage Notes (Signed)
Per EMS, patient coming from home complaining of Chest pain x 2 days.  Gotten worse today.  7/10 pain. Also Complaining of diaphoresis, sob, nausea, vomiting, and leg numbness.  3-4 stents and 7 MIs.  Actively vomiting.  Given 324 aspirin, 2 Nitro, and 4mg  of odt zofran en route.  Lungs clear.  114/87 (after nitro), 24 RR, HR 60, 100% RA.

## 2016-07-08 NOTE — H&P (Signed)
History and Physical    [name] NWG:[id number] DOB: [birth year] DOA: 07/08/2016  Referring MD/NP/PA:   PCP: Massie Maroon, FNP   Patient coming from:  The patient is coming from home.  At baseline, pt is independent for most of ADL.  Chief Complaint: chest pain, left leg numbness and nausea vomiting  HPI: [name] is a 41 y.o. male with medical history significant of hypertension, hyperlipidemia, GERD, depression, anxiety, tobacco abuse, marijuana abuse, NSVT, CAD, STEMI, s/p of multiple stent placement, bipolar disorder, sCHF, medication noncompliance, who presents with chest pain, left leg numbness and nausea vomiting.  Patient states that he has has not taken his home medications in the past 2 weeks. He develops chest pain which has been going on for 2 days. The chest is located in the substernal area, intermittent, 7 out of 10 in safety, pressure-like pain, nonradiating. It happens 3-4 times each day. It is associated with SOB. He has dry cough. No tenderness in the calf area. He also reports left leg numbness which has been going on for 3 weeks. No unilateral weakness, vision change, hearing loss, slurred speech or facial droop. Patient states that he has nausea and vomited 3 times today. No diarrhea. He has left lower quadrant abdominal pain earlier, which has resolved. Denies symptoms of UTI.  ED Course: pt was found to have negative troponin, BNP 23.1, lipase 25, WBC 4.6, creatinine 1.14, temperature normal, bradycardia, O2 sat 91-97% on room air, negative chest x-ray for acute abnormalities. Patient is placed on telemetry bed for observation.  Review of Systems:   General: no fevers, chills, no changes in body weight, has poor appetite, has fatigue HEENT: no blurry vision, hearing changes or sore throat Respiratory: has dyspnea, coughing, no wheezing CV: has chest pain, no palpitations GI: has nausea, vomiting, abdominal pain, no diarrhea, constipation GU: no  dysuria, burning on urination, increased urinary frequency, hematuria  Ext: no leg edema Neuro: has left leg numbness. No vision change or hearing loss Skin: no rash, no skin tear. MSK: No muscle spasm, no deformity, no limitation of range of movement in spin Heme: No easy bruising.  Travel history: No recent long distant travel.  Allergy:  Allergies  Allergen Reactions  . Iodine Anaphylaxis and Swelling  . Shellfish Allergy Anaphylaxis    All shellfish  . Contrast Media [Iodinated Diagnostic Agents] Nausea And Vomiting    Past Medical History:  Diagnosis Date  . Anxiety   . Bipolar 1 disorder (HCC)   . CAD S/P percutaneous coronary angioplasty    a. Inf STEMI s/p BMS to RCA 01/2007. b. NSTEMI 07/2013 s/p PTCA to RCA for ISR; c. Transient inferior ST elevation (peak Ti 0.25) 01/2014 s/p PTCA/DES to pRCA, PTCA/DES to dRCA, EF 50%; d. 12/2014 Inf STEMI: RCA patent prox stent, 143m/d (3.0x32 Synergy DES), EF 35-45; e. 05/2015 Inf STEMI/Cath: LM nl, LAD 10ost/m, D1 75, LCX nl, OM1 25, OM2 30, RCA patent stents, RPDA 30ost.  . Hx of medication noncompliance   . Hyperlipidemia   . Hypertensive heart disease   . Ischemic cardiomyopathy    a. EF 40% in 2011, 60% in 2012. b. EF 55% by cath 07/2013. c. EF 50% by cath 01/2014; d. 12/2014 EF 35-45% by LV gram; e. 05/2015 Echo: EF 50-55% inflat/inf AK, mild AI; f. 06/2015 cMRI: EF 49%, basal & mid inf full thickness scar, subendocardial scar in antsept and antlat wall, correlating w/ D1 dzs.  Marland Kitchen NSVT (nonsustained ventricular tachycardia) (HCC)  a. Very brief run during 07/2013 admit for NSTEMI felt due to MI.  . Polysubstance abuse    a. h/o tobacco, marijuana and crack cocaine use. b. 07/2013: +UDS THC, neg for cocaine.  . Tobacco abuse     Past Surgical History:  Procedure Laterality Date  . CARDIAC CATHETERIZATION  01/16/2009   normal left main, Cfx with 2-OMs both w/minor irregularities, LAd with 20-30% mid region irregularities, ramus  intermediate/optional diagonal with 60% osital narrowing, RCA with stent in distal portion w/20% prox in-stent stenosis (Dr. Mervyn SkeetersA. Little)  . CARDIAC CATHETERIZATION  07/23/2013   two vessel obstructive CAD, occluded first diagonal, focal in-stent restenosis in distal RCA (Dr. Peter SwazilandJordan)  . CARDIAC CATHETERIZATION N/A 01/10/2015   Procedure: Left Heart Cath and Coronary Angiography;  Surgeon: Marykay Lexavid W Harding, MD;  [facility];  Service: Cardiovascular;  Laterality: N/A;  . CARDIAC CATHETERIZATION N/A 01/10/2015   Procedure: Coronary Stent Intervention;  Surgeon: Marykay Lexavid W Harding, MD;  [facility];  Service: Cardiovascular;  Laterality: N/A;  . CARDIAC CATHETERIZATION N/A 06/10/2015   Procedure: Left Heart Cath and Coronary Angiography;  Surgeon: Peter M SwazilandJordan, MD;  [facility];  Service: Cardiovascular;  Laterality: N/A;  . CARDIAC CATHETERIZATION N/A 09/27/2015   Procedure: Left Heart Cath and Coronary Angiography;  Surgeon: Corky CraftsJayadeep S Varanasi, MD;  [facility];  Service: Cardiovascular;  Laterality: N/A;  . CARDIAC CATHETERIZATION N/A 09/27/2015   Procedure: Coronary Balloon Angioplasty;  Surgeon: Corky CraftsJayadeep S Varanasi, MD;  Location: [facility] LAB;  Service: Cardiovascular;  Laterality: N/A;  . CORONARY ANGIOPLASTY WITH STENT PLACEMENT  02/05/2007   3.5x3718mm Quantum non-DES to RCA (Dr. Nicki Guadalajarahomas Kelly)  . FEMORAL ARTERY STENT    . LEFT AND RIGHT HEART CATHETERIZATION WITH CORONARY ANGIOGRAM N/A 01/14/2014   Procedure: LEFT AND RIGHT HEART CATHETERIZATION WITH CORONARY ANGIOGRAM;  Surgeon: Kathleene Hazelhristopher D McAlhany, MD;  [facility];  Service: Cardiovascular;  Laterality: N/A;  . LEFT HEART CATHETERIZATION WITH CORONARY ANGIOGRAM N/A 07/23/2013   Procedure: LEFT HEART CATHETERIZATION WITH CORONARY ANGIOGRAM;  Surgeon: Peter M SwazilandJordan, MD;  [facility];  Service: Cardiovascular;  Laterality: N/A;    Social History:   reports that he has been smoking Cigarettes.  He has a 7.50 pack-year smoking history. He has never used smokeless tobacco. He reports that he drinks alcohol. He reports that he uses drugs, including Marijuana.  Family History:  Family History  Problem Relation Age of Onset  . CAD Mother   . Heart disease Mother   . Heart attack Mother   . Schizophrenia Mother   . CAD Sister      Prior to Admission medications   Medication Sig Start Date End Date Taking? Authorizing Provider  ARIPiprazole (ABILIFY) 5 MG tablet Take 1 tablet (5 mg total) by mouth 2 (two) times daily. 06/09/16  Yes Rise MuLeaphart, Kenneth T, PA-C  aspirin 81 MG EC tablet Take 1 tablet (81 mg total) by mouth daily. 03/15/16  Yes Charm RingsLord, Jamison Y, NP  atorvastatin (LIPITOR) 80 MG tablet Take 1 tablet (80 mg total) by mouth daily at 6 PM. 03/15/16  Yes Lord, Herminio HeadsJamison Y, NP  dicyclomine (BENTYL) 20 MG tablet Take 1 tablet (20 mg total) by mouth 2 (two) times daily. 06/09/16  Yes Rise MuLeaphart, Kenneth T, PA-C  famotidine (PEPCID) 20 MG tablet Take 1 tablet (20 mg total) by mouth 2 (two) times daily. 03/15/16  Yes Charm RingsLord, Jamison Y,  NP  lisinopril (PRINIVIL,ZESTRIL) 5 MG tablet Take 1 tablet (5 mg total) by mouth daily. 03/15/16  Yes Lord, Herminio Heads, NP  nitroGLYCERIN (NITROSTAT) 0.4 MG SL tablet Place 1 tablet (0.4 mg total) under the tongue every 5 (five) minutes x 3 doses as needed for chest pain. 03/15/16  Yes Charm Rings, NP  ticagrelor (BRILINTA) 90 MG TABS tablet Take 1 tablet (90 mg total) by mouth 2 (two) times daily. 03/15/16  Yes Charm Rings, NP  traZODone (DESYREL) 100 MG tablet Take 1 tablet (100 mg total) by mouth at bedtime as needed for sleep. 03/15/16  Yes Charm Rings, NP  loperamide (IMODIUM) 2 MG capsule Take 1 capsule (2 mg total) by mouth 4 (four) times daily as needed for diarrhea or loose stools. Patient not taking: Reported on 03/13/2016 11/08/15   de Villier, Daryl F II, PA  metoprolol succinate (TOPROL-XL) 25 MG 24 hr tablet  Take 1 tablet (25 mg total) by mouth daily. 03/15/16   Charm Rings, NP  Multiple Vitamin (MULTIVITAMIN WITH MINERALS) TABS tablet Take 1 tablet by mouth daily. Patient not taking: Reported on 03/13/2016 10/01/15   Leone Brand, NP  naproxen (NAPROSYN) 500 MG tablet Take 1 tablet (500 mg total) by mouth 2 (two) times daily. Patient not taking: Reported on 06/09/2016 03/23/16   Horton, Mayer Masker, MD  promethazine (PHENERGAN) 25 MG tablet Take 1 tablet (25 mg total) by mouth every 6 (six) hours as needed for nausea or vomiting. Patient not taking: Reported on 07/08/2016 06/09/16   Rise Mu, PA-C    Physical Exam: Vitals:   07/08/16 2300 07/08/16 2330 07/08/16 2345 07/09/16 0033  BP: 121/80 134/80 110/72 128/90  Pulse: 64 (!) 51 (!) 48 62  Resp: 15 (!) 25 15 16   Temp:    98.5 F (36.9 C)  TempSrc:    Oral  SpO2: 91% 97% 93% 93%  Weight:    50.6 kg (111 lb 9.6 oz)  Height:    5\' 11"  (1.803 m)   General: Not in acute distress HEENT:       Eyes: PERRL, EOMI, no scleral icterus.       ENT: No discharge from the ears and nose, no pharynx injection, no tonsillar enlargement.        Neck: No JVD, no bruit, no mass felt. Heme: No neck lymph node enlargement. Cardiac: S1/S2, RRR, No murmurs, No gallops or rubs. Respiratory: No rales, wheezing, rhonchi or rubs. GI: Soft, nondistended, nontender, no rebound pain, no organomegaly, BS present. GU: No hematuria Ext: No pitting leg edema bilaterally. 2+DP/PT pulse bilaterally. Musculoskeletal: No joint deformities, No joint redness or warmth, no limitation of ROM in spin. Skin: No rashes.  Neuro: Alert, oriented X3, cranial nerves II-XII grossly intact, moves all extremities normally. Muscle strength 5/5 in all extremities, sensation to light touch intact. Brachial reflex 2+ bilaterally. Negative Babinski's sign. Psych: Patient is not psychotic, no suicidal or hemocidal ideation.  Labs on Admission: I have personally reviewed following  labs and imaging studies  CBC:  Recent Labs Lab 07/08/16 1751 07/09/16 0036  WBC 4.6 4.7  HGB 14.7 14.2  HCT 42.3 40.8  MCV 92.8 91.9  PLT 225 218   Basic Metabolic Panel:  Recent Labs Lab 07/08/16 1751 07/08/16 2015  NA 134*  --   K 5.9* 3.7  CL 102  --   CO2 20*  --   GLUCOSE 101*  --   BUN 6  --  CREATININE 1.14  --   CALCIUM 9.4  --    GFR: Estimated Creatinine Clearance: 62.9 mL/min (by C-G formula based on SCr of 1.14 mg/dL). Liver Function Tests: No results for input(s): AST, ALT, ALKPHOS, BILITOT, PROT, ALBUMIN in the last 168 hours.  Recent Labs Lab 07/08/16 2259  LIPASE 25   No results for input(s): AMMONIA in the last 168 hours. Coagulation Profile: No results for input(s): INR, PROTIME in the last 168 hours. Cardiac Enzymes:  Recent Labs Lab 07/08/16 2259  TROPONINI <0.03   BNP (last 3 results) No results for input(s): PROBNP in the last 8760 hours. HbA1C: No results for input(s): HGBA1C in the last 72 hours. CBG: No results for input(s): GLUCAP in the last 168 hours. Lipid Profile:  Recent Labs  07/09/16 0036  CHOL 262*  HDL 47  LDLCALC 202*  TRIG 66  CHOLHDL 5.6   Thyroid Function Tests: No results for input(s): TSH, T4TOTAL, FREET4, T3FREE, THYROIDAB in the last 72 hours. Anemia Panel: No results for input(s): VITAMINB12, FOLATE, FERRITIN, TIBC, IRON, RETICCTPCT in the last 72 hours. Urine analysis:    Component Value Date/Time   COLORURINE AMBER (A) 11/08/2015 1529   APPEARANCEUR CLEAR 11/08/2015 1529   LABSPEC 1.042 (H) 11/08/2015 1529   PHURINE 6.0 11/08/2015 1529   GLUCOSEU NEGATIVE 11/08/2015 1529   HGBUR NEGATIVE 11/08/2015 1529   HGBUR negative 01/27/2009 1039   BILIRUBINUR MODERATE (A) 11/08/2015 1529   KETONESUR 40 (A) 11/08/2015 1529   PROTEINUR 30 (A) 11/08/2015 1529   UROBILINOGEN 0.2 07/07/2015 1150   NITRITE NEGATIVE 11/08/2015 1529   LEUKOCYTESUR NEGATIVE 11/08/2015 1529   Sepsis  Labs: @LABRCNTIP (procalcitonin:4,lacticidven:4) )No results found for this or any previous visit (from the past 240 hour(s)).   Radiological Exams on Admission: Dg Chest 2 View  Result Date: 07/08/2016 CLINICAL DATA:  Chest pain EXAM: CHEST  2 VIEW COMPARISON:  11/19/2015 FINDINGS: Hyperinflation. No focal pulmonary infiltrate, consolidation, or pleural effusion. Normal cardiomediastinal silhouette. No pneumothorax. IMPRESSION: No active cardiopulmonary disease. Electronically Signed   By: Jasmine Pang M.D.   On: 07/08/2016 18:25   [name] Contrast  Result Date: 07/09/2016 CLINICAL DATA:  41 y/o  M; left leg numbness. EXAM: MRI HEAD WITHOUT CONTRAST TECHNIQUE: Multiplanar, multiecho pulse sequences of the brain and surrounding structures were obtained without intravenous contrast. COMPARISON:  None. FINDINGS: Brain: No acute infarction, hemorrhage, hydrocephalus, extra-axial collection or mass lesion. Vascular: Normal flow voids. Skull and upper cervical spine: Normal marrow signal. Sinuses/Orbits: Negative. Other: None. IMPRESSION: Normal MRI of the brain. Electronically Signed   By: Mitzi Hansen M.D.   On: 07/09/2016 02:31     EKG: Independently reviewed.  Sinus rhythm, QTC 432, LVH, incomplete right bundle blockage  Assessment/Plan Principal Problem:   Chest pain Active Problems:   Depression   Essential hypertension   HLD (hyperlipidemia)   Hx of medication noncompliance   Tobacco abuse   Nausea with vomiting   Tetrahydrocannabinol (THC) use disorder, severe, dependence (HCC)   Chronic systolic CHF (congestive heart failure) (HCC)   Left leg numbness   Chest pain: Patient's CP has been going on intermittently for 2 days, given his very significant history of CAD, 3 stent placement and 7  MI, very concerning for unstable angina. Patient is not taking any home medications.  - will place on Tele bed for obs -start IV heparin - cycle CE q6 x3 and repeat EKG in the  am  - prn Nitroglycerin, Morphine, and aspirin, lipitor,  metoprolol, brillinta - Risk factor stratification: will check FLP, UDS and A1C  - 2d echo - please call Card in AM  Chronic systolic CHF: 2-D echo on 09/27/15 showed EF of 40-45%. Patient does not have leg edema JVD. CHF is compensated. Patient is not taking diuretics at home. -Check BNP  Depression: Stable, no suicidal or homicidal ideations. -Continue home medications: Abilify  HTN: -Metoprolol, lisinopril, -IV hydralazine when necessary  HLD: -lipitor  Tobacco abuse and Tetrahydrocannabinol abuse -Did counseling about importance of quitting smoking and marijuana -Nicotine patch  Nausea with vomiting: Likely due to marijuana abuse. Lipase normal. No acute abdomen on physical examination. Patient had left lower quadrant abdominal pain, which has resolved currently. No diarrhea. When necessary Zofran for nausea.-  Left leg numbness: Etiology is not clear. No back pain -MRI-brain to rule out stroke -check vitamin B12 and TSH  DVT ppx: on IV Heparin   Code Status: Full code Family Communication: None at bed side.     Disposition Plan:  Anticipate discharge back to previous home environment Consults called:  none Admission status: Obs / tele     Date of Service 07/09/2016    Lorretta Harp [facility] Pager [phone]  If 7PM-7AM, please contact night-coverage [email or web address] Password [facility] 07/09/2016, 6:23 AM

## 2016-07-08 NOTE — ED Notes (Signed)
Two unsuccessful attempts at blood draw. 

## 2016-07-08 NOTE — ED Notes (Signed)
Patient given sprite to drink. 

## 2016-07-08 NOTE — Progress Notes (Addendum)
ANTICOAGULATION CONSULT NOTE - Initial Consult  Pharmacy Consult for heparin Indication: chest pain/ACS  Allergies  Allergen Reactions  . Iodine Anaphylaxis and Swelling  . Shellfish Allergy Anaphylaxis    All shellfish  . Contrast Media [Iodinated Diagnostic Agents] Nausea And Vomiting    Patient Measurements: Height: 6' (182.9 cm) Weight: 115 lb (52.2 kg) IBW/kg (Calculated) : 77.6 Heparin Dosing Weight: 52kg  Vital Signs: Temp: 97.6 F (36.4 C) (06/28 1745) Temp Source: Oral (06/28 1745) BP: 124/82 (06/28 2200) Pulse Rate: 59 (06/28 2200)  Labs:  Recent Labs  07/08/16 1751  HGB 14.7  HCT 42.3  PLT 225  CREATININE 1.14    Estimated Creatinine Clearance: 64.9 mL/min (by C-G formula based on SCr of 1.14 mg/dL).   Medical History: Past Medical History:  Diagnosis Date  . Anxiety   . Bipolar 1 disorder (HCC)   . CAD S/P percutaneous coronary angioplasty    a. Inf STEMI s/p BMS to RCA 01/2007. b. NSTEMI 07/2013 s/p PTCA to RCA for ISR; c. Transient inferior ST elevation (peak Ti 0.25) 01/2014 s/p PTCA/DES to pRCA, PTCA/DES to dRCA, EF 50%; d. 12/2014 Inf STEMI: RCA patent prox stent, 13473m/d (3.0x32 Synergy DES), EF 35-45; e. 05/2015 Inf STEMI/Cath: LM nl, LAD 10ost/m, D1 75, LCX nl, OM1 25, OM2 30, RCA patent stents, RPDA 30ost.  . Hx of medication noncompliance   . Hyperlipidemia   . Hypertensive heart disease   . Ischemic cardiomyopathy    a. EF 40% in 2011, 60% in 2012. b. EF 55% by cath 07/2013. c. EF 50% by cath 01/2014; d. 12/2014 EF 35-45% by LV gram; e. 05/2015 Echo: EF 50-55% inflat/inf AK, mild AI; f. 06/2015 cMRI: EF 49%, basal & mid inf full thickness scar, subendocardial scar in antsept and antlat wall, correlating w/ D1 dzs.  Marland Kitchen. NSVT (nonsustained ventricular tachycardia) (HCC)    a. Very brief run during 07/2013 admit for NSTEMI felt due to MI.  . Polysubstance abuse    a. h/o tobacco, marijuana and crack cocaine use. b. 07/2013: +UDS THC, neg for cocaine.  .  Tobacco abuse      Assessment: [age] YOM with chest pain x2 days. First troponin is negative. He is not on anticoagulation PTA. CBC is wnl.  Goal of Therapy:  Heparin level 0.3-0.7 units/ml Monitor platelets by anticoagulation protocol: Yes   Plan:  Heparin bolus with 2000 units IV x1, then start infusion at 700 units/hr Daily heparin level and CBC Follow cardiology plan  Amarisa Wilinski D. Joesph Marcy, PharmD, BCPS Clinical Pharmacist [phone] 07/08/2016 11:03 PM

## 2016-07-08 NOTE — ED Provider Notes (Signed)
[facility]-EMERGENCY DEPT Provider Note   CSN: [id number] Arrival date & time: 07/08/16  1731     History   Chief Complaint Chief Complaint  Patient presents with  . Chest Pain  . Emesis    HPI [name] is a 41 y.o. male.  HPI  Patient is a 41 year old male past medical history significant for bipolar disorder, CAD with multiple stents, ischemic cardiomyopathy, HTN, HLD, tobacco use, who presents to the emergency department with a one-day history of chest pain. Chest pain started while he was at rest. Describes a dull substernal chest pressure, nonradiating, constant. Getting in an argument and ambulating worsens the pain. Improved with nitroglycerin that he received with EMS. Associated with nausea, vomiting, diaphoresis, shortness of breath. States that his chest pain started prior to emesis, no hematemesis. Denies abdominal pain, fever or chills. Endorses marijuana use, last used marijuana today. Denies cocaine or methamphetamine use. States that he has been noncompliant with his home medications including Brilinta, Plavix, aspirin secondary to inability to afford them.   Past Medical History:  Diagnosis Date  . Anxiety   . Bipolar 1 disorder (HCC)   . CAD S/P percutaneous coronary angioplasty    a. Inf STEMI s/p BMS to RCA 01/2007. b. NSTEMI 07/2013 s/p PTCA to RCA for ISR; c. Transient inferior ST elevation (peak Ti 0.25) 01/2014 s/p PTCA/DES to pRCA, PTCA/DES to dRCA, EF 50%; d. 12/2014 Inf STEMI: RCA patent prox stent, 13741m/d (3.0x32 Synergy DES), EF 35-45; e. 05/2015 Inf STEMI/Cath: LM nl, LAD 10ost/m, D1 75, LCX nl, OM1 25, OM2 30, RCA patent stents, RPDA 30ost.  . Hx of medication noncompliance   . Hyperlipidemia   . Hypertensive heart disease   . Ischemic cardiomyopathy    a. EF 40% in 2011, 60% in 2012. b. EF 55% by cath 07/2013. c. EF 50% by cath 01/2014; d. 12/2014 EF 35-45% by LV gram; e. 05/2015 Echo: EF 50-55% inflat/inf AK, mild AI; f. 06/2015 cMRI: EF 49%, basal & mid inf  full thickness scar, subendocardial scar in antsept and antlat wall, correlating w/ D1 dzs.  Marland Kitchen. NSVT (nonsustained ventricular tachycardia) (HCC)    a. Very brief run during 07/2013 admit for NSTEMI felt due to MI.  . Polysubstance abuse    a. h/o tobacco, marijuana and crack cocaine use. b. 07/2013: +UDS THC, neg for cocaine.  . Tobacco abuse     Patient Active Problem List   Diagnosis Date Noted  . Tetrahydrocannabinol (THC) use disorder, severe, dependence (HCC) 03/14/2016  . Malnutrition of moderate degree 09/28/2015  . Hyperlipidemia   . Schizoaffective disorder, bipolar type (HCC) 03/06/2015  . Nausea with vomiting 03/06/2015  . Hypertensive heart disease 01/11/2015  . ST elevation myocardial infarction (STEMI) of inferior wall (HCC) 01/10/2015  . ST elevation myocardial infarction (STEMI) of inferior wall, initial episode of care (HCC) 01/10/2015  . Medical non-compliance   . ST elevation myocardial infarction involving right coronary artery (HCC) 01/15/2014  . Hx of medication noncompliance   . NSVT (nonsustained ventricular tachycardia) (HCC)   . Ischemic cardiomyopathy   . Tobacco abuse   . Anxiety   . 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 DES PCI to RCA x 2 (3.0 mm x 22 mm Resolute - distal & proximal RCA) 01/14/2014    Class: Present on Admission  . Non-STEMI (non-ST elevated myocardial infarction) (HCC) 07/22/2013  . Atypical chest pain 07/21/2013  . Bipolar 1 disorder, depressed (HCC) 07/21/2013  . Chest pain 04/16/2013  .  HLD (hyperlipidemia) 04/16/2013  . Hyperlipidemia with target LDL less than 70 01/27/2009  . SUBSTANCE ABUSE 01/27/2009  . DEPRESSION 01/27/2009  . Essential hypertension 01/27/2009  . CAD S/P BMS PCI to RCA with PTCA for ISR --> followed by stent thrombosis - DES PCI 01/27/2009  . GASTRITIS 01/27/2009    Past Surgical History:  Procedure Laterality Date  . CARDIAC CATHETERIZATION  01/16/2009   normal left main, Cfx with 2-OMs both w/minor  irregularities, LAd with 20-30% mid region irregularities, ramus intermediate/optional diagonal with 60% osital narrowing, RCA with stent in distal portion w/20% prox in-stent stenosis (Dr. Mervyn Skeeters. [place])  . CARDIAC CATHETERIZATION  07/23/2013   two vessel obstructive CAD, occluded first diagonal, focal in-stent restenosis in distal RCA (Dr. Peter Swaziland)  . CARDIAC CATHETERIZATION N/A 01/10/2015   Procedure: Left Heart Cath and Coronary Angiography;  Surgeon: Marykay Lex, MD;  Location: [facility] INVASIVE CV LAB;  Service: Cardiovascular;  Laterality: N/A;  . CARDIAC CATHETERIZATION N/A 01/10/2015   Procedure: Coronary Stent Intervention;  Surgeon: Marykay Lex, MD;  Location: [facility] INVASIVE CV LAB;  Service: Cardiovascular;  Laterality: N/A;  . CARDIAC CATHETERIZATION N/A 06/10/2015   Procedure: Left Heart Cath and Coronary Angiography;  Surgeon: Peter M Swaziland, MD;  Location: [facility] INVASIVE CV LAB;  Service: Cardiovascular;  Laterality: N/A;  . CARDIAC CATHETERIZATION N/A 09/27/2015   Procedure: Left Heart Cath and Coronary Angiography;  Surgeon: Corky Crafts, MD;  Location: [facility] INVASIVE CV LAB;  Service: Cardiovascular;  Laterality: N/A;  . CARDIAC CATHETERIZATION N/A 09/27/2015   Procedure: Coronary Balloon Angioplasty;  Surgeon: Corky Crafts, MD;  Location: [facility] INVASIVE CV LAB;  Service: Cardiovascular;  Laterality: N/A;  . CORONARY ANGIOPLASTY WITH STENT PLACEMENT  02/05/2007   3.5x33mm Quantum non-DES to RCA (Dr. Nicki Guadalajara)  . FEMORAL ARTERY STENT    . LEFT AND RIGHT HEART CATHETERIZATION WITH CORONARY ANGIOGRAM N/A 01/14/2014   Procedure: LEFT AND RIGHT HEART CATHETERIZATION WITH CORONARY ANGIOGRAM;  Surgeon: Kathleene Hazel, MD;  Location: [facility] CATH LAB;  Service: Cardiovascular;  Laterality: N/A;  . LEFT HEART CATHETERIZATION WITH CORONARY ANGIOGRAM N/A 07/23/2013   Procedure: LEFT HEART CATHETERIZATION WITH CORONARY ANGIOGRAM;  Surgeon: Peter M Swaziland, MD;  Location: [facility] CATH LAB;   Service: Cardiovascular;  Laterality: N/A;       Home Medications    Prior to Admission medications   Medication Sig Start Date End Date Taking? Authorizing Provider  ARIPiprazole (ABILIFY) 5 MG tablet Take 1 tablet (5 mg total) by mouth 2 (two) times daily. 06/09/16  Yes Rise Mu, PA-C  aspirin 81 MG EC tablet Take 1 tablet (81 mg total) by mouth daily. 03/15/16  Yes Charm Rings, NP  atorvastatin (LIPITOR) 80 MG tablet Take 1 tablet (80 mg total) by mouth daily at 6 PM. 03/15/16  Yes Lord, Herminio Heads, NP  dicyclomine (BENTYL) 20 MG tablet Take 1 tablet (20 mg total) by mouth 2 (two) times daily. 06/09/16  Yes Rise Mu, PA-C  famotidine (PEPCID) 20 MG tablet Take 1 tablet (20 mg total) by mouth 2 (two) times daily. 03/15/16  Yes Charm Rings, NP  lisinopril (PRINIVIL,ZESTRIL) 5 MG tablet Take 1 tablet (5 mg total) by mouth daily. 03/15/16  Yes Lord, Herminio Heads, NP  nitroGLYCERIN (NITROSTAT) 0.4 MG SL tablet Place 1 tablet (0.4 mg total) under the tongue every 5 (five) minutes x 3 doses as needed for chest pain. 03/15/16  Yes Charm Rings, NP  ticagrelor Marden Noble)  90 MG TABS tablet Take 1 tablet (90 mg total) by mouth 2 (two) times daily. 03/15/16  Yes Charm Rings, NP  traZODone (DESYREL) 100 MG tablet Take 1 tablet (100 mg total) by mouth at bedtime as needed for sleep. 03/15/16  Yes Charm Rings, NP  loperamide (IMODIUM) 2 MG capsule Take 1 capsule (2 mg total) by mouth 4 (four) times daily as needed for diarrhea or loose stools. Patient not taking: Reported on 03/13/2016 11/08/15   de Villier, Daryl F II, PA  metoprolol succinate (TOPROL-XL) 25 MG 24 hr tablet Take 1 tablet (25 mg total) by mouth daily. 03/15/16   Charm Rings, NP  Multiple Vitamin (MULTIVITAMIN WITH MINERALS) TABS tablet Take 1 tablet by mouth daily. Patient not taking: Reported on 03/13/2016 10/01/15   Leone Brand, NP  naproxen (NAPROSYN) 500 MG tablet Take 1 tablet (500 mg total) by mouth 2 (two)  times daily. Patient not taking: Reported on 06/09/2016 03/23/16   Horton, Mayer Masker, MD  promethazine (PHENERGAN) 25 MG tablet Take 1 tablet (25 mg total) by mouth every 6 (six) hours as needed for nausea or vomiting. Patient not taking: Reported on 07/08/2016 06/09/16   Rise Mu, PA-C    Family History Family History  Problem Relation Age of Onset  . CAD Mother   . Heart disease Mother   . Heart attack Mother   . Schizophrenia Mother   . CAD Sister     Social History Social History  Substance Use Topics  . Smoking status: Current Every Day Smoker    Packs/day: 0.25    Years: 30.00    Types: Cigarettes  . Smokeless tobacco: Never Used  . Alcohol use 0.0 oz/week     Comment: 80 oz of beer weekly     Allergies   Iodine; Shellfish allergy; and Contrast media [iodinated diagnostic agents]   Review of Systems Review of Systems  Constitutional: Positive for chills and diaphoresis. Negative for appetite change and fever.  HENT: Negative for congestion.   Eyes: Negative for visual disturbance.  Respiratory: Positive for chest tightness and shortness of breath.   Cardiovascular: Positive for chest pain. Negative for palpitations and leg swelling.  Gastrointestinal: Positive for nausea and vomiting. Negative for abdominal pain and blood in stool.  Genitourinary: Negative for decreased urine volume, dysuria and flank pain.  Musculoskeletal: Negative for back pain.  Skin: Negative for rash.  Neurological: Negative for dizziness, seizures, weakness, light-headedness and headaches.  Psychiatric/Behavioral: Negative for behavioral problems.     Physical Exam Updated Vital Signs BP 124/82   Pulse (!) 59   Temp 97.6 F (36.4 C) (Oral)   Resp 18   Ht 6' (1.829 m)   Wt 52.2 kg (115 lb)   SpO2 97%   BMI 15.60 kg/m   Physical Exam  Constitutional: He is oriented to person, place, and time. He appears well-developed and well-nourished.  Diaphoretic. Thin appearing.    HENT:  Head: Atraumatic.  Mouth/Throat: Oropharynx is clear and moist.  Eyes: Conjunctivae and EOM are normal.  Neck: Normal range of motion. No JVD present.  Cardiovascular: Normal rate, regular rhythm, normal heart sounds and intact distal pulses.   No murmur heard. Pulmonary/Chest: Effort normal and breath sounds normal. No respiratory distress.  Abdominal: Soft. He exhibits no distension and no mass. There is no tenderness. There is no guarding.  Active emesis, nonbloody  Musculoskeletal: Normal range of motion. He exhibits no edema.  No unilateral leg swelling  Neurological: He is alert and oriented to person, place, and time.  Skin: Skin is warm. Capillary refill takes less than 2 seconds.  Psychiatric: He has a normal mood and affect.     ED Treatments / Results  Labs (all labs ordered are listed, but only abnormal results are displayed) Labs Reviewed  BASIC METABOLIC PANEL - Abnormal; Notable for the following:       Result Value   Sodium 134 (*)    Potassium 5.9 (*)    CO2 20 (*)    Glucose, Bld 101 (*)    All other components within normal limits  CBC - Abnormal; Notable for the following:    RDW 17.2 (*)    All other components within normal limits  POTASSIUM  I-STAT TROPOININ, ED    EKG  EKG Interpretation  Date/Time:  Thursday July 08 2016 22:02:01 EDT Ventricular Rate:  69 PR Interval:    QRS Duration: 115 QT Interval:  449 QTC Calculation: 481 R Axis:   55 Text Interpretation:  Sinus arrhythmia Probable left atrial enlargement Incomplete right bundle branch block Probable inferior infarct, old Borderline ST elevation, anterior leads When compared with ECG of EARLIER SAME DATE No significant change was found Confirmed by Dione Booze ([id number]) on 07/08/2016 10:50:43 PM       Radiology Dg Chest 2 View  Result Date: 07/08/2016 CLINICAL DATA:  Chest pain EXAM: CHEST  2 VIEW COMPARISON:  11/19/2015 FINDINGS: Hyperinflation. No focal pulmonary infiltrate,  consolidation, or pleural effusion. Normal cardiomediastinal silhouette. No pneumothorax. IMPRESSION: No active cardiopulmonary disease. Electronically Signed   By: Jasmine Pang M.D.   On: 07/08/2016 18:25    Procedures Procedures (including critical care time)  Medications Ordered in ED Medications  ondansetron (ZOFRAN) injection 4 mg (4 mg Intravenous Given 07/08/16 1757)     Initial Impression / Assessment and Plan / ED Course  I have reviewed the triage vital signs and the nursing notes.  Pertinent labs & imaging results that were available during my care of the patient were reviewed by me and considered in my medical decision making (see chart for details).     Patient is a 41 year old male past medical history significant for CAD, ischemic cardiomyopathy, who presents to the emergency department with a one-day history of typical chest pain. Given aspirin and nitroglycerin prior to arrival with resolution of his chest pain. Has been noncompliant with his antiplatelet medications secondary to inability to afford the medication.  On arrival patient is in obvious distress, active emesis. Given antiemetics. Initial EKG showed normal sinus rhythm, incomplete right bundle branch block, normal intervals, no signs of acute ischemia. Initial troponin negative. No significant electrolyte abnormalities. No leukocytosis. Chest x-ray showed no signs of pneumonia or pneumothorax, no pneumomediastinum. Doubt PE, PERC negative. Doubt dissection, no tearing chest pain, no neurologic deficit. Doubt Boerhaave's, emesis occurred a following the chest pain, no pneumomediastinum. Concern for ACS.  Patient made it to hospitalist, Dr. Clyde Lundborg, for further management of chest pain for monitoring and to continue to trend troponins. Patient stable for the floor.  Final Clinical Impressions(s) / ED Diagnoses   Final diagnoses:  Chest pain, unspecified type    New Prescriptions New Prescriptions   No  medications on file     Corena Herter, MD 07/08/16 2252    Benjiman Core, MD 07/09/16 0030

## 2016-07-09 ENCOUNTER — Observation Stay (HOSPITAL_COMMUNITY): Payer: Medicaid Other

## 2016-07-09 DIAGNOSIS — Z681 Body mass index (BMI) 19 or less, adult: Secondary | ICD-10-CM | POA: Diagnosis not present

## 2016-07-09 DIAGNOSIS — Z7982 Long term (current) use of aspirin: Secondary | ICD-10-CM | POA: Diagnosis not present

## 2016-07-09 DIAGNOSIS — I25110 Atherosclerotic heart disease of native coronary artery with unstable angina pectoris: Secondary | ICD-10-CM | POA: Diagnosis present

## 2016-07-09 DIAGNOSIS — E876 Hypokalemia: Secondary | ICD-10-CM | POA: Diagnosis present

## 2016-07-09 DIAGNOSIS — K219 Gastro-esophageal reflux disease without esophagitis: Secondary | ICD-10-CM | POA: Diagnosis present

## 2016-07-09 DIAGNOSIS — Z8249 Family history of ischemic heart disease and other diseases of the circulatory system: Secondary | ICD-10-CM | POA: Diagnosis not present

## 2016-07-09 DIAGNOSIS — I119 Hypertensive heart disease without heart failure: Secondary | ICD-10-CM | POA: Diagnosis not present

## 2016-07-09 DIAGNOSIS — F1410 Cocaine abuse, uncomplicated: Secondary | ICD-10-CM | POA: Diagnosis present

## 2016-07-09 DIAGNOSIS — I200 Unstable angina: Secondary | ICD-10-CM

## 2016-07-09 DIAGNOSIS — Z955 Presence of coronary angioplasty implant and graft: Secondary | ICD-10-CM | POA: Diagnosis not present

## 2016-07-09 DIAGNOSIS — R112 Nausea with vomiting, unspecified: Secondary | ICD-10-CM | POA: Diagnosis present

## 2016-07-09 DIAGNOSIS — I2510 Atherosclerotic heart disease of native coronary artery without angina pectoris: Secondary | ICD-10-CM | POA: Diagnosis not present

## 2016-07-09 DIAGNOSIS — F17210 Nicotine dependence, cigarettes, uncomplicated: Secondary | ICD-10-CM | POA: Diagnosis present

## 2016-07-09 DIAGNOSIS — R072 Precordial pain: Secondary | ICD-10-CM | POA: Diagnosis not present

## 2016-07-09 DIAGNOSIS — I97610 Postprocedural hemorrhage and hematoma of a circulatory system organ or structure following a cardiac catheterization: Secondary | ICD-10-CM | POA: Diagnosis present

## 2016-07-09 DIAGNOSIS — R079 Chest pain, unspecified: Secondary | ICD-10-CM | POA: Diagnosis not present

## 2016-07-09 DIAGNOSIS — Z720 Tobacco use: Secondary | ICD-10-CM | POA: Diagnosis not present

## 2016-07-09 DIAGNOSIS — F319 Bipolar disorder, unspecified: Secondary | ICD-10-CM | POA: Diagnosis present

## 2016-07-09 DIAGNOSIS — F419 Anxiety disorder, unspecified: Secondary | ICD-10-CM | POA: Diagnosis present

## 2016-07-09 DIAGNOSIS — E784 Other hyperlipidemia: Secondary | ICD-10-CM | POA: Diagnosis not present

## 2016-07-09 DIAGNOSIS — F1220 Cannabis dependence, uncomplicated: Secondary | ICD-10-CM | POA: Diagnosis present

## 2016-07-09 DIAGNOSIS — I10 Essential (primary) hypertension: Secondary | ICD-10-CM | POA: Diagnosis not present

## 2016-07-09 DIAGNOSIS — Z7902 Long term (current) use of antithrombotics/antiplatelets: Secondary | ICD-10-CM | POA: Diagnosis not present

## 2016-07-09 DIAGNOSIS — E875 Hyperkalemia: Secondary | ICD-10-CM | POA: Diagnosis present

## 2016-07-09 DIAGNOSIS — Z9114 Patient's other noncompliance with medication regimen: Secondary | ICD-10-CM | POA: Diagnosis not present

## 2016-07-09 DIAGNOSIS — F329 Major depressive disorder, single episode, unspecified: Secondary | ICD-10-CM | POA: Diagnosis not present

## 2016-07-09 DIAGNOSIS — E440 Moderate protein-calorie malnutrition: Secondary | ICD-10-CM | POA: Diagnosis present

## 2016-07-09 DIAGNOSIS — E785 Hyperlipidemia, unspecified: Secondary | ICD-10-CM | POA: Diagnosis present

## 2016-07-09 DIAGNOSIS — R200 Anesthesia of skin: Secondary | ICD-10-CM | POA: Diagnosis present

## 2016-07-09 DIAGNOSIS — I255 Ischemic cardiomyopathy: Secondary | ICD-10-CM | POA: Diagnosis present

## 2016-07-09 DIAGNOSIS — I110 Hypertensive heart disease with heart failure: Secondary | ICD-10-CM | POA: Diagnosis present

## 2016-07-09 DIAGNOSIS — Z79899 Other long term (current) drug therapy: Secondary | ICD-10-CM | POA: Diagnosis not present

## 2016-07-09 DIAGNOSIS — I5022 Chronic systolic (congestive) heart failure: Secondary | ICD-10-CM | POA: Diagnosis present

## 2016-07-09 DIAGNOSIS — D518 Other vitamin B12 deficiency anemias: Secondary | ICD-10-CM | POA: Diagnosis not present

## 2016-07-09 DIAGNOSIS — I252 Old myocardial infarction: Secondary | ICD-10-CM | POA: Diagnosis not present

## 2016-07-09 LAB — HEPARIN LEVEL (UNFRACTIONATED)
HEPARIN UNFRACTIONATED: 0.2 [IU]/mL — AB (ref 0.30–0.70)
Heparin Unfractionated: 0.47 IU/mL (ref 0.30–0.70)
Heparin Unfractionated: 0.46 IU/mL (ref 0.30–0.70)

## 2016-07-09 LAB — CBC
HCT: 40.8 % (ref 39.0–52.0)
HEMOGLOBIN: 14.2 g/dL (ref 13.0–17.0)
MCH: 32 pg (ref 26.0–34.0)
MCHC: 34.8 g/dL (ref 30.0–36.0)
MCV: 91.9 fL (ref 78.0–100.0)
PLATELETS: 218 10*3/uL (ref 150–400)
RBC: 4.44 MIL/uL (ref 4.22–5.81)
RDW: 16.8 % — ABNORMAL HIGH (ref 11.5–15.5)
WBC: 4.7 10*3/uL (ref 4.0–10.5)

## 2016-07-09 LAB — LIPID PANEL
CHOL/HDL RATIO: 5.6 ratio
CHOLESTEROL: 262 mg/dL — AB (ref 0–200)
HDL: 47 mg/dL (ref >40)
LDL Cholesterol: 202 mg/dL — ABNORMAL HIGH (ref 0–99)
Triglycerides: 66 mg/dL (ref <150)
VLDL: 13 mg/dL (ref 0–40)

## 2016-07-09 LAB — RAPID URINE DRUG SCREEN, HOSP PERFORMED
AMPHETAMINES: NOT DETECTED
BENZODIAZEPINES: POSITIVE — AB
Barbiturates: NOT DETECTED
COCAINE: NOT DETECTED
Opiates: NOT DETECTED
Tetrahydrocannabinol: POSITIVE — AB

## 2016-07-09 LAB — VITAMIN B12: Vitamin B-12: 290 pg/mL (ref 180–914)

## 2016-07-09 LAB — HIV ANTIBODY (ROUTINE TESTING W REFLEX): HIV SCREEN 4TH GENERATION: NONREACTIVE

## 2016-07-09 LAB — TSH: TSH: 0.79 u[IU]/mL (ref 0.350–4.500)

## 2016-07-09 MED ORDER — MORPHINE SULFATE (PF) 2 MG/ML IV SOLN: 2.0000 mg | INTRAVENOUS | Status: DC | PRN

## 2016-07-09 NOTE — Progress Notes (Signed)
ANTICOAGULATION CONSULT NOTE  Pharmacy Consult for heparin Indication: chest pain/ACS  Allergies  Allergen Reactions  . Iodine Anaphylaxis and Swelling  . Shellfish Allergy Anaphylaxis    All shellfish  . Contrast Media [Iodinated Diagnostic Agents] Nausea And Vomiting    Patient Measurements: Height: 5\' 11"  (180.3 cm) Weight: 111 lb 9.6 oz (50.6 kg) IBW/kg (Calculated) : 75.3 Heparin Dosing Weight: 52kg  Vital Signs: Temp: 98.7 F (37.1 C) (06/29 1157) Temp Source: Oral (06/29 1157) BP: 111/69 (06/29 1157) Pulse Rate: 63 (06/29 1157)  Labs:  Recent Labs  07/08/16 1751 07/08/16 2259 07/09/16 0036 07/09/16 0555 07/09/16 1510  HGB 14.7  --  14.2  --   --   HCT 42.3  --  40.8  --   --   PLT 225  --  218  --   --   HEPARINUNFRC  --   --  0.47 0.20* 0.46  CREATININE 1.14  --   --   --   --   TROPONINI  --  <0.03  --   --   --     Estimated Creatinine Clearance: 62.9 mL/min (by C-G formula based on SCr of 1.14 mg/dL).   Assessment: [name] with significant cardiac history now on heparin for chest pain. Heparin level 0.4, now therapeutic on 850 units/hr. No interruption with infusion, no bleeding noted per RN.   Goal of Therapy:  Heparin level 0.3-0.7 units/ml Monitor platelets by anticoagulation protocol: Yes   Plan:  Continue heparin rate at 850 units/hr Daily heparin level and CBC Follow cardiology plan  Sheppard CoilFrank Wilson PharmD., BCPS Clinical Pharmacist Pager [phone] 07/09/2016 4:14 PM

## 2016-07-09 NOTE — Progress Notes (Signed)
Paged MD for Orders for Patients to eat.

## 2016-07-09 NOTE — Progress Notes (Signed)
Pt is scheduled for [facility] on Monday, 07/12/16, at 1330 with Dr. SwazilandJordan. [facility].    Roe Rutherfordngela Nicole La Dibella, PA-C 07/09/2016, 2:44 PM [phone] [facility]

## 2016-07-09 NOTE — Consult Note (Signed)
Cardiology Consult    Patient ID: [name] MRN: [id number], DOB/AGE: 06/11/1975   Admit date: 07/08/2016 Date of Consult: 07/09/2016  Primary Physician: Massie Maroon, FNP Primary Cardiologist: Dr. Tresa Endo  Requesting Provider: Dr. Selena Batten  Reason for Consult: chest pain  Patient Profile    [name] has a PMH significant for HTN, HLD, GERD, depression, anxiety, bipolar disorder, hx of homelessness, tobacco abuse, marijuana and cocaine abuse, CAD s/p inferior STEMI with BMS to RCA (01/2007), NSTEMI with PTCA to RCA (07/2013), inferior STEMI PTCA/DES to [facility] and PTCA/DES to dRCA (01/2014), inferior STEMI with no intervention (05/2015), inferior STEMI with PTCA to dRCA (09/2015), brief run of NSVT during 05/2015 admission felt to be due to MI, chronic systolic heart failure, and medication noncompliance. He presented to [facility] with c/o 2-history of chest pain.   [name] is a 41 y.o. male who is being seen today for the evaluation of chest pain at the request of Dr. Selena Batten.   Past Medical History   Past Medical History:  Diagnosis Date  . Anxiety   . Bipolar 1 disorder (HCC)   . CAD S/P percutaneous coronary angioplasty    a. Inf STEMI s/p BMS to RCA 01/2007. b. NSTEMI 07/2013 s/p PTCA to RCA for ISR; c. Transient inferior ST elevation (peak Ti 0.25) 01/2014 s/p PTCA/DES to pRCA, PTCA/DES to dRCA, EF 50%; d. 12/2014 Inf STEMI: RCA patent prox stent, 171m/d (3.0x32 Synergy DES), EF 35-45; e. 05/2015 Inf STEMI/Cath: LM nl, LAD 10ost/m, D1 75, LCX nl, OM1 25, OM2 30, RCA patent stents, RPDA 30ost.  . Hx of medication noncompliance   . Hyperlipidemia   . Hypertensive heart disease   . Ischemic cardiomyopathy    a. EF 40% in 2011, 60% in 2012. b. EF 55% by cath 07/2013. c. EF 50% by cath 01/2014; d. 12/2014 EF 35-45% by LV gram; e. 05/2015 Echo: EF 50-55% inflat/inf AK, mild AI; f. 06/2015 cMRI: EF 49%, basal & mid inf full thickness scar, subendocardial scar in antsept and antlat wall,  correlating w/ D1 dzs.  Marland Kitchen NSVT (nonsustained ventricular tachycardia) (HCC)    a. Very brief run during 07/2013 admit for NSTEMI felt due to MI.  . Polysubstance abuse    a. h/o tobacco, marijuana and crack cocaine use. b. 07/2013: +UDS THC, neg for cocaine.  . Tobacco abuse     Past Surgical History:  Procedure Laterality Date  . CARDIAC CATHETERIZATION  01/16/2009   normal left main, Cfx with 2-OMs both w/minor irregularities, LAd with 20-30% mid region irregularities, ramus intermediate/optional diagonal with 60% osital narrowing, RCA with stent in distal portion w/20% prox in-stent stenosis (Dr. Mervyn Skeeters. [place])  . CARDIAC CATHETERIZATION  07/23/2013   two vessel obstructive CAD, occluded first diagonal, focal in-stent restenosis in distal RCA (Dr. Peter Swaziland)  . CARDIAC CATHETERIZATION N/A 01/10/2015   Procedure: Left Heart Cath and Coronary Angiography;  Surgeon: Marykay Lex, MD;  Location: [facility] INVASIVE CV LAB;  Service: Cardiovascular;  Laterality: N/A;  . CARDIAC CATHETERIZATION N/A 01/10/2015   Procedure: Coronary Stent Intervention;  Surgeon: Marykay Lex, MD;  Location: [facility] INVASIVE CV LAB;  Service: Cardiovascular;  Laterality: N/A;  . CARDIAC CATHETERIZATION N/A 06/10/2015   Procedure: Left Heart Cath and Coronary Angiography;  Surgeon: Peter M Swaziland, MD;  Location: [facility] INVASIVE CV LAB;  Service: Cardiovascular;  Laterality: N/A;  . CARDIAC CATHETERIZATION N/A 09/27/2015   Procedure: Left Heart Cath and Coronary Angiography;  Surgeon: Donnie Coffin  Eldridge Dace, MD;  Location: [facility] INVASIVE CV LAB;  Service: Cardiovascular;  Laterality: N/A;  . CARDIAC CATHETERIZATION N/A 09/27/2015   Procedure: Coronary Balloon Angioplasty;  Surgeon: Corky Crafts, MD;  Location: [facility] INVASIVE CV LAB;  Service: Cardiovascular;  Laterality: N/A;  . CORONARY ANGIOPLASTY WITH STENT PLACEMENT  02/05/2007   3.5x74mm Quantum non-DES to RCA (Dr. Nicki Guadalajara)  . FEMORAL ARTERY STENT    . LEFT AND RIGHT HEART  CATHETERIZATION WITH CORONARY ANGIOGRAM N/A 01/14/2014   Procedure: LEFT AND RIGHT HEART CATHETERIZATION WITH CORONARY ANGIOGRAM;  Surgeon: Kathleene Hazel, MD;  Location: [facility] CATH LAB;  Service: Cardiovascular;  Laterality: N/A;  . LEFT HEART CATHETERIZATION WITH CORONARY ANGIOGRAM N/A 07/23/2013   Procedure: LEFT HEART CATHETERIZATION WITH CORONARY ANGIOGRAM;  Surgeon: Peter M Swaziland, MD;  Location: [facility] CATH LAB;  Service: Cardiovascular;  Laterality: N/A;     Allergies  Allergies  Allergen Reactions  . Iodine Anaphylaxis and Swelling  . Shellfish Allergy Anaphylaxis    All shellfish  . Contrast Media [Iodinated Diagnostic Agents] Nausea And Vomiting    History of Present Illness    [name] is well-known to our service and last saw Justin Demark PA-C in clinic on 10/07/15. At that time, he had been recently discharged following an inferior STEMI s/p PTCA with thrombectomy to RCA (09/2015). At that time, it was noted that he was not compliant on the majority of his medications, he was only taking brilinta. He was encouraged to start taking ASA, lipitor, lisinopril, lopressor, and SL nitro. He has not followed up with our office since that visit.  On 07/08/16, he presented to [facility] with a 2-history of chest pain. He states that the pain waxed and waned on 07/07/16, occurred spontaneously with rest and was relieved without intervention. On 07/08/16, he got into an argument with his girlfriend at approximately 0430 and his chest pain re-occurred but was more severe. It was located substernal in his central chest and was associated with SOB, diaphoresis, nausea, emesis x 3, and left lung numbness. The pain was rated as a 7/10 and was similar to his previous symptoms associated with previous MIs. The pain was persistent that day and he reported to the fire department, who advised him to go to the ED. EMS gave SL nitro x 1 with relief of his pain to a 2/10. He was admitted and started on heparin  drip. His troponin x 2 have been negative and he is currently chest pain free. EKG unchanged from previous.  He has been out of his medications for 2 weeks because he didn't have refills and needed to be paid. He is only taking ASA currently.  Inpatient Medications    . ARIPiprazole  5 mg Oral BID  . aspirin EC  81 mg Oral Daily  . atorvastatin  80 mg Oral q1800  . dicyclomine  20 mg Oral BID  . famotidine  20 mg Oral BID  . lisinopril  5 mg Oral Daily  . metoprolol succinate  25 mg Oral Daily  . nicotine  21 mg Transdermal Daily  . ticagrelor  90 mg Oral BID     Outpatient Medications    Prior to Admission medications   Medication Sig Start Date End Date Taking? Authorizing Provider  ARIPiprazole (ABILIFY) 5 MG tablet Take 1 tablet (5 mg total) by mouth 2 (two) times daily. 06/09/16  Yes Rise Mu, PA-C  aspirin 81 MG EC tablet Take 1 tablet (81 mg total) by mouth  daily. 03/15/16  Yes Charm RingsLord, Jamison Y, NP  atorvastatin (LIPITOR) 80 MG tablet Take 1 tablet (80 mg total) by mouth daily at 6 PM. 03/15/16  Yes Lord, Herminio HeadsJamison Y, NP  dicyclomine (BENTYL) 20 MG tablet Take 1 tablet (20 mg total) by mouth 2 (two) times daily. 06/09/16  Yes Rise MuLeaphart, Kenneth T, PA-C  famotidine (PEPCID) 20 MG tablet Take 1 tablet (20 mg total) by mouth 2 (two) times daily. 03/15/16  Yes Charm RingsLord, Jamison Y, NP  lisinopril (PRINIVIL,ZESTRIL) 5 MG tablet Take 1 tablet (5 mg total) by mouth daily. 03/15/16  Yes Lord, Herminio HeadsJamison Y, NP  nitroGLYCERIN (NITROSTAT) 0.4 MG SL tablet Place 1 tablet (0.4 mg total) under the tongue every 5 (five) minutes x 3 doses as needed for chest pain. 03/15/16  Yes Charm RingsLord, Jamison Y, NP  ticagrelor (BRILINTA) 90 MG TABS tablet Take 1 tablet (90 mg total) by mouth 2 (two) times daily. 03/15/16  Yes Charm RingsLord, Jamison Y, NP  traZODone (DESYREL) 100 MG tablet Take 1 tablet (100 mg total) by mouth at bedtime as needed for sleep. 03/15/16  Yes Charm RingsLord, Jamison Y, NP  loperamide (IMODIUM) 2 MG capsule Take 1  capsule (2 mg total) by mouth 4 (four) times daily as needed for diarrhea or loose stools. Patient not taking: Reported on 03/13/2016 11/08/15   de Villier, Daryl F II, PA  metoprolol succinate (TOPROL-XL) 25 MG 24 hr tablet Take 1 tablet (25 mg total) by mouth daily. 03/15/16   Charm RingsLord, Jamison Y, NP  Multiple Vitamin (MULTIVITAMIN WITH MINERALS) TABS tablet Take 1 tablet by mouth daily. Patient not taking: Reported on 03/13/2016 10/01/15   Leone BrandIngold, Laura R, NP  naproxen (NAPROSYN) 500 MG tablet Take 1 tablet (500 mg total) by mouth 2 (two) times daily. Patient not taking: Reported on 06/09/2016 03/23/16   Horton, Mayer Maskerourtney F, MD  promethazine (PHENERGAN) 25 MG tablet Take 1 tablet (25 mg total) by mouth every 6 (six) hours as needed for nausea or vomiting. Patient not taking: Reported on 07/08/2016 06/09/16   Rise MuLeaphart, Kenneth T, PA-C     Family History    Family History  Problem Relation Age of Onset  . CAD Mother   . Heart disease Mother   . Heart attack Mother   . Schizophrenia Mother   . CAD Sister     Social History    Social History   Social History  . Marital status: Single    Spouse name: N/A  . Number of children: N/A  . Years of education: N/A   Occupational History  . Not on file.   Social History Main Topics  . Smoking status: Current Every Day Smoker    Packs/day: 0.25    Years: 30.00    Types: Cigarettes  . Smokeless tobacco: Never Used  . Alcohol use 0.0 oz/week     Comment: 80 oz of beer weekly  . Drug use: Yes    Types: Marijuana     Comment: Marijuana (last used on 01/07/2015)  . Sexual activity: Not Currently   Other Topics Concern  . Not on file   Social History Narrative  . No narrative on file     Review of Systems    General:  No chills, fever, night sweats or weight changes.  Cardiovascular:  No chest pain, dyspnea on exertion, edema, orthopnea, palpitations, paroxysmal nocturnal dyspnea. Dermatological: No rash, lesions/masses Respiratory: No  cough, dyspnea Urologic: No hematuria, dysuria Abdominal:   No nausea, vomiting, diarrhea, bright red blood  per rectum, melena, or hematemesis Neurologic:  No visual changes, wkns, changes in mental status. All other systems reviewed and are otherwise negative except as noted above.  Physical Exam    Blood pressure 112/77, pulse (!) 56, temperature 98.7 F (37.1 C), temperature source Oral, resp. rate 16, height 5\' 11"  (1.803 m), weight 111 lb 9.6 oz (50.6 kg), SpO2 98 %.  General: Pleasant, NAD Psych: Normal affect. Neuro: Alert and oriented X 3. Moves all extremities spontaneously. HEENT: Normal  Neck: Supple without bruits, no JVD. Lungs:  Resp regular and unlabored, CTA. Heart: RRR no s3, s4, or murmurs. Abdomen: Soft, non-tender, non-distended, BS + x 4.  Extremities: No clubbing, cyanosis or edema. DP/PT/Radials 2+ and equal bilaterally.  Labs    Troponin [facility] Test)  Recent Labs  07/08/16 1805  TROPIPOC 0.00    Recent Labs  07/08/16 2259  TROPONINI <0.03   Lab Results  Component Value Date   WBC 4.7 07/09/2016   HGB 14.2 07/09/2016   HCT 40.8 07/09/2016   MCV 91.9 07/09/2016   PLT 218 07/09/2016    Recent Labs Lab 07/08/16 1751 07/08/16 2015  NA 134*  --   K 5.9* 3.7  CL 102  --   CO2 20*  --   BUN 6  --   CREATININE 1.14  --   CALCIUM 9.4  --   GLUCOSE 101*  --    Lab Results  Component Value Date   CHOL 262 (H) 07/09/2016   HDL 47 07/09/2016   LDLCALC 202 (H) 07/09/2016   TRIG 66 07/09/2016   Lab Results  Component Value Date   DDIMER  01/15/2009    0.22        AT THE INHOUSE ESTABLISHED CUTOFF VALUE OF 0.48 ug/mL FEU, THIS ASSAY HAS BEEN DOCUMENTED IN THE LITERATURE TO HAVE A SENSITIVITY AND NEGATIVE PREDICTIVE VALUE OF AT LEAST 98 TO 99%.  THE TEST RESULT SHOULD BE CORRELATED WITH AN ASSESSMENT OF THE CLINICAL PROBABILITY OF DVT / VTE.     Radiology Studies    Ct Abdomen Pelvis Wo Contrast  Result Date:  06/09/2016 CLINICAL DATA:  Acute onset of nausea and vomiting. Initial encounter. EXAM: CT ABDOMEN AND PELVIS WITHOUT CONTRAST TECHNIQUE: Multidetector CT imaging of the abdomen and pelvis was performed following the standard protocol without IV contrast. COMPARISON:  CT of the abdomen and pelvis from 11/08/2015 FINDINGS: Lower chest: The visualized lung bases are grossly clear. Scattered coronary artery calcifications are seen. Hepatobiliary: The liver is unremarkable in appearance. The gallbladder is unremarkable in appearance. The common bile duct remains normal in caliber. Pancreas: The pancreas is within normal limits. Spleen: The spleen is unremarkable in appearance. Adrenals/Urinary Tract: The adrenal glands are unremarkable in appearance. The kidneys are within normal limits. There is no evidence of hydronephrosis. No renal or ureteral stones are identified. No perinephric stranding is seen. Stomach/Bowel: The stomach is unremarkable in appearance. The small bowel is within normal limits. The appendix is normal in caliber, without evidence of appendicitis. The colon is unremarkable in appearance. Vascular/Lymphatic: The abdominal aorta is unremarkable in appearance. Mild calcification is seen along the common iliac arteries bilaterally. The inferior vena cava is grossly unremarkable. No retroperitoneal lymphadenopathy is seen. No pelvic sidewall lymphadenopathy is identified. Reproductive: The bladder is mildly distended and grossly unremarkable. The prostate remains normal in size. Other: No additional soft tissue abnormalities are seen. Musculoskeletal: No acute osseous abnormalities are identified. The visualized musculature is unremarkable in appearance.  IMPRESSION: 1. No acute abnormality seen within the abdomen or pelvis. 2. Scattered coronary artery calcifications seen. Electronically Signed   By: Roanna Raider M.D.   On: 06/09/2016 23:04   Dg Chest 2 View  Result Date: 07/08/2016 CLINICAL DATA:   Chest pain EXAM: CHEST  2 VIEW COMPARISON:  11/19/2015 FINDINGS: Hyperinflation. No focal pulmonary infiltrate, consolidation, or pleural effusion. Normal cardiomediastinal silhouette. No pneumothorax. IMPRESSION: No active cardiopulmonary disease. Electronically Signed   By: Jasmine Pang M.D.   On: 07/08/2016 18:25   [name] Contrast  Result Date: 07/09/2016 CLINICAL DATA:  41 y/o  M; left leg numbness. EXAM: MRI HEAD WITHOUT CONTRAST TECHNIQUE: Multiplanar, multiecho pulse sequences of the brain and surrounding structures were obtained without intravenous contrast. COMPARISON:  None. FINDINGS: Brain: No acute infarction, hemorrhage, hydrocephalus, extra-axial collection or mass lesion. Vascular: Normal flow voids. Skull and upper cervical spine: Normal marrow signal. Sinuses/Orbits: Negative. Other: None. IMPRESSION: Normal MRI of the brain. Electronically Signed   By: Mitzi Hansen M.D.   On: 07/09/2016 02:31    ECG & Cardiac Imaging    EKG 07/09/16: sinus rhythm, inferior Q waves, LVH  Echocardiogram 09/27/15: Study Conclusions - Left ventricle: The cavity size was normal. Wall thickness was   normal. Systolic function was mildly to moderately reduced. The   estimated ejection fraction was in the range of 40% to 45%.   Severe hypokinesis and scarring of the basal-midinferolateral,   inferior, and inferoseptal myocardium; consistent with infarction   in the distribution of the right coronary artery. Left   ventricular diastolic function parameters were normal. - Aortic valve: There was trivial regurgitation.  Left heart catheterization 09/27/15:  Prox RCA stent is patent.  Ost 1st Diag to 1st Diag lesion, 75 %stenosed.  Nononstructive disease in the LAD and circumflex.  Mid RCA to Dist RCA lesion, 100 %stenosed. Post intervention with aspiration thrombectomy and PTCA with a 3.5 [place] balloon, there is a 0% residual stenosis.  LV end diastolic pressure is normal.  There  is no aortic valve stenosis.  He had right radial artery spasm treated with IA NTG.   Restart DAPT.  Will have to find a way for him to have Brilinta or Plavix long term.  Would like to keep him on Plavix long term given this episode.  IVUS not performed due to small distal vessel, beyond the stented segment.  Continue Angiomax  and IV tirofiban for a few hours.  Check echocardiogram.  Will also need assistance with his psychiatric meds.  Diagnostic Diagram       Post-Intervention Diagram           Assessment & Plan    1. Chest pain, s/p multiple PCIs, medication non-compliance - troponin x 2 negative - EKG with inferior Q waves Pt has been out of medication for 2 weeks and only taking ASA. His last stent was placed 01/10/15, so he is out of the window for DAPT. However, given his stent restenosis, he was recommended for long-term DAPT. This is not the first time he has been off all medications except ASA.  EPIC review shows that his troponins were not elevated during his 2016 and 2017 events. Given his symptoms of unstable angina and associated symptoms that are similar to previous episodes, will discuss with attending medical management vs repeat heart catheterization.  2. Chronic systolic heart failure - 2017 echo with LVEF of 40-45% - primary team started toprol; however, beta blockers should be avoided given his  cocaine history and poor medication compliance and office follow up; UDS positive for benzos and marijuana only  3. HTN - pressure has been well-controlled on home lisinopril  4. HLD - restarted home lipitor  5. Medication noncompliance - he stated the reason was he ran out of refills. Please discharge with 30-day supply and we will follow in clinic.   Signed, Marcelino Duster, PA-C 07/09/2016, 8:56 AM [phone]   History and all data above reviewed.  Patient examined.  I agree with the findings as above.  Patient with chest pain similar to  previous angina worse than usual.  Enzymes and EKG have not been acute.  However, in the past he has had an occluded RCA without enzyme elevation.  This presentation is similar.   The patient exam reveals COR:RRR  ,  Lungs: Clear  ,  Abd: Positive bowel sounds, no rebound no guarding, Ext No edema  .  All available labs, radiology testing, previous records reviewed. Agree with documented assessment and plan. CHEST PAIN:  Consistent with unstable angina.  Needs cardiac cath.  The patient understands that risks included but are not limited to stroke (1 in 1000), death (1 in 1000), kidney failure [usually temporary] (1 in 500), bleeding (1 in 200), allergic reaction [possibly serious] (1 in 200).  The patient understands and agrees to proceed.   He is on the board for cath Monday.   [name]  2:42 PM  07/09/2016

## 2016-07-09 NOTE — Progress Notes (Signed)
Patient ID: [name], male   DOB: 04/21/1975, 41 y.o.   MRN: [id number]                                                                PROGRESS NOTE                                                                                                                                                                                                             Patient Demographics:    [name], is a 41 y.o. male, DOB - [birth year], WUJ:[id number]  Admit date - 07/08/2016   Admitting Physician Lorretta Harp, MD  Outpatient Primary MD for the patient is Massie Maroon, FNP  LOS - 0  Outpatient Specialists:   ? Nicki [place]  Chief Complaint  Patient presents with  . Chest Pain  . Emesis       Brief Narrative      41 y.o. male with medical history significant of hypertension, hyperlipidemia, GERD, depression, anxiety, tobacco abuse, marijuana abuse, NSVT, CAD, STEMI, s/p of multiple stent placement, bipolar disorder, sCHF, medication noncompliance, who presents with chest pain, left leg numbness and nausea vomiting.  Patient states that he has has not taken his home medications in the past 2 weeks. He develops chest pain which has been going on for 2 days. The chest is located in the substernal area, intermittent, 7 out of 10 in safety, pressure-like pain, nonradiating. It happens 3-4 times each day. It is associated with SOB. He has dry cough. No tenderness in the calf area. He also reports left leg numbness which has been going on for 3 weeks. No unilateral weakness, vision change, hearing loss, slurred speech or facial droop. Patient states that he has nausea and vomited 3 times today. No diarrhea. He has left lower quadrant abdominal pain earlier, which has resolved. Denies symptoms of UTI.  ED Course: pt was found to have negative troponin, BNP 23.1, lipase 25, WBC 4.6, creatinine 1.14, temperature normal, bradycardia, O2 sat 91-97% on room air, negative chest x-ray for acute  abnormalities. Patient is placed on telemetry bed for observation.   Subjective:    [name] today states no further chest pain presently.  Afebrile, slight dyspnea.   No headache,  No abdominal pain - No Nausea, No  new weakness tingling or numbness, No Cough    Assessment  & Plan :    Principal Problem:   Chest pain Active Problems:   Depression   Essential hypertension   HLD (hyperlipidemia)   Hx of medication noncompliance   Tobacco abuse   Nausea with vomiting   Tetrahydrocannabinol (THC) use disorder, severe, dependence (HCC)   Chronic systolic CHF (congestive heart failure) (HCC)   Left leg numbness   Chest pain: Patient's CP has been going on intermittently for 2 days, given his very significant history of CAD, 3 stent placement and 7  MI, very concerning for unstable angina. Patient is not taking any home medications.  cont IV heparin Trop I q6x3 prn Nitroglycerin, Morphine, and aspirin, lipitor, metoprolol, brillinta Risk factor stratification: will check FLP, UDS and A1C  2d echo pending Cardiology consulted, appreciate input  Chronic systolic CHF: 2-D echo on 09/27/15 showed EF of 40-45%. Patient does not have leg edema JVD. CHF is compensated. Patient is not taking diuretics at home. Bnp 23 (wnl) \  Hyperkalemia Resolved Check cmp in am  Depression: Stable, no suicidal or homicidal ideations. Continue home medications: Abilify  HTN: Cont Metoprolol, lisinopril, -IV hydralazine when necessary  HLD: Cont lipitor  Tobacco abuse and Tetrahydrocannabinol abuse Did counseling about importance of quitting smoking and marijuana Nicotine patch  Nausea with vomiting: Likely due to marijuana abuse. Lipase normal. No acute abdomen on physical examination. Patient had left lower quadrant abdominal pain, which has resolved currently. No diarrhea. When necessary Zofran for nausea.-  Left leg numbness: Etiology is not clear. No back pain MRI-brain  6/29 => negative  check vitamin B12 and TSH  DVT ppx: on IV Heparin   Code Status: Full code Family Communication: None at bed side.     Disposition Plan:  Anticipate discharge back to previous home environment Consults called:  none Admission status: Obs / tele       Lab Results  Component Value Date   PLT 218 07/09/2016      Anti-infectives    None        Objective:   Vitals:   07/08/16 2300 07/08/16 2330 07/08/16 2345 07/09/16 0033  BP: 121/80 134/80 110/72 128/90  Pulse: 64 (!) 51 (!) 48 62  Resp: 15 (!) 25 15 16   Temp:    98.5 F (36.9 C)  TempSrc:    Oral  SpO2: 91% 97% 93% 93%  Weight:    50.6 kg (111 lb 9.6 oz)  Height:    5\' 11"  (1.803 m)    Wt Readings from Last 3 Encounters:  07/09/16 50.6 kg (111 lb 9.6 oz)  03/23/16 59 kg (130 lb)  11/25/15 61.7 kg (136 lb)     Intake/Output Summary (Last 24 hours) at 07/09/16 0710 Last data filed at 07/09/16 0400  Gross per 24 hour  Intake           258.15 ml  Output                0 ml  Net           258.15 ml     Physical Exam  Awake Alert, Oriented X 3, No new F.N deficits, Normal affect [place].AT,PERRAL Supple Neck,No JVD, No cervical lymphadenopathy appriciated.  Symmetrical Chest wall movement, Good air movement bilaterally, CTAB RRR,No Gallops,Rubs or new Murmurs, No Parasternal Heave +ve B.Sounds, Abd Soft, No tenderness, No organomegaly appriciated, No rebound - guarding or rigidity. No Cyanosis, Clubbing or edema,  No new Rash or bruise      Data Review:    CBC  Recent Labs Lab 07/08/16 1751 07/09/16 0036  WBC 4.6 4.7  HGB 14.7 14.2  HCT 42.3 40.8  PLT 225 218  MCV 92.8 91.9  MCH 32.2 32.0  MCHC 34.8 34.8  RDW 17.2* 16.8*    Chemistries   Recent Labs Lab 07/08/16 1751 07/08/16 2015  NA 134*  --   K 5.9* 3.7  CL 102  --   CO2 20*  --   GLUCOSE 101*  --   BUN 6  --   CREATININE 1.14  --   CALCIUM 9.4  --     ------------------------------------------------------------------------------------------------------------------  Recent Labs  07/09/16 0036  CHOL 262*  HDL 47  LDLCALC 202*  TRIG 66  CHOLHDL 5.6    Lab Results  Component Value Date   HGBA1C 5.3 06/11/2015   ------------------------------------------------------------------------------------------------------------------ No results for input(s): TSH, T4TOTAL, T3FREE, THYROIDAB in the last 72 hours.  Invalid input(s): FREET3 ------------------------------------------------------------------------------------------------------------------ No results for input(s): VITAMINB12, FOLATE, FERRITIN, TIBC, IRON, RETICCTPCT in the last 72 hours.  Coagulation profile No results for input(s): INR, PROTIME in the last 168 hours.  No results for input(s): DDIMER in the last 72 hours.  Cardiac Enzymes  Recent Labs Lab 07/08/16 2259  TROPONINI <0.03   ------------------------------------------------------------------------------------------------------------------    Component Value Date/Time   BNP 23.1 07/08/2016 2300    Inpatient Medications  Scheduled Meds: . ARIPiprazole  5 mg Oral BID  . aspirin EC  81 mg Oral Daily  . atorvastatin  80 mg Oral q1800  . dicyclomine  20 mg Oral BID  . famotidine  20 mg Oral BID  . lisinopril  5 mg Oral Daily  . metoprolol succinate  25 mg Oral Daily  . nicotine  21 mg Transdermal Daily  . ticagrelor  90 mg Oral BID   Continuous Infusions: . sodium chloride 75 mL/hr at 07/09/16 0057  . heparin 700 Units/hr (07/08/16 2348)   PRN Meds:.acetaminophen, hydrALAZINE, morphine injection, nitroGLYCERIN, ondansetron (ZOFRAN) IV, traZODone, zolpidem  Micro Results No results found for this or any previous visit (from the past 240 hour(s)).  Radiology Reports Ct Abdomen Pelvis Wo Contrast  Result Date: 06/09/2016 CLINICAL DATA:  Acute onset of nausea and vomiting. Initial encounter.  EXAM: CT ABDOMEN AND PELVIS WITHOUT CONTRAST TECHNIQUE: Multidetector CT imaging of the abdomen and pelvis was performed following the standard protocol without IV contrast. COMPARISON:  CT of the abdomen and pelvis from 11/08/2015 FINDINGS: Lower chest: The visualized lung bases are grossly clear. Scattered coronary artery calcifications are seen. Hepatobiliary: The liver is unremarkable in appearance. The gallbladder is unremarkable in appearance. The common bile duct remains normal in caliber. Pancreas: The pancreas is within normal limits. Spleen: The spleen is unremarkable in appearance. Adrenals/Urinary Tract: The adrenal glands are unremarkable in appearance. The kidneys are within normal limits. There is no evidence of hydronephrosis. No renal or ureteral stones are identified. No perinephric stranding is seen. Stomach/Bowel: The stomach is unremarkable in appearance. The small bowel is within normal limits. The appendix is normal in caliber, without evidence of appendicitis. The colon is unremarkable in appearance. Vascular/Lymphatic: The abdominal aorta is unremarkable in appearance. Mild calcification is seen along the common iliac arteries bilaterally. The inferior vena cava is grossly unremarkable. No retroperitoneal lymphadenopathy is seen. No pelvic sidewall lymphadenopathy is identified. Reproductive: The bladder is mildly distended and grossly unremarkable. The prostate remains normal in size. Other: No additional soft  tissue abnormalities are seen. Musculoskeletal: No acute osseous abnormalities are identified. The visualized musculature is unremarkable in appearance. IMPRESSION: 1. No acute abnormality seen within the abdomen or pelvis. 2. Scattered coronary artery calcifications seen. Electronically Signed   By: Roanna RaiderJeffery  Chang M.D.   On: 06/09/2016 23:04   Dg Chest 2 View  Result Date: 07/08/2016 CLINICAL DATA:  Chest pain EXAM: CHEST  2 VIEW COMPARISON:  11/19/2015 FINDINGS: Hyperinflation.  No focal pulmonary infiltrate, consolidation, or pleural effusion. Normal cardiomediastinal silhouette. No pneumothorax. IMPRESSION: No active cardiopulmonary disease. Electronically Signed   By: Jasmine PangKim  Fujinaga M.D.   On: 07/08/2016 18:25   [name] Contrast  Result Date: 07/09/2016 CLINICAL DATA:  41 y/o  M; left leg numbness. EXAM: MRI HEAD WITHOUT CONTRAST TECHNIQUE: Multiplanar, multiecho pulse sequences of the brain and surrounding structures were obtained without intravenous contrast. COMPARISON:  None. FINDINGS: Brain: No acute infarction, hemorrhage, hydrocephalus, extra-axial collection or mass lesion. Vascular: Normal flow voids. Skull and upper cervical spine: Normal marrow signal. Sinuses/Orbits: Negative. Other: None. IMPRESSION: Normal MRI of the brain. Electronically Signed   By: Mitzi HansenLance  Furusawa-Stratton M.D.   On: 07/09/2016 02:31    Time Spent in minutes  30   Pearson GrippeJames Anye Brose M.D on 07/09/2016 at 7:10 AM  Between 7am to 7pm - Pager - [phone]  After 7pm go to [email or web address] - password [email or web address]  [facility] -  Office  [phone]

## 2016-07-09 NOTE — Progress Notes (Signed)
New pt admission from ED. Pt brought to the floor in stable condition. Vitals taken. Initial Assessment done. All immediate pertinent needs to patient addressed. Patient Guide given to patient. Important safety instructions relating to hospitalization reviewed with patient. Patient verbalized understanding. Will continue to monitor pt.  Pt left for MRI at 1.15pm, Inj. Ativan given before pt transferred to MRI, RN accompanied patient to MRI to unhook heparin.

## 2016-07-09 NOTE — Progress Notes (Signed)
Pt is alert and oriented with no chest pain., stated that he was dizzy upon standing. On Hep Drip Medication refills ran out with only receive 15 day supply will need scripts at discharge.

## 2016-07-09 NOTE — Progress Notes (Signed)
Pt is alert and oriented, vitals stable waiting for Stress test HR 50s Metoprolol held per MD orders.

## 2016-07-09 NOTE — Progress Notes (Signed)
ANTICOAGULATION CONSULT NOTE - Initial Consult  Pharmacy Consult for heparin Indication: chest pain/ACS  Allergies  Allergen Reactions  . Iodine Anaphylaxis and Swelling  . Shellfish Allergy Anaphylaxis    All shellfish  . Contrast Media [Iodinated Diagnostic Agents] Nausea And Vomiting    Patient Measurements: Height: 5\' 11"  (180.3 cm) Weight: 111 lb 9.6 oz (50.6 kg) IBW/kg (Calculated) : 75.3 Heparin Dosing Weight: 52kg  Vital Signs: Temp: 98.7 F (37.1 C) (06/29 0735) Temp Source: Oral (06/29 0735) BP: 112/77 (06/29 0735) Pulse Rate: 56 (06/29 0735)  Labs:  Recent Labs  07/08/16 1751 07/08/16 2259 07/09/16 0036 07/09/16 0555  HGB 14.7  --  14.2  --   HCT 42.3  --  40.8  --   PLT 225  --  218  --   HEPARINUNFRC  --   --  0.47 0.20*  CREATININE 1.14  --   --   --   TROPONINI  --  <0.03  --   --     Estimated Creatinine Clearance: 62.9 mL/min (by C-G formula based on SCr of 1.14 mg/dL).   Assessment: [age] YOM with significant cardiac history now on heparin for chest pain. Heparin level 0.2, subtherapeutic on 700 units/hr. No interruption with infusion, no bleeding noted per RN.  Likely for stress test today  Goal of Therapy:  Heparin level 0.3-0.7 units/ml Monitor platelets by anticoagulation protocol: Yes   Plan:  Increase heparin rate to 850 units/hr F/u 6 hr heparin level 1600 Daily heparin level and CBC Follow cardiology plan  Bayard HuggerMei Lynsi Dooner, PharmD, BCPS  Clinical Pharmacist  Pager: [phone]   07/09/2016 9:46 AM

## 2016-07-10 ENCOUNTER — Inpatient Hospital Stay (HOSPITAL_COMMUNITY): Payer: Medicaid Other

## 2016-07-10 DIAGNOSIS — R072 Precordial pain: Secondary | ICD-10-CM

## 2016-07-10 DIAGNOSIS — I200 Unstable angina: Secondary | ICD-10-CM | POA: Diagnosis present

## 2016-07-10 DIAGNOSIS — D518 Other vitamin B12 deficiency anemias: Secondary | ICD-10-CM

## 2016-07-10 LAB — CBC
HEMATOCRIT: 33.9 % — AB (ref 39.0–52.0)
HEMOGLOBIN: 11.5 g/dL — AB (ref 13.0–17.0)
MCH: 31.4 pg (ref 26.0–34.0)
MCHC: 33.9 g/dL (ref 30.0–36.0)
MCV: 92.6 fL (ref 78.0–100.0)
Platelets: 188 10*3/uL (ref 150–400)
RBC: 3.66 MIL/uL — ABNORMAL LOW (ref 4.22–5.81)
RDW: 16.8 % — AB (ref 11.5–15.5)
WBC: 3.9 10*3/uL — ABNORMAL LOW (ref 4.0–10.5)

## 2016-07-10 LAB — COMPREHENSIVE METABOLIC PANEL
ALK PHOS: 52 U/L (ref 38–126)
ALT: 14 U/L — ABNORMAL LOW (ref 17–63)
ANION GAP: 7 (ref 5–15)
AST: 14 U/L — ABNORMAL LOW (ref 15–41)
Albumin: 3 g/dL — ABNORMAL LOW (ref 3.5–5.0)
BILIRUBIN TOTAL: 1 mg/dL (ref 0.3–1.2)
BUN: 5 mg/dL — AB (ref 6–20)
CO2: 23 mmol/L (ref 22–32)
Calcium: 8.5 mg/dL — ABNORMAL LOW (ref 8.9–10.3)
Chloride: 107 mmol/L (ref 101–111)
Creatinine, Ser: 0.99 mg/dL (ref 0.61–1.24)
GFR calc Af Amer: >60 mL/min (ref >60)
GLUCOSE: 80 mg/dL (ref 65–99)
POTASSIUM: 3.4 mmol/L — AB (ref 3.5–5.1)
Sodium: 137 mmol/L (ref 135–145)
TOTAL PROTEIN: 5.1 g/dL — AB (ref 6.5–8.1)

## 2016-07-10 LAB — HEMOGLOBIN A1C
Hgb A1c MFr Bld: 5.1 % (ref 4.8–5.6)
Mean Plasma Glucose: 100 mg/dL

## 2016-07-10 LAB — ECHOCARDIOGRAM COMPLETE
Height: 71 in
WEIGHTICAEL: 1872 [oz_av]

## 2016-07-10 LAB — HEPARIN LEVEL (UNFRACTIONATED): Heparin Unfractionated: 0.46 IU/mL (ref 0.30–0.70)

## 2016-07-10 MED ORDER — POTASSIUM CHLORIDE CRYS ER 20 MEQ PO TBCR
40.0000 meq | EXTENDED_RELEASE_TABLET | Freq: Once | ORAL | Status: AC
  Administered 2016-07-10: 40 meq via ORAL
  Filled 2016-07-10: qty 2

## 2016-07-10 MED ORDER — VITAMIN B-12 1000 MCG PO TABS
1000.0000 ug | ORAL_TABLET | Freq: Every day | ORAL | Status: DC
  Administered 2016-07-10 – 2016-07-12 (×3): 1000 ug via ORAL
  Filled 2016-07-10 (×3): qty 1

## 2016-07-10 NOTE — Progress Notes (Signed)
  Echocardiogram 2D Echocardiogram has been performed.  Pieter PartridgeBrooke S Rockwell Zentz 07/10/2016, 12:11 PM

## 2016-07-10 NOTE — Progress Notes (Signed)
ANTICOAGULATION CONSULT NOTE  Pharmacy Consult for heparin Indication: chest pain/ACS  Allergies  Allergen Reactions  . Iodine Anaphylaxis and Swelling  . Shellfish Allergy Anaphylaxis    All shellfish  . Contrast Media [Iodinated Diagnostic Agents] Nausea And Vomiting    Patient Measurements: Height: 5\' 11"  (180.3 cm) Weight: 117 lb (53.1 kg) IBW/kg (Calculated) : 75.3 Heparin Dosing Weight: 52kg  Vital Signs: Temp: 98 F (36.7 C) (06/30 0418) Temp Source: Oral (06/30 0418) BP: 120/71 (06/30 0920) Pulse Rate: 70 (06/30 0920)  Labs:  Recent Labs  07/08/16 1751 07/08/16 2259  07/09/16 0036 07/09/16 0555 07/09/16 1510 07/10/16 0247  HGB 14.7  --   --  14.2  --   --  11.5*  HCT 42.3  --   --  40.8  --   --  33.9*  PLT 225  --   --  218  --   --  188  HEPARINUNFRC  --   --   < > 0.47 0.20* 0.46 0.46  CREATININE 1.14  --   --   --   --   --  0.99  TROPONINI  --  <0.03  --   --   --   --   --   < > = values in this interval not displayed.  Estimated Creatinine Clearance: 76 mL/min (by C-G formula based on SCr of 0.99 mg/dL).   Assessment: [age] YOM with significant cardiac history now on heparin for chest pain.   Heparin level now therapeutic x 2 on 850 units/hr. Hgb has trended down but no reported s/s bleeding.   Goal of Therapy:  Heparin level 0.3-0.7 units/ml Monitor platelets by anticoagulation protocol: Yes   Plan:  Continue heparin rate at 850 units/hr Daily heparin level and CBC [facility]  Hania Cerone K. Bonnye FavaNicolsen, PharmD, BCPS, CPP Clinical Pharmacist Pager: [phone] Phone: [phone] 07/10/2016 12:06 PM

## 2016-07-10 NOTE — Progress Notes (Signed)
PROGRESS NOTE                                                                                                                                                                                                             Patient Demographics:    [name], is a 41 y.o. male, DOB - 07/24/1975, ZOX:[id number]  Admit date - 07/08/2016   Admitting Physician Lorretta Harp, MD  Outpatient Primary MD for the patient is Massie Maroon, FNP  LOS - 1  Outpatient Specialists:  Chief Complaint  Patient presents with  . Chest Pain  . 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 He stopped taking his home medication in the past 2 weeks and developed substernal chest pain for past 2 days. Also reported left thigh numbness for past 3 weeks. In the ED labs were stable with normal chest x-ray, stable EKG and negative troponin. Patient admitted for unstable angina and started on IV heparin. Cardiology consulted .  Subjective:   Denies further chest pain or shortness of breath. Still has off-and-on left thigh numbness.   Assessment  & Plan :    Principal Problem: Unstable angina Continue IV heparin. Resume aspirin, statin. Continue Toprol (urine drug screen negative for cocaine), continue Brilinta. Sublingual nitroglycerin when necessary. Plan on cardiac cath on Monday (7/2).  Active Problems: Chronic systolic CHF EF of 40-45% per last echo. Euvolemic. Continue aspirin, lisinopril, beta blocker and statin.   Left thigh numbness Ongoing for past 3 weeks, off and on. MRI brain unremarkable. TSH normal, negative HIV antibody . B12 low  normal (290), added supplement.  Tobacco and marijuana use Counseled strongly on cessation. Adnexa patch.  Hypokalemia Replenish     Chronic Depression  Continue Abilify and trazodone  Protein calorie malnutrition Dietitian consulted        Code Status : Full code   Family Communication  : girlfriend At bedside   Disposition Plan  : Home once workup completed   Barriers For Discharge : Active symptoms, pending cardiac cath on Monday   Procedures  : None  DVT Prophylaxis  :IV heparin   Lab Results  Component Value Date   PLT 188 07/10/2016    Antibiotics  :    Anti-infectives    None        Objective:   Vitals:   07/09/16 1157 07/09/16 2037 07/10/16 0418 07/10/16 0920  BP: 111/69 111/69 121/90 120/71  Pulse: 63 67  70  Resp: 18 18    Temp: 98.7 F (37.1 C) 98.1 F (36.7 C) 98 F (36.7 C)   TempSrc: Oral Oral Oral   SpO2: 99% 98% 98%   Weight:   53.1 kg (117 lb)   Height:        Wt Readings from Last 3 Encounters:  07/10/16 53.1 kg (117 lb)  03/23/16 59 kg (130 lb)  11/25/15 61.7 kg (136 lb)     Intake/Output Summary (Last 24 hours) at 07/10/16 1021 Last data filed at 07/10/16 0900  Gross per 24 hour  Intake              120 ml  Output              200 ml  Net              -80 ml     Physical Exam  Gen: not in distress HEENT: Temporal wasting, moist mucosa, supple neck Chest: clear b/l, no added sounds CVS: N S1&S2, no murmurs, rubs or gallop GI: soft, NT, ND,  Musculoskeletal: warm, no edema CNSAlert and oriented, normal strength in lower extremity, numbness in left thigh    Data Review:    CBC  Recent Labs Lab 07/08/16 1751 07/09/16 0036 07/10/16 0247  WBC 4.6 4.7 3.9*  HGB 14.7 14.2 11.5*  HCT 42.3 40.8 33.9*  PLT 225 218 188  MCV 92.8 91.9 92.6  MCH 32.2 32.0 31.4  MCHC 34.8 34.8 33.9  RDW 17.2* 16.8* 16.8*    Chemistries   Recent Labs Lab 07/08/16 1751 07/08/16 2015 07/10/16 0247  NA 134*  --  137   K 5.9* 3.7 3.4*  CL 102  --  107  CO2 20*  --  23  GLUCOSE 101*  --  80  BUN 6  --  5*  CREATININE 1.14  --  0.99  CALCIUM 9.4  --  8.5*  AST  --   --  14*  ALT  --   --  14*  ALKPHOS  --   --  52  BILITOT  --   --  1.0   ------------------------------------------------------------------------------------------------------------------  Recent Labs  07/09/16 0036  CHOL 262*  HDL 47  LDLCALC 202*  TRIG 66  CHOLHDL 5.6    Lab Results  Component Value Date   HGBA1C 5.1 07/09/2016   ------------------------------------------------------------------------------------------------------------------  Recent Labs  07/09/16 0555  TSH 0.790   ------------------------------------------------------------------------------------------------------------------  Recent Labs  07/09/16 0555  VITAMINB12 290    Coagulation profile No results for input(s): INR, PROTIME in the last 168 hours.  No results for input(s): DDIMER in the last 72 hours.  Cardiac Enzymes  Recent Labs Lab 07/08/16 2259  TROPONINI <0.03   ------------------------------------------------------------------------------------------------------------------    Component Value Date/Time   BNP 23.1 07/08/2016 2300    Inpatient Medications  Scheduled Meds: . ARIPiprazole  5 mg Oral BID  . aspirin EC  81 mg Oral Daily  . atorvastatin  80 mg Oral q1800  . dicyclomine  20 mg Oral BID  . famotidine  20 mg Oral BID  . lisinopril  5 mg Oral Daily  . metoprolol succinate  25 mg Oral Daily  . nicotine  21 mg Transdermal Daily  . ticagrelor  90 mg Oral BID  . vitamin B-12  1,000 mcg Oral Daily   Continuous Infusions: . heparin 850 Units/hr (07/09/16 0937)   PRN Meds:.acetaminophen, hydrALAZINE, morphine injection, nitroGLYCERIN, ondansetron (ZOFRAN) IV, traZODone, zolpidem  Micro Results No results found for this or any previous visit (from the past 240 hour(s)).  Radiology Reports Dg Chest 2  View  Result Date: 07/08/2016 CLINICAL DATA:  Chest pain EXAM: CHEST  2 VIEW COMPARISON:  11/19/2015 FINDINGS: Hyperinflation. No focal pulmonary infiltrate, consolidation, or pleural effusion. Normal cardiomediastinal silhouette. No pneumothorax. IMPRESSION: No active cardiopulmonary disease. Electronically Signed   By: Jasmine Pang M.D.   On: 07/08/2016 18:25   [name] Contrast  Result Date: 07/09/2016 CLINICAL DATA:  41 y/o  M; left leg numbness. EXAM: MRI HEAD WITHOUT CONTRAST TECHNIQUE: Multiplanar, multiecho pulse sequences of the brain and surrounding structures were obtained without intravenous contrast. COMPARISON:  None. FINDINGS: Brain: No acute infarction, hemorrhage, hydrocephalus, extra-axial collection or mass lesion. Vascular: Normal flow voids. Skull and upper cervical spine: Normal marrow signal. Sinuses/Orbits: Negative. Other: None. IMPRESSION: Normal MRI of the brain. Electronically Signed   By: Mitzi Hansen M.D.   On: 07/09/2016 02:31    Time Spent in minutes  35   Eddie North M.D on 07/10/2016 at 10:21 AM  Between 7am to 7pm - Pager - [phone]  After 7pm go to [email or web address] - password [email or web address]  [facility] -  Office  [phone]

## 2016-07-10 NOTE — Progress Notes (Signed)
Pt slept well during the night, Vitals stable, no any sign of SOB and distress noted, no any complain of pain, IV fluid is continue, will continue to monitor the patient.

## 2016-07-10 NOTE — Progress Notes (Signed)
Patient refused bed alarm. Will continue to monitor patient. 

## 2016-07-10 NOTE — Progress Notes (Signed)
Progress Note  Patient Name: [name] Date of Encounter: 07/10/2016  Primary Cardiologist: Dr. Nicki Guadalajara  Subjective   No chest pain or shortness of breath at rest.  Inpatient Medications    Scheduled Meds: . ARIPiprazole  5 mg Oral BID  . aspirin EC  81 mg Oral Daily  . atorvastatin  80 mg Oral q1800  . dicyclomine  20 mg Oral BID  . famotidine  20 mg Oral BID  . lisinopril  5 mg Oral Daily  . metoprolol succinate  25 mg Oral Daily  . nicotine  21 mg Transdermal Daily  . ticagrelor  90 mg Oral BID  . vitamin B-12  1,000 mcg Oral Daily   Continuous Infusions: . heparin 850 Units/hr (07/09/16 0937)   PRN Meds: acetaminophen, hydrALAZINE, morphine injection, nitroGLYCERIN, ondansetron (ZOFRAN) IV, traZODone, zolpidem   Vital Signs    Vitals:   07/09/16 1157 07/09/16 2037 07/10/16 0418 07/10/16 0920  BP: 111/69 111/69 121/90 120/71  Pulse: 63 67  70  Resp: 18 18    Temp: 98.7 F (37.1 C) 98.1 F (36.7 C) 98 F (36.7 C)   TempSrc: Oral Oral Oral   SpO2: 99% 98% 98%   Weight:   117 lb (53.1 kg)   Height:        Intake/Output Summary (Last 24 hours) at 07/10/16 1221 Last data filed at 07/10/16 0900  Gross per 24 hour  Intake              120 ml  Output              200 ml  Net              -80 ml   Filed Weights   07/08/16 1739 07/09/16 0033 07/10/16 0418  Weight: 115 lb (52.2 kg) 111 lb 9.6 oz (50.6 kg) 117 lb (53.1 kg)    Telemetry    Sinus rhythm. Personally reviewed.  ECG    Tracing from 07/08/2016 shows sinus rhythm with incomplete right bundle branch block and probable old inferior infarct pattern. Personally reviewed.  Physical Exam   GEN: Thin male. No acute distress.   Neck: No JVD. Cardiac: RRR, no murmur, rub, or gallop.  Respiratory: Nonlabored. Clear to auscultation bilaterally. GI: Soft, nontender, bowel sounds present. MS: No edema; No deformity.  Labs    Chemistry Recent Labs Lab 07/08/16 1751 07/08/16 2015  07/10/16 0247  NA 134*  --  137  K 5.9* 3.7 3.4*  CL 102  --  107  CO2 20*  --  23  GLUCOSE 101*  --  80  BUN 6  --  5*  CREATININE 1.14  --  0.99  CALCIUM 9.4  --  8.5*  PROT  --   --  5.1*  ALBUMIN  --   --  3.0*  AST  --   --  14*  ALT  --   --  14*  ALKPHOS  --   --  52  BILITOT  --   --  1.0  GFRNONAA >60  --  >60  GFRAA >60  --  >60  ANIONGAP 12  --  7     Hematology Recent Labs Lab 07/08/16 1751 07/09/16 0036 07/10/16 0247  WBC 4.6 4.7 3.9*  RBC 4.56 4.44 3.66*  HGB 14.7 14.2 11.5*  HCT 42.3 40.8 33.9*  MCV 92.8 91.9 92.6  MCH 32.2 32.0 31.4  MCHC 34.8 34.8 33.9  RDW 17.2* 16.8*  16.8*  PLT 225 218 188    Cardiac Enzymes Recent Labs Lab 07/08/16 2259  TROPONINI <0.03    Recent Labs Lab 07/08/16 1805  TROPIPOC 0.00     BNP Recent Labs Lab 07/08/16 2300  BNP 23.1     Radiology    Dg Chest 2 View  Result Date: 07/08/2016 CLINICAL DATA:  Chest pain EXAM: CHEST  2 VIEW COMPARISON:  11/19/2015 FINDINGS: Hyperinflation. No focal pulmonary infiltrate, consolidation, or pleural effusion. Normal cardiomediastinal silhouette. No pneumothorax. IMPRESSION: No active cardiopulmonary disease. Electronically Signed   By: Jasmine PangKim  Fujinaga M.D.   On: 07/08/2016 18:25   [name] Contrast  Result Date: 07/09/2016 CLINICAL DATA:  41 y/o  M; left leg numbness. EXAM: MRI HEAD WITHOUT CONTRAST TECHNIQUE: Multiplanar, multiecho pulse sequences of the brain and surrounding structures were obtained without intravenous contrast. COMPARISON:  None. FINDINGS: Brain: No acute infarction, hemorrhage, hydrocephalus, extra-axial collection or mass lesion. Vascular: Normal flow voids. Skull and upper cervical spine: Normal marrow signal. Sinuses/Orbits: Negative. Other: None. IMPRESSION: Normal MRI of the brain. Electronically Signed   By: Mitzi HansenLance  Furusawa-Stratton M.D.   On: 07/09/2016 02:31    Cardiac Studies   Echocardiogram 09/27/2015: Study Conclusions  - Left  ventricle: The cavity size was normal. Wall thickness was   normal. Systolic function was mildly to moderately reduced. The   estimated ejection fraction was in the range of 40% to 45%.   Severe hypokinesis and scarring of the basal-midinferolateral,   inferior, and inferoseptal myocardium; consistent with infarction   in the distribution of the right coronary artery. Left   ventricular diastolic function parameters were normal. - Aortic valve: There was trivial regurgitation.  Patient Profile     41 y.o. male with a history of hypertension, hyperlipidemia, GERD, tobacco use as well as substance abuse, and CAD status post multiple percutaneous coronary interventions as detailed in history and physical. He presents now with unstable angina with plan for cardiac catheterization on Monday as per Dr. Antoine PocheHochrein.  Assessment & Plan    1. Unstable angina in the setting of medication noncompliance. Initial troponin I levels were negative for ACS.  2. Chronic systolic heart failure, LVEF 40-45% range. No clear evidence of volume overload.  3. Hyperlipidemia, Lipitor recent.  4. Essential hypertension, blood pressure currently controlled on Toprol-XL and lisinopril.  Patient for diagnostic cardiac catheterization on Monday. Orders completed.  Signed, Nona DellSamuel McDowell, MD  07/10/2016, 12:21 PM

## 2016-07-11 DIAGNOSIS — I25110 Atherosclerotic heart disease of native coronary artery with unstable angina pectoris: Principal | ICD-10-CM

## 2016-07-11 LAB — CBC
HCT: 35 % — ABNORMAL LOW (ref 39.0–52.0)
Hemoglobin: 11.8 g/dL — ABNORMAL LOW (ref 13.0–17.0)
MCH: 31.3 pg (ref 26.0–34.0)
MCHC: 33.7 g/dL (ref 30.0–36.0)
MCV: 92.8 fL (ref 78.0–100.0)
PLATELETS: 164 10*3/uL (ref 150–400)
RBC: 3.77 MIL/uL — AB (ref 4.22–5.81)
RDW: 16.8 % — AB (ref 11.5–15.5)
WBC: 3.4 10*3/uL — AB (ref 4.0–10.5)

## 2016-07-11 LAB — HEPARIN LEVEL (UNFRACTIONATED): HEPARIN UNFRACTIONATED: 0.5 [IU]/mL (ref 0.30–0.70)

## 2016-07-11 LAB — PROTIME-INR
INR: 1.03
Prothrombin Time: 13.5 seconds (ref 11.4–15.2)

## 2016-07-11 MED ORDER — PREDNISONE 50 MG PO TABS
60.0000 mg | ORAL_TABLET | ORAL | Status: AC
  Administered 2016-07-12: 60 mg via ORAL
  Filled 2016-07-11: qty 1

## 2016-07-11 MED ORDER — ASPIRIN EC 81 MG PO TBEC: 81.0000 mg | DELAYED_RELEASE_TABLET | Freq: Every day | ORAL | Status: DC

## 2016-07-11 MED ORDER — ASPIRIN 81 MG PO CHEW
81.0000 mg | CHEWABLE_TABLET | ORAL | Status: AC
  Administered 2016-07-12: 81 mg via ORAL
  Filled 2016-07-11: qty 1

## 2016-07-11 MED ORDER — PREDNISONE 50 MG PO TABS: 60.0000 mg | ORAL_TABLET | ORAL | Status: DC

## 2016-07-11 MED ORDER — DIPHENHYDRAMINE HCL 50 MG/ML IJ SOLN
25.0000 mg | INTRAMUSCULAR | Status: AC
  Administered 2016-07-12: 25 mg via INTRAVENOUS
  Filled 2016-07-11: qty 1

## 2016-07-11 MED ORDER — SODIUM CHLORIDE 0.9 % WEIGHT BASED INFUSION
1.0000 mL/kg/h | INTRAVENOUS | Status: DC
  Administered 2016-07-12: 1 mL/kg/h via INTRAVENOUS

## 2016-07-11 MED ORDER — SODIUM CHLORIDE 0.9 % WEIGHT BASED INFUSION
3.0000 mL/kg/h | INTRAVENOUS | Status: DC
  Administered 2016-07-12: 3 mL/kg/h via INTRAVENOUS

## 2016-07-11 MED ORDER — FAMOTIDINE IN NACL 20-0.9 MG/50ML-% IV SOLN
20.0000 mg | INTRAVENOUS | Status: AC
  Administered 2016-07-12: 20 mg via INTRAVENOUS
  Filled 2016-07-11: qty 50

## 2016-07-11 MED ORDER — SODIUM CHLORIDE 0.9% FLUSH: 3.0000 mL | INTRAVENOUS | Status: DC | PRN

## 2016-07-11 MED ORDER — SODIUM CHLORIDE 0.9 % IV SOLN: 250.0000 mL | INTRAVENOUS | Status: DC | PRN

## 2016-07-11 MED ORDER — SODIUM CHLORIDE 0.9% FLUSH: 3.0000 mL | Freq: Two times a day (BID) | INTRAVENOUS | Status: DC

## 2016-07-11 NOTE — Progress Notes (Signed)
PROGRESS NOTE                                                                                                                                                                                                             Patient Demographics:    [name], is a 41 y.o. male, DOB - 1975/02/16, ZOX:[id number]  Admit date - 07/08/2016   Admitting Physician Lorretta Harp, MD  Outpatient Primary MD for the patient is Massie Maroon, FNP  LOS - 2  Outpatient Specialists:  Chief Complaint  Patient presents with  . Chest Pain  . 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 He stopped taking his home medication in the past 2 weeks and developed substernal chest pain for past 2 days. Also reported left thigh numbness for past 3 weeks. In the ED labs were stable with normal chest x-ray, stable EKG and negative troponin. Patient admitted for unstable angina and started on IV heparin. Cardiology consulted .  Subjective:   No further chest pain. Left thigh numbness resolved since yesterday.   Assessment  & Plan :    Principal Problem: Unstable angina Continue IV heparin. Resumed aspirin, statin.  continue Brilinta. Continue Toprol. Sublingual nitroglycerin when necessary. Cardiac cath scheduled for tomorrow.  Active Problems: Chronic systolic CHF 2-D echo shows EF of 40-45% with diffuse hypokinesis. Euvolemic on exam. Continue aspirin, lisinopril, beta blocker and statin.   Left thigh numbness Ongoing for past 3 weeks, off and on. MRI brain unremarkable. TSH normal, negative HIV antibody . B12 low normal (290), added supplement.  Reports symptoms to have resolved since yesterday.  Tobacco and marijuana use Counseled strongly on cessation. Nicotine patch.  Hypokalemia Replenished    Chronic Depression  Continue Abilify and trazodone  Protein calorie malnutrition Dietitian consulted        Code Status : Full code   Family Communication  : None At bedside   Disposition Plan  : Home once workup completed   Barriers For Discharge : Cardiac cath tomorrow   Procedures  : 2-D echo  DVT Prophylaxis  :  IV heparin   Lab Results  Component Value Date   PLT 164 07/11/2016    Antibiotics  :    Anti-infectives    None        Objective:   Vitals:   07/10/16 1200 07/10/16 2200 07/11/16 0638 07/11/16 1007  BP: 122/85 (!) 129/93 109/81   Pulse: (!) 57 (!) 49 (!) 51 82  Resp: 18     Temp: 98 F (36.7 C) 98 F (36.7 C) 97.8 F (36.6 C)   TempSrc: Oral Oral Oral   SpO2: 100% 100% 100%   Weight:   53.8 kg (118 lb 9.7 oz)   Height:        Wt Readings from Last 3 Encounters:  07/11/16 53.8 kg (118 lb 9.7 oz)  03/23/16 59 kg (130 lb)  11/25/15 61.7 kg (136 lb)     Intake/Output Summary (Last 24 hours) at 07/11/16 1208 Last data filed at 07/11/16 0600  Gross per 24 hour  Intake             1720 ml  Output             1585 ml  Net              135 ml     Physical Exam  Gen: not in distress HEENT: Temporal wasting, moist mucosa, supple neck Chest: clear b/l, no added sounds CVS: N S1&S2, no murmurs, rubs or gallop GI: soft, NT, ND,  Musculoskeletal: warm, no edema CNSAlert and oriented, normal strength in lower extremity, numbness in left thigh    Data Review:    CBC  Recent Labs Lab 07/08/16 1751 07/09/16 0036 07/10/16 0247 07/11/16 0400  WBC 4.6 4.7 3.9* 3.4*  HGB 14.7 14.2 11.5* 11.8*  HCT 42.3 40.8 33.9* 35.0*  PLT 225 218 188 164  MCV 92.8 91.9 92.6 92.8  MCH 32.2 32.0 31.4 31.3  MCHC 34.8 34.8 33.9 33.7  RDW 17.2* 16.8* 16.8* 16.8*    Chemistries   Recent  Labs Lab 07/08/16 1751 07/08/16 2015 07/10/16 0247  NA 134*  --  137  K 5.9* 3.7 3.4*  CL 102  --  107  CO2 20*  --  23  GLUCOSE 101*  --  80  BUN 6  --  5*  CREATININE 1.14  --  0.99  CALCIUM 9.4  --  8.5*  AST  --   --  14*  ALT  --   --  14*  ALKPHOS  --   --  52  BILITOT  --   --  1.0   ------------------------------------------------------------------------------------------------------------------  Recent Labs  07/09/16 0036  CHOL 262*  HDL 47  LDLCALC 202*  TRIG 66  CHOLHDL 5.6    Lab Results  Component Value Date   HGBA1C 5.1 07/09/2016   ------------------------------------------------------------------------------------------------------------------  Recent Labs  07/09/16 0555  TSH 0.790   ------------------------------------------------------------------------------------------------------------------  Recent Labs  07/09/16 0555  VITAMINB12 290    Coagulation profile No results for input(s): INR, PROTIME in the last 168 hours.  No results for input(s): DDIMER in the last 72 hours.  Cardiac Enzymes  Recent Labs Lab 07/08/16 2259  TROPONINI <0.03   ------------------------------------------------------------------------------------------------------------------    Component Value Date/Time   BNP 23.1 07/08/2016 2300    Inpatient Medications  Scheduled Meds: . ARIPiprazole  5 mg Oral BID  . aspirin EC  81 mg Oral Daily  . atorvastatin  80 mg Oral q1800  . dicyclomine  20 mg Oral  BID  . famotidine  20 mg Oral BID  . lisinopril  5 mg Oral Daily  . metoprolol succinate  25 mg Oral Daily  . nicotine  21 mg Transdermal Daily  . ticagrelor  90 mg Oral BID  . vitamin B-12  1,000 mcg Oral Daily   Continuous Infusions: . heparin 850 Units/hr (07/10/16 1744)   PRN Meds:.acetaminophen, hydrALAZINE, morphine injection, nitroGLYCERIN, ondansetron (ZOFRAN) IV, traZODone, zolpidem  Micro Results No results found for this or any previous  visit (from the past 240 hour(s)).  Radiology Reports Dg Chest 2 View  Result Date: 07/08/2016 CLINICAL DATA:  Chest pain EXAM: CHEST  2 VIEW COMPARISON:  11/19/2015 FINDINGS: Hyperinflation. No focal pulmonary infiltrate, consolidation, or pleural effusion. Normal cardiomediastinal silhouette. No pneumothorax. IMPRESSION: No active cardiopulmonary disease. Electronically Signed   By: Jasmine PangKim  Fujinaga M.D.   On: 07/08/2016 18:25   [name] Contrast  Result Date: 07/09/2016 CLINICAL DATA:  41 y/o  M; left leg numbness. EXAM: MRI HEAD WITHOUT CONTRAST TECHNIQUE: Multiplanar, multiecho pulse sequences of the brain and surrounding structures were obtained without intravenous contrast. COMPARISON:  None. FINDINGS: Brain: No acute infarction, hemorrhage, hydrocephalus, extra-axial collection or mass lesion. Vascular: Normal flow voids. Skull and upper cervical spine: Normal marrow signal. Sinuses/Orbits: Negative. Other: None. IMPRESSION: Normal MRI of the brain. Electronically Signed   By: Mitzi HansenLance  Furusawa-Stratton M.D.   On: 07/09/2016 02:31    Time Spent in minutes  35   Eddie NorthHUNGEL, Timberlynn Kizziah M.D on 07/11/2016 at 12:08 PM  Between 7am to 7pm - Pager - [phone]  After 7pm go to [email or web address] - password [email or web address]  [facility] -  Office  [phone]

## 2016-07-11 NOTE — Progress Notes (Signed)
ANTICOAGULATION CONSULT NOTE  Pharmacy Consult for heparin Indication: chest pain/ACS  Allergies  Allergen Reactions  . Iodine Anaphylaxis and Swelling  . Shellfish Allergy Anaphylaxis    All shellfish  . Contrast Media [Iodinated Diagnostic Agents] Nausea And Vomiting    Patient Measurements: Height: 5\' 11"  (180.3 cm) Weight: 118 lb 9.7 oz (53.8 kg) IBW/kg (Calculated) : 75.3 Heparin Dosing Weight: 52kg  Vital Signs: Temp: 97.8 F (36.6 C) (07/01 0638) Temp Source: Oral (07/01 52840638) BP: 109/81 (07/01 13240638) Pulse Rate: 82 (07/01 1007)  Labs:  Recent Labs  07/08/16 1751 07/08/16 2259 07/09/16 0036  07/09/16 1510 07/10/16 0247 07/11/16 0400  HGB 14.7  --  14.2  --   --  11.5* 11.8*  HCT 42.3  --  40.8  --   --  33.9* 35.0*  PLT 225  --  218  --   --  188 164  HEPARINUNFRC  --   --  0.47  < > 0.46 0.46 0.50  CREATININE 1.14  --   --   --   --  0.99  --   TROPONINI  --  <0.03  --   --   --   --   --   < > = values in this interval not displayed.  Estimated Creatinine Clearance: 77 mL/min (by C-G formula based on SCr of 0.99 mg/dL).   Assessment: [age] YOM with significant cardiac history now on heparin for chest pain.   Heparin level remains therapeutic on 850 units/hr. Hgb has trended down but stable today and no reported s/s bleeding.   Goal of Therapy:  Heparin level 0.3-0.7 units/ml Monitor platelets by anticoagulation protocol: Yes   Plan:  Continue heparin rate at 850 units/hr Daily heparin level and CBC [facility]  Staley Lunz K. Bonnye FavaNicolsen, PharmD, BCPS, CPP Clinical Pharmacist Pager: [phone] Phone: [phone] 07/11/2016 10:08 AM

## 2016-07-11 NOTE — Progress Notes (Signed)
Progress Note  Patient Name: [name] Date of Encounter: 07/11/2016  Primary Cardiologist: Dr. Nicki Guadalajara  Subjective   No chest pain, shortness of breath, or palpitations.  Inpatient Medications    Scheduled Meds: . ARIPiprazole  5 mg Oral BID  . aspirin EC  81 mg Oral Daily  . atorvastatin  80 mg Oral q1800  . dicyclomine  20 mg Oral BID  . famotidine  20 mg Oral BID  . lisinopril  5 mg Oral Daily  . metoprolol succinate  25 mg Oral Daily  . nicotine  21 mg Transdermal Daily  . ticagrelor  90 mg Oral BID  . vitamin B-12  1,000 mcg Oral Daily   Continuous Infusions: . heparin 850 Units/hr (07/10/16 1744)   PRN Meds: acetaminophen, hydrALAZINE, morphine injection, nitroGLYCERIN, ondansetron (ZOFRAN) IV, traZODone, zolpidem   Vital Signs    Vitals:   07/10/16 1200 07/10/16 2200 07/11/16 0638 07/11/16 1007  BP: 122/85 (!) 129/93 109/81   Pulse: (!) 57 (!) 49 (!) 51 82  Resp: 18     Temp: 98 F (36.7 C) 98 F (36.7 C) 97.8 F (36.6 C)   TempSrc: Oral Oral Oral   SpO2: 100% 100% 100%   Weight:   118 lb 9.7 oz (53.8 kg)   Height:        Intake/Output Summary (Last 24 hours) at 07/11/16 1020 Last data filed at 07/11/16 0600  Gross per 24 hour  Intake             1720 ml  Output             1585 ml  Net              135 ml   Filed Weights   07/09/16 0033 07/10/16 0418 07/11/16 9147  Weight: 111 lb 9.6 oz (50.6 kg) 117 lb (53.1 kg) 118 lb 9.7 oz (53.8 kg)    Telemetry    Sinus rhythm. Personally reviewed.  ECG    Tracing from 07/08/2016 shows sinus rhythm with incomplete right bundle branch block and probable old inferior infarct pattern. Personally reviewed.  Physical Exam   GEN: Thin male. No acute distress.   Neck: No JVD. Cardiac: RRR, no murmur, rub, or gallop.  Respiratory: Nonlabored. Clear to auscultation bilaterally. GI: Soft, nontender, bowel sounds present. MS: No edema; No deformity.  Labs    Chemistry  Recent Labs Lab  07/08/16 1751 07/08/16 2015 07/10/16 0247  NA 134*  --  137  K 5.9* 3.7 3.4*  CL 102  --  107  CO2 20*  --  23  GLUCOSE 101*  --  80  BUN 6  --  5*  CREATININE 1.14  --  0.99  CALCIUM 9.4  --  8.5*  PROT  --   --  5.1*  ALBUMIN  --   --  3.0*  AST  --   --  14*  ALT  --   --  14*  ALKPHOS  --   --  52  BILITOT  --   --  1.0  GFRNONAA >60  --  >60  GFRAA >60  --  >60  ANIONGAP 12  --  7     Hematology  Recent Labs Lab 07/09/16 0036 07/10/16 0247 07/11/16 0400  WBC 4.7 3.9* 3.4*  RBC 4.44 3.66* 3.77*  HGB 14.2 11.5* 11.8*  HCT 40.8 33.9* 35.0*  MCV 91.9 92.6 92.8  MCH 32.0 31.4 31.3  MCHC  34.8 33.9 33.7  RDW 16.8* 16.8* 16.8*  PLT 218 188 164    Cardiac Enzymes  Recent Labs Lab 07/08/16 2259  TROPONINI <0.03     Recent Labs Lab 07/08/16 1805  TROPIPOC 0.00     BNP  Recent Labs Lab 07/08/16 2300  BNP 23.1     Radiology    No results found.  Cardiac Studies   Echocardiogram 07/10/2016: Study Conclusions  - Left ventricle: The cavity size was normal. Wall thickness was   normal. Systolic function was mildly to moderately reduced. The   estimated ejection fraction was in the range of 40% to 45%.   Diffuse hypokinesis. Left ventricular diastolic function   parameters were normal. - Aortic valve: Mildly calcified annulus. Trileaflet. There was   trivial regurgitation. - Mitral valve: Mildly thickened leaflets . There was trivial   regurgitation. Valve area by pressure half-time: 1.52 cm^2. - Right atrium: Central venous pressure (est): 8 mm Hg. - Atrial septum: No defect or patent foramen ovale was identified. - Tricuspid valve: There was trivial regurgitation. - Pulmonary arteries: Systolic pressure could not be accurately   estimated. - Pericardium, extracardiac: There was no pericardial effusion.  Impressions:  - Normal LV wall thickness with LVEF 40-45% and diffuse   hypokinesis. Grossly normal diastolic function. Mildly  thickened   mitral leaflets with trivial mitral regurgitation. Trivial aortic   regurgitation. Trivial tricuspid regurgitation.  Patient Profile     41 y.o. male with a history of hypertension, hyperlipidemia, GERD, tobacco use as well as substance abuse, and CAD status post multiple percutaneous coronary interventions as detailed in history and physical. He presents now with unstable angina with plan for cardiac catheterization on Monday as per Dr. Antoine PocheHochrein.  Assessment & Plan    1. Unstable angina in the setting of medication noncompliance. Initial troponin I levels were negative for ACS.  2. Chronic systolic heart failure, LVEF 40-45% range by follow-up echocardiogram yesterday. No clear evidence of volume overload.  3. Hyperlipidemia, Lipitor recent.  4. Essential hypertension, blood pressure currently controlled on Toprol-XL and lisinopril.  Patient for diagnostic cardiac catheterization on Monday. Orders completed.  Signed, Nona DellSamuel Zymiere Trostle, MD  07/11/2016, 10:20 AM

## 2016-07-12 ENCOUNTER — Emergency Department (HOSPITAL_COMMUNITY)
Admission: EM | Admit: 2016-07-12 | Discharge: 2016-07-13 | Disposition: A | Discharge status: Home / Self Care | Payer: Medicare Other | Attending: Emergency Medicine | Admitting: Emergency Medicine

## 2016-07-12 ENCOUNTER — Encounter (HOSPITAL_COMMUNITY): Payer: Self-pay | Admitting: Cardiology

## 2016-07-12 ENCOUNTER — Encounter (HOSPITAL_COMMUNITY)
Admission: EM | Disposition: A | Discharge status: Home / Self Care | Payer: Self-pay | Source: Home / Self Care | Attending: Internal Medicine

## 2016-07-12 DIAGNOSIS — I2510 Atherosclerotic heart disease of native coronary artery without angina pectoris: Secondary | ICD-10-CM

## 2016-07-12 DIAGNOSIS — E784 Other hyperlipidemia: Secondary | ICD-10-CM

## 2016-07-12 DIAGNOSIS — I97610 Postprocedural hemorrhage and hematoma of a circulatory system organ or structure following a cardiac catheterization: Secondary | ICD-10-CM

## 2016-07-12 DIAGNOSIS — Z79899 Other long term (current) drug therapy: Secondary | ICD-10-CM | POA: Insufficient documentation

## 2016-07-12 DIAGNOSIS — F17210 Nicotine dependence, cigarettes, uncomplicated: Secondary | ICD-10-CM | POA: Insufficient documentation

## 2016-07-12 DIAGNOSIS — I255 Ischemic cardiomyopathy: Secondary | ICD-10-CM

## 2016-07-12 DIAGNOSIS — I119 Hypertensive heart disease without heart failure: Secondary | ICD-10-CM | POA: Insufficient documentation

## 2016-07-12 DIAGNOSIS — Z7982 Long term (current) use of aspirin: Secondary | ICD-10-CM | POA: Insufficient documentation

## 2016-07-12 HISTORY — PX: LEFT HEART CATH AND CORONARY ANGIOGRAPHY: CATH118249

## 2016-07-12 LAB — BASIC METABOLIC PANEL
ANION GAP: 8 (ref 5–15)
CALCIUM: 9.1 mg/dL (ref 8.9–10.3)
CO2: 21 mmol/L — ABNORMAL LOW (ref 22–32)
Chloride: 108 mmol/L (ref 101–111)
Creatinine, Ser: 0.94 mg/dL (ref 0.61–1.24)
GFR calc Af Amer: >60 mL/min (ref >60)
GLUCOSE: 102 mg/dL — AB (ref 65–99)
Potassium: 4.5 mmol/L (ref 3.5–5.1)
Sodium: 137 mmol/L (ref 135–145)

## 2016-07-12 LAB — CARDIAC CATHETERIZATION: CATHEFQUANT: 45 %

## 2016-07-12 LAB — CBC
HCT: 39.5 % (ref 39.0–52.0)
Hemoglobin: 13.3 g/dL (ref 13.0–17.0)
MCH: 31.4 pg (ref 26.0–34.0)
MCHC: 33.7 g/dL (ref 30.0–36.0)
MCV: 93.4 fL (ref 78.0–100.0)
PLATELETS: 191 10*3/uL (ref 150–400)
RBC: 4.23 MIL/uL (ref 4.22–5.81)
RDW: 16.6 % — AB (ref 11.5–15.5)
WBC: 3.5 10*3/uL — AB (ref 4.0–10.5)

## 2016-07-12 LAB — POCT ACTIVATED CLOTTING TIME: ACTIVATED CLOTTING TIME: 147 s

## 2016-07-12 LAB — HEPARIN LEVEL (UNFRACTIONATED): HEPARIN UNFRACTIONATED: 0.75 [IU]/mL — AB (ref 0.30–0.70)

## 2016-07-12 SURGERY — LEFT HEART CATH AND CORONARY ANGIOGRAPHY
Anesthesia: LOCAL

## 2016-07-12 MED ORDER — NICOTINE 21 MG/24HR TD PT24: 21.0000 mg | MEDICATED_PATCH | TRANSDERMAL | 1 refills | Status: DC

## 2016-07-12 MED ORDER — LIDOCAINE HCL 1 % IJ SOLN
INTRAMUSCULAR | Status: AC
  Filled 2016-07-12: qty 20

## 2016-07-12 MED ORDER — SODIUM CHLORIDE 0.9% FLUSH: 3.0000 mL | INTRAVENOUS | Status: DC | PRN

## 2016-07-12 MED ORDER — SODIUM CHLORIDE 0.9% FLUSH: 3.0000 mL | Freq: Two times a day (BID) | INTRAVENOUS | Status: DC

## 2016-07-12 MED ORDER — MIDAZOLAM HCL 2 MG/2ML IJ SOLN
INTRAMUSCULAR | Status: AC
  Filled 2016-07-12: qty 2

## 2016-07-12 MED ORDER — FENTANYL CITRATE (PF) 100 MCG/2ML IJ SOLN
INTRAMUSCULAR | Status: DC | PRN
  Administered 2016-07-12: 25 ug via INTRAVENOUS

## 2016-07-12 MED ORDER — LISINOPRIL 5 MG PO TABS: 5.0000 mg | ORAL_TABLET | Freq: Every day | ORAL | 0 refills | Status: DC

## 2016-07-12 MED ORDER — ENSURE ENLIVE PO LIQD: 237.0000 mL | Freq: Two times a day (BID) | ORAL | 12 refills | Status: DC

## 2016-07-12 MED ORDER — ATORVASTATIN CALCIUM 80 MG PO TABS: 80.0000 mg | ORAL_TABLET | Freq: Every day | ORAL | 0 refills | Status: DC

## 2016-07-12 MED ORDER — MIDAZOLAM HCL 2 MG/2ML IJ SOLN
INTRAMUSCULAR | Status: DC | PRN
  Administered 2016-07-12: 2 mg via INTRAVENOUS

## 2016-07-12 MED ORDER — HEPARIN (PORCINE) IN NACL 2-0.9 UNIT/ML-% IJ SOLN
INTRAMUSCULAR | Status: DC | PRN
  Administered 2016-07-12: 09:00:00

## 2016-07-12 MED ORDER — HEPARIN (PORCINE) IN NACL 2-0.9 UNIT/ML-% IJ SOLN
INTRAMUSCULAR | Status: AC
  Filled 2016-07-12: qty 1000

## 2016-07-12 MED ORDER — TICAGRELOR 90 MG PO TABS: 90.0000 mg | ORAL_TABLET | Freq: Two times a day (BID) | ORAL | 0 refills | Status: DC

## 2016-07-12 MED ORDER — NITROGLYCERIN 1 MG/10 ML FOR IR/CATH LAB
INTRA_ARTERIAL | Status: AC
  Filled 2016-07-12: qty 10

## 2016-07-12 MED ORDER — NITROGLYCERIN 0.4 MG SL SUBL: 0.4000 mg | SUBLINGUAL_TABLET | SUBLINGUAL | 0 refills | Status: DC | PRN

## 2016-07-12 MED ORDER — ASPIRIN 81 MG PO TBEC: 81.0000 mg | DELAYED_RELEASE_TABLET | Freq: Every day | ORAL | 0 refills | Status: DC

## 2016-07-12 MED ORDER — FENTANYL CITRATE (PF) 100 MCG/2ML IJ SOLN
INTRAMUSCULAR | Status: AC
  Filled 2016-07-12: qty 2

## 2016-07-12 MED ORDER — LIDOCAINE HCL (PF) 1 % IJ SOLN
INTRAMUSCULAR | Status: DC | PRN
  Administered 2016-07-12: 15 mL

## 2016-07-12 MED ORDER — IOPAMIDOL (ISOVUE-370) INJECTION 76%
INTRAVENOUS | Status: DC | PRN
  Administered 2016-07-12: 65 mL

## 2016-07-12 MED ORDER — SODIUM CHLORIDE 0.9 % IV SOLN: 250.0000 mL | INTRAVENOUS | Status: DC | PRN

## 2016-07-12 MED ORDER — IOPAMIDOL (ISOVUE-370) INJECTION 76%
INTRAVENOUS | Status: AC
  Filled 2016-07-12: qty 100

## 2016-07-12 MED ORDER — CYANOCOBALAMIN 1000 MCG PO TABS: 1000.0000 ug | ORAL_TABLET | Freq: Every day | ORAL | 2 refills | Status: DC

## 2016-07-12 MED ORDER — METOPROLOL SUCCINATE ER 25 MG PO TB24: 25.0000 mg | ORAL_TABLET | Freq: Every day | ORAL | 0 refills | Status: DC

## 2016-07-12 MED ORDER — SODIUM CHLORIDE 0.9 % WEIGHT BASED INFUSION: 1.0000 mL/kg/h | INTRAVENOUS | Status: AC

## 2016-07-12 SURGICAL SUPPLY — 8 items
CATH INFINITI 5FR MULTPACK ANG (CATHETERS) ×2 IMPLANT
KIT HEART LEFT (KITS) ×2 IMPLANT
PACK CARDIAC CATHETERIZATION (CUSTOM PROCEDURE TRAY) ×2 IMPLANT
SHEATH PINNACLE 5F 10CM (SHEATH) ×2 IMPLANT
SYR MEDRAD MARK V 150ML (SYRINGE) ×2 IMPLANT
TRANSDUCER W/STOPCOCK (MISCELLANEOUS) ×2 IMPLANT
TUBING CIL FLEX 10 FLL-RA (TUBING) ×2 IMPLANT
WIRE EMERALD 3MM-J .035X150CM (WIRE) ×2 IMPLANT

## 2016-07-12 NOTE — Progress Notes (Signed)
Pt scheduled for cath today at 1330. Pt going early to cath lab. All pre-cath meds given for pt allergy. Heparin stopped on-call to cath lab.

## 2016-07-12 NOTE — Progress Notes (Signed)
Pt has orders to be discharged. Discharge instructions given and pt has no additional questions at this time. Medication regimen reviewed and pt educated. Pt verbalized understanding and has no additional questions. Telemetry box removed. IV removed and site in good condition. Pt stable and waiting for transportation.  Tytus Strahle RN 

## 2016-07-12 NOTE — ED Triage Notes (Addendum)
Pt had a heart cath today, on his way home he reports it started to bleed.  Pt reports it has been bleeding since 7:30pm and not stopped since.  No pain reported, denies lightheadness, SOB, LOC.  Pt reports he is "a little dizzy".  Pt did drink ETOH this evening.

## 2016-07-12 NOTE — Discharge Instructions (Signed)
Acute Coronary Syndrome °Acute coronary syndrome (ACS) is a serious problem in which there is suddenly not enough blood and oxygen supplied to the heart. ACS may mean that one or more of the blood vessels in your heart (coronary arteries) may be blocked. ACS can result in chest pain or a heart attack (myocardial infarction or MI). °What are the causes? °This condition is caused by atherosclerosis, which is the buildup of fat and cholesterol (plaque) on the inside of the arteries. Over time, the plaque may narrow or block the artery, and this will lessen blood flow to the heart. Plaque can also become weak and break off within a coronary artery to form a clot and cause a sudden blockage. °What increases the risk? °The risk factors of this condition include: °· High cholesterol levels. °· High blood pressure (hypertension). °· Smoking. °· Diabetes. °· Age. °· Family history of chest pain, heart disease, or stroke. °· Lack of exercise. °What are the signs or symptoms? °The most common signs of this condition include: °· Chest pain, which can be: °¨ A crushing or squeezing in the chest. °¨ A tightness, pressure, fullness, or heaviness in the chest. °¨ Present for more than a few minutes, or it can stop and recur. °· Pain in the arms, neck, jaw, or back. °· Unexplained heartburn or indigestion. °· Shortness of breath. °· Nausea. °· Sudden cold sweats. °· Feeling light-headed or dizzy. °Sometimes, this condition has no symptoms. °How is this diagnosed? °ACS may be diagnosed through the following tests: °· Electrocardiogram (ECG). °· Blood tests. °· Coronary angiogram. This is a procedure to look at the coronary arteries to see if there is any blockage. °How is this treated? °Treatment for ACS may include: °· Healthy behavioral changes to reduce or control risk factors. °· Medicine. °· Coronary stenting. A stent helps to keep an artery open. °· Coronary angioplasty. This procedure widens a narrowed or blocked  artery. °· Coronary artery bypass surgery. This will allow your blood to pass the blockage (bypass) to reach your heart. °Follow these instructions at home: °Eating and drinking °· Follow a heart-healthy diet. A dietitian can you help to educate you about healthy food options and changes. °· Use healthy cooking methods such as roasting, grilling, broiling, baking, poaching, steaming, or stir-frying. Talk to a dietitian to learn more about healthy cooking methods. °Medicines °· Take medicines only as directed by your health care provider. °· Do not take the following medicines unless your health care provider approves: °¨ Nonsteroidal anti-inflammatory drugs (NSAIDs), such as ibuprofen, naproxen, or celecoxib. °¨ Vitamin supplements that contain vitamin A, vitamin E, or both. °¨ Hormone replacement therapy that contains estrogen with or without progestin. °· Stop illegal drug use. °Activity °· Follow an exercise program that is approved by your health care provider. °· Plan rest periods when you are fatigued. °Lifestyle °· Do not use any tobacco products, including cigarettes, chewing tobacco, or electronic cigarettes. If you need help quitting, ask your health care provider. °· If you drink alcohol, and your health care provider approves, limit your alcohol intake to no more than 1 drink per day. One drink equals 12 ounces of beer, 5 ounces of wine, or 1½ ounces of hard liquor. °· Learn to manage stress. °· Maintain a healthy weight. Lose weight as approved by your health care provider. °General instructions °· Manage other health conditions, such as hypertension and diabetes, as directed by your health care provider. °· Keep all follow-up visits as directed by your   health care provider. This is important. °· Your health care provider may ask you to monitor your blood pressure. A blood pressure reading consists of a higher number over a lower number, such as 110 over 72, written as 110/72. Ideally, your blood  pressure should be: °¨ Below 140/90 if you have no other medical conditions. °¨ Below 130/80 if you have diabetes or kidney disease. °Get help right away if: °· You have pain in your chest, neck, arm, jaw, stomach, or back that lasts more than a few minutes, is recurring, or is not relieved by taking medicine under your tongue (sublingual nitroglycerin). °· You have profuse sweating without cause. °· You have unexplained: °¨ Heartburn or indigestion. °¨ Shortness of breath or difficulty breathing. °¨ Nausea or vomiting. °¨ Fatigue. °¨ Feelings of nervousness or anxiety. °¨ Weakness. °¨ Diarrhea. °· You have sudden light-headedness or dizziness. °· You faint. °These symptoms may represent a serious problem that is an emergency. Do not wait to see if the symptoms will go away. Get medical help right away. Call your local emergency services (911 in the [place] Do not drive yourself to the clinic or hospital.  °This information is not intended to replace advice given to you by your health care provider. Make sure you discuss any questions you have with your health care provider. °Document Released: 12/28/2004 Document Revised: 06/11/2015 Document Reviewed: 05/01/2013 °Elsevier Interactive Patient Education © 2017 [employer] ° °

## 2016-07-12 NOTE — H&P (View-Only) (Signed)
Progress Note  Patient Name: [name] Date of Encounter: 07/11/2016  Primary Cardiologist: Dr. Nicki Guadalajara  Subjective   No chest pain, shortness of breath, or palpitations.  Inpatient Medications    Scheduled Meds: . ARIPiprazole  5 mg Oral BID  . aspirin EC  81 mg Oral Daily  . atorvastatin  80 mg Oral q1800  . dicyclomine  20 mg Oral BID  . famotidine  20 mg Oral BID  . lisinopril  5 mg Oral Daily  . metoprolol succinate  25 mg Oral Daily  . nicotine  21 mg Transdermal Daily  . ticagrelor  90 mg Oral BID  . vitamin B-12  1,000 mcg Oral Daily   Continuous Infusions: . heparin 850 Units/hr (07/10/16 1744)   PRN Meds: acetaminophen, hydrALAZINE, morphine injection, nitroGLYCERIN, ondansetron (ZOFRAN) IV, traZODone, zolpidem   Vital Signs    Vitals:   07/10/16 1200 07/10/16 2200 07/11/16 0638 07/11/16 1007  BP: 122/85 (!) 129/93 109/81   Pulse: (!) 57 (!) 49 (!) 51 82  Resp: 18     Temp: 98 F (36.7 C) 98 F (36.7 C) 97.8 F (36.6 C)   TempSrc: Oral Oral Oral   SpO2: 100% 100% 100%   Weight:   118 lb 9.7 oz (53.8 kg)   Height:        Intake/Output Summary (Last 24 hours) at 07/11/16 1020 Last data filed at 07/11/16 0600  Gross per 24 hour  Intake             1720 ml  Output             1585 ml  Net              135 ml   Filed Weights   07/09/16 0033 07/10/16 0418 07/11/16 9147  Weight: 111 lb 9.6 oz (50.6 kg) 117 lb (53.1 kg) 118 lb 9.7 oz (53.8 kg)    Telemetry    Sinus rhythm. Personally reviewed.  ECG    Tracing from 07/08/2016 shows sinus rhythm with incomplete right bundle branch block and probable old inferior infarct pattern. Personally reviewed.  Physical Exam   GEN: Thin male. No acute distress.   Neck: No JVD. Cardiac: RRR, no murmur, rub, or gallop.  Respiratory: Nonlabored. Clear to auscultation bilaterally. GI: Soft, nontender, bowel sounds present. MS: No edema; No deformity.  Labs    Chemistry  Recent Labs Lab  07/08/16 1751 07/08/16 2015 07/10/16 0247  NA 134*  --  137  K 5.9* 3.7 3.4*  CL 102  --  107  CO2 20*  --  23  GLUCOSE 101*  --  80  BUN 6  --  5*  CREATININE 1.14  --  0.99  CALCIUM 9.4  --  8.5*  PROT  --   --  5.1*  ALBUMIN  --   --  3.0*  AST  --   --  14*  ALT  --   --  14*  ALKPHOS  --   --  52  BILITOT  --   --  1.0  GFRNONAA >60  --  >60  GFRAA >60  --  >60  ANIONGAP 12  --  7     Hematology  Recent Labs Lab 07/09/16 0036 07/10/16 0247 07/11/16 0400  WBC 4.7 3.9* 3.4*  RBC 4.44 3.66* 3.77*  HGB 14.2 11.5* 11.8*  HCT 40.8 33.9* 35.0*  MCV 91.9 92.6 92.8  MCH 32.0 31.4 31.3  MCHC  34.8 33.9 33.7  RDW 16.8* 16.8* 16.8*  PLT 218 188 164    Cardiac Enzymes  Recent Labs Lab 07/08/16 2259  TROPONINI <0.03     Recent Labs Lab 07/08/16 1805  TROPIPOC 0.00     BNP  Recent Labs Lab 07/08/16 2300  BNP 23.1     Radiology    No results found.  Cardiac Studies   Echocardiogram 07/10/2016: Study Conclusions  - Left ventricle: The cavity size was normal. Wall thickness was   normal. Systolic function was mildly to moderately reduced. The   estimated ejection fraction was in the range of 40% to 45%.   Diffuse hypokinesis. Left ventricular diastolic function   parameters were normal. - Aortic valve: Mildly calcified annulus. Trileaflet. There was   trivial regurgitation. - Mitral valve: Mildly thickened leaflets . There was trivial   regurgitation. Valve area by pressure half-time: 1.52 cm^2. - Right atrium: Central venous pressure (est): 8 mm Hg. - Atrial septum: No defect or patent foramen ovale was identified. - Tricuspid valve: There was trivial regurgitation. - Pulmonary arteries: Systolic pressure could not be accurately   estimated. - Pericardium, extracardiac: There was no pericardial effusion.  Impressions:  - Normal LV wall thickness with LVEF 40-45% and diffuse   hypokinesis. Grossly normal diastolic function. Mildly  thickened   mitral leaflets with trivial mitral regurgitation. Trivial aortic   regurgitation. Trivial tricuspid regurgitation.  Patient Profile     41 y.o. male with a history of hypertension, hyperlipidemia, GERD, tobacco use as well as substance abuse, and CAD status post multiple percutaneous coronary interventions as detailed in history and physical. He presents now with unstable angina with plan for cardiac catheterization on Monday as per Dr. Antoine PocheHochrein.  Assessment & Plan    1. Unstable angina in the setting of medication noncompliance. Initial troponin I levels were negative for ACS.  2. Chronic systolic heart failure, LVEF 40-45% range by follow-up echocardiogram yesterday. No clear evidence of volume overload.  3. Hyperlipidemia, Lipitor recent.  4. Essential hypertension, blood pressure currently controlled on Toprol-XL and lisinopril.  Patient for diagnostic cardiac catheterization on Monday. Orders completed.  Signed, Nona DellSamuel Zander Ingham, MD  07/11/2016, 10:20 AM

## 2016-07-12 NOTE — Progress Notes (Signed)
Pt back from cath. R femoral site level 0. Bedrest began at 1030 and will end at 1430.

## 2016-07-12 NOTE — Discharge Summary (Addendum)
Physician Discharge Summary  [name] [id number] DOB: 10-Aug-1975 DOA: 07/08/2016  PCP: Massie MaroonHollis, Lachina M, FNP  Admit date: 07/08/2016 Discharge date: 07/12/2016  Admitted From:  HOME Disposition:  HOME Recommendations for Outpatient Follow-up:  1. Follow up with PCP in 1-2 weeks 2. Follow up with cardiology in 3-4 weeks ( office will call)  Home Health: NONE Equipment/Devices: NONE  Discharge Condition:fair CODE STATUS: full code Diet recommendation: Heart Healthy     Discharge Diagnoses:   Principle problem:   Unstable angina (HCC)  Active Problems:   Depression   Essential hypertension   HLD (hyperlipidemia)   Hx of medication noncompliance   Tobacco abuse   Nausea with vomiting   Tetrahydrocannabinol (THC) use disorder, severe, dependence (HCC)   Chronic systolic CHF (congestive heart failure) (HCC)   Left leg numbness   Protein calorie malnutrition, moderate.    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 He stopped taking his home medication in the past 2 weeks and developed substernal chest pain for past 2 days. Also reported left thigh numbness for past 3 weeks. In the ED labs were stable with normal chest x-ray, stable EKG and negative troponin. Patient admitted for unstable angina and started on IV heparin. Cardiology consulted .  Hospital course Principal Problem: Unstable angina Continue IV heparin. Resumed aspirin, statin.  continue Brilinta. Continue Toprol. Sublingual nitroglycerin when necessary. Cardiac cath done today shows  Single vessel obstructive CAD involving a small diagonal branch. Patent stents in the RCA.  Mild to moderate LV dysfunction with EF  45%. Mid inferior akinesis. Normal LVEDP.  Cardiology recommend medical therapy. patient counseled strongly on medication adherence and outpatient follow up.  Active Problems: Chronic systolic CHF 2-D echo shows EF of 40-45% with diffuse hypokinesis. Euvolemic on exam. Continue aspirin, lisinopril, beta blocker and statin. ( 30 days rescriptions given)   Left thigh numbness Ongoing for past 3 weeks, off and on. MRI brain unremarkable. TSH normal, negative HIV antibody . B12 low normal (290), added supplement. Reports symptoms to have resolved since initiating vitamin b12. Recheck levels in 3-4 months.  Tobacco and marijuana use Counseled strongly on cessation. Nicotine patch prescribed .  Hypokalemia Replenished    Chronic Depression  Continue Abilify and trazodone  Protein calorie malnutrition, moderate  added supplement   patient stable post cath. Can be discharged home with outpt follow up.     Family Communication  : None At bedside   Disposition Plan  : Home    Pro[facility]ures  [facility]  [facility]  Discharge Instructions   Allergies as of 07/12/2016      Reactions   Iodine Anaphylaxis, Swelling   Shellfish Allergy Anaphylaxis   All shellfish   Contrast Media [iodinated Diagnostic Agents] Nausea And Vomiting      Medication List    STOP taking these medications   dicyclomine 20 MG tablet Commonly known as:  BENTYL   loperamide 2 MG capsule Commonly known as:  IMODIUM   multivitamin with minerals Tabs tablet   naproxen 500 MG tablet Commonly known as:  NAPROSYN   promethazine 25 MG tablet Commonly known as:  PHENERGAN     TAKE these medications   ARIPiprazole 5 MG tablet Commonly known as:  ABILIFY Take 1 tablet (  5 mg total) by mouth 2 (two) times daily.   aspirin 81 MG EC tablet Take 1 tablet (81 mg total) by mouth daily.   atorvastatin 80 MG tablet Commonly known as:  LIPITOR Take 1 tablet (80 mg total) by mouth daily at 6 PM.    cyanocobalamin 1000 MCG tablet Take 1 tablet (1,000 mcg total) by mouth daily. Start taking on:  07/13/2016   famotidine 20 MG tablet Commonly known as:  PEPCID Take 1 tablet (20 mg total) by mouth 2 (two) times daily.   feeding supplement (ENSURE ENLIVE) Liqd Take 237 mLs by mouth 2 (two) times daily between meals.   lisinopril 5 MG tablet Commonly known as:  PRINIVIL,ZESTRIL Take 1 tablet (5 mg total) by mouth daily.   metoprolol succinate 25 MG 24 hr tablet Commonly known as:  TOPROL-XL Take 1 tablet (25 mg total) by mouth daily.   nicotine 21 mg/24hr patch Commonly known as:  NICODERM CQ - dosed in mg/24 hours Place 1 patch (21 mg total) onto the skin daily.   nitroGLYCERIN 0.4 MG SL tablet Commonly known as:  NITROSTAT Place 1 tablet (0.4 mg total) under the tongue every 5 (five) minutes x 3 doses as needed for chest pain.   ticagrelor 90 MG Tabs tablet Commonly known as:  BRILINTA Take 1 tablet (90 mg total) by mouth 2 (two) times daily.   traZODone 100 MG tablet Commonly known as:  DESYREL Take 1 tablet (100 mg total) by mouth at bedtime as needed for sleep.      Follow-up Information    Massie Maroon, FNP. Schedule an appointment as soon as possible for a visit in 1 week(s).   Specialty:  Family Medicine Contact information: [address] [place] [address] [phone]        Lennette Bihari, MD. Schedule an appointment as soon as possible for a visit in 3 week(s).   Specialty:  Cardiology Contact information: [address] [place] [address] [phone]          Allergies  Allergen Reactions  . Iodine Anaphylaxis and Swelling  . Shellfish Allergy Anaphylaxis    All shellfish  . Contrast Media [Iodinated Diagnostic Agents] Nausea And Vomiting      Procedures/Studies: Dg Chest 2 View  Result Date: 07/08/2016 CLINICAL DATA:  Chest pain EXAM: CHEST  2 VIEW COMPARISON:  11/19/2015 FINDINGS:  Hyperinflation. No focal pulmonary infiltrate, consolidation, or pleural effusion. Normal cardiomediastinal silhouette. No pneumothorax. IMPRESSION: No active cardiopulmonary disease. Electronically Signed   By: Jasmine Pang M.D.   On: 07/08/2016 18:25   [name] Contrast  Result Date: 07/09/2016 CLINICAL DATA:  41 y/o  M; left leg numbness. EXAM: MRI HEAD WITHOUT CONTRAST TECHNIQUE: Multiplanar, multiecho pulse sequences of the brain and surrounding structures were obtained without intravenous contrast. COMPARISON:  None. FINDINGS: Brain: No acute infarction, hemorrhage, hydrocephalus, extra-axial collection or mass lesion. Vascular: Normal flow voids. Skull and upper cervical spine: Normal marrow signal. Sinuses/Orbits: Negative. Other: None. IMPRESSION: Normal MRI of the brain. Electronically Signed   By: Mitzi Hansen M.D.   On: 07/09/2016 02:31    2d echo Study Conclusions  - Left ventricle: The cavity size was normal. Wall thickness was   normal. Systolic function was mildly to moderately reduced. The   estimated ejection fraction was in the range of 40% to 45%.   Diffuse hypokinesis. Left ventricular diastolic function   parameters were normal. - Aortic valve: Mildly  calcified annulus. Trileaflet. There was   trivial regurgitation. - Mitral valve: Mildly thickened leaflets . There was trivial   regurgitation. Valve area by pressure half-time: 1.52 cm^2. - Right atrium: Central venous pressure (est): 8 mm Hg. - Atrial septum: No defect or patent foramen ovale was identified. - Tricuspid valve: There was trivial regurgitation. - Pulmonary arteries: Systolic pressure could not be accurately   estimated. - Pericardium, extracardiac: There was no pericardial effusion.  Impressions:  - Normal LV wall thickness with LVEF 40-45% and diffuse   hypokinesis. Grossly normal diastolic function. Mildly thickened   mitral leaflets with trivial mitral regurgitation. Trivial  aortic   regurgitation. Trivial tricuspid regurgitation.    Subjective: Seen after returning from cath lab. Denies chest pain or dyspnea.  Discharge Exam: Vitals:   07/12/16 1230 07/12/16 1300  BP: 115/69 108/69  Pulse: (!) 56 (!) 56  Resp:    Temp:     Vitals:   07/12/16 1159 07/12/16 1200 07/12/16 1230 07/12/16 1300  BP: 122/80 119/77 115/69 108/69  Pulse: 63 64 (!) 56 (!) 56  Resp:      Temp:      TempSrc:      SpO2: 100%     Weight:      Height:        Gen: not in distress HEENT: Temporal wasting, moist mucosa, supple neck Chest: clear b/l, no added sounds CVS: N S1&S2, no murmurs, rubs or gallop GI: soft, NT, ND,  Musculoskeletal: warm, no edema, left radial cath site is clean, no bleeding CNS: Alert and oriented, normal strength in lower extremity,no numbness    The results of significant diagnostics from this hospitalization (including imaging, microbiology, ancillary and laboratory) are listed below for reference.     Microbiology: No results found for this or any previous visit (from the past 240 hour(s)).   Labs: BNP (last 3 results)  Recent Labs  07/08/16 2300  BNP 23.1   Basic Metabolic Panel:  Recent Labs Lab 07/08/16 1751 07/08/16 2015 07/10/16 0247 07/12/16 0635  NA 134*  --  137 137  K 5.9* 3.7 3.4* 4.5  CL 102  --  107 108  CO2 20*  --  23 21*  GLUCOSE 101*  --  80 102*  BUN 6  --  5* <5*  CREATININE 1.14  --  0.99 0.94  CALCIUM 9.4  --  8.5* 9.1   Liver Function Tests:  Recent Labs Lab 07/10/16 0247  AST 14*  ALT 14*  ALKPHOS 52  BILITOT 1.0  PROT 5.1*  ALBUMIN 3.0*    Recent Labs Lab 07/08/16 2259  LIPASE 25   No results for input(s): AMMONIA in the last 168 hours. CBC:  Recent Labs Lab 07/08/16 1751 07/09/16 0036 07/10/16 0247 07/11/16 0400 07/12/16 0635  WBC 4.6 4.7 3.9* 3.4* 3.5*  HGB 14.7 14.2 11.5* 11.8* 13.3  HCT 42.3 40.8 33.9* 35.0* 39.5  MCV 92.8 91.9 92.6 92.8 93.4  PLT 225 218 188 164  191   Cardiac Enzymes:  Recent Labs Lab 07/08/16 2259  TROPONINI <0.03   BNP: Invalid input(s): POCBNP CBG: No results for input(s): GLUCAP in the last 168 hours. D-Dimer No results for input(s): DDIMER in the last 72 hours. Hgb A1c No results for input(s): HGBA1C in the last 72 hours. Lipid Profile No results for input(s): CHOL, HDL, LDLCALC, TRIG, CHOLHDL, LDLDIRECT in the last 72 hours. Thyroid function studies No results for input(s): TSH, T4TOTAL, T3FREE, THYROIDAB in the last  72 hours.  Invalid input(s): FREET3 Anemia work up No results for input(s): VITAMINB12, FOLATE, FERRITIN, TIBC, IRON, RETICCTPCT in the last 72 hours. Urinalysis    Component Value Date/Time   COLORURINE AMBER (A) 11/08/2015 1529   APPEARANCEUR CLEAR 11/08/2015 1529   LABSPEC 1.042 (H) 11/08/2015 1529   PHURINE 6.0 11/08/2015 1529   GLUCOSEU NEGATIVE 11/08/2015 1529   HGBUR NEGATIVE 11/08/2015 1529   HGBUR negative 01/27/2009 1039   BILIRUBINUR MODERATE (A) 11/08/2015 1529   KETONESUR 40 (A) 11/08/2015 1529   PROTEINUR 30 (A) 11/08/2015 1529   UROBILINOGEN 0.2 07/07/2015 1150   NITRITE NEGATIVE 11/08/2015 1529   LEUKOCYTESUR NEGATIVE 11/08/2015 1529   Sepsis Labs Invalid input(s): PROCALCITONIN,  WBC,  LACTICIDVEN Microbiology No results found for this or any previous visit (from the past 240 hour(s)).   Time coordinating discharge: Over 30 minutes  SIGNED:   Eddie North, MD  [facility] 07/12/2016, 2:41 PM Pager   If 7PM-7AM, please contact night-coverage [email or web address] Password [email or web address]

## 2016-07-12 NOTE — Interval H&P Note (Signed)
History and Physical Interval Note:  07/12/2016 9:10 AM  [name]  has presented today for surgery, with the diagnosis of unstable angina  The various methods of treatment have been discussed with the patient and family. After consideration of risks, benefits and other options for treatment, the patient has consented to  Procedure(s): Left Heart Cath and Coronary Angiography (N/A) as a surgical intervention .  The patient's history has been reviewed, patient examined, no change in status, stable for surgery.  I have reviewed the patient's chart and labs.  Questions were answered to the patient's satisfaction.   Cath Lab Visit (complete for each Cath Lab visit)  Clinical Evaluation Leading to the Procedure:   ACS: Yes.    Non-ACS:    Anginal Classification: CCS IV  Anti-ischemic medical therapy: No Therapy  Non-Invasive Test Results: No non-invasive testing performed  Prior CABG: No previous CABG       Theron AristaPeter [facility] L[facility] Dba Brandon Ambulatory Surgery CenterJordanMD,FACC 07/12/2016 9:10 AM

## 2016-07-12 NOTE — Progress Notes (Signed)
Site area: Right groin a 5 french arterial sheath was removed  Site Prior to Removal:  Level 0  Pressure Applied For 25  MINUTES     Bedrest Beginning at 1030a  Manual:   Yes  Patient Status During Pull:  stable  Post Pull Groin Site:  Level 0  Post Pull Instructions Given:  Yes.    Post Pull Pulses Present:  Yes.    Dressing Applied:  Yes.    Comments:  VS remain stable

## 2016-07-13 LAB — CBC WITH DIFFERENTIAL/PLATELET
BASOS ABS: 0 10*3/uL (ref 0.0–0.1)
Basophils Relative: 0 %
EOS PCT: 0 %
Eosinophils Absolute: 0 10*3/uL (ref 0.0–0.7)
HCT: 38.8 % — ABNORMAL LOW (ref 39.0–52.0)
Hemoglobin: 13.3 g/dL (ref 13.0–17.0)
LYMPHS PCT: 17 %
Lymphs Abs: 1.1 10*3/uL (ref 0.7–4.0)
MCH: 32 pg (ref 26.0–34.0)
MCHC: 34.3 g/dL (ref 30.0–36.0)
MCV: 93.5 fL (ref 78.0–100.0)
MONO ABS: 0.3 10*3/uL (ref 0.1–1.0)
MONOS PCT: 5 %
Neutro Abs: 5.1 10*3/uL (ref 1.7–7.7)
Neutrophils Relative %: 78 %
PLATELETS: 197 10*3/uL (ref 150–400)
RBC: 4.15 MIL/uL — ABNORMAL LOW (ref 4.22–5.81)
RDW: 16.3 % — AB (ref 11.5–15.5)
WBC: 6.5 10*3/uL (ref 4.0–10.5)

## 2016-07-13 LAB — BASIC METABOLIC PANEL
Anion gap: 10 (ref 5–15)
CALCIUM: 9.6 mg/dL (ref 8.9–10.3)
CO2: 23 mmol/L (ref 22–32)
CREATININE: 0.85 mg/dL (ref 0.61–1.24)
Chloride: 106 mmol/L (ref 101–111)
GFR calc Af Amer: >60 mL/min (ref >60)
GLUCOSE: 89 mg/dL (ref 65–99)
Potassium: 3.5 mmol/L (ref 3.5–5.1)
Sodium: 139 mmol/L (ref 135–145)

## 2016-07-13 LAB — ABO/RH: ABO/RH(D): A POS

## 2016-07-13 LAB — APTT: aPTT: 30 seconds (ref 24–36)

## 2016-07-13 LAB — TYPE AND SCREEN
ABO/RH(D): A POS
Antibody Screen: NEGATIVE

## 2016-07-13 LAB — PROTIME-INR
INR: 1.08
Prothrombin Time: 14.1 seconds (ref 11.4–15.2)

## 2016-07-13 MED ORDER — TRANEXAMIC ACID 1000 MG/10ML IV SOLN
500.0000 mg | Freq: Once | INTRAVENOUS | Status: AC
  Administered 2016-07-13: 500 mg via TOPICAL
  Filled 2016-07-13: qty 10

## 2016-07-13 MED FILL — Nitroglycerin IV Soln 100 MCG/ML in D5W: INTRA_ARTERIAL | Qty: 10 | Status: AC

## 2016-07-13 NOTE — ED Provider Notes (Signed)
TIME SEEN: 12:26 AM  By signing my name below, I, Diona Browner, attest that this documentation has been prepared under the direction and in the presence of Marcques Wrightsman, Layla Maw, DO. Electronically Signed: Diona Browner, ED Scribe. 07/13/16. 12:30 AM.  CHIEF COMPLAINT: Post-op problem  HPI: [name] is a 41 y.o. male with a PMHx of CAD, who presents to the Emergency Department with a chief complaint of a post-op problem. Pt had a heart catheterization today (07/12/16) with Dr. Swaziland and ~ 7:30 pm area started bleeding and hasn't stopped. It is a slow "ooze" with BRB. Associated sx include dizziness. Pt reports drinking ETOH tonight. Pt denies lightheadedness, CP, SOB and LOC.  He is on aspirin and Brilinta.  ROS: See HPI Constitutional: no fever  Eyes: no drainage  ENT: no runny nose   Cardiovascular:  no chest pain  Resp: no SOB  GI: no vomiting GU: no dysuria Integumentary: no rash  Allergy: no hives  Musculoskeletal: no leg swelling  Neurological: no slurred speech ROS otherwise negative  PAST MEDICAL HISTORY/PAST SURGICAL HISTORY:  Past Medical History:  Diagnosis Date  . Anxiety   . Bipolar 1 disorder (HCC)   . CAD S/P percutaneous coronary angioplasty    a. Inf STEMI s/p BMS to RCA 01/2007. b. NSTEMI 07/2013 s/p PTCA to RCA for ISR; c. Transient inferior ST elevation (peak Ti 0.25) 01/2014 s/p PTCA/DES to pRCA, PTCA/DES to dRCA, EF 50%; d. 12/2014 Inf STEMI: RCA patent prox stent, 124m/d (3.0x32 Synergy DES), EF 35-45; e. 05/2015 Inf STEMI/Cath: LM nl, LAD 10ost/m, D1 75, LCX nl, OM1 25, OM2 30, RCA patent stents, RPDA 30ost.  . Hx of medication noncompliance   . Hyperlipidemia   . Hypertensive heart disease   . Ischemic cardiomyopathy    a. EF 40% in 2011, 60% in 2012. b. EF 55% by cath 07/2013. c. EF 50% by cath 01/2014; d. 12/2014 EF 35-45% by LV gram; e. 05/2015 Echo: EF 50-55% inflat/inf AK, mild AI; f. 06/2015 cMRI: EF 49%, basal & mid inf full thickness scar,  subendocardial scar in antsept and antlat wall, correlating w/ D1 dzs.  Marland Kitchen NSVT (nonsustained ventricular tachycardia) (HCC)    a. Very brief run during 07/2013 admit for NSTEMI felt due to MI.  . Polysubstance abuse    a. h/o tobacco, marijuana and crack cocaine use. b. 07/2013: +UDS THC, neg for cocaine.  . Tobacco abuse     MEDICATIONS:  Prior to Admission medications   Medication Sig Start Date End Date Taking? Authorizing Provider  ARIPiprazole (ABILIFY) 5 MG tablet Take 1 tablet (5 mg total) by mouth 2 (two) times daily. 06/09/16  Yes Rise Mu, PA-C  aspirin 81 MG EC tablet Take 1 tablet (81 mg total) by mouth daily. 07/12/16  Yes Dhungel, Nishant, MD  atorvastatin (LIPITOR) 80 MG tablet Take 1 tablet (80 mg total) by mouth daily at 6 PM. 07/12/16  Yes Dhungel, Nishant, MD  famotidine (PEPCID) 20 MG tablet Take 1 tablet (20 mg total) by mouth 2 (two) times daily. 03/15/16  Yes Charm Rings, NP  feeding supplement, ENSURE ENLIVE, (ENSURE ENLIVE) LIQD Take 237 mLs by mouth 2 (two) times daily between meals. 07/12/16  Yes Dhungel, Nishant, MD  lisinopril (PRINIVIL,ZESTRIL) 5 MG tablet Take 1 tablet (5 mg total) by mouth daily. 07/12/16  Yes Dhungel, Nishant, MD  metoprolol succinate (TOPROL-XL) 25 MG 24 hr tablet Take 1 tablet (25 mg total) by mouth daily. 07/12/16  Yes Dhungel,  Nishant, MD  nicotine (NICODERM CQ - DOSED IN MG/24 HOURS) 21 mg/24hr patch Place 1 patch (21 mg total) onto the skin daily. 07/12/16 07/12/17 Yes Dhungel, Nishant, MD  nitroGLYCERIN (NITROSTAT) 0.4 MG SL tablet Place 1 tablet (0.4 mg total) under the tongue every 5 (five) minutes x 3 doses as needed for chest pain. 07/12/16  Yes Dhungel, Nishant, MD  ticagrelor (BRILINTA) 90 MG TABS tablet Take 1 tablet (90 mg total) by mouth 2 (two) times daily. 07/12/16  Yes Dhungel, Nishant, MD  traZODone (DESYREL) 100 MG tablet Take 1 tablet (100 mg total) by mouth at bedtime as needed for sleep. 03/15/16  Yes Charm RingsLord, Jamison Y, NP  vitamin  B-12 1000 MCG tablet Take 1 tablet (1,000 mcg total) by mouth daily. 07/13/16  Yes Dhungel, Theda BelfastNishant, MD    ALLERGIES:  Allergies  Allergen Reactions  . Iodine Anaphylaxis and Swelling  . Shellfish Allergy Anaphylaxis    All shellfish  . Contrast Media [Iodinated Diagnostic Agents] Nausea And Vomiting    SOCIAL HISTORY:  Social History  Substance Use Topics  . Smoking status: Current Every Day Smoker    Packs/day: 0.25    Years: 30.00    Types: Cigarettes  . Smokeless tobacco: Never Used  . Alcohol use 0.0 oz/week     Comment: 80 oz of beer weekly    FAMILY HISTORY: Family History  Problem Relation Age of Onset  . CAD Mother   . Heart disease Mother   . Heart attack Mother   . Schizophrenia Mother   . CAD Sister     EXAM: BP 108/81 (BP Location: Right Arm)   Pulse 60   Temp 97.7 F (36.5 C) (Oral)   Resp 16   SpO2 100%   CONSTITUTIONAL: Alert and oriented and responds appropriately to questions. Well-appearing; well-nourished HEAD: Normocephalic EYES: Conjunctivae clear, pupils appear equal, EOMI ENT: normal nose; moist mucous membranes NECK: Supple, no meningismus, no nuchal rigidity, no LAD  CARD: RRR; S1 and S2 appreciated; no murmurs, no clicks, no rubs, no gallops RESP: Normal chest excursion without splinting or tachypnea; breath sounds clear and equal bilaterally; no wheezes, no rhonchi, no rales, no hypoxia or respiratory distress, speaking full sentences ABD/GI: Normal bowel sounds; non-distended; soft, non-tender, no rebound, no guarding, no peritoneal signs, no hepatosplenomegaly BACK:  The back appears normal and is non-tender to palpation, there is no CVA tenderness EXT: Normal ROM in all joints; non-tender to palpation; no edema; normal capillary refill; no cyanosis, no calf tenderness or swelling; punctate wound over right femoral artery slowing oozing BRB; strong 2+ femoral pulse; strong2+ DP pulse; no tenderness or swelling appreciated; or no hematoma  over right leg. No swelling throughout right leg. Sensation of right leg intact. Compartment throughout the right leg is soft.   SKIN: Normal color for age and race; warm; no rash NEURO: Moves all extremities equally PSYCH: The patient's mood and manner are appropriate. Grooming and personal hygiene are appropriate.  MEDICAL DECISION MAKING: Patient here with bleeding from his cardiac catheterization site. There is nothing on exam to suggest superimposed infection, hematoma, pseudoaneurysm. Extremities warm and well-perfused with normal sensation. He does report feeling dizzy but does not appear to be hemorrhaging at this time. No chest pain or shortness of breath. Will obtain labs. He is hemodynamically stable. Will apply pressure dressing, quick clot to this area.  ED PROGRESS: No significant improvement after quick clot after 30 minutes. Will apply a gauze soaked TXA to the wound.  2:24 AM  Pt still bleeding despite 30 minutes of quick clot and 30-45 minutes of TXA with pressure bandage. Labs pending.   3:45 AM  Pt's bleeding has completely stopped after another hour of TXA and quick clot at the same time. We have used multiple saline fluid bags to add pressure to this area. His labs are unremarkable. He states he is still feeling well. Able to eat and drink normally. I feel he is safe for discharge with close outpatient follow-up. We did discuss at length bleeding return precautions. Patient is comfortable with this plan.    At this time, I do not feel there is any life-threatening condition present. I have reviewed and discussed all results (EKG, imaging, lab, urine as appropriate) and exam findings with patient/family. I have reviewed nursing notes and appropriate previous records.  I feel the patient is safe to be discharged home without further emergent workup and can continue workup as an outpatient as needed. Discussed usual and customary return precautions. Patient/family verbalize  understanding and are comfortable with this plan.  Outpatient follow-up has been provided if needed. All questions have been answered.   I personally performed the services described in this documentation, which was scribed in my presence. The recorded information has been reviewed and is accurate.      Bawi Lakins, Layla Maw, DO 07/13/16 [phone]

## 2016-07-13 NOTE — ED Notes (Signed)
Quick clot placed w/ pressure applied.

## 2016-07-13 NOTE — Discharge Instructions (Signed)
If you begin bleeding again, please apply direct pressure for 30 minutes without letting go. If this does not stop the bleeding, please call your cardiologist or return to the hospital. Please call your cardiologist for close follow-up.

## 2016-10-19 ENCOUNTER — Emergency Department (HOSPITAL_COMMUNITY)
Admission: EM | Admit: 2016-10-19 | Discharge: 2016-10-21 | Disposition: A | Discharge status: Other Acute Inpatient Hospital | Payer: Medicare Other | Attending: Emergency Medicine | Admitting: Emergency Medicine

## 2016-10-19 ENCOUNTER — Encounter (HOSPITAL_COMMUNITY): Payer: Self-pay

## 2016-10-19 ENCOUNTER — Emergency Department (HOSPITAL_COMMUNITY): Payer: Medicare Other

## 2016-10-19 DIAGNOSIS — E876 Hypokalemia: Secondary | ICD-10-CM | POA: Insufficient documentation

## 2016-10-19 DIAGNOSIS — Z9114 Patient's other noncompliance with medication regimen: Secondary | ICD-10-CM | POA: Diagnosis not present

## 2016-10-19 DIAGNOSIS — I5022 Chronic systolic (congestive) heart failure: Secondary | ICD-10-CM | POA: Diagnosis not present

## 2016-10-19 DIAGNOSIS — Z955 Presence of coronary angioplasty implant and graft: Secondary | ICD-10-CM | POA: Diagnosis not present

## 2016-10-19 DIAGNOSIS — R45851 Suicidal ideations: Secondary | ICD-10-CM | POA: Insufficient documentation

## 2016-10-19 DIAGNOSIS — F319 Bipolar disorder, unspecified: Secondary | ICD-10-CM | POA: Diagnosis not present

## 2016-10-19 DIAGNOSIS — R112 Nausea with vomiting, unspecified: Secondary | ICD-10-CM | POA: Diagnosis present

## 2016-10-19 DIAGNOSIS — F17210 Nicotine dependence, cigarettes, uncomplicated: Secondary | ICD-10-CM | POA: Insufficient documentation

## 2016-10-19 DIAGNOSIS — Z046 Encounter for general psychiatric examination, requested by authority: Secondary | ICD-10-CM | POA: Insufficient documentation

## 2016-10-19 DIAGNOSIS — K226 Gastro-esophageal laceration-hemorrhage syndrome: Secondary | ICD-10-CM | POA: Diagnosis not present

## 2016-10-19 DIAGNOSIS — I2510 Atherosclerotic heart disease of native coronary artery without angina pectoris: Secondary | ICD-10-CM | POA: Insufficient documentation

## 2016-10-19 DIAGNOSIS — I110 Hypertensive heart disease with heart failure: Secondary | ICD-10-CM | POA: Insufficient documentation

## 2016-10-19 LAB — COMPREHENSIVE METABOLIC PANEL
ALK PHOS: 70 U/L (ref 38–126)
ALT: 18 U/L (ref 17–63)
AST: 22 U/L (ref 15–41)
Albumin: 4.6 g/dL (ref 3.5–5.0)
Anion gap: 19 — ABNORMAL HIGH (ref 5–15)
BILIRUBIN TOTAL: 1.6 mg/dL — AB (ref 0.3–1.2)
BUN: 8 mg/dL (ref 6–20)
CO2: 29 mmol/L (ref 22–32)
CREATININE: 1.34 mg/dL — AB (ref 0.61–1.24)
Calcium: 10 mg/dL (ref 8.9–10.3)
Chloride: 84 mmol/L — ABNORMAL LOW (ref 101–111)
GFR calc Af Amer: >60 mL/min (ref >60)
Glucose, Bld: 103 mg/dL — ABNORMAL HIGH (ref 65–99)
Potassium: 2.9 mmol/L — ABNORMAL LOW (ref 3.5–5.1)
Sodium: 132 mmol/L — ABNORMAL LOW (ref 135–145)
TOTAL PROTEIN: 7.8 g/dL (ref 6.5–8.1)

## 2016-10-19 LAB — SALICYLATE LEVEL: Salicylate Lvl: <7 mg/dL (ref 2.8–30.0)

## 2016-10-19 LAB — CBC
HEMATOCRIT: 43.4 % (ref 39.0–52.0)
Hemoglobin: 15.3 g/dL (ref 13.0–17.0)
MCH: 31.7 pg (ref 26.0–34.0)
MCHC: 35.3 g/dL (ref 30.0–36.0)
MCV: 90 fL (ref 78.0–100.0)
PLATELETS: 188 10*3/uL (ref 150–400)
RBC: 4.82 MIL/uL (ref 4.22–5.81)
RDW: 14.5 % (ref 11.5–15.5)
WBC: 3.8 10*3/uL — AB (ref 4.0–10.5)

## 2016-10-19 LAB — ETHANOL

## 2016-10-19 LAB — MAGNESIUM: MAGNESIUM: 2 mg/dL (ref 1.7–2.4)

## 2016-10-19 LAB — ACETAMINOPHEN LEVEL: Acetaminophen (Tylenol), Serum: <10 ug/mL — ABNORMAL LOW (ref 10–30)

## 2016-10-19 LAB — TROPONIN I: Troponin I: <0.03 ng/mL (ref <0.03)

## 2016-10-19 MED ORDER — VITAMIN B-12 1000 MCG PO TABS
1000.0000 ug | ORAL_TABLET | Freq: Every day | ORAL | Status: DC
Start: 2016-10-20 — End: 2016-10-21
  Administered 2016-10-20: 1000 ug via ORAL
  Filled 2016-10-19 (×2): qty 1

## 2016-10-19 MED ORDER — ONDANSETRON HCL 4 MG/2ML IJ SOLN
4.0000 mg | Freq: Once | INTRAMUSCULAR | Status: AC
  Administered 2016-10-19: 4 mg via INTRAVENOUS
  Filled 2016-10-19: qty 2

## 2016-10-19 MED ORDER — NITROGLYCERIN 0.4 MG SL SUBL: 0.4000 mg | SUBLINGUAL_TABLET | SUBLINGUAL | Status: DC | PRN

## 2016-10-19 MED ORDER — POTASSIUM CHLORIDE CRYS ER 20 MEQ PO TBCR
40.0000 meq | EXTENDED_RELEASE_TABLET | Freq: Once | ORAL | Status: AC
  Administered 2016-10-19: 40 meq via ORAL
  Filled 2016-10-19: qty 2

## 2016-10-19 MED ORDER — POTASSIUM CHLORIDE 10 MEQ/100ML IV SOLN
10.0000 meq | Freq: Once | INTRAVENOUS | Status: AC
  Administered 2016-10-19: 10 meq via INTRAVENOUS
  Filled 2016-10-19: qty 100

## 2016-10-19 MED ORDER — POTASSIUM CHLORIDE CRYS ER 20 MEQ PO TBCR
20.0000 meq | EXTENDED_RELEASE_TABLET | Freq: Two times a day (BID) | ORAL | Status: DC
  Administered 2016-10-20 – 2016-10-21 (×2): 20 meq via ORAL
  Filled 2016-10-19 (×2): qty 1

## 2016-10-19 MED ORDER — TRAZODONE HCL 50 MG PO TABS
100.0000 mg | ORAL_TABLET | Freq: Every evening | ORAL | Status: DC | PRN
Start: 2016-10-19 — End: 2016-10-21
  Administered 2016-10-20: 100 mg via ORAL
  Filled 2016-10-19: qty 1

## 2016-10-19 MED ORDER — ASPIRIN EC 81 MG PO TBEC
81.0000 mg | DELAYED_RELEASE_TABLET | Freq: Every day | ORAL | Status: DC
  Administered 2016-10-20 – 2016-10-21 (×2): 81 mg via ORAL
  Filled 2016-10-19 (×2): qty 1

## 2016-10-19 MED ORDER — ARIPIPRAZOLE 10 MG PO TABS
5.0000 mg | ORAL_TABLET | Freq: Two times a day (BID) | ORAL | Status: DC
  Administered 2016-10-19 – 2016-10-21 (×4): 5 mg via ORAL
  Filled 2016-10-19 (×4): qty 1

## 2016-10-19 MED ORDER — LISINOPRIL 10 MG PO TABS
5.0000 mg | ORAL_TABLET | Freq: Every day | ORAL | Status: DC
  Administered 2016-10-20 – 2016-10-21 (×2): 5 mg via ORAL
  Filled 2016-10-19: qty 2
  Filled 2016-10-19: qty 1

## 2016-10-19 MED ORDER — ATORVASTATIN CALCIUM 80 MG PO TABS
80.0000 mg | ORAL_TABLET | Freq: Every day | ORAL | Status: DC
Start: 2016-10-20 — End: 2016-10-21
  Administered 2016-10-20: 80 mg via ORAL
  Filled 2016-10-19: qty 1

## 2016-10-19 MED ORDER — FAMOTIDINE 20 MG PO TABS
20.0000 mg | ORAL_TABLET | Freq: Two times a day (BID) | ORAL | Status: DC
Start: 2016-10-19 — End: 2016-10-21
  Administered 2016-10-19 – 2016-10-21 (×4): 20 mg via ORAL
  Filled 2016-10-19 (×5): qty 1

## 2016-10-19 MED ORDER — SODIUM CHLORIDE 0.9 % IV BOLUS (SEPSIS)
1000.0000 mL | Freq: Once | INTRAVENOUS | Status: AC
  Administered 2016-10-19: 1000 mL via INTRAVENOUS

## 2016-10-19 NOTE — ED Provider Notes (Addendum)
[facility]-EMERGENCY DEPT Provider Note   CSN: [id number] Arrival date & time: 10/19/16  1828     History   Chief Complaint Chief Complaint  Patient presents with  . Hematemesis  . Suicidal    HPI [name] is a 41 y.o. male.  HPI Patient presents with nausea and vomiting. Started throwing up around 4 days ago. Statesit thrown up some blood. States he has had some black stool with a 2. Vomiting started first on the hematemesis. States he does feel little weak all over. Has some dull chest pain. No real difficulty breathing. States he just feels worn out. Has been off his medicines for about a month. States he had to decide whether to pay bills or get his medicines. Denies substance abuse. States he is off his bipolar medicine and now is suicidal. States he has also somewhat homicidal towards his girlfriend but does not think it would ask you do anything to her. He says that it was more fantasy. States that he would hurt himself so he would not hurt her. States he has plans to stop him from a train. Denies hallucinations. Has dull upper abdominal pain. Rare alcohol use. Past Medical History:  Diagnosis Date  . Anxiety   . Bipolar 1 disorder (HCC)   . CAD S/P percutaneous coronary angioplasty    a. Inf STEMI s/p BMS to RCA 01/2007. b. NSTEMI 07/2013 s/p PTCA to RCA for ISR; c. Transient inferior ST elevation (peak Ti 0.25) 01/2014 s/p PTCA/DES to pRCA, PTCA/DES to dRCA, EF 50%; d. 12/2014 Inf STEMI: RCA patent prox stent, 116m/d (3.0x32 Synergy DES), EF 35-45; e. 05/2015 Inf STEMI/Cath: LM nl, LAD 10ost/m, D1 75, LCX nl, OM1 25, OM2 30, RCA patent stents, RPDA 30ost.  . Hx of medication noncompliance   . Hyperlipidemia   . Hypertensive heart disease   . Ischemic cardiomyopathy    a. EF 40% in 2011, 60% in 2012. b. EF 55% by cath 07/2013. c. EF 50% by cath 01/2014; d. 12/2014 EF 35-45% by LV gram; e. 05/2015 Echo: EF 50-55% inflat/inf AK, mild AI; f. 06/2015 cMRI: EF 49%, basal & mid inf full  thickness scar, subendocardial scar in antsept and antlat wall, correlating w/ D1 dzs.  Marland Kitchen NSVT (nonsustained ventricular tachycardia) (HCC)    a. Very brief run during 07/2013 admit for NSTEMI felt due to MI.  . Polysubstance abuse (HCC)    a. h/o tobacco, marijuana and crack cocaine use. b. 07/2013: +UDS THC, neg for cocaine.  . Tobacco abuse     Patient Active Problem List   Diagnosis Date Noted  . Unstable angina (HCC)   . Chronic systolic CHF (congestive heart failure) (HCC) 07/08/2016  . Left leg numbness   . Tetrahydrocannabinol (THC) use disorder, severe, dependence (HCC) 03/14/2016  . Malnutrition of moderate degree 09/28/2015  . Hyperlipidemia   . Schizoaffective disorder, bipolar type (HCC) 03/06/2015  . Nausea with vomiting 03/06/2015  . Hypertensive heart disease 01/11/2015  . ST elevation myocardial infarction (STEMI) of inferior wall (HCC) 01/10/2015  . ST elevation myocardial infarction (STEMI) of inferior wall, initial episode of care (HCC) 01/10/2015  . Medical non-compliance   . ST elevation myocardial infarction involving right coronary artery (HCC) 01/15/2014  . Hx of medication noncompliance   . NSVT (nonsustained ventricular tachycardia) (HCC)   . Ischemic cardiomyopathy   . Tobacco abuse   . Anxiety   . 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 DES PCI to RCA x 2 (3.0 mm  x 22 mm Resolute - distal & proximal RCA) 01/14/2014    Class: Present on Admission  . Non-STEMI (non-ST elevated myocardial infarction) (HCC) 07/22/2013  . Atypical chest pain 07/21/2013  . Bipolar 1 disorder, depressed (HCC) 07/21/2013  . HLD (hyperlipidemia) 04/16/2013  . Hyperlipidemia with target LDL less than 70 01/27/2009  . SUBSTANCE ABUSE 01/27/2009  . Depression 01/27/2009  . Essential hypertension 01/27/2009  . CAD S/P BMS PCI to RCA with PTCA for ISR --> followed by stent thrombosis - DES PCI 01/27/2009  . GASTRITIS 01/27/2009    Past Surgical History:  Procedure Laterality  Date  . CARDIAC CATHETERIZATION  01/16/2009   normal left main, Cfx with 2-OMs both w/minor irregularities, LAd with 20-30% mid region irregularities, ramus intermediate/optional diagonal with 60% osital narrowing, RCA with stent in distal portion w/20% prox in-stent stenosis (Dr. Mervyn Skeeters. [place])  . CARDIAC CATHETERIZATION  07/23/2013   two vessel obstructive CAD, occluded first diagonal, focal in-stent restenosis in distal RCA (Dr. Peter Swaziland)  . CARDIAC CATHETERIZATION N/A 01/10/2015   Procedure: Left Heart Cath and Coronary Angiography;  Surgeon: Marykay Lex, MD;  Location: [facility] INVASIVE CV LAB;  Service: Cardiovascular;  Laterality: N/A;  . CARDIAC CATHETERIZATION N/A 01/10/2015   Procedure: Coronary Stent Intervention;  Surgeon: Marykay Lex, MD;  Location: [facility] INVASIVE CV LAB;  Service: Cardiovascular;  Laterality: N/A;  . CARDIAC CATHETERIZATION N/A 06/10/2015   Procedure: Left Heart Cath and Coronary Angiography;  Surgeon: Peter M Swaziland, MD;  Location: [facility] INVASIVE CV LAB;  Service: Cardiovascular;  Laterality: N/A;  . CARDIAC CATHETERIZATION N/A 09/27/2015   Procedure: Left Heart Cath and Coronary Angiography;  Surgeon: Corky Crafts, MD;  Location: [facility] INVASIVE CV LAB;  Service: Cardiovascular;  Laterality: N/A;  . CARDIAC CATHETERIZATION N/A 09/27/2015   Procedure: Coronary Balloon Angioplasty;  Surgeon: Corky Crafts, MD;  Location: [facility] INVASIVE CV LAB;  Service: Cardiovascular;  Laterality: N/A;  . CORONARY ANGIOPLASTY WITH STENT PLACEMENT  02/05/2007   3.5x4mm Quantum non-DES to RCA (Dr. Nicki Guadalajara)  . FEMORAL ARTERY STENT    . LEFT AND RIGHT HEART CATHETERIZATION WITH CORONARY ANGIOGRAM N/A 01/14/2014   Procedure: LEFT AND RIGHT HEART CATHETERIZATION WITH CORONARY ANGIOGRAM;  Surgeon: Kathleene Hazel, MD;  Location: [facility] CATH LAB;  Service: Cardiovascular;  Laterality: N/A;  . LEFT HEART CATH AND CORONARY ANGIOGRAPHY N/A 07/12/2016   Procedure: Left Heart Cath and Coronary  Angiography;  Surgeon: Swaziland, Peter M, MD;  Location: [facility] INVASIVE CV LAB;  Service: Cardiovascular;  Laterality: N/A;  . LEFT HEART CATHETERIZATION WITH CORONARY ANGIOGRAM N/A 07/23/2013   Procedure: LEFT HEART CATHETERIZATION WITH CORONARY ANGIOGRAM;  Surgeon: Peter M Swaziland, MD;  Location: [facility] CATH LAB;  Service: Cardiovascular;  Laterality: N/A;       Home Medications    Prior to Admission medications   Medication Sig Start Date End Date Taking? Authorizing Provider  ARIPiprazole (ABILIFY) 5 MG tablet Take 1 tablet (5 mg total) by mouth 2 (two) times daily. Patient not taking: Reported on 10/19/2016 06/09/16   Rise Mu, PA-C  aspirin 81 MG EC tablet Take 1 tablet (81 mg total) by mouth daily. Patient not taking: Reported on 10/19/2016 07/12/16   Dhungel, Theda Belfast, MD  atorvastatin (LIPITOR) 80 MG tablet Take 1 tablet (80 mg total) by mouth daily at 6 PM. Patient not taking: Reported on 10/19/2016 07/12/16   Dhungel, Theda Belfast, MD  famotidine (PEPCID) 20 MG tablet Take 1 tablet (20 mg total) by mouth 2 (  two) times daily. Patient not taking: Reported on 10/19/2016 03/15/16   Charm Rings, NP  feeding supplement, ENSURE ENLIVE, (ENSURE ENLIVE) LIQD Take 237 mLs by mouth 2 (two) times daily between meals. Patient not taking: Reported on 10/19/2016 07/12/16   Dhungel, Theda Belfast, MD  lisinopril (PRINIVIL,ZESTRIL) 5 MG tablet Take 1 tablet (5 mg total) by mouth daily. Patient not taking: Reported on 10/19/2016 07/12/16   Dhungel, Theda Belfast, MD  metoprolol succinate (TOPROL-XL) 25 MG 24 hr tablet Take 1 tablet (25 mg total) by mouth daily. Patient not taking: Reported on 10/19/2016 07/12/16   Dhungel, Theda Belfast, MD  nicotine (NICODERM CQ - DOSED IN MG/24 HOURS) 21 mg/24hr patch Place 1 patch (21 mg total) onto the skin daily. Patient not taking: Reported on 10/19/2016 07/12/16 07/12/17  Dhungel, Theda Belfast, MD  nitroGLYCERIN (NITROSTAT) 0.4 MG SL tablet Place 1 tablet (0.4 mg total) under the tongue every 5 (five)  minutes x 3 doses as needed for chest pain. Patient not taking: Reported on 10/19/2016 07/12/16   Dhungel, Theda Belfast, MD  ticagrelor (BRILINTA) 90 MG TABS tablet Take 1 tablet (90 mg total) by mouth 2 (two) times daily. Patient not taking: Reported on 10/19/2016 07/12/16   Dhungel, Theda Belfast, MD  traZODone (DESYREL) 100 MG tablet Take 1 tablet (100 mg total) by mouth at bedtime as needed for sleep. Patient not taking: Reported on 10/19/2016 03/15/16   Charm Rings, NP  vitamin B-12 1000 MCG tablet Take 1 tablet (1,000 mcg total) by mouth daily. Patient not taking: Reported on 10/19/2016 07/13/16   Eddie North, MD    Family History Family History  Problem Relation Age of Onset  . CAD Mother   . Heart disease Mother   . Heart attack Mother   . Schizophrenia Mother   . CAD Sister     Social History Social History  Substance Use Topics  . Smoking status: Current Every Day Smoker    Packs/day: 0.25    Years: 30.00    Types: Cigarettes  . Smokeless tobacco: Never Used  . Alcohol use 0.0 oz/week     Comment: "rare"     Allergies   Iodine; Shellfish allergy; and Contrast media [iodinated diagnostic agents]   Review of Systems Review of Systems  Constitutional: Positive for appetite change. Negative for fever and unexpected weight change.  HENT: Negative for congestion.   Respiratory: Negative for shortness of breath.   Cardiovascular: Positive for chest pain.  Gastrointestinal: Positive for abdominal pain, nausea and vomiting.  Genitourinary: Negative for flank pain.  Musculoskeletal: Negative for gait problem.  Skin: Negative for rash.  Neurological: Positive for weakness. Negative for speech difficulty.  Hematological: Negative for adenopathy. Does not bruise/bleed easily.  Psychiatric/Behavioral: Positive for dysphoric mood and suicidal ideas.     Physical Exam Updated Vital Signs BP 134/82   Pulse (!) 54   Temp 98.6 F (37 C) (Oral)   Resp 20   Ht 5\' 11"  (1.803 m)    Wt 56.7 kg (125 lb)   SpO2 100%   BMI 17.43 kg/m   Physical Exam  Constitutional: He appears well-developed.  HENT:  Head: Atraumatic.  Eyes: Pupils are equal, round, and reactive to light.  Neck: Neck supple.  Cardiovascular: Normal rate.   Pulmonary/Chest: Effort normal.  Abdominal: There is tenderness.  Mild epigastric tenderness without rebound or guarding.  Musculoskeletal: He exhibits no tenderness.  Neurological: He is alert.  Skin: Capillary refill takes less than 2 seconds. No pallor.  Psychiatric: His behavior  is normal.     ED Treatments / Results  Labs (all labs ordered are listed, but only abnormal results are displayed) Labs Reviewed  COMPREHENSIVE METABOLIC PANEL - Abnormal; Notable for the following:       Result Value   Sodium 132 (*)    Potassium 2.9 (*)    Chloride 84 (*)    Glucose, Bld 103 (*)    Creatinine, Ser 1.34 (*)    Total Bilirubin 1.6 (*)    Anion gap 19 (*)    All other components within normal limits  ACETAMINOPHEN LEVEL - Abnormal; Notable for the following:    Acetaminophen (Tylenol), Serum <10 (*)    All other components within normal limits  CBC - Abnormal; Notable for the following:    WBC 3.8 (*)    All other components within normal limits  ETHANOL  SALICYLATE LEVEL  TROPONIN I  MAGNESIUM  RAPID URINE DRUG SCREEN, HOSP PERFORMED    EKG  EKG Interpretation  Date/Time:  Tuesday October 19 2016 20:15:59 EDT Ventricular Rate:  54 PR Interval:    QRS Duration: 125 QT Interval:  486 QTC Calculation: 461 R Axis:   62 Text Interpretation:  Sinus rhythm Biatrial enlargement IVCD, consider atypical RBBB Left ventricular hypertrophy Confirmed by Benjiman Core [phone]) on 10/19/2016 8:21:03 PM       Radiology Dg Chest 2 View  Result Date: 10/19/2016 CLINICAL DATA:  Hematemesis. EXAM: CHEST  2 VIEW COMPARISON:  Radiographs of July 08, 2016. FINDINGS: The heart size and mediastinal contours are within normal limits. Both  lungs are clear. No pneumothorax or pleural effusion is noted. The visualized skeletal structures are unremarkable. IMPRESSION: No active cardiopulmonary disease. Electronically Signed   By: Lupita Raider, M.D.   On: 10/19/2016 20:15    Procedures Procedures (including critical care time)  Medications Ordered in ED Medications  famotidine (PEPCID) tablet 20 mg (not administered)  traZODone (DESYREL) tablet 100 mg (not administered)  aspirin EC tablet 81 mg (not administered)  atorvastatin (LIPITOR) tablet 80 mg (not administered)  lisinopril (PRINIVIL,ZESTRIL) tablet 5 mg (not administered)  nitroGLYCERIN (NITROSTAT) SL tablet 0.4 mg (not administered)  vitamin B-12 (CYANOCOBALAMIN) tablet 1,000 mcg (not administered)  ARIPiprazole (ABILIFY) tablet 5 mg (not administered)  sodium chloride 0.9 % bolus 1,000 mL (0 mLs Intravenous Stopped 10/19/16 2206)  ondansetron (ZOFRAN) injection 4 mg (4 mg Intravenous Given 10/19/16 1955)  potassium chloride 10 mEq in 100 mL IVPB (10 mEq Intravenous New Bag/Given 10/19/16 2206)     Initial Impression / Assessment and Plan / ED Course  I have reviewed the triage vital signs and the nursing notes.  Pertinent labs & imaging results that were available during my care of the patient were reviewed by me and considered in my medical decision making (see chart for details).     Patient presents with nausea and vomiting. Has had some hypokalemia. Likely due to the vomiting. Has been off his medications. Likely has Mallory-Weiss tear with some hematemesis from it. Hemoglobin is reassuring. Chest x-ray reassuring. Feels better after some IV fluid and potassium. No further vomiting in the ER. Appears medically cleared. EKG shows LVH. Normal troponin. Noncompliant with MEDICINES. To be seen by TTS.  Brillinta not restarted due to vomiting up the blood. Will however likely need to be restarted later.  Final Clinical Impressions(s) / ED Diagnoses   Final  diagnoses:  Mallory-Weiss tear  Hypokalemia  Non-intractable vomiting with nausea, unspecified vomiting type  Bipolar 1 disorder (  HCC)  Suicidal ideation    New Prescriptions New Prescriptions   No medications on file     Benjiman Core, MD 10/19/16 1610    Benjiman Core, MD 10/19/16 [phone]

## 2016-10-19 NOTE — ED Notes (Signed)
Iv attempted by this RN, pt very hard stick. Phlebotomy notified of need for blood

## 2016-10-19 NOTE — ED Triage Notes (Signed)
Pt brought in by [facility] from home where pt reports he has had N/V since Friday and has been vomiting "chunks of blood and havent eaten or drank anything since Friday". Hx MI, stroke, and bipolar disorder. Pt reports he has been out of his medications.

## 2016-10-19 NOTE — ED Notes (Signed)
ED Provider at bedside. 

## 2016-10-19 NOTE — ED Notes (Signed)
Staffing called for sitter, sitter coming from another assignment

## 2016-10-20 DIAGNOSIS — K226 Gastro-esophageal laceration-hemorrhage syndrome: Secondary | ICD-10-CM | POA: Diagnosis not present

## 2016-10-20 LAB — I-STAT CHEM 8, ED
BUN: 6 mg/dL (ref 6–20)
CHLORIDE: 94 mmol/L — AB (ref 101–111)
Calcium, Ion: 1.16 mmol/L (ref 1.15–1.40)
Creatinine, Ser: 1.2 mg/dL (ref 0.61–1.24)
GLUCOSE: 89 mg/dL (ref 65–99)
HCT: 43 % (ref 39.0–52.0)
Hemoglobin: 14.6 g/dL (ref 13.0–17.0)
POTASSIUM: 3.5 mmol/L (ref 3.5–5.1)
SODIUM: 137 mmol/L (ref 135–145)
TCO2: 28 mmol/L (ref 22–32)

## 2016-10-20 LAB — RAPID URINE DRUG SCREEN, HOSP PERFORMED
Amphetamines: NOT DETECTED
BENZODIAZEPINES: NOT DETECTED
Barbiturates: NOT DETECTED
Cocaine: NOT DETECTED
OPIATES: NOT DETECTED
Tetrahydrocannabinol: POSITIVE — AB

## 2016-10-20 MED ORDER — ONDANSETRON HCL 4 MG PO TABS
4.0000 mg | ORAL_TABLET | Freq: Once | ORAL | Status: AC
  Administered 2016-10-20: 4 mg via ORAL
  Filled 2016-10-20: qty 1

## 2016-10-20 MED ORDER — PROMETHAZINE HCL 25 MG/ML IJ SOLN
25.0000 mg | Freq: Once | INTRAMUSCULAR | Status: AC
  Administered 2016-10-20: 25 mg via INTRAMUSCULAR
  Filled 2016-10-20: qty 1

## 2016-10-20 MED ORDER — ONDANSETRON 4 MG PO TBDP
4.0000 mg | ORAL_TABLET | Freq: Once | ORAL | Status: AC
  Administered 2016-10-20: 4 mg via ORAL
  Filled 2016-10-20: qty 1

## 2016-10-20 MED ORDER — NICOTINE 21 MG/24HR TD PT24
21.0000 mg | MEDICATED_PATCH | Freq: Once | TRANSDERMAL | Status: AC
  Administered 2016-10-20: 21 mg via TRANSDERMAL
  Filled 2016-10-20: qty 1

## 2016-10-20 NOTE — ED Notes (Addendum)
Pt asking for medication to help him sleep "because he has been sleeping all day". PRN trazodone  to be given.

## 2016-10-20 NOTE — ED Notes (Signed)
Lunch delivered. 

## 2016-10-20 NOTE — ED Notes (Signed)
Pt asking for something for nausea before breakfast comes up.  Dr. Bebe Shaggy consulted.

## 2016-10-20 NOTE — ED Notes (Signed)
Pt meets inpatient per [facility].  However, when they were assessing pt, he started vomiting.  He has not vomited since completing TTS.  They will leave the decision of where to place him up to the day team, pending him holding down food and fluids at breakfast.

## 2016-10-20 NOTE — Progress Notes (Addendum)
[facility] reviewing however, the patient continues to vomit today, and a small amount of emesis noted by the patient's nurse, Kermit Balo, RN (10/20/16 at 11:56am).   The patient is not medically cleared for inpatient psychiatric treatment at this time. [facility] will continue to follow for medical clearance.   Baldo Daub MSW, LCSWA CSW Disposition [phone]

## 2016-10-20 NOTE — ED Notes (Signed)
TTS in progress at this time.  

## 2016-10-20 NOTE — ED Notes (Signed)
Pt's belongings inventoried and placed in locker 6. Valuables placed with security and medications to pharmacy.

## 2016-10-20 NOTE — ED Notes (Signed)
Pt states hes still feeling nauseous, was able to keep down sprite. When asked how patient is feeling today in regards to suicidal or homicidal thoughts, pt stated "I dont have any thoughts as long as im in here, if I go back out there i'll have those thoughts, this is like a vacation for me I really needed this".

## 2016-10-20 NOTE — ED Notes (Signed)
Security at bedside to wand patient. 

## 2016-10-20 NOTE — ED Notes (Signed)
Pt given saltine crackers and sprite with his medicines for PO challenge. Lunch tray also ordered. [facility] called and asked if patient can keep medicines and food down, to have MD reassess patient with possible redraw in electrolytes lab work.

## 2016-10-20 NOTE — ED Notes (Addendum)
This RN contacted [facility] about plan of care/transfer. Due to repeated episodes of N/V today, pt will remain in the ED overnight. [facility] will keep checking chart for updates on n/v experienced by pt throughout night.

## 2016-10-20 NOTE — ED Notes (Addendum)
Pt actively vomiting. Sitter assisting. Small amount of emesis noted. EDP to be notified.

## 2016-10-20 NOTE — ED Notes (Signed)
Pt did not eat breakfast, requesting sprite. Pt given sprite. Requesting nicotine patch.
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
## 2016-10-21 ENCOUNTER — Encounter (HOSPITAL_COMMUNITY): Payer: Self-pay | Admitting: General Practice

## 2016-10-21 ENCOUNTER — Inpatient Hospital Stay (HOSPITAL_COMMUNITY)
Admission: AD | Admit: 2016-10-21 | Discharge: 2016-10-25 | DRG: 885 | Disposition: A | Discharge status: Home / Self Care | Payer: Medicare Other | Source: Intra-hospital | Attending: Psychiatry | Admitting: Psychiatry

## 2016-10-21 DIAGNOSIS — I10 Essential (primary) hypertension: Secondary | ICD-10-CM | POA: Diagnosis not present

## 2016-10-21 DIAGNOSIS — Z818 Family history of other mental and behavioral disorders: Secondary | ICD-10-CM

## 2016-10-21 DIAGNOSIS — Z91013 Allergy to seafood: Secondary | ICD-10-CM | POA: Diagnosis not present

## 2016-10-21 DIAGNOSIS — Z955 Presence of coronary angioplasty implant and graft: Secondary | ICD-10-CM | POA: Diagnosis not present

## 2016-10-21 DIAGNOSIS — I5022 Chronic systolic (congestive) heart failure: Secondary | ICD-10-CM | POA: Diagnosis present

## 2016-10-21 DIAGNOSIS — Z7982 Long term (current) use of aspirin: Secondary | ICD-10-CM

## 2016-10-21 DIAGNOSIS — I2510 Atherosclerotic heart disease of native coronary artery without angina pectoris: Secondary | ICD-10-CM | POA: Diagnosis present

## 2016-10-21 DIAGNOSIS — E785 Hyperlipidemia, unspecified: Secondary | ICD-10-CM | POA: Diagnosis present

## 2016-10-21 DIAGNOSIS — Z599 Problem related to housing and economic circumstances, unspecified: Secondary | ICD-10-CM

## 2016-10-21 DIAGNOSIS — I252 Old myocardial infarction: Secondary | ICD-10-CM | POA: Diagnosis not present

## 2016-10-21 DIAGNOSIS — I110 Hypertensive heart disease with heart failure: Secondary | ICD-10-CM | POA: Diagnosis present

## 2016-10-21 DIAGNOSIS — F3160 Bipolar disorder, current episode mixed, unspecified: Principal | ICD-10-CM | POA: Diagnosis present

## 2016-10-21 DIAGNOSIS — Z91041 Radiographic dye allergy status: Secondary | ICD-10-CM | POA: Diagnosis not present

## 2016-10-21 DIAGNOSIS — I255 Ischemic cardiomyopathy: Secondary | ICD-10-CM | POA: Diagnosis present

## 2016-10-21 DIAGNOSIS — X58XXXA Exposure to other specified factors, initial encounter: Secondary | ICD-10-CM | POA: Diagnosis present

## 2016-10-21 DIAGNOSIS — F17210 Nicotine dependence, cigarettes, uncomplicated: Secondary | ICD-10-CM | POA: Diagnosis present

## 2016-10-21 DIAGNOSIS — G4700 Insomnia, unspecified: Secondary | ICD-10-CM | POA: Diagnosis present

## 2016-10-21 DIAGNOSIS — K226 Gastro-esophageal laceration-hemorrhage syndrome: Secondary | ICD-10-CM | POA: Diagnosis not present

## 2016-10-21 DIAGNOSIS — Z79899 Other long term (current) drug therapy: Secondary | ICD-10-CM | POA: Diagnosis not present

## 2016-10-21 DIAGNOSIS — T43596A Underdosing of other antipsychotics and neuroleptics, initial encounter: Secondary | ICD-10-CM | POA: Diagnosis present

## 2016-10-21 DIAGNOSIS — F319 Bipolar disorder, unspecified: Secondary | ICD-10-CM | POA: Diagnosis present

## 2016-10-21 DIAGNOSIS — Z736 Limitation of activities due to disability: Secondary | ICD-10-CM | POA: Diagnosis not present

## 2016-10-21 DIAGNOSIS — R45850 Homicidal ideations: Secondary | ICD-10-CM | POA: Diagnosis not present

## 2016-10-21 DIAGNOSIS — E860 Dehydration: Secondary | ICD-10-CM | POA: Diagnosis present

## 2016-10-21 DIAGNOSIS — F419 Anxiety disorder, unspecified: Secondary | ICD-10-CM | POA: Diagnosis present

## 2016-10-21 DIAGNOSIS — Z91120 Patient's intentional underdosing of medication regimen due to financial hardship: Secondary | ICD-10-CM

## 2016-10-21 HISTORY — DX: Schizophrenia, unspecified: F20.9

## 2016-10-21 MED ORDER — MAGNESIUM HYDROXIDE 400 MG/5ML PO SUSP: 30.0000 mL | Freq: Every day | ORAL | Status: DC | PRN

## 2016-10-21 MED ORDER — NICOTINE 21 MG/24HR TD PT24
21.0000 mg | MEDICATED_PATCH | Freq: Every day | TRANSDERMAL | Status: DC
  Filled 2016-10-21 (×2): qty 1

## 2016-10-21 MED ORDER — LISINOPRIL 5 MG PO TABS
5.0000 mg | ORAL_TABLET | Freq: Every day | ORAL | Status: DC
  Administered 2016-10-21 – 2016-10-25 (×5): 5 mg via ORAL
  Filled 2016-10-21 (×7): qty 1

## 2016-10-21 MED ORDER — NICOTINE POLACRILEX 2 MG MT GUM
2.0000 mg | CHEWING_GUM | OROMUCOSAL | Status: DC | PRN
  Administered 2016-10-21 – 2016-10-25 (×11): 2 mg via ORAL

## 2016-10-21 MED ORDER — ACETAMINOPHEN 325 MG PO TABS: 650.0000 mg | ORAL_TABLET | Freq: Four times a day (QID) | ORAL | Status: DC | PRN

## 2016-10-21 MED ORDER — TRAZODONE HCL 100 MG PO TABS
100.0000 mg | ORAL_TABLET | Freq: Every evening | ORAL | Status: DC | PRN
  Administered 2016-10-21 – 2016-10-24 (×4): 100 mg via ORAL
  Filled 2016-10-21 (×4): qty 1

## 2016-10-21 MED ORDER — ALUM & MAG HYDROXIDE-SIMETH 200-200-20 MG/5ML PO SUSP: 30.0000 mL | ORAL | Status: DC | PRN

## 2016-10-21 MED ORDER — NITROGLYCERIN 0.4 MG SL SUBL: 0.4000 mg | SUBLINGUAL_TABLET | SUBLINGUAL | Status: DC | PRN

## 2016-10-21 NOTE — Tx Team (Signed)
Initial Treatment Plan 10/21/2016 2:44 PM CHIDUBEM CHAIRES [id number]    PATIENT STRESSORS: Financial difficulties Substance abuse Other: Conflict with girlfriend, no supports    PATIENT STRENGTHS: Active sense of humor General fund of knowledge   PATIENT IDENTIFIED PROBLEMS:                      DISCHARGE CRITERIA:  Improved stabilization in mood, thinking, and/or behavior Verbal commitment to aftercare and medication compliance  PRELIMINARY DISCHARGE PLAN: Attend 12-step recovery group Outpatient therapy  PATIENT/FAMILY INVOLVEMENT: This treatment plan has been presented to and reviewed with the patient, AMAY MIJANGOS.  The patient has been given the opportunity to ask questions and make suggestions.  Larina Earthly, RN 10/21/2016, 2:44 PM

## 2016-10-21 NOTE — Progress Notes (Signed)
Pt is new to the unit early this afternoon.  He reports that he is "doing great", and that he will probably be here for a couple of days.  He denies SI/HI/AVH.  He denies having any issues at this time.  He voices no needs or concerns.  He did request Trazodone for sleep and Nicotine gum for his nicotine cravings.  Those orders were obtained from the evening provider.  Pt has spent the evening in the dayroom watching TV and talking with peers.  Pt was encouraged to make his needs known to staff.  Pt voiced understanding.  Support and encouragement offered.  Discharge plans are in process.  Safety maintained with q15 minute checks.

## 2016-10-21 NOTE — Progress Notes (Signed)
Patient ID: [name], male   DOB: [birth year], 41 y.o.   MRN: [id number]  [name] is a 41 year old male admitted to [facility] for suicidal ideation and medication non-adherence. He reports that he has a medical history of stroke, MI's, 3 stents placed, HTN, hyperlipidemia, CAD, and Bipolar 1 Disorder. He currently denies SI, HI, and A/V hallucinations. He reports no pain at this time. He reports, "my girlfriend stresses me out." He reports that he drinks a 40 oz twice a week. He reports THC use, one blunt, daily. He reports that he was not taking his medications for about a month. He is cooperative during the admission process. He is taken to the cafeteria for lunch then arrives back on the unit and is oriented to the unit. He is anxious but pleasant during admission process. Q15 minute safety checks were initiated and maintained. Report given to [employer].

## 2016-10-21 NOTE — Progress Notes (Signed)
Patient ID: [name], male   DOB: 1975-09-12, 41 y.o.   MRN: [id number] PER STATE REGULATIONS 482.30  THIS CHART WAS REVIEWED FOR MEDICAL NECESSITY WITH RESPECT TO THE PATIENT'S ADMISSION/DURATION OF STAY.  NEXT REVIEW DATE: 10/25/16  Loura Halt, RN, BSN CASE MANAGER

## 2016-10-21 NOTE — ED Notes (Signed)
Report called  

## 2016-10-21 NOTE — ED Notes (Signed)
Pt denies SI/HI at this time, pt also denies hallucinations at this time. Pt has no complaints or requests at this time. Pt updated about plan of care.    

## 2016-10-21 NOTE — ED Notes (Signed)
No N/v through the night , no n/v this morning , ordered breakfast tray

## 2016-10-21 NOTE — Progress Notes (Addendum)
Per Berneice Heinrich , [facility], patient has been accepted to [facility], bed 303-2 ; Accepting provider is Donell Sievert; Attending provider is Dr. Jama Flavors.  Patient can arrive now, bed is ready.  Number for report is [phone].   Patient's RN and EDP notified.    Baldo Daub MSW, LCSWA CSW Disposition [phone]

## 2016-10-21 NOTE — ED Provider Notes (Addendum)
Patient stable throughout night per nursing. He is tolerating PO now w/o difficulty. Received fluids, potassium. Pt medically cleared for psych dispo. Continue PO potassium replacement, antiemetics PRN.   Pt accepted to [facility]. Repeat Chem 8 is WNL.   Shaune Pollack, MD 10/21/16 1025    Shaune Pollack, MD 10/21/16 1044

## 2016-10-22 DIAGNOSIS — I10 Essential (primary) hypertension: Secondary | ICD-10-CM

## 2016-10-22 DIAGNOSIS — Z736 Limitation of activities due to disability: Secondary | ICD-10-CM

## 2016-10-22 DIAGNOSIS — R45850 Homicidal ideations: Secondary | ICD-10-CM

## 2016-10-22 DIAGNOSIS — Z599 Problem related to housing and economic circumstances, unspecified: Secondary | ICD-10-CM

## 2016-10-22 DIAGNOSIS — Z818 Family history of other mental and behavioral disorders: Secondary | ICD-10-CM

## 2016-10-22 DIAGNOSIS — I2510 Atherosclerotic heart disease of native coronary artery without angina pectoris: Secondary | ICD-10-CM

## 2016-10-22 DIAGNOSIS — F17210 Nicotine dependence, cigarettes, uncomplicated: Secondary | ICD-10-CM

## 2016-10-22 DIAGNOSIS — F3160 Bipolar disorder, current episode mixed, unspecified: Secondary | ICD-10-CM

## 2016-10-22 MED ORDER — ARIPIPRAZOLE 15 MG PO TABS
15.0000 mg | ORAL_TABLET | Freq: Every day | ORAL | Status: DC
  Administered 2016-10-23 – 2016-10-25 (×3): 15 mg via ORAL
  Filled 2016-10-22 (×5): qty 1

## 2016-10-22 MED ORDER — ADULT MULTIVITAMIN W/MINERALS CH
1.0000 | ORAL_TABLET | Freq: Every day | ORAL | Status: DC
  Administered 2016-10-22: 1 via ORAL
  Filled 2016-10-22: qty 1

## 2016-10-22 MED ORDER — ARIPIPRAZOLE 10 MG PO TABS
10.0000 mg | ORAL_TABLET | Freq: Once | ORAL | Status: AC
Start: 2016-10-22 — End: 2016-10-22
  Administered 2016-10-22: 10 mg via ORAL
  Filled 2016-10-22 (×2): qty 1

## 2016-10-22 MED ORDER — ENSURE ENLIVE PO LIQD: 237.0000 mL | Freq: Three times a day (TID) | ORAL | Status: DC

## 2016-10-22 NOTE — Social Work (Signed)
Referred to [facility] Transitional Care Team, is [place] resident.  Jazlen Ogarro, LCSW Lead Clinical Social Worker Phone:  [phone]  

## 2016-10-22 NOTE — Progress Notes (Signed)
Recreation Therapy Notes Date: 10/22/16 Time: 0930 Location: 300 Hall Group Room  Group Topic: Stress Management  Goal Area(s) Addresses:  Patient will verbalize importance of using healthy stress management.  Patient will identify positive emotions associated with healthy stress management.   Intervention: Stress Management  Activity :  Meditation.  LRT introduced the concept of meditation to patients.  Patients were to listen and follow along as the meditation played to fully engage in the activity.  Education:  Stress Management, Discharge Planning.   Education Outcome: Acknowledges edcuation/In group clarification offered/Needs additional education  Clinical Observations/Feedback: Pt did not attend group.   Tremane Spurgeon, LRT/CTRS        Bo Teicher A 10/22/2016 11:55 AM 

## 2016-10-22 NOTE — Progress Notes (Signed)
NUTRITION ASSESSMENT  Pt identified as at risk on the Malnutrition Screen Tool  INTERVENTION: 1. Educated patient on the importance of nutrition and encouraged intake of food and beverages. 2. Discussed weight goals. 3. Supplements:  - will order Ensure Enlive TID, each supplement provides 350 kcal and 20 grams of protein. - will order daily multivitamin with minerals.    NUTRITION DIAGNOSIS: Unintentional weight loss related to sub-optimal intake as evidenced by pt report.   Goal: Pt to meet >/= 90% of their estimated nutrition needs.  Monitor:  PO intake  Assessment:  Pt admitted with reports of nausea and hematemesis. When he arrived to the ED on Tuesday, he reported not eating or drinking anything since Friday d/t N/V. He also reported SI with being off of medications for bipolar disorder x1 month.   Per review, pt's current weight is consistent with weight in July and weight yesterday was 10 lbs lower than admission weight two days prior. Unsure of accuracy of this.  Will order items as outlined above. Continue to encourage PO intakes of meals, supplements, and snacks.     41 y.o. male  Height: Ht Readings from Last 1 Encounters:  10/21/16  (1.803 m)    Weight: Wt Readings from Last 1 Encounters:  10/21/16 115 lb (52.2 kg)    Weight Hx: Wt Readings from Last 10 Encounters:  10/21/16 115 lb (52.2 kg)  10/19/16 125 lb (56.7 kg)  07/12/16 116 lb 14.4 oz (53 kg)  03/23/16 130 lb (59 kg)  11/25/15 136 lb (61.7 kg)  11/08/15 135 lb (61.2 kg)  10/07/15 138 lb (62.6 kg)  09/30/15 125 lb (56.7 kg)  07/07/15 133 lb (60.3 kg)  06/10/15 127 lb 10.3 oz (57.9 kg)    BMI:  Body mass index is 16.04 kg/m. Pt meets criteria for underweight based on current BMI.  Estimated Nutritional Needs: Kcal: 25-30 kcal/kg Protein: > 1 gram protein/kg Fluid: 1 ml/kcal  Diet Order: Diet Heart Room service appropriate? Yes; Fluid consistency: Thin Pt is also offered choice  of unit snacks mid-morning and mid-afternoon.  Pt is eating as desired.   Lab results and medications reviewed.     [name], MS, RD, LDN, [facility] Inpatient Clinical Dietitian Pager # [phone] After hours/weekend pager # [phone]

## 2016-10-22 NOTE — Progress Notes (Signed)
D.  Pt pleasant on approach, come complaints of muscle tightness in legs.  Pt was positive for evening wrap up group, observed interacting appropriately with peers on the unit.  Pt denies SI/HI/AVH at this time.  A.  Support and encouragement offered, medication given as ordered  R.  Pt remains safe on the unit, will continue to monitor.

## 2016-10-22 NOTE — Progress Notes (Signed)
D:  Patient's self inventory sheet, patient has fair sleep, sleep medication helpful.  Good appetite, high energy level, good concentration.  Patient denied depression, anxiety, hopeless.  Denied withdrawals.  Denied SI.  Denied physical problems.  Denied physical pian.  Goal is get medications filled, stay positive.  Plans to stop stressing about the little stuff, we are not in that much debt, relax.  Would love to discharge, misses boo, longest time they have been apart.  Has been stressed about financial problems. A:  Medications administered per MD orders.  Emotional support and encouragement given patient. R:  Denied SI and HI, contracts for safety.  Denied A/V hallucinations.  Safety maintained with 15 minute checks. Patient was moved to another room because his roommate keeps air conditioning and is too cold for his heart problems.
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
## 2016-10-22 NOTE — Tx Team (Signed)
Interdisciplinary Treatment and Diagnostic Plan Update  10/25/2016 Time of Session: 10:00 AM [name] MRN: [id number]  Principal Diagnosis: Bipolar I disorder, most recent episode mixed (HCC)  Secondary Diagnoses: Principal Problem:   Bipolar I disorder, most recent episode mixed (HCC) Active Problems:   Bipolar 1 disorder, depressed (HCC)   Current Medications:  Current Facility-Administered Medications  Medication Dose Route Frequency Provider Last Rate Last Dose  . acetaminophen (TYLENOL) tablet 650 mg  650 mg Oral Q6H PRN Rankin, Shuvon B, NP      . alum & mag hydroxide-simeth (MAALOX/MYLANTA) 200-200-20 MG/5ML suspension 30 mL  30 mL Oral Q4H PRN Rankin, Shuvon B, NP      . ARIPiprazole (ABILIFY) tablet 15 mg  15 mg Oral Daily Izediuno, Delight Ovens, MD   15 mg at 10/25/16 0750  . lisinopril (PRINIVIL,ZESTRIL) tablet 5 mg  5 mg Oral Daily Rankin, Shuvon B, NP   5 mg at 10/25/16 0750  . magnesium hydroxide (MILK OF MAGNESIA) suspension 30 mL  30 mL Oral Daily PRN Rankin, Shuvon B, NP      . nicotine polacrilex (NICORETTE) gum 2 mg  2 mg Oral PRN Nira Conn A, NP   2 mg at 10/25/16 0821  . nitroGLYCERIN (NITROSTAT) SL tablet 0.4 mg  0.4 mg Sublingual Q5 Min x 3 PRN Rankin, Shuvon B, NP      . ondansetron (ZOFRAN-ODT) disintegrating tablet 4 mg  4 mg Oral Q8H PRN Money, Gerlene Burdock, FNP   4 mg at 10/25/16 0450  . traZODone (DESYREL) tablet 100 mg  100 mg Oral QHS PRN Nira Conn A, NP   100 mg at 10/24/16 2121   PTA Medications: Prescriptions Prior to Admission  Medication Sig Dispense Refill Last Dose  . ARIPiprazole (ABILIFY) 5 MG tablet Take 1 tablet (5 mg total) by mouth 2 (two) times daily. (Patient not taking: Reported on 10/19/2016) 30 tablet 0 Not Taking at Unknown time  . aspirin 81 MG EC tablet Take 1 tablet (81 mg total) by mouth daily. (Patient not taking: Reported on 10/19/2016) 30 tablet 0 Not Taking at Unknown time  . atorvastatin (LIPITOR) 80 MG tablet Take 1  tablet (80 mg total) by mouth daily at 6 PM. (Patient not taking: Reported on 10/19/2016) 30 tablet 0 Not Taking at Unknown time  . famotidine (PEPCID) 20 MG tablet Take 1 tablet (20 mg total) by mouth 2 (two) times daily. (Patient not taking: Reported on 10/19/2016) 30 tablet 0 Not Taking at Unknown time  . feeding supplement, ENSURE ENLIVE, (ENSURE ENLIVE) LIQD Take 237 mLs by mouth 2 (two) times daily between meals. (Patient not taking: Reported on 10/19/2016) 237 mL 12 Not Taking at Unknown time  . lisinopril (PRINIVIL,ZESTRIL) 5 MG tablet Take 1 tablet (5 mg total) by mouth daily. (Patient not taking: Reported on 10/19/2016) 30 tablet 0 Not Taking at Unknown time  . metoprolol succinate (TOPROL-XL) 25 MG 24 hr tablet Take 1 tablet (25 mg total) by mouth daily. (Patient not taking: Reported on 10/19/2016) 30 tablet 0 Not Taking at Unknown time  . nicotine (NICODERM CQ - DOSED IN MG/24 HOURS) 21 mg/24hr patch Place 1 patch (21 mg total) onto the skin daily. (Patient not taking: Reported on 10/19/2016) 30 patch 1 Not Taking at Unknown time  . nitroGLYCERIN (NITROSTAT) 0.4 MG SL tablet Place 1 tablet (0.4 mg total) under the tongue every 5 (five) minutes x 3 doses as needed for chest pain. (Patient not taking: Reported on 10/19/2016)  25 tablet 0 Not Taking at Unknown time  . ticagrelor (BRILINTA) 90 MG TABS tablet Take 1 tablet (90 mg total) by mouth 2 (two) times daily. (Patient not taking: Reported on 10/19/2016) 60 tablet 0 Not Taking at Unknown time  . traZODone (DESYREL) 100 MG tablet Take 1 tablet (100 mg total) by mouth at bedtime as needed for sleep. (Patient not taking: Reported on 10/19/2016) 30 tablet 0 Not Taking at Unknown time  . vitamin B-12 1000 MCG tablet Take 1 tablet (1,000 mcg total) by mouth daily. (Patient not taking: Reported on 10/19/2016) 30 tablet 2 Not Taking at Unknown time    Patient Stressors: Financial difficulties Substance abuse Other: Conflict with girlfriend, no supports    Patient Strengths: Active sense of humor General fund of knowledge  Treatment Modalities: Medication Management, Group therapy, Case management,  1 to 1 session with clinician, Psychoeducation, Recreational therapy.   Physician Treatment Plan for Primary Diagnosis: Bipolar I disorder, most recent episode mixed (HCC) Long Term Goal(s): Improvement in symptoms so as ready for discharge Improvement in symptoms so as ready for discharge   Short Term Goals: Ability to identify changes in lifestyle to reduce recurrence of condition will improve Ability to verbalize feelings will improve Ability to disclose and discuss suicidal ideas Ability to demonstrate self-control will improve Ability to identify and develop effective coping behaviors will improve Ability to maintain clinical measurements within normal limits will improve Compliance with prescribed medications will improve Ability to identify triggers associated with substance abuse/mental health issues will improve Ability to identify changes in lifestyle to reduce recurrence of condition will improve Ability to verbalize feelings will improve Ability to disclose and discuss suicidal ideas Ability to demonstrate self-control will improve Ability to identify and develop effective coping behaviors will improve Ability to maintain clinical measurements within normal limits will improve Compliance with prescribed medications will improve Ability to identify triggers associated with substance abuse/mental health issues will improve  Medication Management: Evaluate patient's response, side effects, and tolerance of medication regimen.  Therapeutic Interventions: 1 to 1 sessions, Unit Group sessions and Medication administration.  Evaluation of Outcomes: Progressing  Physician Treatment Plan for Secondary Diagnosis: Principal Problem:   Bipolar I disorder, most recent episode mixed (HCC) Active Problems:   Bipolar 1 disorder,  depressed (HCC)  Long Term Goal(s): Improvement in symptoms so as ready for discharge Improvement in symptoms so as ready for discharge   Short Term Goals: Ability to identify changes in lifestyle to reduce recurrence of condition will improve Ability to verbalize feelings will improve Ability to disclose and discuss suicidal ideas Ability to demonstrate self-control will improve Ability to identify and develop effective coping behaviors will improve Ability to maintain clinical measurements within normal limits will improve Compliance with prescribed medications will improve Ability to identify triggers associated with substance abuse/mental health issues will improve Ability to identify changes in lifestyle to reduce recurrence of condition will improve Ability to verbalize feelings will improve Ability to disclose and discuss suicidal ideas Ability to demonstrate self-control will improve Ability to identify and develop effective coping behaviors will improve Ability to maintain clinical measurements within normal limits will improve Compliance with prescribed medications will improve Ability to identify triggers associated with substance abuse/mental health issues will improve     Medication Management: Evaluate patient's response, side effects, and tolerance of medication regimen.  Therapeutic Interventions: 1 to 1 sessions, Unit Group sessions and Medication administration.  Evaluation of Outcomes: Progressing   RN Treatment Plan for Primary  Diagnosis: Bipolar I disorder, most recent episode mixed (HCC) Long Term Goal(s): Knowledge of disease and therapeutic regimen to maintain health will improve  Short Term Goals: Ability to remain free from injury will improve, Ability to verbalize frustration and anger appropriately will improve, Ability to demonstrate self-control, Ability to verbalize feelings will improve, Ability to disclose and discuss suicidal ideas, Ability to  identify and develop effective coping behaviors will improve and Compliance with prescribed medications will improve  Medication Management: RN will administer medications as ordered by provider, will assess and evaluate patient's response and provide education to patient for prescribed medication. RN will report any adverse and/or side effects to prescribing provider.  Therapeutic Interventions: 1 on 1 counseling sessions, Psychoeducation, Medication administration, Evaluate responses to treatment, Monitor vital signs and CBGs as ordered, Perform/monitor CIWA, COWS, AIMS and Fall Risk screenings as ordered, Perform wound care treatments as ordered.  Evaluation of Outcomes: Progressing   LCSW Treatment Plan for Primary Diagnosis: Bipolar I disorder, most recent episode mixed (HCC) Long Term Goal(s): Safe transition to appropriate next level of care at discharge, Engage patient in therapeutic group addressing interpersonal concerns.  Short Term Goals: Engage patient in aftercare planning with referrals and resources, Increase emotional regulation, Identify triggers associated with mental health/substance abuse issues and Increase skills for wellness and recovery  Therapeutic Interventions: Assess for all discharge needs, 1 to 1 time with Social worker, Explore available resources and support systems, Assess for adequacy in community support network, Educate family and significant other(s) on suicide prevention, Complete Psychosocial Assessment, Interpersonal group therapy.  Evaluation of Outcomes: Progressing   Progress in Treatment: Attending groups: Yes. Participating in groups: Yes. Taking medication as prescribed: Yes.  MD assessing for appropriate medication regimen Toleration medication: Yes. Family/Significant other contact made: No, will contact:  fiancee if patient is able to provide contact information Patient understands diagnosis: Yes. Discussing patient identified problems/goals  with staff: Yes. Medical problems stabilized or resolved: Yes. Denies suicidal/homicidal ideation: Yes. however admitted w suicidal ideation and voicing threats towards partner, coping skills inadequate for community stressors Issues/concerns per patient self-inventory: No. Other: NA  New problem(s) identified: Yes, Describe:  needs referrals, cannot provide contact information for fiancee  New Short Term/Long Term Goal(s):  "deal with my stress better"; "regulate my medications and deal w my anxiety" "I would never hurt my girlfriend"  Discharge Plan or Barriers: return home, follow up outpatient; financial couseling information given  Reason for Continuation of Hospitalization: Anxiety Medication stabilization Suicidal ideation  Estimated Length of Stay: 3 - 5 days; 10/25/16  Attendees: Patient:  [name] 10/25/2016 9:12 AM  Physician: Cristino Martes MD 10/25/2016 9:12 AM  Nursing: Cherlynn June RN 10/25/2016 9:12 AM  RN Care Manager: 10/25/2016 9:12 AM  Social Worker: Governor Rooks LCSW 10/25/2016 9:12 AM  Recreational Therapist:  10/25/2016 9:12 AM  Other:  10/25/2016 9:12 AM  Other:  10/25/2016 9:12 AM  Other: 10/25/2016 9:12 AM    Scribe for Treatment Team: Sallee Lange, LCSW 10/25/2016 9:12 AM

## 2016-10-22 NOTE — Plan of Care (Signed)
Problem: Coping: Goal: Ability to cope will improve Outcome: Progressing Nurse discussed depression/anxiety/coping skills with patient.    
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
## 2016-10-22 NOTE — H&P (Signed)
Psychiatric Admission Assessment Adult  Patient Identification: [name] MRN:  [id number] Date of Evaluation:  10/22/2016 Chief Complaint:  BIPOLAR  Principal Diagnosis: Bipolar I disorder, most recent episode mixed (HCC) Diagnosis:   Patient Active Problem List   Diagnosis Date Noted  . Bipolar I disorder, most recent episode mixed (HCC) [F31.60] 10/22/2016  . Unstable angina (HCC) [I20.0]   . Chronic systolic CHF (congestive heart failure) (HCC) [I50.22] 07/08/2016  . Left leg numbness [R20.0]   . Tetrahydrocannabinol (THC) use disorder, severe, dependence (HCC) [F12.20] 03/14/2016  . Malnutrition of moderate degree [E44.0] 09/28/2015  . Hyperlipidemia [E78.5]   . Schizoaffective disorder, bipolar type (HCC) [F25.0] 03/06/2015  . Nausea with vomiting [R11.2] 03/06/2015  . Hypertensive heart disease [I11.9] 01/11/2015  . ST elevation myocardial infarction (STEMI) of inferior wall (HCC) [I21.19] 01/10/2015  . ST elevation myocardial infarction (STEMI) of inferior wall, initial episode of care (HCC) [I21.19] 01/10/2015  . Medical non-compliance [Z91.19]   . ST elevation myocardial infarction involving right coronary artery (HCC) [I21.11] 01/15/2014  . Hx of medication noncompliance [Z91.14]   . NSVT (nonsustained ventricular tachycardia) (HCC) [I47.2]   . Ischemic cardiomyopathy [I25.5]   . Tobacco abuse [Z72.0]   . Anxiety [F41.9]   . 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 DES PCI to RCA x 2 (3.0 mm x 22 mm Resolute - distal & proximal RCA) [Z95.5] 01/14/2014    Class: Present on Admission  . Non-STEMI (non-ST elevated myocardial infarction) (HCC) [I21.4] 07/22/2013  . Atypical chest pain [R07.89] 07/21/2013  . Bipolar 1 disorder, depressed (HCC) [F31.9] 07/21/2013  . HLD (hyperlipidemia) [E78.5] 04/16/2013  . Hyperlipidemia with target LDL less than 70 [E78.5] 01/27/2009  . SUBSTANCE ABUSE [F19.10] 01/27/2009  . Depression [F32.9] 01/27/2009  . Essential  hypertension [I10] 01/27/2009  . CAD S/P BMS PCI to RCA with PTCA for ISR --> followed by stent thrombosis - DES PCI [I25.10, Z98.61] 01/27/2009  . GASTRITIS [K29.70, K29.90] 01/27/2009   History of Present Illness:   41 y.o AAM, single, on SSID, lives with his girlfriend. Background history of Bipolar disorder. Presented to the ER via EMS. Reports feeling more depressed lately. Reports being off his medications for two months. Expressed homicidal thoughts towards his girlfriend.  Routine labs shows dehydration and electrolyte derangement.  UDS is postive for THC.  BAL<5 mg/dl.  At interview, patient tells me that he has been getting depressed because of financial strain. Says his girlfriend spends the money while he makes the money. Says he was just "freaking out" when he expressed homicidal thoughts towards her. Says it was a figure of speech that was mis represented. Says he was just upset at that time and did not carefully choose his words. Patient tells me that he has no intention of harming his girlfriend. Says he does not have any desire to harm anyone else. Not feeling suicidal and denies any thoughts of violence. Patient states that he ran out of Abilify about two months ago. Says his mood has been up and down.  Patient wants to get back on medication. Says he feels he would be alright once he is back on medications. No hallucination in any modality. No delusional preoccupation. No access to weapons.  Total Time spent with patient: 1 hour  Past Psychiatric History: History of SUD and Bipolar Disorder. Was admitted four years ago. Says since he got his disability, he has not followed regularly. Denies any past suicidal behavior. Denies any past history of violent behavior.  Is the patient at risk to self? No.  Has the patient been a risk to self in the past 6 months? No.  Has the patient been a risk to self within the distant past? No.  Is the patient a risk to others? No.  Has the patient  been a risk to others in the past 6 months? No.  Has the patient been a risk to others within the distant past? No.   Prior Inpatient Therapy:   Prior Outpatient Therapy:    Alcohol Screening: 1. How often do you have a drink containing alcohol?: 2 to 3 times a week 2. How many drinks containing alcohol do you have on a typical day when you are drinking?: 5 or 6 3. How often do you have six or more drinks on one occasion?: Weekly Preliminary Score: 5 4. How often during the last year have you found that you were not able to stop drinking once you had started?: Never 5. How often during the last year have you failed to do what was normally expected from you becasue of drinking?: Never 6. How often during the last year have you needed a first drink in the morning to get yourself going after a heavy drinking session?: Never 7. How often during the last year have you had a feeling of guilt of remorse after drinking?: Never 8. How often during the last year have you been unable to remember what happened the night before because you had been drinking?: Never 9. Have you or someone else been injured as a result of your drinking?: No 10. Has a relative or friend or a doctor or another health worker been concerned about your drinking or suggested you cut down?: No Alcohol Use Disorder Identification Test Final Score (AUDIT): 8 Brief Intervention: Yes Substance Abuse History in the last 12 months:  Yes.   Consequences of Substance Abuse: Unknown Previous Psychotropic Medications: Yes  Psychological Evaluations: Yes  Past Medical History:  Past Medical History:  Diagnosis Date  . Anxiety   . Bipolar 1 disorder (HCC)   . CAD S/P percutaneous coronary angioplasty    a. Inf STEMI s/p BMS to RCA 01/2007. b. NSTEMI 07/2013 s/p PTCA to RCA for ISR; c. Transient inferior ST elevation (peak Ti 0.25) 01/2014 s/p PTCA/DES to pRCA, PTCA/DES to dRCA, EF 50%; d. 12/2014 Inf STEMI: RCA patent prox stent, 181m/d  (3.0x32 Synergy DES), EF 35-45; e. 05/2015 Inf STEMI/Cath: LM nl, LAD 10ost/m, D1 75, LCX nl, OM1 25, OM2 30, RCA patent stents, RPDA 30ost.  . Hx of medication noncompliance   . Hyperlipidemia   . Hypertensive heart disease   . Ischemic cardiomyopathy    a. EF 40% in 2011, 60% in 2012. b. EF 55% by cath 07/2013. c. EF 50% by cath 01/2014; d. 12/2014 EF 35-45% by LV gram; e. 05/2015 Echo: EF 50-55% inflat/inf AK, mild AI; f. 06/2015 cMRI: EF 49%, basal & mid inf full thickness scar, subendocardial scar in antsept and antlat wall, correlating w/ D1 dzs.  . Myocardial infarction [facility])    pt report   . NSVT (nonsustained ventricular tachycardia) (HCC)    a. Very brief run during 07/2013 admit for NSTEMI felt due to MI.  . Polysubstance abuse (HCC)    a. h/o tobacco, marijuana and crack cocaine use. b. 07/2013: +UDS THC, neg for cocaine.  . Schizophrenia (HCC)    pt report   . Tobacco abuse     Past Surgical History:  Procedure  Laterality Date  . CARDIAC CATHETERIZATION  01/16/2009   normal left main, Cfx with 2-OMs both w/minor irregularities, LAd with 20-30% mid region irregularities, ramus intermediate/optional diagonal with 60% osital narrowing, RCA with stent in distal portion w/20% prox in-stent stenosis (Dr. Mervyn Skeeters. [facility])  . CARDIAC CATHETERIZATION  07/23/2013   two vessel obstructive CAD, occluded first diagonal, focal in-stent restenosis in distal RCA (Dr. Peter Swaziland)  . CARDIAC CATHETERIZATION N/A 01/10/2015   Procedure: Left Heart Cath and Coronary Angiography;  Surgeon: Marykay Lex, MD;  Location[facility] INVASIVE CV LAB;  Service: Cardiovascular;  Laterality: N/A;  . CARDIAC CATHETERIZATION N/A 01/10/2015   Procedure: Coronary Stent Intervention;  Surgeon: Marykay Lex, MD;  Location: [facility] INVASIVE CV LAB;  Service: Cardiovascular;  Laterality: N/A;  . CARDIAC CATHETERIZATION N/A 06/10/2015   Procedure: Left Heart Cath and Coronary Angiography;  Surgeon: Peter M Swaziland, MD;  Location: [facility]  INVASIVE CV LAB;  Service: Cardiovascular;  Laterality: N/A;  . CARDIAC CATHETERIZATION N/A 09/27/2015   Procedure: Left Heart Cath and Coronary Angiography;  Surgeon: Corky Crafts, MD;  Location: [facility] INVASIVE CV LAB;  Service: Cardiovascular;  Laterality: N/A;  . CARDIAC CATHETERIZATION N/A 09/27/2015   Procedure: Coronary Balloon Angioplasty;  Surgeon: Corky Crafts, MD;  Location: [facility] INVASIVE CV LAB;  Service: Cardiovascular;  Laterality: N/A;  . CORONARY ANGIOPLASTY WITH STENT PLACEMENT  02/05/2007   3.5x5mm Quantum non-DES to RCA (Dr. Nicki Guadalajara)  . FEMORAL ARTERY STENT    . LEFT AND RIGHT HEART CATHETERIZATION WITH CORONARY ANGIOGRAM N/A 01/14/2014   Procedure: LEFT AND RIGHT HEART CATHETERIZATION WITH CORONARY ANGIOGRAM;  Surgeon: Kathleene Hazel, MD;  Location: [facility] CATH LAB;  Service: Cardiovascular;  Laterality: N/A;  . LEFT HEART CATH AND CORONARY ANGIOGRAPHY N/A 07/12/2016   Procedure: Left Heart Cath and Coronary Angiography;  Surgeon: Swaziland, Peter M, MD;  Location: [facility] INVASIVE CV LAB;  Service: Cardiovascular;  Laterality: N/A;  . LEFT HEART CATHETERIZATION WITH CORONARY ANGIOGRAM N/A 07/23/2013   Procedure: LEFT HEART CATHETERIZATION WITH CORONARY ANGIOGRAM;  Surgeon: Peter M Swaziland, MD;  Location: [facility] CATH LAB;  Service: Cardiovascular;  Laterality: N/A;   Family History:  Family History  Problem Relation Age of Onset  . CAD Mother   . Heart disease Mother   . Heart attack Mother   . Schizophrenia Mother   . CAD Sister    Family Psychiatric  History: Denies Tobacco Screening: Have you used any form of tobacco in the last 30 days? (Cigarettes, Smokeless Tobacco, Cigars, and/or Pipes): Yes Tobacco use, Select all that apply: 5 or more cigarettes per day Are you interested in Tobacco Cessation Medications?: Yes, will notify MD for an order Counseled patient on smoking cessation including recognizing danger situations, developing coping skills and basic information  about quitting provided: Refused/Declined practical counseling Social History:  History  Alcohol Use  . 0.0 oz/week    Comment: 1 40 oz 2 times a week "cause my gf does"     History  Drug Use  . Types: Marijuana    Comment: current user- daily one blunt     Additional Social History: Marital status: Long term relationship Long term relationship, how long?: since age 68, 21 years What types of issues is patient dealing with in the relationship?: miscommunication re how much financial responsibilty patient will take for girlfriends debts; cannot come to agreement on money management Are you sexually active?: Yes What is your sexual orientation?: heterosexual Has your sexual activity been affected  by drugs, alcohol, medication, or emotional stress?: no Does patient have children?: No       Allergies:   Allergies  Allergen Reactions  . Iodine Anaphylaxis and Swelling  . Shellfish Allergy Anaphylaxis    All shellfish  . Contrast Media [Iodinated Diagnostic Agents] Nausea And Vomiting   Lab Results:  Results for orders placed or performed during the hospital encounter of 10/19/16 (from the past 48 hour(s))  I-Stat Chem 8, ED     Status: Abnormal   Collection Time: 10/20/16  3:11 PM  Result Value Ref Range   Sodium 137 135 - 145 mmol/L   Potassium 3.5 3.5 - 5.1 mmol/L   Chloride 94 (L) 101 - 111 mmol/L   BUN 6 6 - 20 mg/dL   Creatinine, Ser 1.61 0.61 - 1.24 mg/dL   Glucose, Bld 89 65 - 99 mg/dL   Calcium, Ion 0.96 0.45 - 1.40 mmol/L   TCO2 28 22 - 32 mmol/L   Hemoglobin 14.6 13.0 - 17.0 g/dL   HCT 40.9 81.1 - 91.4 %    Blood Alcohol level:  Lab Results  Component Value Date   ETH <10 10/19/2016   ETH <5 03/13/2016    Metabolic Disorder Labs:  Lab Results  Component Value Date   HGBA1C 5.1 07/09/2016   MPG 100 07/09/2016   MPG 105 06/11/2015   Lab Results  Component Value Date   PROLACTIN 9.7 03/08/2015   Lab Results  Component Value Date   CHOL 262 (H)  07/09/2016   TRIG 66 07/09/2016   HDL 47 07/09/2016   CHOLHDL 5.6 07/09/2016   VLDL 13 07/09/2016   LDLCALC 202 (H) 07/09/2016   LDLCALC 161 (H) 06/11/2015    Current Medications: Current Facility-Administered Medications  Medication Dose Route Frequency Provider Last Rate Last Dose  . acetaminophen (TYLENOL) tablet 650 mg  650 mg Oral Q6H PRN Rankin, Shuvon B, NP      . alum & mag hydroxide-simeth (MAALOX/MYLANTA) 200-200-20 MG/5ML suspension 30 mL  30 mL Oral Q4H PRN Rankin, Shuvon B, NP      . ARIPiprazole (ABILIFY) tablet 10 mg  10 mg Oral Once Ayslin Kundert, Delight Ovens, MD      . Melene Muller ON 10/23/2016] ARIPiprazole (ABILIFY) tablet 15 mg  15 mg Oral Daily Catherine Oak A, MD      . lisinopril (PRINIVIL,ZESTRIL) tablet 5 mg  5 mg Oral Daily Rankin, Shuvon B, NP   5 mg at 10/22/16 0824  . magnesium hydroxide (MILK OF MAGNESIA) suspension 30 mL  30 mL Oral Daily PRN Rankin, Shuvon B, NP      . nicotine polacrilex (NICORETTE) gum 2 mg  2 mg Oral PRN Nira Conn A, NP   2 mg at 10/22/16 0826  . nitroGLYCERIN (NITROSTAT) SL tablet 0.4 mg  0.4 mg Sublingual Q5 Min x 3 PRN Rankin, Shuvon B, NP      . traZODone (DESYREL) tablet 100 mg  100 mg Oral QHS PRN Nira Conn A, NP   100 mg at 10/21/16 2157   PTA Medications: Prescriptions Prior to Admission  Medication Sig Dispense Refill Last Dose  . ARIPiprazole (ABILIFY) 5 MG tablet Take 1 tablet (5 mg total) by mouth 2 (two) times daily. (Patient not taking: Reported on 10/19/2016) 30 tablet 0 Not Taking at Unknown time  . aspirin 81 MG EC tablet Take 1 tablet (81 mg total) by mouth daily. (Patient not taking: Reported on 10/19/2016) 30 tablet 0 Not Taking at Unknown time  .  atorvastatin (LIPITOR) 80 MG tablet Take 1 tablet (80 mg total) by mouth daily at 6 PM. (Patient not taking: Reported on 10/19/2016) 30 tablet 0 Not Taking at Unknown time  . famotidine (PEPCID) 20 MG tablet Take 1 tablet (20 mg total) by mouth 2 (two) times daily. (Patient not  taking: Reported on 10/19/2016) 30 tablet 0 Not Taking at Unknown time  . feeding supplement, ENSURE ENLIVE, (ENSURE ENLIVE) LIQD Take 237 mLs by mouth 2 (two) times daily between meals. (Patient not taking: Reported on 10/19/2016) 237 mL 12 Not Taking at Unknown time  . lisinopril (PRINIVIL,ZESTRIL) 5 MG tablet Take 1 tablet (5 mg total) by mouth daily. (Patient not taking: Reported on 10/19/2016) 30 tablet 0 Not Taking at Unknown time  . metoprolol succinate (TOPROL-XL) 25 MG 24 hr tablet Take 1 tablet (25 mg total) by mouth daily. (Patient not taking: Reported on 10/19/2016) 30 tablet 0 Not Taking at Unknown time  . nicotine (NICODERM CQ - DOSED IN MG/24 HOURS) 21 mg/24hr patch Place 1 patch (21 mg total) onto the skin daily. (Patient not taking: Reported on 10/19/2016) 30 patch 1 Not Taking at Unknown time  . nitroGLYCERIN (NITROSTAT) 0.4 MG SL tablet Place 1 tablet (0.4 mg total) under the tongue every 5 (five) minutes x 3 doses as needed for chest pain. (Patient not taking: Reported on 10/19/2016) 25 tablet 0 Not Taking at Unknown time  . ticagrelor (BRILINTA) 90 MG TABS tablet Take 1 tablet (90 mg total) by mouth 2 (two) times daily. (Patient not taking: Reported on 10/19/2016) 60 tablet 0 Not Taking at Unknown time  . traZODone (DESYREL) 100 MG tablet Take 1 tablet (100 mg total) by mouth at bedtime as needed for sleep. (Patient not taking: Reported on 10/19/2016) 30 tablet 0 Not Taking at Unknown time  . vitamin B-12 1000 MCG tablet Take 1 tablet (1,000 mcg total) by mouth daily. (Patient not taking: Reported on 10/19/2016) 30 tablet 2 Not Taking at Unknown time    Musculoskeletal: Strength & Muscle Tone: within normal limits Gait & Station: normal Patient leans: N/A  Psychiatric Specialty Exam: Physical Exam  Constitutional: He is oriented to person, place, and time. He appears well-developed and well-nourished.  HENT:  Head: Normocephalic and atraumatic.  Respiratory: Effort normal.   Neurological: He is alert and oriented to person, place, and time.  Psychiatric:  As above    ROS  Blood pressure 113/82, pulse 85, temperature 98.5 F (36.9 C), temperature source Oral, resp. rate 18, height 5\' 11"  (1.803 m), weight 52.2 kg (115 lb), SpO2 100 %.Body mass index is 16.04 kg/m.  General Appearance:  Poor personal care, relates well. Calm and cooperative.   Eye Contact:  Good  Speech:  Clear and Coherent and Normal Rate  Volume:  Normal  Mood:  Not objectively depressed.   Affect:  Appropriate and Full Range  Thought Process:  Linear  Orientation:  Full (Time, Place, and Person)  Thought Content:  No violent theme. No preoccupation with revenge. No hallucination in any modality. No delusional theme.   Suicidal Thoughts:  No  Homicidal Thoughts:  No  Memory:  Immediate;   Good Recent;   Good Remote;   Good  Judgement:  Fair  Insight:  Good  Psychomotor Activity:  Normal  Concentration:  Concentration: Good and Attention Span: Good  Recall:  Good  Fund of Knowledge:  Good  Language:  Good  Akathisia:  Negative  Handed:    AIMS (if indicated):  Assets:  Communication Skills Desire for Improvement Housing Resilience Social Support Talents/Skills Transportation  ADL's:  Limited   Cognition:  WNL  Sleep:  Number of Hours: 5.75    Treatment Plan Summary: Patient is not pervasively depressed. He is not floridly manic or psychotic. He reports being upset due to his financial constraints. He has been off his mood stabilizer. We have agreed to restart his mood stabilizer and gather collateral from his girlfriend.   Psychiatric: Bipolar Disorder SUD  Medical: HTN CAD  Psychosocial:  Financial constraints  PLAN: 1. Abilify 10 mg today. Would titrate back to 15 mg from tomorrow.  2. Continue medical medications at home dose 3. Encourage unit groups and activities 4. Monitor mood, behavior and interaction with peers 5. SW would gather collateral from  his girlfriend.   Observation Level/Precautions:  15 minute checks  Laboratory:  Chemistry Profile  Psychotherapy:    Medications:    Consultations:    Discharge Concerns:    Estimated LOS:  Other:     Physician Treatment Plan for Primary Diagnosis: Bipolar I disorder, most recent episode mixed (HCC) Long Term Goal(s): Improvement in symptoms so as ready for discharge  Short Term Goals: Ability to identify changes in lifestyle to reduce recurrence of condition will improve, Ability to verbalize feelings will improve, Ability to disclose and discuss suicidal ideas, Ability to demonstrate self-control will improve, Ability to identify and develop effective coping behaviors will improve, Ability to maintain clinical measurements within normal limits will improve, Compliance with prescribed medications will improve and Ability to identify triggers associated with substance abuse/mental health issues will improve  Physician Treatment Plan for Secondary Diagnosis: Principal Problem:   Bipolar I disorder, most recent episode mixed (HCC) Active Problems:   Bipolar 1 disorder, depressed (HCC)  Long Term Goal(s): Improvement in symptoms so as ready for discharge  Short Term Goals: Ability to identify changes in lifestyle to reduce recurrence of condition will improve, Ability to verbalize feelings will improve, Ability to disclose and discuss suicidal ideas, Ability to demonstrate self-control will improve, Ability to identify and develop effective coping behaviors will improve, Ability to maintain clinical measurements within normal limits will improve, Compliance with prescribed medications will improve and Ability to identify triggers associated with substance abuse/mental health issues will improve  I certify that inpatient services furnished can reasonably be expected to improve the patient's condition.    Georgiann Cocker, MD 10/12/20181:41 PM

## 2016-10-22 NOTE — [facility] Group Notes (Signed)
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
## 2016-10-22 NOTE — B[facility] Group Notes (Signed)
B[facility] LCSW Group Therapy Note  10/22/2016 at 1:15 PM  Type of Therapy and Topic:  Group Therapy: Avoiding Old Behaviors and psycho education on Post Acute Withdrawal Syndrome  Participation Level:  Active  Affect:  Not Congruent   Therapeutic models used: Cognitive Behavioral Therapy,  Person-Centered Therapy and Motivational Interviewing  Modes of Intervention:  Discussion, Exploration, Orientation, Rapport Building, Socialization and Support   Summary of Progress/Problems: Group session included an educational portion on Post Acute Withdrawal Syndrome (PAWS).  Patients were able to process the information and share feelings related to length of time recovery takes.  Patient was in and out of group room and presented in anxious manner when advocating for self while presenting in irritable manner when question skill set of facilitator.Patient adamant that he does not need hospitalization.   Carney Bern, LCSW

## 2016-10-23 MED ORDER — ONDANSETRON 4 MG PO TBDP
4.0000 mg | ORAL_TABLET | Freq: Once | ORAL | Status: AC
  Administered 2016-10-23: 4 mg via ORAL
  Filled 2016-10-23 (×2): qty 1

## 2016-10-23 MED ORDER — ONDANSETRON 4 MG PO TBDP
4.0000 mg | ORAL_TABLET | Freq: Three times a day (TID) | ORAL | Status: DC | PRN
  Administered 2016-10-23 – 2016-10-25 (×4): 4 mg via ORAL
  Filled 2016-10-23 (×4): qty 1

## 2016-10-23 NOTE — Progress Notes (Signed)
D.  Pt came up to medication room early, has been suffering from nausea and vomiting most of day and wished to take night time medications early and sleep.  Pt did not feel well enough to attend evening wrap up group.  Pt denies SI/HI/AVH at this time.  A.  Support and encouragement offered, medication given as ordered.  R.  Pt remains safe on unit, presently in bed resting with eyes closed, even and unlabored respirations.

## 2016-10-23 NOTE — Progress Notes (Signed)
[facility] MD Progress Note  10/23/2016 12:31 PM [name]  MRN:  [id number] Subjective:   41 y.o AAM, single, on SSID, lives with his girlfriend. Background history of Bipolar disorder. Presented to the ER via EMS. Reports feeling more depressed lately. Reports being off his medications for two months. Expressed homicidal thoughts towards his girlfriend.  Routine labs shows dehydration and electrolyte derangement.  UDS is postive for THC.  BAL<5 mg/dl.  Chart reviewed today. Patient discussed at team today.  Nursing staff reports that patient has been appropriate on the unit. Patient has been interacting well with peers. No behavioral issues. Patient has not voiced any suicidal thoughts. Patient has not been observed to be internally stimulated. Patient has been adherent with treatment recommendations. Patient has been tolerating their medication well.   Seen today. In good spirits.  Reports normal energy. Says he slept well last night. No irritability. No racing thoughts. No thoughts of violence. No evidence of psychosis. Tolerating his medication well. Hopes to be discharged soon.   Principal Problem: Bipolar I disorder, most recent episode mixed (HCC) Diagnosis:   Patient Active Problem List   Diagnosis Date Noted  . Bipolar I disorder, most recent episode mixed (HCC) [F31.60] 10/22/2016  . Unstable angina (HCC) [I20.0]   . Chronic systolic CHF (congestive heart failure) (HCC) [I50.22] 07/08/2016  . Left leg numbness [R20.0]   . Tetrahydrocannabinol (THC) use disorder, severe, dependence (HCC) [F12.20] 03/14/2016  . Malnutrition of moderate degree [E44.0] 09/28/2015  . Hyperlipidemia [E78.5]   . Schizoaffective disorder, bipolar type (HCC) [F25.0] 03/06/2015  . Nausea with vomiting [R11.2] 03/06/2015  . Hypertensive heart disease [I11.9] 01/11/2015  . ST elevation myocardial infarction (STEMI) of inferior wall (HCC) [I21.19] 01/10/2015  . ST elevation myocardial infarction (STEMI) of  inferior wall, initial episode of care (HCC) [I21.19] 01/10/2015  . Medical non-compliance [Z91.19]   . ST elevation myocardial infarction involving right coronary artery (HCC) [I21.11] 01/15/2014  . Hx of medication noncompliance [Z91.14]   . NSVT (nonsustained ventricular tachycardia) (HCC) [I47.2]   . Ischemic cardiomyopathy [I25.5]   . Tobacco abuse [Z72.0]   . Anxiety [F41.9]   . 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 DES PCI to RCA x 2 (3.0 mm x 22 mm Resolute - distal & proximal RCA) [Z95.5] 01/14/2014    Class: Present on Admission  . Non-STEMI (non-ST elevated myocardial infarction) (HCC) [I21.4] 07/22/2013  . Atypical chest pain [R07.89] 07/21/2013  . Bipolar 1 disorder, depressed (HCC) [F31.9] 07/21/2013  . HLD (hyperlipidemia) [E78.5] 04/16/2013  . Hyperlipidemia with target LDL less than 70 [E78.5] 01/27/2009  . SUBSTANCE ABUSE [F19.10] 01/27/2009  . Depression [F32.9] 01/27/2009  . Essential hypertension [I10] 01/27/2009  . CAD S/P BMS PCI to RCA with PTCA for ISR --> followed by stent thrombosis - DES PCI [I25.10, Z98.61] 01/27/2009  . GASTRITIS [K29.70, K29.90] 01/27/2009   Total Time spent with patient: 20 minutes  Past Psychiatric History: As in H&P  Past Medical History:  Past Medical History:  Diagnosis Date  . Anxiety   . Bipolar 1 disorder (HCC)   . CAD S/P percutaneous coronary angioplasty    a. Inf STEMI s/p BMS to RCA 01/2007. b. NSTEMI 07/2013 s/p PTCA to RCA for ISR; c. Transient inferior ST elevation (peak Ti 0.25) 01/2014 s/p PTCA/DES to pRCA, PTCA/DES to dRCA, EF 50%; d. 12/2014 Inf STEMI: RCA patent prox stent, 141m/d (3.0x32 Synergy DES), EF 35-45; e. 05/2015 Inf STEMI/Cath: LM nl, LAD 10ost/m, D1 75, LCX nl,  OM1 25, OM2 30, RCA patent stents, RPDA 30ost.  . Hx of medication noncompliance   . Hyperlipidemia   . Hypertensive heart disease   . Ischemic cardiomyopathy    a. EF 40% in 2011, 60% in 2012. b. EF 55% by cath 07/2013. c. EF 50% by  cath 01/2014; d. 12/2014 EF 35-45% by LV gram; e. 05/2015 Echo: EF 50-55% inflat/inf AK, mild AI; f. 06/2015 cMRI: EF 49%, basal & mid inf full thickness scar, subendocardial scar in antsept and antlat wall, correlating w/ D1 dzs.  . Myocardial infarction [facility])    pt report   . NSVT (nonsustained ventricular tachycardia) (HCC)    a. Very brief run during 07/2013 admit for NSTEMI felt due to MI.  . Polysubstance abuse (HCC)    a. h/o tobacco, marijuana and crack cocaine use. b. 07/2013: +UDS THC, neg for cocaine.  . Schizophrenia (HCC)    pt report   . Tobacco abuse     Past Surgical History:  Procedure Laterality Date  . CARDIAC CATHETERIZATION  01/16/2009   normal left main, Cfx with 2-OMs both w/minor irregularities, LAd with 20-30% mid region irregularities, ramus intermediate/optional diagonal with 60% osital narrowing, RCA with stent in distal portion w/20% prox in-stent stenosis (Dr. Mervyn Skeeters. [place])  . CARDIAC CATHETERIZATION  07/23/2013   two vessel obstructive CAD, occluded first diagonal, focal in-stent restenosis in distal RCA (Dr. Peter Swaziland)  . CARDIAC CATHETERIZATION N/A 01/10/2015   Procedure: Left Heart Cath and Coronary Angiography;  Surgeon: Marykay Lex, MD;  Location: [facility] INVASIVE CV LAB;  Service: Cardiovascular;  Laterality: N/A;  . CARDIAC CATHETERIZATION N/A 01/10/2015   Procedure: Coronary Stent Intervention;  Surgeon: Marykay Lex, MD;  Location: [facility] INVASIVE CV LAB;  Service: Cardiovascular;  Laterality: N/A;  . CARDIAC CATHETERIZATION N/A 06/10/2015   Procedure: Left Heart Cath and Coronary Angiography;  Surgeon: Peter M Swaziland, MD;  Location: [facility] INVASIVE CV LAB;  Service: Cardiovascular;  Laterality: N/A;  . CARDIAC CATHETERIZATION N/A 09/27/2015   Procedure: Left Heart Cath and Coronary Angiography;  Surgeon: Corky Crafts, MD;  Location: [facility] INVASIVE CV LAB;  Service: Cardiovascular;  Laterality: N/A;  . CARDIAC CATHETERIZATION N/A 09/27/2015   Procedure: Coronary  Balloon Angioplasty;  Surgeon: Corky Crafts, MD;  Location: [facility] INVASIVE CV LAB;  Service: Cardiovascular;  Laterality: N/A;  . CORONARY ANGIOPLASTY WITH STENT PLACEMENT  02/05/2007   3.5x67mm Quantum non-DES to RCA (Dr. Nicki Guadalajara)  . FEMORAL ARTERY STENT    . LEFT AND RIGHT HEART CATHETERIZATION WITH CORONARY ANGIOGRAM N/A 01/14/2014   Procedure: LEFT AND RIGHT HEART CATHETERIZATION WITH CORONARY ANGIOGRAM;  Surgeon: Kathleene Hazel, MD;  Location: [facility] CATH LAB;  Service: Cardiovascular;  Laterality: N/A;  . LEFT HEART CATH AND CORONARY ANGIOGRAPHY N/A 07/12/2016   Procedure: Left Heart Cath and Coronary Angiography;  Surgeon: Swaziland, Peter M, MD;  Location: [facility] INVASIVE CV LAB;  Service: Cardiovascular;  Laterality: N/A;  . LEFT HEART CATHETERIZATION WITH CORONARY ANGIOGRAM N/A 07/23/2013   Procedure: LEFT HEART CATHETERIZATION WITH CORONARY ANGIOGRAM;  Surgeon: Peter M Swaziland, MD;  Location: [facility] CATH LAB;  Service: Cardiovascular;  Laterality: N/A;   Family History:  Family History  Problem Relation Age of Onset  . CAD Mother   . Heart disease Mother   . Heart attack Mother   . Schizophrenia Mother   . CAD Sister    Family Psychiatric  History: As in H&P Social History:  History  Alcohol Use  . 0.0  oz/week    Comment: 1 40 oz 2 times a week "cause my gf does"     History  Drug Use  . Types: Marijuana    Comment: current user- daily one blunt     Social History   Social History  . Marital status: Single    Spouse name: N/A  . Number of children: N/A  . Years of education: N/A   Social History Main Topics  . Smoking status: Current Every Day Smoker    Packs/day: 1.00    Years: 30.00    Types: Cigarettes  . Smokeless tobacco: Never Used  . Alcohol use 0.0 oz/week     Comment: 1 40 oz 2 times a week "cause my gf does"  . Drug use: Yes    Types: Marijuana     Comment: current user- daily one blunt   . Sexual activity: Not Currently   Other Topics Concern  .  None   Social History Narrative  . None   Additional Social History:        Sleep: Good  Appetite:  Good  Current Medications: Current Facility-Administered Medications  Medication Dose Route Frequency Provider Last Rate Last Dose  . acetaminophen (TYLENOL) tablet 650 mg  650 mg Oral Q6H PRN Rankin, Shuvon B, NP      . alum & mag hydroxide-simeth (MAALOX/MYLANTA) 200-200-20 MG/5ML suspension 30 mL  30 mL Oral Q4H PRN Rankin, Shuvon B, NP      . ARIPiprazole (ABILIFY) tablet 15 mg  15 mg Oral Daily Izediuno, Delight Ovens, MD   15 mg at 10/23/16 0813  . lisinopril (PRINIVIL,ZESTRIL) tablet 5 mg  5 mg Oral Daily Rankin, Shuvon B, NP   5 mg at 10/23/16 0812  . magnesium hydroxide (MILK OF MAGNESIA) suspension 30 mL  30 mL Oral Daily PRN Rankin, Shuvon B, NP      . nicotine polacrilex (NICORETTE) gum 2 mg  2 mg Oral PRN Nira Conn A, NP   2 mg at 10/23/16 0821  . nitroGLYCERIN (NITROSTAT) SL tablet 0.4 mg  0.4 mg Sublingual Q5 Min x 3 PRN Rankin, Shuvon B, NP      . ondansetron (ZOFRAN-ODT) disintegrating tablet 4 mg  4 mg Oral Q8H PRN Money, Gerlene Burdock, FNP   4 mg at 10/23/16 1056  . traZODone (DESYREL) tablet 100 mg  100 mg Oral QHS PRN Nira Conn A, NP   100 mg at 10/22/16 2148    Lab Results: No results found for this or any previous visit (from the past 48 hour(s)).  Blood Alcohol level:  Lab Results  Component Value Date   ETH <10 10/19/2016   ETH <5 03/13/2016    Metabolic Disorder Labs: Lab Results  Component Value Date   HGBA1C 5.1 07/09/2016   MPG 100 07/09/2016   MPG 105 06/11/2015   Lab Results  Component Value Date   PROLACTIN 9.7 03/08/2015   Lab Results  Component Value Date   CHOL 262 (H) 07/09/2016   TRIG 66 07/09/2016   HDL 47 07/09/2016   CHOLHDL 5.6 07/09/2016   VLDL 13 07/09/2016   LDLCALC 202 (H) 07/09/2016   LDLCALC 161 (H) 06/11/2015    Physical Findings: AIMS: Facial and Oral Movements Muscles of Facial Expression: None, normal Lips and  Perioral Area: None, normal Jaw: None, normal Tongue: None, normal,Extremity Movements Upper (arms, wrists, hands, fingers): None, normal Lower (legs, knees, ankles, toes): None, normal, Trunk Movements Neck, shoulders, hips: None, normal, Overall Severity Severity  of abnormal movements (highest score from questions above): None, normal Incapacitation due to abnormal movements: None, normal Patient's awareness of abnormal movements (rate only patient's report): No Awareness, Dental Status Current problems with teeth and/or dentures?: Yes Does patient usually wear dentures?: No  CIWA:  CIWA-Ar Total: 1 COWS:  COWS Total Score: 1  Musculoskeletal: Strength & Muscle Tone: within normal limits Gait & Station: normal Patient leans: N/A  Psychiatric Specialty Exam: Physical Exam  Constitutional: He is oriented to person, place, and time. He appears well-developed and well-nourished.  HENT:  Head: Normocephalic and atraumatic.  Respiratory: Effort normal.  Neurological: He is alert and oriented to person, place, and time.  Psychiatric:  As above    ROS  Blood pressure 127/83, pulse 97, temperature 98.2 F (36.8 C), temperature source Oral, resp. rate 16, height  (1.803 m), weight 52.2 kg (115 lb), SpO2 100 %.Body mass index is 16.04 kg/m.  General Appearance: In multiple layers of clothes, calm and cooperative. Relates well. Not internally distracted. No EPS  Eye Contact:  Good  Speech:  Clear and Coherent and Normal Rate  Volume:  Normal  Mood:  Euthymic  Affect:  Appropriate and Full Range  Thought Process:  Goal Directed  Orientation:  Full (Time, Place, and Person)  Thought Content:  No delusional theme. No preoccupation with violent thoughts. No negative ruminations. No obsession.  No hallucination in any modality.   Suicidal Thoughts:  No  Homicidal Thoughts:  No  Memory:  Immediate;   Good Recent;   Good Remote;   Good  Judgement:  Good  Insight:  Good   Psychomotor Activity:  Normal  Concentration:  Concentration: Good and Attention Span: Good  Recall:  Good  Fund of Knowledge:  Good  Language:  Good  Akathisia:  Negative  Handed:    AIMS (if indicated):     Assets:  Communication Skills Desire for Improvement Housing Intimacy Physical Health Resilience  ADL's:  Fair  Cognition:  WNL  Sleep:  Number of Hours: 6.75     Treatment Plan Summary: Patient is not depressed. He is not manic or psychotic. He continues to deny any intent to harm his girlfriend. We have recommenced him on his mood stabilizers. We have not yet gathered collateral from his girlfriend. Needs further evaluation.   Psychiatric: Bipolar Disorder SUD  Medical: HTN CAD  Psychosocial:  Financial constraints  PLAN: 1.  Increase Abilify to 15 mg daily 2. Continue to monitor mood, behavior and interaction with peers 3. SW would gather collateral from his girlfriend  Georgiann Cocker, MD 10/23/2016, 12:31 PM

## 2016-10-23 NOTE — BHH Group Notes (Signed)
B[facility] Group Notes: (Clinical Social Work)   10/23/2016      Type of Therapy:  Group Therapy   Participation Level:  Did Not Attend despite MHT prompting - arrived for a few minutes toward the end of group but did not feel well and left   Ambrose Mantle, LCSW 10/23/2016, 4:55 PM

## 2016-10-23 NOTE — Progress Notes (Signed)
Psychoeducational Group Note  Date:  10/23/2016 Time: 2045 Group Topic/Focus:  wrap up group  Participation Level: Did Not Attend  Participation Quality:  Not Applicable  Affect:  Not Applicable  Cognitive:  Not Applicable  Insight:  Not Applicable  Engagement in Group: Not Applicable  Additional Comments:  Pt was notified that group was beginning but remained in bed.   Marcille Buffy 10/23/2016, 11:45 PM

## 2016-10-23 NOTE — Progress Notes (Addendum)
Data. Patient denies SI/HI/AVH. Verbally contracts for safety on the unit and to come to staff before acting of any self harm thoughts/feelings.  Patient has spent most of the shift in his room this shift. Patient C/O N/V this AM stating, "I think I drank too much coffee." Patient did has observable emesis and MD notified and antiemetic ordered and administered. Patient repots decrease in vomiting for some time, but then he started vomiting again. One time dose of antiemetic ordered, between PRN times. Patient had no more episodes of emesis after the second dose on Zofran, though he continued to be nauseous. Patient was unable to keep foods, or fluids down. VS remained WDL.Marland Kitchen Other than the N/V, patient had no other symptoms. Action. Emotional support and encouragement offered. Education provided on medication, indications and side effect. Q 15 minute checks done for safety. Response. Safety on the unit maintained through 15 minute checks.  Medications taken as prescribed. Did not attended groups. Remained calm and appropriate through out shift.

## 2016-10-24 NOTE — [facility] Group Notes (Signed)
[facility] Group Notes:  (Nursing/MHT/Case Management/Adjunct)  Date:  10/24/2016  Time:  6:48 PM  Type of Therapy:  Nurse Education  Participation Level:  Active  Participation Quality:  Appropriate and Attentive  Affect:  Appropriate  Cognitive:  Appropriate  Insight:  Appropriate  Engagement in Group:  Engaged  Modes of Intervention:  Discussion and Education  Summary of Progress/Problems:  Discussed healthy coping skills and how to change negative coping skills.  Almira Bar 10/24/2016, 6:48 PM

## 2016-10-24 NOTE — [facility] Group Notes (Signed)
[facility] LCSW Group Therapy Note  Date/Time:    10/24/2016   1:15-2:15PM  Type of Therapy and Topic:  Group Therapy:  Healthy Self Image and Positive Change  Participation Level:  Did Not Attend   Description of Group:  In this group, patients compared and contrasted their current "I am...." statements to the visions they identified as desirable for their lives.  Patients discussed their tendency toward cognitive distortions, and how they can go about making positive changes in their cognitions that will positively impact their behaviors.  Many expressions of similarities and mutual support were provided among group members.  Facilitator played a motivational 3-minute speech and a discussion was held regarding reactions.  Patients were left with the task of thinking about what "I am...." statements they can start using in their lives immediately.  Therapeutic Goals: 1. Patient will state their current self-perception as expressed in an "I Am" statement 2. Patient will contrast this with their desired vision for their lives 3. Patient will discuss cognitive distortions and how these affect their ongoing "I Am" thoughts 4. Patient will verbalize statements that challenge their cognitive distortions  Summary of Patient Progress:  N.A   Therapeutic Modalities Cognitive Behavioral Therapy Motivational Interviewing  Larson Limones Grossman-Orr, LCSW 10/24/2016 8:16 AM   

## 2016-10-24 NOTE — Progress Notes (Signed)
[facility] MD Progress Note  10/24/2016 12:35 PM [name]  MRN:  [id number] Subjective:   41 y.o AAM, single, on SSID, lives with his girlfriend. Background history of Bipolar disorder. Presented to the ER via EMS. Reports feeling more depressed lately. Reports being off his medications for two months. Expressed homicidal thoughts towards his girlfriend.  Routine labs shows dehydration and electrolyte derangement.  UDS is postive for THC.  BAL<5 mg/dl.  Chart reviewed today. Patient discussed at team today.  Nursing staff reports that patient has been appropriate on the unit. Has not attended unit groups lately. Patient has been interacting well with peers. No behavioral issues. Patient has not voiced any suicidal thoughts. Patient has not been observed to be internally stimulated. Patient has been adherent with treatment recommendations. Patient has been tolerating their medication well.   Seen today. Doing well mentally. Tolerates his medication well. Says he has been sleeping well at night. He is able to think clearly. No racing thoughts. No hallucination in any modality. No delusional preoccupation. No thoughts of violence.  I spoke with his girlfriend Maureen Ralphs on [phone]. Reports that patient is not a threat to her or anyone. Says they get along well. Says he has never threatened her or abused her. does not feel any danger around him. Says she wants him home soon. Maureen Ralphs has been communicating with the patient while he has been here. She is pleased he is back on medications.    Principal Problem: Bipolar I disorder, most recent episode mixed (HCC) Diagnosis:   Patient Active Problem List   Diagnosis Date Noted  . Bipolar I disorder, most recent episode mixed (HCC) [F31.60] 10/22/2016  . Unstable angina (HCC) [I20.0]   . Chronic systolic CHF (congestive heart failure) (HCC) [I50.22] 07/08/2016  . Left leg numbness [R20.0]   . Tetrahydrocannabinol (THC) use disorder, severe, dependence  (HCC) [F12.20] 03/14/2016  . Malnutrition of moderate degree [E44.0] 09/28/2015  . Hyperlipidemia [E78.5]   . Schizoaffective disorder, bipolar type (HCC) [F25.0] 03/06/2015  . Nausea with vomiting [R11.2] 03/06/2015  . Hypertensive heart disease [I11.9] 01/11/2015  . ST elevation myocardial infarction (STEMI) of inferior wall (HCC) [I21.19] 01/10/2015  . ST elevation myocardial infarction (STEMI) of inferior wall, initial episode of care (HCC) [I21.19] 01/10/2015  . Medical non-compliance [Z91.19]   . ST elevation myocardial infarction involving right coronary artery (HCC) [I21.11] 01/15/2014  . Hx of medication noncompliance [Z91.14]   . NSVT (nonsustained ventricular tachycardia) (HCC) [I47.2]   . Ischemic cardiomyopathy [I25.5]   . Tobacco abuse [Z72.0]   . Anxiety [F41.9]   . 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 DES PCI to RCA x 2 (3.0 mm x 22 mm Resolute - distal & proximal RCA) [Z95.5] 01/14/2014    Class: Present on Admission  . Non-STEMI (non-ST elevated myocardial infarction) (HCC) [I21.4] 07/22/2013  . Atypical chest pain [R07.89] 07/21/2013  . Bipolar 1 disorder, depressed (HCC) [F31.9] 07/21/2013  . HLD (hyperlipidemia) [E78.5] 04/16/2013  . Hyperlipidemia with target LDL less than 70 [E78.5] 01/27/2009  . SUBSTANCE ABUSE [F19.10] 01/27/2009  . Depression [F32.9] 01/27/2009  . Essential hypertension [I10] 01/27/2009  . CAD S/P BMS PCI to RCA with PTCA for ISR --> followed by stent thrombosis - DES PCI [I25.10, Z98.61] 01/27/2009  . GASTRITIS [K29.70, K29.90] 01/27/2009   Total Time spent with patient: 20 minutes  Past Psychiatric History: As in H&P  Past Medical History:  Past Medical History:  Diagnosis Date  . Anxiety   . Bipolar  1 disorder (HCC)   . CAD S/P percutaneous coronary angioplasty    a. Inf STEMI s/p BMS to RCA 01/2007. b. NSTEMI 07/2013 s/p PTCA to RCA for ISR; c. Transient inferior ST elevation (peak Ti 0.25) 01/2014 s/p PTCA/DES to pRCA,  PTCA/DES to dRCA, EF 50%; d. 12/2014 Inf STEMI: RCA patent prox stent, 178m/d (3.0x32 Synergy DES), EF 35-45; e. 05/2015 Inf STEMI/Cath: LM nl, LAD 10ost/m, D1 75, LCX nl, OM1 25, OM2 30, RCA patent stents, RPDA 30ost.  . Hx of medication noncompliance   . Hyperlipidemia   . Hypertensive heart disease   . Ischemic cardiomyopathy    a. EF 40% in 2011, 60% in 2012. b. EF 55% by cath 07/2013. c. EF 50% by cath 01/2014; d. 12/2014 EF 35-45% by LV gram; e. 05/2015 Echo: EF 50-55% inflat/inf AK, mild AI; f. 06/2015 cMRI: EF 49%, basal & mid inf full thickness scar, subendocardial scar in antsept and antlat wall, correlating w/ D1 dzs.  . Myocardial infarction [facility])    pt report   . NSVT (nonsustained ventricular tachycardia) (HCC)    a. Very brief run during 07/2013 admit for NSTEMI felt due to MI.  . Polysubstance abuse (HCC)    a. h/o tobacco, marijuana and crack cocaine use. b. 07/2013: +UDS THC, neg for cocaine.  . Schizophrenia (HCC)    pt report   . Tobacco abuse     Past Surgical History:  Procedure Laterality Date  . CARDIAC CATHETERIZATION  01/16/2009   normal left main, Cfx with 2-OMs both w/minor irregularities, LAd with 20-30% mid region irregularities, ramus intermediate/optional diagonal with 60% osital narrowing, RCA with stent in distal portion w/20% prox in-stent stenosis (Dr. Mervyn Skeeters. [place])  . CARDIAC CATHETERIZATION  07/23/2013   two vessel obstructive CAD, occluded first diagonal, focal in-stent restenosis in distal RCA (Dr. Peter Swaziland)  . CARDIAC CATHETERIZATION N/A 01/10/2015   Procedure: Left Heart Cath and Coronary Angiography;  Surgeon: Marykay Lex, MD;  Location: [facility] INVASIVE CV LAB;  Service: Cardiovascular;  Laterality: N/A;  . CARDIAC CATHETERIZATION N/A 01/10/2015   Procedure: Coronary Stent Intervention;  Surgeon: Marykay Lex, MD;  Location: [facility] INVASIVE CV LAB;  Service: Cardiovascular;  Laterality: N/A;  . CARDIAC CATHETERIZATION N/A 06/10/2015   Procedure: Left Heart  Cath and Coronary Angiography;  Surgeon: Peter M Swaziland, MD;  Location: [facility] INVASIVE CV LAB;  Service: Cardiovascular;  Laterality: N/A;  . CARDIAC CATHETERIZATION N/A 09/27/2015   Procedure: Left Heart Cath and Coronary Angiography;  Surgeon: Corky Crafts, MD;  Location: [facility] INVASIVE CV LAB;  Service: Cardiovascular;  Laterality: N/A;  . CARDIAC CATHETERIZATION N/A 09/27/2015   Procedure: Coronary Balloon Angioplasty;  Surgeon: Corky Crafts, MD;  Location: [facility] INVASIVE CV LAB;  Service: Cardiovascular;  Laterality: N/A;  . CORONARY ANGIOPLASTY WITH STENT PLACEMENT  02/05/2007   3.5x21mm Quantum non-DES to RCA (Dr. Nicki Guadalajara)  . FEMORAL ARTERY STENT    . LEFT AND RIGHT HEART CATHETERIZATION WITH CORONARY ANGIOGRAM N/A 01/14/2014   Procedure: LEFT AND RIGHT HEART CATHETERIZATION WITH CORONARY ANGIOGRAM;  Surgeon: Kathleene Hazel, MD;  Location: [facility] CATH LAB;  Service: Cardiovascular;  Laterality: N/A;  . LEFT HEART CATH AND CORONARY ANGIOGRAPHY N/A 07/12/2016   Procedure: Left Heart Cath and Coronary Angiography;  Surgeon: Swaziland, Peter M, MD;  Location: [facility] INVASIVE CV LAB;  Service: Cardiovascular;  Laterality: N/A;  . LEFT HEART CATHETERIZATION WITH CORONARY ANGIOGRAM N/A 07/23/2013   Procedure: LEFT HEART CATHETERIZATION WITH CORONARY ANGIOGRAM;  Surgeon: Peter M Swaziland, MD;  Location: [facility] CATH LAB;  Service: Cardiovascular;  Laterality: N/A;   Family History:  Family History  Problem Relation Age of Onset  . CAD Mother   . Heart disease Mother   . Heart attack Mother   . Schizophrenia Mother   . CAD Sister    Family Psychiatric  History: As in H&P Social History:  History  Alcohol Use  . 0.0 oz/week    Comment: 1 40 oz 2 times a week "cause my gf does"     History  Drug Use  . Types: Marijuana    Comment: current user- daily one blunt     Social History   Social History  . Marital status: Single    Spouse name: N/A  . Number of children: N/A  . Years of  education: N/A   Social History Main Topics  . Smoking status: Current Every Day Smoker    Packs/day: 1.00    Years: 30.00    Types: Cigarettes  . Smokeless tobacco: Never Used  . Alcohol use 0.0 oz/week     Comment: 1 40 oz 2 times a week "cause my gf does"  . Drug use: Yes    Types: Marijuana     Comment: current user- daily one blunt   . Sexual activity: Not Currently   Other Topics Concern  . None   Social History Narrative  . None   Additional Social History:        Sleep: Good  Appetite:  Good  Current Medications: Current Facility-Administered Medications  Medication Dose Route Frequency Provider Last Rate Last Dose  . acetaminophen (TYLENOL) tablet 650 mg  650 mg Oral Q6H PRN Rankin, Shuvon B, NP      . alum & mag hydroxide-simeth (MAALOX/MYLANTA) 200-200-20 MG/5ML suspension 30 mL  30 mL Oral Q4H PRN Rankin, Shuvon B, NP      . ARIPiprazole (ABILIFY) tablet 15 mg  15 mg Oral Daily Izediuno, Delight Ovens, MD   15 mg at 10/24/16 0832  . lisinopril (PRINIVIL,ZESTRIL) tablet 5 mg  5 mg Oral Daily Rankin, Shuvon B, NP   5 mg at 10/24/16 0832  . magnesium hydroxide (MILK OF MAGNESIA) suspension 30 mL  30 mL Oral Daily PRN Rankin, Shuvon B, NP      . nicotine polacrilex (NICORETTE) gum 2 mg  2 mg Oral PRN Nira Conn A, NP   2 mg at 10/24/16 0833  . nitroGLYCERIN (NITROSTAT) SL tablet 0.4 mg  0.4 mg Sublingual Q5 Min x 3 PRN Rankin, Shuvon B, NP      . ondansetron (ZOFRAN-ODT) disintegrating tablet 4 mg  4 mg Oral Q8H PRN Money, Gerlene Burdock, FNP   4 mg at 10/23/16 2121  . traZODone (DESYREL) tablet 100 mg  100 mg Oral QHS PRN Nira Conn A, NP   100 mg at 10/23/16 2121    Lab Results: No results found for this or any previous visit (from the past 48 hour(s)).  Blood Alcohol level:  Lab Results  Component Value Date   [facility] <10 10/19/2016   ETH <5 03/13/2016    Metabolic Disorder Labs: Lab Results  Component Value Date   HGBA1C 5.1 07/09/2016   MPG 100 07/09/2016    MPG 105 06/11/2015   Lab Results  Component Value Date   PROLACTIN 9.7 03/08/2015   Lab Results  Component Value Date   CHOL 262 (H) 07/09/2016   TRIG 66 07/09/2016   HDL 47 07/09/2016  CHOLHDL 5.6 07/09/2016   VLDL 13 07/09/2016   LDLCALC 202 (H) 07/09/2016   LDLCALC 161 (H) 06/11/2015    Physical Findings: AIMS: Facial and Oral Movements Muscles of Facial Expression: None, normal Lips and Perioral Area: None, normal Jaw: None, normal Tongue: None, normal,Extremity Movements Upper (arms, wrists, hands, fingers): None, normal Lower (legs, knees, ankles, toes): None, normal, Trunk Movements Neck, shoulders, hips: None, normal, Overall Severity Severity of abnormal movements (highest score from questions above): None, normal Incapacitation due to abnormal movements: None, normal Patient's awareness of abnormal movements (rate only patient's report): No Awareness, Dental Status Current problems with teeth and/or dentures?: Yes Does patient usually wear dentures?: No  CIWA:  CIWA-Ar Total: 1 COWS:  COWS Total Score: 1  Musculoskeletal: Strength & Muscle Tone: within normal limits Gait & Station: normal Patient leans: N/A  Psychiatric Specialty Exam: Physical Exam  Constitutional: He is oriented to person, place, and time. He appears well-developed and well-nourished.  HENT:  Head: Normocephalic and atraumatic.  Respiratory: Effort normal.  Neurological: He is alert and oriented to person, place, and time.  Psychiatric:  As above    ROS  Blood pressure 112/89, pulse 94, temperature 98 F (36.7 C), temperature source Oral, resp. rate 16, height  (1.803 m), weight 52.2 kg (115 lb), SpO2 100 %.Body mass index is 16.04 kg/m.  General Appearance: Calm and cooperative. Appropriate behavior. No EPS  Eye Contact:  Good  Speech:  Spontaneous, normal prosody. Normal tone and rate.   Volume:  Normal  Mood:  Euthymic  Affect:  Appropriate and Full Range  Thought  Process:  Goal Directed  Orientation:  Full (Time, Place, and Person)  Thought Content:  No delusional theme. No preoccupation with violent thoughts. No negative ruminations. No obsession.  No hallucination in any modality.   Suicidal Thoughts:  No  Homicidal Thoughts:  No  Memory:  Immediate;   Good Recent;   Good Remote;   Good  Judgement:  Good  Insight:  Good  Psychomotor Activity:  Normal  Concentration:  Concentration: Good and Attention Span: Good  Recall:  Good  Fund of Knowledge:  Good  Language:  Good  Akathisia:  Negative  Handed:    AIMS (if indicated):     Assets:  Communication Skills Desire for Improvement Housing Intimacy Physical Health Resilience  ADL's:  Fair  Cognition:  WNL  Sleep:  Number of Hours: 5     Treatment Plan Summary: Patient is not depressed. He is not manic or psychotic. He is not a danger to anyone or himself. He is tolerating his medication well. Home tomorrow.   Psychiatric: Bipolar Disorder SUD  Medical: HTN CAD  Psychosocial:  Financial constraints  PLAN: 1. Continue medications at current dose 2. Continue to monitor mood, behavior and interaction with peers   Georgiann Cocker, MD 10/24/2016, 12:35 PMPatient ID: [name], male   DOB: 11-26-1975, 41 y.o.   MRN: [id number]

## 2016-10-24 NOTE — Progress Notes (Addendum)
Data. Patient denies SI/HI/AVH. Verbally contracts for safety on the unit and to come to staff before acting of any self harm thoughts/feelings.  Patient continues to feel nauseous, but has had no episodes of emesis this shift. Patient has been eating more this shift. Continues to nap throughout the shift, but has been up more and interacting appropriately with peers and staff. On his self assessment patient reports 0/10 for depression and hopelessness and 1/10 for anxiety. His goal is: "Stay patient and calm and try to eat."  Patient also asked for coping skills to help deal with his GF, "When she pushes my buttons. I know she does it on purpose, but I don't know how to respond."  Action. Emotional support and encouragement offered. Education provided on medication, indications and side effect. Q 15 minute checks done for safety. Response. Safety on the unit maintained through 15 minute checks.  Medications taken as prescribed. Attended groups. Remained calm and appropriate through out shift.

## 2016-10-24 NOTE — Progress Notes (Signed)
D.  Pt pleasant on approach, reports that he is still nauseas but has been doing a little better today.  Still unable to "hold much down".  Pt did go to evening AA group with appropriate participation.  Pt has been interacting appropriately with peers on the unit.  Pt denies SI/HI/AVH at this time.  Pt is expecting discharge tomorrow and states that he will need a ride provided.  Pt has had seven heart attacks per history and states he can not walk long distances.  A.  Support and encouragement offered, medication given as ordered.  R.  Pt remains safe on the unit, will continue to monitor.

## 2016-10-24 NOTE — Progress Notes (Signed)
Patient did attend the evening speaker AA meeting.  

## 2016-10-25 MED ORDER — ARIPIPRAZOLE 15 MG PO TABS: 15.0000 mg | ORAL_TABLET | Freq: Every day | ORAL | 0 refills | Status: DC

## 2016-10-25 MED ORDER — NICOTINE POLACRILEX 2 MG MT GUM: 2.0000 mg | CHEWING_GUM | OROMUCOSAL | 0 refills | Status: DC | PRN

## 2016-10-25 MED ORDER — LISINOPRIL 5 MG PO TABS: 5.0000 mg | ORAL_TABLET | Freq: Every day | ORAL | 0 refills | Status: DC

## 2016-10-25 MED ORDER — NITROGLYCERIN 0.4 MG SL SUBL: 0.4000 mg | SUBLINGUAL_TABLET | SUBLINGUAL | 0 refills | Status: DC | PRN

## 2016-10-25 MED ORDER — TRAZODONE HCL 100 MG PO TABS: 100.0000 mg | ORAL_TABLET | Freq: Every evening | ORAL | 0 refills | Status: DC | PRN

## 2016-10-25 NOTE — Plan of Care (Signed)
Problem: Activity: Goal: Interest or engagement in activities will improve Outcome: Progressing Pt has been appropriately attending and participating in groups on the unit

## 2016-10-25 NOTE — Progress Notes (Signed)
Recreation Therapy Notes  Date:  10/25/16  Time: 0930 Location: [address] Dayroom  Group Topic: Stress Management  Goal Area(s) Addresses:  Patient will verbalize importance of using healthy stress management.  Patient will identify positive emotions associated with healthy stress management.   Behavioral Response: Engaged  Intervention: Stress Management  Activity :  Progressive Muscle Relaxation.  LRT introduced the stress management technique of progressive muscle relaxation.  LRT read a script to allow patients to focus on each muscle group individually to tense and then relax the muscles.  Patients were to follow along as the script was read to engage in the activity.  Education:  Stress Management, Discharge Planning.   Education Outcome: Acknowledges edcuation/In group clarification offered/Needs additional education  Clinical Observations/Feedback: Pt attended group.   Caroll Rancher, LRT/CTRS        Caroll Rancher A 10/25/2016 11:24 AM

## 2016-10-25 NOTE — Progress Notes (Signed)
Nursing Discharge Note 10/25/2016 [id number]  Data Reports sleeping good with PRN sleep med.  Rates depression 0/10, hopelessness 0/10, and anxiety 1/10. Affect animated.  Denies HI, SI, AVH.  Attending groups, polite and appropriate with staff and peers.  Received discharge orders.  Action Spoke with patient 1:1, nurse offered support to patient throughout shift.  Reviewed medications, discharge instructions, and follow up appointments with patient. Medication scripts reviewed and given to patient.  Paperwork, AVS, SRA, and transition record handed to patient.   Escorted off of unit at 1055. Belongings returned per belongings form.  Discharged to lobby with taxi voucher after calling [employer].    Response Verbalized understanding of discharge teaching. Agrees to contact someone or 911 with thoughts/intent to harm self or others.    To follow up per AVS.

## 2016-10-25 NOTE — [facility] Suicide Risk Assessment (Signed)
[facility] INPATIENT:  Family/Significant Other Suicide Prevention Education  Suicide Prevention Education:  Contact Attempts: Malcolm Metro (pt's girlfriend) [phone] has been identified by the patient as the family member/significant other with whom the patient will be residing, and identified as the person(s) who will aid the patient in the event of a mental health crisis.  With written consent from the patient, two attempts were made to provide suicide prevention education, prior to and/or following the patient's discharge.  We were unsuccessful in providing suicide prevention education.  A suicide education pamphlet was given to the patient to share with family/significant other.  Date and time of first attempt: 10/25/16 at 8:45AM Date and time of second attempt: 10/25/16 at 9:43AM (Voicemail left requesting call back at her earliest convenience).   Ilean Spradlin N Smart LCSW 10/25/2016, 9:43 AM

## 2016-10-25 NOTE — Discharge Summary (Signed)
Physician Discharge Summary Note  Patient:  [name] is an 41 y.o., male  MRN:  [id number]  DOB:  1976/01/07  Patient phone:  [phone] (home)   Patient address:   [address] [place] [address],   Total Time spent with patient: Greater than 30 minutes  Date of Admission:  10/21/2016 Date of Discharge: 10-25-16  Reason for Admission: Worsening symptoms of Bipolar disorder.  Principal Problem: Bipolar I disorder, most recent episode mixed [facility]) Discharge Diagnoses: Patient Active Problem List   Diagnosis Date Noted  . Bipolar I disorder, most recent episode mixed (HCC) [F31.60] 10/22/2016    Priority: High  . Unstable angina (HCC) [I20.0]   . Chronic systolic CHF (congestive heart failure) (HCC) [I50.22] 07/08/2016  . Left leg numbness [R20.0]   . Tetrahydrocannabinol (THC) use disorder, severe, dependence (HCC) [F12.20] 03/14/2016  . Malnutrition of moderate degree [E44.0] 09/28/2015  . Hyperlipidemia [E78.5]   . Schizoaffective disorder, bipolar type (HCC) [F25.0] 03/06/2015  . Nausea with vomiting [R11.2] 03/06/2015  . Hypertensive heart disease [I11.9] 01/11/2015  . ST elevation myocardial infarction (STEMI) of inferior wall (HCC) [I21.19] 01/10/2015  . ST elevation myocardial infarction (STEMI) of inferior wall, initial episode of care (HCC) [I21.19] 01/10/2015  . Medical non-compliance [Z91.19]   . ST elevation myocardial infarction involving right coronary artery (HCC) [I21.11] 01/15/2014  . Hx of medication noncompliance [Z91.14]   . NSVT (nonsustained ventricular tachycardia) (HCC) [I47.2]   . Ischemic cardiomyopathy [I25.5]   . Tobacco abuse [Z72.0]   . Anxiety [F41.9]   . 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 DES PCI to RCA x 2 (3.0 mm x 22 mm Resolute - distal & proximal RCA) [Z95.5] 01/14/2014    Class: Present on Admission  . Non-STEMI (non-ST elevated myocardial infarction) (HCC) [I21.4] 07/22/2013  . Atypical chest  pain [R07.89] 07/21/2013  . Bipolar 1 disorder, depressed (HCC) [F31.9] 07/21/2013  . HLD (hyperlipidemia) [E78.5] 04/16/2013  . Hyperlipidemia with target LDL less than 70 [E78.5] 01/27/2009  . SUBSTANCE ABUSE [F19.10] 01/27/2009  . Depression [F32.9] 01/27/2009  . Essential hypertension [I10] 01/27/2009  . CAD S/P BMS PCI to RCA with PTCA for ISR --> followed by stent thrombosis - DES PCI [I25.10, Z98.61] 01/27/2009  . GASTRITIS [K29.70, K29.90] 01/27/2009   Past Psychiatric History: Bipolar affective disorder, mixed episodes, Cannabis use disorder, dependence.  Past Medical History:  Past Medical History:  Diagnosis Date  . Anxiety   . Bipolar 1 disorder (HCC)   . CAD S/P percutaneous coronary angioplasty    a. Inf STEMI s/p BMS to RCA 01/2007. b. NSTEMI 07/2013 s/p PTCA to RCA for ISR; c. Transient inferior ST elevation (peak Ti 0.25) 01/2014 s/p PTCA/DES to pRCA, PTCA/DES to dRCA, EF 50%; d. 12/2014 Inf STEMI: RCA patent prox stent, 167m/d (3.0x32 Synergy DES), EF 35-45; e. 05/2015 Inf STEMI/Cath: LM nl, LAD 10ost/m, D1 75, LCX nl, OM1 25, OM2 30, RCA patent stents, RPDA 30ost.  . Hx of medication noncompliance   . Hyperlipidemia   . Hypertensive heart disease   . Ischemic cardiomyopathy    a. EF 40% in 2011, 60% in 2012. b. EF 55% by cath 07/2013. c. EF 50% by cath 01/2014; d. 12/2014 EF 35-45% by LV gram; e. 05/2015 Echo: EF 50-55% inflat/inf AK, mild AI; f. 06/2015 cMRI: EF 49%, basal & mid inf full thickness scar, subendocardial scar in antsept and antlat wall, correlating w/ D1 dzs.  . Myocardial infarction (HCC)  pt report   . NSVT (nonsustained ventricular tachycardia) (HCC)    a. Very brief run during 07/2013 admit for NSTEMI felt due to MI.  . Polysubstance abuse (HCC)    a. h/o tobacco, marijuana and crack cocaine use. b. 07/2013: +UDS THC, neg for cocaine.  . Schizophrenia (HCC)    pt report   . Tobacco abuse     Past Surgical History:  Procedure Laterality Date  . CARDIAC  CATHETERIZATION  01/16/2009   normal left main, Cfx with 2-OMs both w/minor irregularities, LAd with 20-30% mid region irregularities, ramus intermediate/optional diagonal with 60% osital narrowing, RCA with stent in distal portion w/20% prox in-stent stenosis (Dr. Mervyn Skeeters. Little)  . CARDIAC CATHETERIZATION  07/23/2013   two vessel obstructive CAD, occluded first diagonal, focal in-stent restenosis in distal RCA (Dr. Peter Swaziland)  . CARDIAC CATHETERIZATION N/A 01/10/2015   Procedure: Left Heart Cath and Coronary Angiography;  Surgeon: Marykay Lex, MD;  Location: [facility] INVASIVE CV LAB;  Service: Cardiovascular;  Laterality: N/A;  . CARDIAC CATHETERIZATION N/A 01/10/2015   Procedure: Coronary Stent Intervention;  Surgeon: Marykay Lex, MD;  Location: [facility] INVASIVE CV LAB;  Service: Cardiovascular;  Laterality: N/A;  . CARDIAC CATHETERIZATION N/A 06/10/2015   Procedure: Left Heart Cath and Coronary Angiography;  Surgeon: Peter M Swaziland, MD;  Location: [facility] INVASIVE CV LAB;  Service: Cardiovascular;  Laterality: N/A;  . CARDIAC CATHETERIZATION N/A 09/27/2015   Procedure: Left Heart Cath and Coronary Angiography;  Surgeon: Corky Crafts, MD;  Location: [facility] INVASIVE CV LAB;  Service: Cardiovascular;  Laterality: N/A;  . CARDIAC CATHETERIZATION N/A 09/27/2015   Procedure: Coronary Balloon Angioplasty;  Surgeon: Corky Crafts, MD;  Location: [facility] INVASIVE CV LAB;  Service: Cardiovascular;  Laterality: N/A;  . CORONARY ANGIOPLASTY WITH STENT PLACEMENT  02/05/2007   3.5x67mm Quantum non-DES to RCA (Dr. Nicki Guadalajara)  . FEMORAL ARTERY STENT    . LEFT AND RIGHT HEART CATHETERIZATION WITH CORONARY ANGIOGRAM N/A 01/14/2014   Procedure: LEFT AND RIGHT HEART CATHETERIZATION WITH CORONARY ANGIOGRAM;  Surgeon: Kathleene Hazel, MD;  Location: [facility] CATH LAB;  Service: Cardiovascular;  Laterality: N/A;  . LEFT HEART CATH AND CORONARY ANGIOGRAPHY N/A 07/12/2016   Procedure: Left Heart Cath and Coronary Angiography;   Surgeon: Swaziland, Peter M, MD;  Location: [facility] INVASIVE CV LAB;  Service: Cardiovascular;  Laterality: N/A;  . LEFT HEART CATHETERIZATION WITH CORONARY ANGIOGRAM N/A 07/23/2013   Procedure: LEFT HEART CATHETERIZATION WITH CORONARY ANGIOGRAM;  Surgeon: Peter M Swaziland, MD;  Location: [facility] CATH LAB;  Service: Cardiovascular;  Laterality: N/A;   Family History:  Family History  Problem Relation Age of Onset  . CAD Mother   . Heart disease Mother   . Heart attack Mother   . Schizophrenia Mother   . CAD Sister    Family Psychiatric  History: See H&P  Social History:  History  Alcohol Use  . 0.0 oz/week    Comment: 1 40 oz 2 times a week "cause my gf does"     History  Drug Use  . Types: Marijuana    Comment: current user- daily one blunt     Social History   Social History  . Marital status: Single    Spouse name: N/A  . Number of children: N/A  . Years of education: N/A   Social History Main Topics  . Smoking status: Current Every Day Smoker    Packs/day: 1.00    Years: 30.00    Types: Cigarettes  .  Smokeless tobacco: Never Used  . Alcohol use 0.0 oz/week     Comment: 1 40 oz 2 times a week "cause my gf does"  . Drug use: Yes    Types: Marijuana     Comment: current user- daily one blunt   . Sexual activity: Not Currently   Other Topics Concern  . None   Social History Narrative  . None   Hospital Course: (Per Md's SRA): Dain is a  41y.o AAM, single, on SSID, lives with his girlfriend. Background history of Bipolar disorder. Presented to the ER via EMS. Reports feeling more depressed lately. Reports being off his medications for two months. Expressed homicidal thoughts towards his girlfriend. Routine labs shows dehydration and electrolyte derangement. UDS is postive for THC. BAL5mg /dl.  After evaluation of his presenting symptoms, Haston was started on the medication regimen for his presenting symptoms. He was treated & discharged on; Abilify 15 mg daily for mood  control, Nicorette gum 2 mg for smoking cessation & Trazodone 100 mg for insomnia. He was enrolled & participated in the group counseling sessions being offered & held on this unit. He learned coping skills. He presented other significant pre-existing medical issues that required treatment. He was resumed on all his pertinent home medications for those health issues. He tolerated his treatment regimen without any adverse effects or reactions reported.   Balian is seen today by the attending psychiatrist. He reports that he is in good spirits. Not feeling depressed. Reports normal energy and interest. Has been maintaining normal biological functions. He is able to think clearly. He is able to focus on task. His thoughts are not crowded or racing. No evidence of mania. No hallucination in any modality. He is not making any delusional statement. No passivity of will/thought. He is fully in touch with reality. No thoughts of suicide. No thoughts of homicide. No violent thoughts. No overwhelming anxiety. No craving for substances.   The nursing staff reports that patient has been appropriate on the unit. Patient has been interacting well with peers. No behavioral issues. Patient has not voiced any suicidal thoughts. Patient has not been observed to be internally stimulated. Patient has been adherent with treatment recommendations. Patient has been tolerating their medication well.   Patient was discussed at the treatment team meeting this morning. The team members feel that patient is back to his baseline level of function. The team agrees with plan to discharge patient today to continue mental health & medical care on an outpatient basis as noted below. He is provided with all the necessary information needed make this appointments without any problems. Pedrohenrique left [facility] with all personal; belongings in no apparent distress. Transportation per taxi cab. [facility] assisted with the taxi fare.Marland Kitchen   Physical  Findings: AIMS: Facial and Oral Movements Muscles of Facial Expression: None, normal Lips and Perioral Area: None, normal Jaw: None, normal Tongue: None, normal,Extremity Movements Upper (arms, wrists, hands, fingers): None, normal Lower (legs, knees, ankles, toes): None, normal, Trunk Movements Neck, shoulders, hips: None, normal, Overall Severity Severity of abnormal movements (highest score from questions above): None, normal Incapacitation due to abnormal movements: None, normal Patient's awareness of abnormal movements (rate only patient's report): No Awareness, Dental Status Current problems with teeth and/or dentures?: Yes Does patient usually wear dentures?: No  CIWA:  CIWA-Ar Total: 1 COWS:  COWS Total Score: 1  Musculoskeletal: Strength & Muscle Tone: within normal limits Gait & Station: normal Patient leans: N/A  Psychiatric Specialty Exam: Physical Exam  Constitutional: He appears well-developed and well-nourished.  HENT:  Head: Normocephalic.  Eyes: Pupils are equal, round, and reactive to light.  Neck: Normal range of motion.  Cardiovascular: Normal rate.   Respiratory: Effort normal.  GI: Soft.  Genitourinary:  Genitourinary Comments: Deferred  Musculoskeletal: Normal range of motion.  Neurological: He is alert.  Skin: Skin is warm.    Review of Systems  Constitutional: Negative.   HENT: Negative.   Eyes: Negative.   Respiratory: Negative.   Cardiovascular: Negative.   Gastrointestinal: Negative.   Genitourinary: Negative.   Musculoskeletal: Negative.   Skin: Negative.   Neurological: Negative.   Endo/Heme/Allergies: Negative.   Psychiatric/Behavioral: Positive for depression (Stable) and substance abuse (Hx. Cannabis use disorder). Negative for hallucinations, memory loss and suicidal ideas. The patient has insomnia (Stable). The patient is not nervous/anxious.     Blood pressure 115/89, pulse 79, temperature 98 F (36.7 C), temperature source  Oral, resp. rate 18, height  (1.803 m), weight 52.2 kg (115 lb), SpO2 100 %.Body mass index is 16.04 kg/m.  See Md's SRA   Have you used any form of tobacco in the last 30 days? (Cigarettes, Smokeless Tobacco, Cigars, and/or Pipes): Yes  Has this patient used any form of tobacco in the last 30 days? (Cigarettes, Smokeless Tobacco, Cigars, and/or Pipes): Yes, recommended nicorette gum 2 mg for smoking cessation.  Blood Alcohol level:  Lab Results  Component Value Date   ETH <10 10/19/2016   ETH <5 03/13/2016   Metabolic Disorder Labs:  Lab Results  Component Value Date   HGBA1C 5.1 07/09/2016   MPG 100 07/09/2016   MPG 105 06/11/2015   Lab Results  Component Value Date   PROLACTIN 9.7 03/08/2015   Lab Results  Component Value Date   CHOL 262 (H) 07/09/2016   TRIG 66 07/09/2016   HDL 47 07/09/2016   CHOLHDL 5.6 07/09/2016   VLDL 13 07/09/2016   LDLCALC 202 (H) 07/09/2016   LDLCALC 161 (H) 06/11/2015   See Psychiatric Specialty Exam and Suicide Risk Assessment completed by Attending Physician prior to discharge.  Discharge destination:  Home  Is patient on multiple antipsychotic therapies at discharge:  No   Has Patient had three or more failed trials of antipsychotic monotherapy by history:  No  Recommended Plan for Multiple Antipsychotic Therapies: NA  Allergies as of 10/25/2016      Reactions   Iodine Anaphylaxis, Swelling   Shellfish Allergy Anaphylaxis   All shellfish   Contrast Media [iodinated Diagnostic Agents] Nausea And Vomiting      Medication List    STOP taking these medications   aspirin 81 MG EC tablet   atorvastatin 80 MG tablet Commonly known as:  LIPITOR   cyanocobalamin 1000 MCG tablet   famotidine 20 MG tablet Commonly known as:  PEPCID   feeding supplement (ENSURE ENLIVE) Liqd   metoprolol succinate 25 MG 24 hr tablet Commonly known as:  TOPROL-XL   nicotine 21 mg/24hr patch Commonly known as:  NICODERM CQ - dosed in mg/24  hours   ticagrelor 90 MG Tabs tablet Commonly known as:  BRILINTA     TAKE these medications     Indication  ARIPiprazole 15 MG tablet Commonly known as:  ABILIFY Take 1 tablet (15 mg total) by mouth daily. For mood control What changed:  medication strength  how much to take  when to take this  additional instructions  Indication:  Mood control   lisinopril 5 MG tablet Commonly known  as:  PRINIVIL,ZESTRIL Take 1 tablet (5 mg total) by mouth daily. For high blood pressure What changed:  additional instructions  Indication:  High Blood Pressure Disorder   nicotine polacrilex 2 MG gum Commonly known as:  NICORETTE Take 1 each (2 mg total) by mouth as needed for smoking cessation. (May buy from over the counter): Smoking cessation  Indication:  Nicotine Addiction   nitroGLYCERIN 0.4 MG SL tablet Commonly known as:  NITROSTAT Place 1 tablet (0.4 mg total) under the tongue every 5 (five) minutes x 3 doses as needed for chest pain.  Indication:  Acute Angina Pectoris, Prinzmetal Angina   traZODone 100 MG tablet Commonly known as:  DESYREL Take 1 tablet (100 mg total) by mouth at bedtime as needed for sleep.  Indication:  Trouble Sleeping      Follow-up Information    [facility] Follow up.   Specialty:  Professional Counselor Why:  Please use [facility] In [facility] to establish for medications management and therapy if desired.  Family Service also offers financial counseling, please ask to be referred.   Contact information: [facility] [place] [address] [place] [address] [phone]        [facility] Follow up on 11/03/2016.   Specialty:  Family Medicine Why:  Initial appointment for assessment to begin outpatient therapy on 10/24 at 4:30 PM and 6:15 PM for medications management.  Please call to cancel/reschedule if needed.  Bring insurance card and photo ID Contact information: [address] [place] [address] [phone]        [facility] Follow up on 10/25/2016.   Specialty:  Behavioral Health Why:  Patient accepted Transitional Care Team, services begin on day of discharge.  [address] [place] [address] [phone]          Follow-up recommendations: Activity:  As tolerated Diet: As recommended by your primary care doctor. Keep all scheduled follow-up appointments as recommended.   Comments:  Patient is instructed prior to discharge to: Take all medications as prescribed by his/her mental healthcare provider. Report any adverse effects and or reactions from the medicines to his/her outpatient provider promptly. Patient has been instructed & cautioned: To not engage in alcohol and or illegal drug use while on prescription medicines. In the event of worsening symptoms, patient is instructed to call the crisis hotline, 911 and or go to the nearest ED for appropriate evaluation and treatment of symptoms. To follow-up with his/her primary care provider for your other medical issues, concerns and or health care needs.   Signed: Sanjuana Kava, NP, PMHNP, FNP-BC 10/25/2016, 2:24 PM

## 2016-10-25 NOTE — [facility] Suicide Risk Assessment (Signed)
[facility] Discharge Suicide Risk Assessment   Principal Problem: Bipolar I disorder, most recent episode mixed [facility]) Discharge Diagnoses:  Patient Active Problem List   Diagnosis Date Noted  . Bipolar I disorder, most recent episode mixed (HCC) [F31.60] 10/22/2016  . Unstable angina (HCC) [I20.0]   . Chronic systolic CHF (congestive heart failure) (HCC) [I50.22] 07/08/2016  . Left leg numbness [R20.0]   . Tetrahydrocannabinol (THC) use disorder, severe, dependence (HCC) [F12.20] 03/14/2016  . Malnutrition of moderate degree [E44.0] 09/28/2015  . Hyperlipidemia [E78.5]   . Schizoaffective disorder, bipolar type (HCC) [F25.0] 03/06/2015  . Nausea with vomiting [R11.2] 03/06/2015  . Hypertensive heart disease [I11.9] 01/11/2015  . ST elevation myocardial infarction (STEMI) of inferior wall (HCC) [I21.19] 01/10/2015  . ST elevation myocardial infarction (STEMI) of inferior wall, initial episode of care (HCC) [I21.19] 01/10/2015  . Medical non-compliance [Z91.19]   . ST elevation myocardial infarction involving right coronary artery (HCC) [I21.11] 01/15/2014  . Hx of medication noncompliance [Z91.14]   . NSVT (nonsustained ventricular tachycardia) (HCC) [I47.2]   . Ischemic cardiomyopathy [I25.5]   . Tobacco abuse [Z72.0]   . Anxiety [F41.9]   . 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 DES PCI to RCA x 2 (3.0 mm x 22 mm Resolute - distal & proximal RCA) [Z95.5] 01/14/2014    Class: Present on Admission  . Non-STEMI (non-ST elevated myocardial infarction) (HCC) [I21.4] 07/22/2013  . Atypical chest pain [R07.89] 07/21/2013  . Bipolar 1 disorder, depressed (HCC) [F31.9] 07/21/2013  . HLD (hyperlipidemia) [E78.5] 04/16/2013  . Hyperlipidemia with target LDL less than 70 [E78.5] 01/27/2009  . SUBSTANCE ABUSE [F19.10] 01/27/2009  . Depression [F32.9] 01/27/2009  . Essential hypertension [I10] 01/27/2009  . CAD S/P BMS PCI to RCA with PTCA for ISR --> followed by stent thrombosis - DES  PCI [I25.10, Z98.61] 01/27/2009  . GASTRITIS [K29.70, K29.90] 01/27/2009    Total Time spent with patient: 30 minutes  Musculoskeletal: Strength & Muscle Tone: within normal limits Gait & Station: normal Patient leans: N/A  Psychiatric Specialty Exam: Review of Systems  Constitutional: Negative.   HENT: Negative.   Eyes: Negative.   Respiratory: Negative.   Cardiovascular: Negative.   Gastrointestinal: Negative.   Genitourinary: Negative.   Musculoskeletal: Negative.   Skin: Negative.   Neurological: Negative.   Endo/Heme/Allergies: Negative.   Psychiatric/Behavioral: Negative for depression, hallucinations, memory loss and suicidal ideas. The patient is not nervous/anxious and does not have insomnia.     Blood pressure 115/89, pulse 79, temperature 98 F (36.7 C), temperature source Oral, resp. rate 18, height  (1.803 m), weight 52.2 kg (115 lb), SpO2 100 %.Body mass index is 16.04 kg/m.  General Appearance: Neatly dressed, pleasant, engaging well and cooperative. Appropriate behavior. Not in any distress. Good relatedness. Not internally stimulated  Eye Contact::  Good  Speech:  Spontaneous, normal prosody. Normal tone and rate.   Volume:  Normal  Mood:  Euthymic  Affect:  Appropriate and Full Range  Thought Process:  Linear  Orientation:  Full (Time, Place, and Person)  Thought Content:  Future oriented. No delusional theme. No preoccupation with violent thoughts. No negative ruminations. No obsession.  No hallucination in any modality.   Suicidal Thoughts:  No  Homicidal Thoughts:  No  Memory:  Immediate;   Good Recent;   Good Remote;   Good  Judgement:  Good  Insight:  Good  Psychomotor Activity:  Normal  Concentration:  Good  Recall:  Good  Fund of Knowledge:Good  Language:  Good  Akathisia:  Negative  Handed:    AIMS (if indicated):     Assets:  Communication Skills Desire for Improvement Financial Resources/Insurance Housing Intimacy Physical  Health Resilience Social Support  Sleep:  Number of Hours: 4.5  Cognition: WNL  ADL's:  Intact   Clinical Assessment::   41y.o AAM, single, on SSID, lives with his girlfriend. Background history of Bipolar disorder. Presented to the ER via EMS. Reports feeling more depressed lately. Reports being off his medications for two months. Expressed homicidal thoughts towards his girlfriend. Routine labs shows dehydration and electrolyte derangement. UDS is postive for THC. BAL5mg /dl.  Seen today. Reports that he is in good spirits. Not feeling depressed. Reports normal energy and interest. Has been maintaining normal biological functions. He is able to think clearly. He is able to focus on task. His thoughts are not crowded or racing. No evidence of mania. No hallucination in any modality. He is not making any delusional statement. No passivity of will/thought. He is fully in touch with reality. No thoughts of suicide. No thoughts of homicide. No violent thoughts. No overwhelming anxiety. No craving for substances.   Nursing staff reports that patient has been appropriate on the unit. Patient has been interacting well with peers. No behavioral issues. Patient has not voiced any suicidal thoughts. Patient has not been observed to be internally stimulated. Patient has been adherent with treatment recommendations. Patient has been tolerating their medication well.   Patient was discussed at team. Team members feels that patient is back to his baseline level of function. Team agrees with plan to discharge patient today.   Demographic Factors:  Male  Loss Factors: Financial problems/change in socioeconomic status  Historical Factors: Impulsivity  Risk Reduction Factors:   Living with another person, especially a relative, Positive social support, Positive therapeutic relationship and Positive coping skills or problem solving skills  Continued Clinical Symptoms:   as above.  Cognitive  Features That Contribute To Risk:  None    Suicide Risk:  Minimal: No identifiable suicidal ideation.  Patient is not having any thoughts of suicide at this time. Modifiable risk factors targeted during this admission includes depression and substance use. Demographical and historical risk factors cannot be modified. Patient is now engaging well. Patient is reliable and is future oriented. We have buffered patient's support structures. At this point, patient is at low risk of suicide. Patient is aware of the effects of psychoactive substances on decision making process. Patient has been provided with emergency contacts. Patient acknowledges to use resources provided if unforseen circumstances changes their current risk stratification.    Follow-up Information    [facility] Follow up.   Specialty:  Professional Counselor Why:  Please use [facility] to establish for medications management and therapy if desired.  Family Service also offers financial counseling, please ask to be referred.   Contact information: [facility] [place] [address] [place] [address] [phone]        [facility] Follow up.   Specialty:  Family Medicine Why:  Referral made for medications management and therapy. The office was closed Friday and receptionist stated that she would call you this afternoon with appt time and date for first appt. Thank you.  Contact information: [address] [place] [address] [phone]           Plan Of Care/Follow-up recommendations:  1. Continue current psychotropic medications  2. Mental health and addiction follow up as arranged.  3. Discharge in care of his girlfriend.  4. Provided limited quantity of prescriptions   Georgiann Cocker, MD 10/25/2016, 9:37 AM

## 2016-10-25 NOTE — Progress Notes (Addendum)
  [facility] Adult Case Management Discharge Plan :  Will you be returning to the same living situation after discharge:  Yes,  home At discharge, do you have transportation home?: Yes,  taxi voucher-pt unable to take bus due to health issues. Do you have the ability to pay for your medications: Yes,  medicare  Release of information consent forms completed and submitted to medical records by CSW.  Patient to Follow up at: Follow-up Information    [facility] Follow up.   Specialty:  Professional Counselor Why:  Please use [facility] to establish for medications management and therapy if desired.  Family Service also offers financial counseling, please ask to be referred.   Contact information: [facility] [place] [address] [place] [address] [phone]        [facility] Follow up on 11/03/2016.   Specialty:  Family Medicine Why:  Initial appointment for assessment to begin outpatient therapy on 10/24 at 4:30 PM and 6:15 PM for medications management.  Please call to cancel/reschedule if needed.  Bring insurance card and photo ID Contact information: [address] [place] [address] [phone]           Next level of care provider has access to [facility] Link:no  Safety Planning and Suicide Prevention discussed: Yes,  SPE completed with pt; contact attempts made with pt's fiance/girlfriend. SPI pamphlet and Mobile Crisis information provided to pt.   Have you used any form of tobacco in the last 30 days? (Cigarettes, Smokeless Tobacco, Cigars, and/or Pipes): Yes  Has patient been referred to the Quitline?: Patient refused referral  Patient has been referred for addiction treatment: Yes  [employer], LCSW 10/25/2016, 10:28 AM

## 2016-10-25 NOTE — Progress Notes (Signed)
Pt continues to experience nausea, no vomiting at this time.  Pt requested Zofran in the hopes that he will be able to eat something at breakfast later.

## 2016-11-25 ENCOUNTER — Ambulatory Visit (INDEPENDENT_AMBULATORY_CARE_PROVIDER_SITE_OTHER): Payer: Medicare Other | Admitting: Cardiovascular Disease

## 2016-11-25 ENCOUNTER — Encounter: Payer: Self-pay | Admitting: Cardiovascular Disease

## 2016-11-25 VITALS — BP 112/78 | HR 67 | Ht 71.0 in | Wt 129.0 lb

## 2016-11-25 DIAGNOSIS — F3170 Bipolar disorder, currently in remission, most recent episode unspecified: Secondary | ICD-10-CM | POA: Diagnosis not present

## 2016-11-25 DIAGNOSIS — I2510 Atherosclerotic heart disease of native coronary artery without angina pectoris: Secondary | ICD-10-CM

## 2016-11-25 DIAGNOSIS — E785 Hyperlipidemia, unspecified: Secondary | ICD-10-CM

## 2016-11-25 DIAGNOSIS — Z9861 Coronary angioplasty status: Secondary | ICD-10-CM | POA: Diagnosis not present

## 2016-11-25 DIAGNOSIS — I255 Ischemic cardiomyopathy: Secondary | ICD-10-CM | POA: Diagnosis not present

## 2016-11-25 DIAGNOSIS — Z79899 Other long term (current) drug therapy: Secondary | ICD-10-CM

## 2016-11-25 DIAGNOSIS — I10 Essential (primary) hypertension: Secondary | ICD-10-CM | POA: Diagnosis not present

## 2016-11-25 DIAGNOSIS — I5022 Chronic systolic (congestive) heart failure: Secondary | ICD-10-CM

## 2016-11-25 MED ORDER — CLOPIDOGREL BISULFATE 75 MG PO TABS: 75.0000 mg | ORAL_TABLET | Freq: Every day | ORAL | 3 refills | Status: DC

## 2016-11-25 MED ORDER — METOPROLOL SUCCINATE ER 25 MG PO TB24: 12.5000 mg | ORAL_TABLET | Freq: Every day | ORAL | 3 refills | Status: DC

## 2016-11-25 MED ORDER — ROSUVASTATIN CALCIUM 40 MG PO TABS: 40.0000 mg | ORAL_TABLET | Freq: Every day | ORAL | 3 refills | Status: DC

## 2016-11-25 NOTE — Progress Notes (Signed)
Patient ID: [name], male   DOB: 07-17-1975, 41 y.o.   MRN: [id number]     HPI: [name] is a 41 y.o. male who presents to the office today for a  follow up cardiology evaluation.  I have not seen him since August 2015.  [name] is a 41 year old, African American male with a history of CAD, who suffered an inferior wall ST segment elevation myocardial infarction and underwent bare-metal stenting to his RCA in 2009.  He has history of hypertension, hyperlipidemia, bipolar disorder, tobacco use, and marijuana use.  In the medical records.  There is indication of possible crack cocaine use, but the patient vehemently denies this.  He was admitted to [facility] on 07/21/2013 with squeezing chest pressure.  He ruled in for a non-STEMI with a peak troponin of 16.  He had transient nonsustained VT on telemetry, related to his infarct.  He was premedicated for possible dye allergy, and underwent cardiac catheterization by Dr. Swaziland, which revealed occlusion of the first diagonal vessel, and focal restenosis within the previously placed distal RCA stent.  This was treated with 3.5 x 10 mm cutting balloon intervention to the in-stent restenosis with plans for medical therapy for the diagonal occlusion.  He had mild 20% stenoses in the LAD, and in addition to occlusion of the first diagonal vessel, the second diagonal vessel, 70% stenosis at the ostium.  The circumflex vessel had 50% stenosis in the midportion.  The proximal RCA had disease of 30% and had 80-90% focal in-stent restenosis.  I have not seen him since August 2015.  Subsequently, he has undergone catheterization in 2016 by Dr. Herbie Baltimore, in May 2017 by Dr. Swaziland and September 2017 by Dr. Eldridge Dace and most recently in July 2018 by Dr. Swaziland.  At his last catheterization he was found to have single-vessel obstruction involving a small diagonal branch, with mild stenoses in the LAD and circumflex with continued patency of stents in the  proximal and distal RCA.  He had mild-to-moderate LV dysfunction with an EF of 45% and mild inferior akinesis.  He was recently hospitalized on the psychiatry service with suicidal ideaion when he was off his bipolar medication.  Since being discharged, he believes he is stable.  He is living in an apartment.  He smokes marijuana daily and has beer.  He denies ever using cocaine.  He smokes cigarettes.  He denies exertional chest tightness.  He is unaware of palpitations.  He denies PND, orthopnea.  He presents for cardiology evaluation.   Past Medical History:  Diagnosis Date  . Anxiety   . Bipolar 1 disorder (HCC)   . CAD S/P percutaneous coronary angioplasty    a. Inf STEMI s/p BMS to RCA 01/2007. b. NSTEMI 07/2013 s/p PTCA to RCA for ISR; c. Transient inferior ST elevation (peak Ti 0.25) 01/2014 s/p PTCA/DES to pRCA, PTCA/DES to dRCA, EF 50%; d. 12/2014 Inf STEMI: RCA patent prox stent, 139m/d (3.0x32 Synergy DES), EF 35-45; e. 05/2015 Inf STEMI/Cath: LM nl, LAD 10ost/m, D1 75, LCX nl, OM1 25, OM2 30, RCA patent stents, RPDA 30ost.  . Hx of medication noncompliance   . Hyperlipidemia   . Hypertensive heart disease   . Ischemic cardiomyopathy    a. EF 40% in 2011, 60% in 2012. b. EF 55% by cath 07/2013. c. EF 50% by cath 01/2014; d. 12/2014 EF 35-45% by LV gram; e. 05/2015 Echo: EF 50-55% inflat/inf AK, mild AI; f. 06/2015 cMRI: EF 49%, basal &  mid inf full thickness scar, subendocardial scar in antsept and antlat wall, correlating w/ D1 dzs.  . Myocardial infarction [facility](HCC)    pt report   . NSVT (nonsustained ventricular tachycardia) (HCC)    a. Very brief run during 07/2013 admit for NSTEMI felt due to MI.  . Polysubstance abuse (HCC)    a. h/o tobacco, marijuana and crack cocaine use. b. 07/2013: +UDS THC, neg for cocaine.  . Schizophrenia (HCC)    pt report   . Tobacco abuse     Past Surgical History:  Procedure Laterality Date  . CARDIAC CATHETERIZATION  01/16/2009   normal left main, Cfx with  2-OMs both w/minor irregularities, LAd with 20-30% mid region irregularities, ramus intermediate/optional diagonal with 60% osital narrowing, RCA with stent in distal portion w/20% prox in-stent stenosis (Dr. Mervyn SkeetersA. Little)  . CARDIAC CATHETERIZATION  07/23/2013   two vessel obstructive CAD, occluded first diagonal, focal in-stent restenosis in distal RCA (Dr. Peter SwazilandJordan)  . CARDIAC CATHETERIZATION N/A 01/10/2015   Procedure: Left Heart Cath and Coronary Angiography;  Surgeon: Marykay Lexavid W Harding, MD;  [facility];  Service: Cardiovascular;  Laterality: N/A;  . CARDIAC CATHETERIZATION N/A 01/10/2015   Procedure: Coronary Stent Intervention;  Surgeon: Marykay Lexavid W Harding, MD;  [facility];  Service: Cardiovascular;  Laterality: N/A;  . CARDIAC CATHETERIZATION N/A 06/10/2015   Procedure: Left Heart Cath and Coronary Angiography;  Surgeon: Peter M SwazilandJordan, MD;  [facility];  Service: Cardiovascular;  Laterality: N/A;  . CARDIAC CATHETERIZATION N/A 09/27/2015   Procedure: Left Heart Cath and Coronary Angiography;  Surgeon: Corky CraftsJayadeep S Varanasi, MD;  [facility];  Service: Cardiovascular;  Laterality: N/A;  . CARDIAC CATHETERIZATION N/A 09/27/2015   Procedure: Coronary Balloon Angioplasty;  Surgeon: Corky CraftsJayadeep S Varanasi, MD;  Location: [facility] INV[facility] CV LAB;  Service: Cardiovascular;  Laterality: N/A;  . CORONARY ANGIOPLASTY WITH STENT PLACEMENT  02/05/2007   3.5x6718mm Quantum non-DES to RCA (Dr. Nicki Guadalajarahomas Kelly)  . FEMORAL ARTERY STENT    . LEFT AND RIGHT HEART CATHETERIZATION WITH CORONARY ANGIOGRAM N/A 01/14/2014   Procedure: LEFT AND RIGHT HEART CATHETERIZATION WITH CORONARY ANGIOGRAM;  Surgeon: Kathleene Hazelhristopher D McAlhany, MD;  [facility];  Service: Cardiovascular;  Laterality: N/A;  . LEFT HEART CATH AND CORONARY ANGIOGRAPHY N/A 07/12/2016   Procedure: Left Heart Cath and Coronary Angiography;  Surgeon: SwazilandJordan, Peter M, MD;  [facility];  Service: Cardiovascular;  Laterality: N/A;  . LEFT HEART CATHETERIZATION WITH CORONARY ANGIOGRAM N/A 07/23/2013   Procedure: LEFT HEART CATHETERIZATION WITH CORONARY ANGIOGRAM;  Surgeon: Peter M SwazilandJordan, MD;  [facility];  Service: Cardiovascular;  Laterality: N/A;    Allergies  Allergen Reactions  . Iodine Anaphylaxis and Swelling  . Shellfish Allergy Anaphylaxis    All shellfish  . Contrast Media [Iodinated Diagnostic Agents] Nausea And Vomiting    Current Outpatient Medications  Medication Sig Dispense Refill  . ARIPiprazole (ABILIFY) 15 MG tablet Take 1 tablet (15 mg total) by mouth daily. For mood control 30 tablet 0  . lisinopril (PRINIVIL,ZESTRIL) 5 MG tablet Take 1 tablet (5 mg total) by mouth daily. For high blood pressure 15 tablet 0  . nicotine polacrilex (NICORETTE) 2 MG gum Take 1 each (2 mg total) by mouth as needed for smoking cessation. (May buy from over the counter): Smoking cessation 100 tablet 0  . nitroGLYCERIN (NITROSTAT) 0.4 MG SL tablet Place 1 tablet (0.4 mg total)  under the tongue every 5 (five) minutes x 3 doses as needed for chest pain. 1 tablet 0  . traZODone (DESYREL) 100 MG tablet Take 1 tablet (100 mg total) by mouth at bedtime as needed for sleep. 30 tablet 0   No current facility-administered medications for this visit.     Social History   Socioeconomic History  . Marital status: Single    Spouse name: Not on file  . Number of children: Not on file  . Years of education: Not on file  . Highest education level: Not on file  Social Needs  . Financial resource strain: Not on file  . Food insecurity - worry: Not on file  . Food insecurity - inability: Not on file  . Transportation needs - medical: Not on file  . Transportation needs - non-medical: Not on file  Occupational History  . Not on file  Tobacco Use  . Smoking status: Current Every Day Smoker    Packs/day: 1.00    Years: 30.00    Pack years: 30.00    Types: Cigarettes    . Smokeless tobacco: Never Used  Substance and Sexual Activity  . Alcohol use: Yes    Alcohol/week: 0.0 oz    Comment: 1 40 oz 2 times a week "cause my gf does"  . Drug use: Yes    Types: Marijuana    Comment: current user- daily one blunt   . Sexual activity: Not Currently  Other Topics Concern  . Not on file  Social History Narrative  . Not on file    Family History  Problem Relation Age of Onset  . CAD Mother   . Heart disease Mother   . Heart attack Mother   . Schizophrenia Mother   . CAD Sister     ROS General: Negative; No fevers, chills, or night sweats HEENT: Negative; No changes in vision or hearing, sinus congestion, difficulty swallowing Pulmonary: Negative; No cough, wheezing, shortness of breath, hemoptysis Cardiovascular: See HPI:  GI: Negative; No nausea, vomiting, diarrhea, or abdominal pain GU: Negative; No dysuria, hematuria, or difficulty voiding Musculoskeletal: Negative; no myalgias, joint pain, or weakness Hematologic: Negative; no easy bruising, bleeding Endocrine: Negative; no heat/cold intolerance; no diabetes, Neuro: Negative; no changes in balance, headaches Skin: Negative; No rashes or skin lesions Psychiatric: History of bipolar disorder.  History of substance abuse.  Recent hospitalization when off bipolar medications for suicidal ideation  Sleep: Negative; No snoring,  daytime sleepiness, hypersomnolence, bruxism, restless legs, hypnogognic hallucinations. Other comprehensive 14 point system review is negative   Physical Exam BP 112/78   Pulse 67   Ht 5\' 11"  (1.803 m)   Wt 129 lb (58.5 kg)   BMI 17.99 kg/m    Wt Readings from Last 3 Encounters:  11/25/16 129 lb (58.5 kg)  10/19/16 125 lb (56.7 kg)  07/12/16 116 lb 14.4 oz (53 kg)   General: Alert, oriented, no distress.  Skin: normal turgor, no rashes, warm and dry HEENT: Normocephalic, atraumatic. Pupils equal round and reactive to light; sclera anicteric; extraocular muscles  intact;  Nose without nasal septal hypertrophy Mouth/Parynx benign; Mallinpatti scale 2 Neck: No JVD, no carotid bruits; normal carotid upstroke Lungs: clear to ausculatation and percussion; no wheezing or rales Chest wall: without tenderness to palpitation Heart: PMI not displaced, RRR, s1 s2 normal, 1/6 systolic murmur, no diastolic murmur, no rubs, gallops, thrills, or heaves Abdomen: soft, nontender; no hepatosplenomehaly, BS+; abdominal aorta nontender and not dilated by palpation. Back: no CVA  tenderness Pulses 2+ Musculoskeletal: full range of motion, normal strength, no joint deformities Extremities: no clubbing cyanosis or edema, Homan's sign negative  Neurologic: grossly nonfocal; Cranial nerves grossly wnl Psychologic: Normal mood and affect   ECG (independently read by me):Normal sinus rhythm at 67 bpm.  Inferior Q waves concordant with old inferior MI, but preserved R waves.  Incomplete right bundle branch block.  Normal intervals.  No ectopy.  August 2015 ECG (independently read by me): Normal sinus rhythm  LABS:  BMET    Component Value Date/Time   NA 137 10/20/2016 1511   K 3.5 10/20/2016 1511   CL 94 (L) 10/20/2016 1511   CO2 29 10/19/2016 1915   GLUCOSE 89 10/20/2016 1511   BUN 6 10/20/2016 1511   CREATININE 1.20 10/20/2016 1511   CALCIUM 10.0 10/19/2016 1915   GFRNONAA >60 10/19/2016 1915   GFRAA >60 10/19/2016 1915     Hepatic Function Panel     Component Value Date/Time   PROT 7.8 10/19/2016 1915   ALBUMIN 4.6 10/19/2016 1915   AST 22 10/19/2016 1915   ALT 18 10/19/2016 1915   ALKPHOS 70 10/19/2016 1915   BILITOT 1.6 (H) 10/19/2016 1915   BILIDIR <0.1 (L) 11/19/2015 1041   IBILI NOT CALCULATED 11/19/2015 1041     CBC    Component Value Date/Time   WBC 3.8 (L) 10/19/2016 1915   RBC 4.82 10/19/2016 1915   HGB 14.6 10/20/2016 1511   HCT 43.0 10/20/2016 1511   PLT 188 10/19/2016 1915   MCV 90.0 10/19/2016 1915   MCH 31.7 10/19/2016 1915    MCHC 35.3 10/19/2016 1915   RDW 14.5 10/19/2016 1915   LYMPHSABS 1.1 07/13/2016 0130   MONOABS 0.3 07/13/2016 0130   EOSABS 0.0 07/13/2016 0130   BASOSABS 0.0 07/13/2016 0130     BNP    Component Value Date/Time   PROBNP 51.6 07/21/2013 0530    Lipid Panel     Component Value Date/Time   CHOL 262 (H) 07/09/2016 0036   TRIG 66 07/09/2016 0036   HDL 47 07/09/2016 0036   CHOLHDL 5.6 07/09/2016 0036   VLDL 13 07/09/2016 0036   LDLCALC 202 (H) 07/09/2016 0036     RADIOLOGY: No results found.  IMPRESSION: No diagnosis found.  ASSESSMENT AND PLAN: [name] is a 41 year old African-American gentleman who suffered an initial MI in January 2009 treated with bare-metal stenting to his RCA.  Ejection fraction at that time was possibly 40%.  2015.  He suffered a NSTEMI and was found to have high-grade in-stent restenosis to his RCA, as well as an occluded diagonal vessel.  Ejection fraction was 55% by catheterization.  Since 2015.  He has undergone at least 4 additional cardiac catheterizations with his most recent being in July 2018.  I reviewed the studies.  Presently, he continues to smoke cigarettes and use marijuana.  He denies ever using cocaine.  With his significant stenting of his proximal and distal RCA, I have recommended resumption of dual antiplatelet therapy and Plavix.  His blood pressure today is stable on lisinopril 5 mg.  He has been taking trazodone to help with sleep.  He is on Abilify for his psychiatric medication.  He was recently hospitalized for potential suicide risk, which she feels now is under control.  Laboratory in June 2018 shows significant hyperlipidemia with cholesterol 262, and LDL cholesterol 202.  I'm recommending initiation of Crestor 40 mg to his medical regimen.  I'm also recommending low-dose beta blocker  therapy with Toprol-XL 12.5 mg daily with his underlying CAD. Marland Kitchen.  We discussed smoking cessation.  We discussed exercise.  Fasting laboratory will be  obtained in 2 months with his medication changes.  I will see him in 3 months for reevaluation.  Time spent: 40 minutes  Lennette Biharihomas A. Kelly, MD, [facility]  11/25/2016 1:15 PM

## 2016-11-25 NOTE — Patient Instructions (Addendum)
Medication Instructions:  START metoprolol succinate (Toprol XL) 12.5 mg (1/2 tablet) daily  START rosuvastatin (Crestor) 40 mg daily  START Plavix 75mg  daily  Continue Aspirin 81 mg daily  Labwork: Please return for FASTING labs in 2 months (CMET, CBC, Lipid, TSH)-lab slips provided  Follow-Up: Your physician recommends that you schedule a follow-up appointment in: 3 months with Dr. Tresa EndoKelly.    Any Other Special Instructions Will Be Listed Below (If Applicable).     If you need a refill on your cardiac medications before your next appointment, please call your pharmacy.

## 2016-11-27 ENCOUNTER — Encounter: Payer: Self-pay | Admitting: Cardiovascular Disease

## 2017-02-19 ENCOUNTER — Emergency Department (HOSPITAL_COMMUNITY)
Admission: EM | Admit: 2017-02-19 | Discharge: 2017-02-19 | Disposition: A | Discharge status: Home / Self Care | Payer: Medicare Other | Source: Home / Self Care | Attending: Emergency Medicine | Admitting: Emergency Medicine

## 2017-02-19 ENCOUNTER — Encounter (HOSPITAL_COMMUNITY): Payer: Self-pay

## 2017-02-19 DIAGNOSIS — R197 Diarrhea, unspecified: Secondary | ICD-10-CM | POA: Insufficient documentation

## 2017-02-19 DIAGNOSIS — N289 Disorder of kidney and ureter, unspecified: Secondary | ICD-10-CM

## 2017-02-19 DIAGNOSIS — Z7982 Long term (current) use of aspirin: Secondary | ICD-10-CM | POA: Insufficient documentation

## 2017-02-19 DIAGNOSIS — I252 Old myocardial infarction: Secondary | ICD-10-CM | POA: Insufficient documentation

## 2017-02-19 DIAGNOSIS — I2510 Atherosclerotic heart disease of native coronary artery without angina pectoris: Secondary | ICD-10-CM | POA: Insufficient documentation

## 2017-02-19 DIAGNOSIS — R112 Nausea with vomiting, unspecified: Secondary | ICD-10-CM | POA: Insufficient documentation

## 2017-02-19 DIAGNOSIS — I110 Hypertensive heart disease with heart failure: Secondary | ICD-10-CM

## 2017-02-19 DIAGNOSIS — I5022 Chronic systolic (congestive) heart failure: Secondary | ICD-10-CM

## 2017-02-19 DIAGNOSIS — Z7902 Long term (current) use of antithrombotics/antiplatelets: Secondary | ICD-10-CM | POA: Insufficient documentation

## 2017-02-19 DIAGNOSIS — F17210 Nicotine dependence, cigarettes, uncomplicated: Secondary | ICD-10-CM | POA: Insufficient documentation

## 2017-02-19 DIAGNOSIS — K256 Chronic or unspecified gastric ulcer with both hemorrhage and perforation: Secondary | ICD-10-CM | POA: Diagnosis not present

## 2017-02-19 DIAGNOSIS — K920 Hematemesis: Secondary | ICD-10-CM | POA: Diagnosis not present

## 2017-02-19 LAB — URINALYSIS, ROUTINE W REFLEX MICROSCOPIC
BACTERIA UA: NONE SEEN
Glucose, UA: 50 mg/dL — AB
Hgb urine dipstick: NEGATIVE
KETONES UR: 80 mg/dL — AB
Leukocytes, UA: NEGATIVE
Nitrite: NEGATIVE
PROTEIN: 100 mg/dL — AB
Specific Gravity, Urine: 1.034 — ABNORMAL HIGH (ref 1.005–1.030)
pH: 6 (ref 5.0–8.0)

## 2017-02-19 LAB — COMPREHENSIVE METABOLIC PANEL
ALT: 20 U/L (ref 17–63)
AST: 26 U/L (ref 15–41)
Albumin: 4.5 g/dL (ref 3.5–5.0)
Alkaline Phosphatase: 82 U/L (ref 38–126)
Anion gap: 22 — ABNORMAL HIGH (ref 5–15)
BUN: 9 mg/dL (ref 6–20)
CHLORIDE: 94 mmol/L — AB (ref 101–111)
CO2: 22 mmol/L (ref 22–32)
CREATININE: 1.47 mg/dL — AB (ref 0.61–1.24)
Calcium: 10 mg/dL (ref 8.9–10.3)
GFR calc non Af Amer: 58 mL/min — ABNORMAL LOW (ref >60)
Glucose, Bld: 138 mg/dL — ABNORMAL HIGH (ref 65–99)
Potassium: 4.2 mmol/L (ref 3.5–5.1)
SODIUM: 138 mmol/L (ref 135–145)
Total Bilirubin: 1.2 mg/dL (ref 0.3–1.2)
Total Protein: 8.2 g/dL — ABNORMAL HIGH (ref 6.5–8.1)

## 2017-02-19 LAB — CBC
HCT: 45.7 % (ref 39.0–52.0)
HEMOGLOBIN: 15.9 g/dL (ref 13.0–17.0)
MCH: 31.5 pg (ref 26.0–34.0)
MCHC: 34.8 g/dL (ref 30.0–36.0)
MCV: 90.7 fL (ref 78.0–100.0)
PLATELETS: 270 10*3/uL (ref 150–400)
RBC: 5.04 MIL/uL (ref 4.22–5.81)
RDW: 15.9 % — ABNORMAL HIGH (ref 11.5–15.5)
WBC: 4.8 10*3/uL (ref 4.0–10.5)

## 2017-02-19 LAB — LIPASE, BLOOD: LIPASE: 22 U/L (ref 11–51)

## 2017-02-19 MED ORDER — ONDANSETRON 4 MG PO TBDP
4.0000 mg | ORAL_TABLET | Freq: Once | ORAL | Status: AC | PRN
  Administered 2017-02-19: 4 mg via ORAL
  Filled 2017-02-19: qty 1

## 2017-02-19 MED ORDER — SODIUM CHLORIDE 0.9 % IV BOLUS (SEPSIS)
1000.0000 mL | Freq: Once | INTRAVENOUS | Status: AC
  Administered 2017-02-19: 1000 mL via INTRAVENOUS

## 2017-02-19 MED ORDER — PROMETHAZINE HCL 25 MG PO TABS: 25.0000 mg | ORAL_TABLET | Freq: Four times a day (QID) | ORAL | 0 refills | Status: DC | PRN

## 2017-02-19 MED ORDER — ONDANSETRON 4 MG PO TBDP: 4.0000 mg | ORAL_TABLET | Freq: Three times a day (TID) | ORAL | 0 refills | Status: DC | PRN

## 2017-02-19 MED ORDER — PROMETHAZINE HCL 25 MG/ML IJ SOLN
25.0000 mg | Freq: Once | INTRAMUSCULAR | Status: AC
  Administered 2017-02-19: 25 mg via INTRAVENOUS
  Filled 2017-02-19: qty 1

## 2017-02-19 NOTE — ED Provider Notes (Signed)
[facility] EMERGENCY DEPARTMENT Provider Note   CSN: [id number] Arrival date & time: 02/19/17  1104     History   Chief Complaint Chief Complaint  Patient presents with  . Emesis    HPI [name] is a 42 y.o. male.  HPI  Patient with history of CAD, hypertension, bipolar and schizophrenia, polysubstance abuse presenting with complaint of vomiting and diarrhea he states for the past 2 days he has had numerous episodes of nonbloody nonbilious emesis.  He has had one loose stool per day.  He states he feels he is dehydrated.  He has no fever and no significant abdominal pain.  Denies chest pain or shortness of breath.  He states he has had decreased ability to urinate.  He has not had any treatment prior to arrival.  No specific sick contacts or recent travel.  There are no other associated systemic symptoms, there are no other alleviating or modifying factors.   Past Medical History:  Diagnosis Date  . Anxiety   . Bipolar 1 disorder (HCC)   . CAD S/P percutaneous coronary angioplasty    a. Inf STEMI s/p BMS to RCA 01/2007. b. NSTEMI 07/2013 s/p PTCA to RCA for ISR; c. Transient inferior ST elevation (peak Ti 0.25) 01/2014 s/p PTCA/DES to pRCA, PTCA/DES to dRCA, EF 50%; d. 12/2014 Inf STEMI: RCA patent prox stent, 147m/d (3.0x32 Synergy DES), EF 35-45; e. 05/2015 Inf STEMI/Cath: LM nl, LAD 10ost/m, D1 75, LCX nl, OM1 25, OM2 30, RCA patent stents, RPDA 30ost.  . Hx of medication noncompliance   . Hyperlipidemia   . Hypertensive heart disease   . Ischemic cardiomyopathy    a. EF 40% in 2011, 60% in 2012. b. EF 55% by cath 07/2013. c. EF 50% by cath 01/2014; d. 12/2014 EF 35-45% by LV gram; e. 05/2015 Echo: EF 50-55% inflat/inf AK, mild AI; f. 06/2015 cMRI: EF 49%, basal & mid inf full thickness scar, subendocardial scar in antsept and antlat wall, correlating w/ D1 dzs.  . Myocardial infarction [facility])    pt report   . NSVT (nonsustained ventricular tachycardia) (HCC)      a. Very brief run during 07/2013 admit for NSTEMI felt due to MI.  . Polysubstance abuse (HCC)    a. h/o tobacco, marijuana and crack cocaine use. b. 07/2013: +UDS THC, neg for cocaine.  . Schizophrenia (HCC)    pt report   . Tobacco abuse     Patient Active Problem List   Diagnosis Date Noted  . Bipolar I disorder, most recent episode mixed (HCC) 10/22/2016  . Unstable angina (HCC)   . Chronic systolic CHF (congestive heart failure) (HCC) 07/08/2016  . Left leg numbness   . Tetrahydrocannabinol (THC) use disorder, severe, dependence (HCC) 03/14/2016  . Malnutrition of moderate degree 09/28/2015  . Hyperlipidemia   . Schizoaffective disorder, bipolar type (HCC) 03/06/2015  . Nausea with vomiting 03/06/2015  . Hypertensive heart disease 01/11/2015  . ST elevation myocardial infarction (STEMI) of inferior wall (HCC) 01/10/2015  . ST elevation myocardial infarction (STEMI) of inferior wall, initial episode of care (HCC) 01/10/2015  . Medical non-compliance   . ST elevation myocardial infarction involving right coronary artery (HCC) 01/15/2014  . Hx of medication noncompliance   . NSVT (nonsustained ventricular tachycardia) (HCC)   . Ischemic cardiomyopathy   . Tobacco abuse   . Anxiety   . 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 DES PCI to RCA x 2 (3.0 mm x 22 mm  Resolute - distal & proximal RCA) 01/14/2014    Class: Present on Admission  . Non-STEMI (non-ST elevated myocardial infarction) (HCC) 07/22/2013  . Atypical chest pain 07/21/2013  . Bipolar 1 disorder, depressed (HCC) 07/21/2013  . HLD (hyperlipidemia) 04/16/2013  . Hyperlipidemia with target LDL less than 70 01/27/2009  . SUBSTANCE ABUSE 01/27/2009  . Depression 01/27/2009  . Essential hypertension 01/27/2009  . CAD S/P BMS PCI to RCA with PTCA for ISR --> followed by stent thrombosis - DES PCI 01/27/2009  . GASTRITIS 01/27/2009    Past Surgical History:  Procedure Laterality Date  . CARDIAC CATHETERIZATION   01/16/2009   normal left main, Cfx with 2-OMs both w/minor irregularities, LAd with 20-30% mid region irregularities, ramus intermediate/optional diagonal with 60% osital narrowing, RCA with stent in distal portion w/20% prox in-stent stenosis (Dr. Mervyn Skeeters. [place])  . CARDIAC CATHETERIZATION  07/23/2013   two vessel obstructive CAD, occluded first diagonal, focal in-stent restenosis in distal RCA (Dr. Peter Swaziland)  . CARDIAC CATHETERIZATION N/A 01/10/2015   Procedure: Left Heart Cath and Coronary Angiography;  Surgeon: Marykay Lex, MD;  Location: [facility] INVASIVE CV LAB;  Service: Cardiovascular;  Laterality: N/A;  . CARDIAC CATHETERIZATION N/A 01/10/2015   Procedure: Coronary Stent Intervention;  Surgeon: Marykay Lex, MD;  Location: [facility] INVASIVE CV LAB;  Service: Cardiovascular;  Laterality: N/A;  . CARDIAC CATHETERIZATION N/A 06/10/2015   Procedure: Left Heart Cath and Coronary Angiography;  Surgeon: Peter M Swaziland, MD;  Location: [facility] INVASIVE CV LAB;  Service: Cardiovascular;  Laterality: N/A;  . CARDIAC CATHETERIZATION N/A 09/27/2015   Procedure: Left Heart Cath and Coronary Angiography;  Surgeon: Corky Crafts, MD;  Location: [facility] INVASIVE CV LAB;  Service: Cardiovascular;  Laterality: N/A;  . CARDIAC CATHETERIZATION N/A 09/27/2015   Procedure: Coronary Balloon Angioplasty;  Surgeon: Corky Crafts, MD;  Location: [facility] INVASIVE CV LAB;  Service: Cardiovascular;  Laterality: N/A;  . CORONARY ANGIOPLASTY WITH STENT PLACEMENT  02/05/2007   3.5x71mm Quantum non-DES to RCA (Dr. Nicki Guadalajara)  . FEMORAL ARTERY STENT    . LEFT AND RIGHT HEART CATHETERIZATION WITH CORONARY ANGIOGRAM N/A 01/14/2014   Procedure: LEFT AND RIGHT HEART CATHETERIZATION WITH CORONARY ANGIOGRAM;  Surgeon: Kathleene Hazel, MD;  Location: [facility] CATH LAB;  Service: Cardiovascular;  Laterality: N/A;  . LEFT HEART CATH AND CORONARY ANGIOGRAPHY N/A 07/12/2016   Procedure: Left Heart Cath and Coronary Angiography;  Surgeon: Swaziland,  Peter M, MD;  Location: [facility] INVASIVE CV LAB;  Service: Cardiovascular;  Laterality: N/A;  . LEFT HEART CATHETERIZATION WITH CORONARY ANGIOGRAM N/A 07/23/2013   Procedure: LEFT HEART CATHETERIZATION WITH CORONARY ANGIOGRAM;  Surgeon: Peter M Swaziland, MD;  Location: [facility] CATH LAB;  Service: Cardiovascular;  Laterality: N/A;       Home Medications    Prior to Admission medications   Medication Sig Start Date End Date Taking? Authorizing Provider  ARIPiprazole (ABILIFY) 15 MG tablet Take 1 tablet (15 mg total) by mouth daily. For mood control 10/26/16   Armandina Stammer I, NP  aspirin EC 81 MG tablet Take 81 mg daily by mouth.    [provider]  clopidogrel (PLAVIX) 75 MG tablet Take 1 tablet (75 mg total) daily by mouth. 11/25/16   Lennette Bihari, MD  lisinopril (PRINIVIL,ZESTRIL) 5 MG tablet Take 1 tablet (5 mg total) by mouth daily. For high blood pressure 10/26/16   Nwoko, Nicole Kindred I, NP  metoprolol succinate (TOPROL XL) 25 MG 24 hr tablet Take 0.5 tablets (12.5 mg  total) daily by mouth. 11/25/16   Lennette BihariKelly, Thomas A, MD  nicotine polacrilex (NICORETTE) 2 MG gum Take 1 each (2 mg total) by mouth as needed for smoking cessation. (May buy from over the counter): Smoking cessation 10/25/16   Armandina StammerNwoko, Agnes I, NP  nitroGLYCERIN (NITROSTAT) 0.4 MG SL tablet Place 1 tablet (0.4 mg total) under the tongue every 5 (five) minutes x 3 doses as needed for chest pain. 10/25/16   Armandina StammerNwoko, Agnes I, NP  ondansetron (ZOFRAN ODT) 4 MG disintegrating tablet Take 1 tablet (4 mg total) by mouth every 8 (eight) hours as needed. 02/19/17   Mabe, Latanya MaudlinMartha L, MD  promethazine (PHENERGAN) 25 MG tablet Take 1 tablet (25 mg total) by mouth every 6 (six) hours as needed for nausea or vomiting. 02/19/17   Phillis HaggisMabe, Martha L, MD  rosuvastatin (CRESTOR) 40 MG tablet Take 1 tablet (40 mg total) daily by mouth. 11/25/16 02/23/17  Lennette BihariKelly, Thomas A, MD  traZODone (DESYREL) 100 MG tablet Take 1 tablet (100 mg total) by mouth at bedtime as needed for  sleep. 10/25/16   Sanjuana KavaNwoko, Agnes I, NP    Family History Family History  Problem Relation Age of Onset  . CAD Mother   . Heart disease Mother   . Heart attack Mother   . Schizophrenia Mother   . CAD Sister     Social History Social History   Tobacco Use  . Smoking status: Current Every Day Smoker    Packs/day: 1.00    Years: 30.00    Pack years: 30.00    Types: Cigarettes  . Smokeless tobacco: Never Used  Substance Use Topics  . Alcohol use: Yes    Alcohol/week: 0.0 oz    Comment: 1 40 oz 2 times a week "cause my gf does"  . Drug use: Yes    Types: Marijuana    Comment: current user- daily one blunt      Allergies   Iodine; Shellfish allergy; and Contrast media [iodinated diagnostic agents]   Review of Systems Review of Systems  ROS reviewed and all otherwise negative except for mentioned in HPI   Physical Exam Updated Vital Signs BP 125/78   Pulse (!) 55   Temp 97.7 F (36.5 C) (Oral)   Resp 18   Ht 5\' 11"  (1.803 m)   Wt 56.7 kg (125 lb)   SpO2 96%   BMI 17.43 kg/m  Vitals reviewed Physical Exam  Physical Examination: General appearance - alert, well appearing, and in no distress Mental status - alert, oriented to person, place, and time Eyes - no conjunctival injection, no scleral icterus Mouth - mucous membranes moist, pharynx normal without lesions Neck - supple, no significant adenopathy Chest - clear to auscultation, no wheezes, rales or rhonchi, symmetric air entry Heart - normal rate, regular rhythm, normal S1, S2, no murmurs, rubs, clicks or gallops Abdomen - soft, nontender, nondistended, no masses or organomegaly, nabs Neurological - alert, oriented, normal speech,  Extremities - peripheral pulses normal, no pedal edema, no clubbing or cyanosis Skin - normal coloration and turgor, no rashes,    ED Treatments / Results  Labs (all labs ordered are listed, but only abnormal results are displayed) Labs Reviewed  COMPREHENSIVE METABOLIC  PANEL - Abnormal; Notable for the following components:      Result Value   Chloride 94 (*)    Glucose, Bld 138 (*)    Creatinine, Ser 1.47 (*)    Total Protein 8.2 (*)    GFR calc  non Af Amer 58 (*)    Anion gap 22 (*)    All other components within normal limits  CBC - Abnormal; Notable for the following components:   RDW 15.9 (*)    All other components within normal limits  URINALYSIS, ROUTINE W REFLEX MICROSCOPIC - Abnormal; Notable for the following components:   Color, Urine AMBER (*)    APPearance HAZY (*)    Specific Gravity, Urine 1.034 (*)    Glucose, UA 50 (*)    Bilirubin Urine SMALL (*)    Ketones, ur 80 (*)    Protein, ur 100 (*)    Squamous Epithelial / LPF 0-5 (*)    All other components within normal limits  LIPASE, BLOOD    EKG  EKG Interpretation  Date/Time:  Saturday February 19 2017 13:50:32 EST Ventricular Rate:  53 PR Interval:    QRS Duration: 118 QT Interval:  480 QTC Calculation: 451 R Axis:   72 Text Interpretation:  Sinus rhythm Biatrial enlargement Incomplete right bundle branch block ST elev, probable normal early repol pattern No significant change since last tracing Confirmed by Jerelyn Scott [phone]) on 02/19/2017 1:53:01 PM Also confirmed by Jerelyn Scott [phone]), editor Barbette Hair [phone])  on 02/19/2017 2:49:30 PM       Radiology No results found.  Procedures Procedures (including critical care time)  Medications Ordered in ED Medications  ondansetron (ZOFRAN-ODT) disintegrating tablet 4 mg (4 mg Oral Given 02/19/17 1113)  sodium chloride 0.9 % bolus 1,000 mL (0 mLs Intravenous Stopped 02/19/17 1445)  promethazine (PHENERGAN) injection 25 mg (25 mg Intravenous Given 02/19/17 1343)  sodium chloride 0.9 % bolus 1,000 mL (0 mLs Intravenous Stopped 02/19/17 1546)     Initial Impression / Assessment and Plan / ED Course  I have reviewed the triage vital signs and the nursing notes.  Pertinent labs & imaging results that were available  during my care of the patient were reviewed by me and considered in my medical decision making (see chart for details).     Patient presenting with complaint of nausea and vomiting.  He states he has not been able to keep down liquids for 2 days.  His abdominal exam is benign.  Labs revealed mild renal insufficiency.  He was treated with 2 L of normal saline and antiemetics.  He was able to tolerate p.o. fluids in the ED prior to discharge.  EKG was reassuring doubt ACS or acute abdominal process at this time.  Discharged with strict return precautions.  Pt agreeable with plan.  Final Clinical Impressions(s) / ED Diagnoses   Final diagnoses:  Non-intractable vomiting with nausea, unspecified vomiting type  Renal insufficiency    ED Discharge Orders        Ordered    ondansetron (ZOFRAN ODT) 4 MG disintegrating tablet  Every 8 hours PRN     02/19/17 1856    promethazine (PHENERGAN) 25 MG tablet  Every 6 hours PRN     02/19/17 1856       Phillis Haggis, MD 02/19/17 1958

## 2017-02-19 NOTE — ED Triage Notes (Signed)
Pt presents with 2 day h/o nausea, vomiting and diarrhea.  Pt reports epigastric pain x 2 days as well.

## 2017-02-19 NOTE — Discharge Instructions (Signed)
Return to the ED with any concerns including vomiting and not able to keep down liquids or your medications, abdominal pain especially if it localizes to the right lower abdomen, fever or chills, and decreased urine output, decreased level of alertness or lethargy, or any other alarming symptoms.    Your creatinine which is a measure of renal function is somewhat elevated, you should be sure to drink plenty of liquids and arrange for a followup visit with your primary care doctor for this lab value to re-checked

## 2017-02-19 NOTE — ED Notes (Signed)
Given sprite for PO challenge 

## 2017-02-20 ENCOUNTER — Encounter (HOSPITAL_COMMUNITY): Payer: Self-pay | Admitting: Pharmacy Technician

## 2017-02-20 ENCOUNTER — Emergency Department (HOSPITAL_COMMUNITY): Payer: Medicare Other

## 2017-02-20 ENCOUNTER — Inpatient Hospital Stay (HOSPITAL_COMMUNITY)
Admission: EM | Admit: 2017-02-20 | Discharge: 2017-02-28 | DRG: 326 | Disposition: A | Discharge status: Home Health | Payer: Medicare Other | Attending: General Surgery | Admitting: General Surgery

## 2017-02-20 ENCOUNTER — Encounter (HOSPITAL_COMMUNITY): Admission: EM | Disposition: A | Discharge status: Home / Self Care | Payer: Self-pay | Source: Home / Self Care

## 2017-02-20 ENCOUNTER — Emergency Department (HOSPITAL_COMMUNITY): Payer: Medicare Other | Admitting: Anesthesiology

## 2017-02-20 ENCOUNTER — Inpatient Hospital Stay (HOSPITAL_COMMUNITY): Payer: Medicare Other

## 2017-02-20 DIAGNOSIS — I252 Old myocardial infarction: Secondary | ICD-10-CM | POA: Diagnosis not present

## 2017-02-20 DIAGNOSIS — K256 Chronic or unspecified gastric ulcer with both hemorrhage and perforation: Secondary | ICD-10-CM | POA: Diagnosis present

## 2017-02-20 DIAGNOSIS — Z95828 Presence of other vascular implants and grafts: Secondary | ICD-10-CM

## 2017-02-20 DIAGNOSIS — I5022 Chronic systolic (congestive) heart failure: Secondary | ICD-10-CM | POA: Diagnosis present

## 2017-02-20 DIAGNOSIS — G4700 Insomnia, unspecified: Secondary | ICD-10-CM | POA: Diagnosis not present

## 2017-02-20 DIAGNOSIS — F17210 Nicotine dependence, cigarettes, uncomplicated: Secondary | ICD-10-CM | POA: Diagnosis present

## 2017-02-20 DIAGNOSIS — I351 Nonrheumatic aortic (valve) insufficiency: Secondary | ICD-10-CM | POA: Diagnosis not present

## 2017-02-20 DIAGNOSIS — K920 Hematemesis: Secondary | ICD-10-CM | POA: Diagnosis present

## 2017-02-20 DIAGNOSIS — Z91048 Other nonmedicinal substance allergy status: Secondary | ICD-10-CM

## 2017-02-20 DIAGNOSIS — F3130 Bipolar disorder, current episode depressed, mild or moderate severity, unspecified: Secondary | ICD-10-CM | POA: Diagnosis present

## 2017-02-20 DIAGNOSIS — I2510 Atherosclerotic heart disease of native coronary artery without angina pectoris: Secondary | ICD-10-CM | POA: Diagnosis present

## 2017-02-20 DIAGNOSIS — G92 Toxic encephalopathy: Secondary | ICD-10-CM | POA: Diagnosis not present

## 2017-02-20 DIAGNOSIS — Z8249 Family history of ischemic heart disease and other diseases of the circulatory system: Secondary | ICD-10-CM | POA: Diagnosis not present

## 2017-02-20 DIAGNOSIS — Z7982 Long term (current) use of aspirin: Secondary | ICD-10-CM | POA: Diagnosis not present

## 2017-02-20 DIAGNOSIS — Z91013 Allergy to seafood: Secondary | ICD-10-CM

## 2017-02-20 DIAGNOSIS — I255 Ischemic cardiomyopathy: Secondary | ICD-10-CM | POA: Diagnosis present

## 2017-02-20 DIAGNOSIS — T4275XA Adverse effect of unspecified antiepileptic and sedative-hypnotic drugs, initial encounter: Secondary | ICD-10-CM | POA: Diagnosis not present

## 2017-02-20 DIAGNOSIS — Z9114 Patient's other noncompliance with medication regimen: Secondary | ICD-10-CM

## 2017-02-20 DIAGNOSIS — E785 Hyperlipidemia, unspecified: Secondary | ICD-10-CM | POA: Diagnosis present

## 2017-02-20 DIAGNOSIS — I110 Hypertensive heart disease with heart failure: Secondary | ICD-10-CM | POA: Diagnosis present

## 2017-02-20 DIAGNOSIS — E876 Hypokalemia: Secondary | ICD-10-CM

## 2017-02-20 DIAGNOSIS — Z7902 Long term (current) use of antithrombotics/antiplatelets: Secondary | ICD-10-CM

## 2017-02-20 DIAGNOSIS — Z955 Presence of coronary angioplasty implant and graft: Secondary | ICD-10-CM

## 2017-02-20 DIAGNOSIS — J9690 Respiratory failure, unspecified, unspecified whether with hypoxia or hypercapnia: Secondary | ICD-10-CM

## 2017-02-20 DIAGNOSIS — J9601 Acute respiratory failure with hypoxia: Secondary | ICD-10-CM

## 2017-02-20 DIAGNOSIS — K259 Gastric ulcer, unspecified as acute or chronic, without hemorrhage or perforation: Secondary | ICD-10-CM

## 2017-02-20 DIAGNOSIS — Z91041 Radiographic dye allergy status: Secondary | ICD-10-CM

## 2017-02-20 DIAGNOSIS — K659 Peritonitis, unspecified: Secondary | ICD-10-CM | POA: Diagnosis present

## 2017-02-20 DIAGNOSIS — E874 Mixed disorder of acid-base balance: Secondary | ICD-10-CM | POA: Diagnosis present

## 2017-02-20 DIAGNOSIS — Z818 Family history of other mental and behavioral disorders: Secondary | ICD-10-CM

## 2017-02-20 DIAGNOSIS — F419 Anxiety disorder, unspecified: Secondary | ICD-10-CM | POA: Diagnosis present

## 2017-02-20 DIAGNOSIS — K251 Acute gastric ulcer with perforation: Secondary | ICD-10-CM | POA: Diagnosis not present

## 2017-02-20 DIAGNOSIS — F1210 Cannabis abuse, uncomplicated: Secondary | ICD-10-CM | POA: Diagnosis present

## 2017-02-20 DIAGNOSIS — F209 Schizophrenia, unspecified: Secondary | ICD-10-CM | POA: Diagnosis present

## 2017-02-20 DIAGNOSIS — N179 Acute kidney failure, unspecified: Secondary | ICD-10-CM | POA: Diagnosis present

## 2017-02-20 DIAGNOSIS — R1084 Generalized abdominal pain: Secondary | ICD-10-CM

## 2017-02-20 DIAGNOSIS — K650 Generalized (acute) peritonitis: Secondary | ICD-10-CM | POA: Diagnosis present

## 2017-02-20 DIAGNOSIS — D72819 Decreased white blood cell count, unspecified: Secondary | ICD-10-CM | POA: Diagnosis present

## 2017-02-20 DIAGNOSIS — K631 Perforation of intestine (nontraumatic): Secondary | ICD-10-CM

## 2017-02-20 DIAGNOSIS — Z978 Presence of other specified devices: Secondary | ICD-10-CM

## 2017-02-20 DIAGNOSIS — R34 Anuria and oliguria: Secondary | ICD-10-CM | POA: Diagnosis not present

## 2017-02-20 DIAGNOSIS — K255 Chronic or unspecified gastric ulcer with perforation: Secondary | ICD-10-CM | POA: Diagnosis present

## 2017-02-20 DIAGNOSIS — J9600 Acute respiratory failure, unspecified whether with hypoxia or hypercapnia: Secondary | ICD-10-CM | POA: Diagnosis not present

## 2017-02-20 HISTORY — PX: LAPAROTOMY: SHX154

## 2017-02-20 LAB — POCT I-STAT 7, (LYTES, BLD GAS, ICA,H+H)
ACID-BASE EXCESS: 1 mmol/L (ref 0.0–2.0)
Bicarbonate: 28.3 mmol/L — ABNORMAL HIGH (ref 20.0–28.0)
CALCIUM ION: 1.5 mmol/L — AB (ref 1.15–1.40)
HCT: 43 % (ref 39.0–52.0)
HEMOGLOBIN: 14.6 g/dL (ref 13.0–17.0)
O2 SAT: 100 %
PH ART: 7.342 — AB (ref 7.350–7.450)
PO2 ART: 618 mmHg — AB (ref 83.0–108.0)
POTASSIUM: 2.3 mmol/L — AB (ref 3.5–5.1)
SODIUM: 142 mmol/L (ref 135–145)
TCO2: 30 mmol/L (ref 22–32)
pCO2 arterial: 52.3 mmHg — ABNORMAL HIGH (ref 32.0–48.0)

## 2017-02-20 LAB — BASIC METABOLIC PANEL
Anion gap: 13 (ref 5–15)
BUN: 6 mg/dL (ref 6–20)
CHLORIDE: 104 mmol/L (ref 101–111)
CO2: 23 mmol/L (ref 22–32)
CREATININE: 1.08 mg/dL (ref 0.61–1.24)
Calcium: 9.1 mg/dL (ref 8.9–10.3)
GFR calc Af Amer: >60 mL/min (ref >60)
GFR calc non Af Amer: >60 mL/min (ref >60)
GLUCOSE: 164 mg/dL — AB (ref 65–99)
POTASSIUM: 2.8 mmol/L — AB (ref 3.5–5.1)
Sodium: 140 mmol/L (ref 135–145)

## 2017-02-20 LAB — COMPREHENSIVE METABOLIC PANEL
ALK PHOS: 69 U/L (ref 38–126)
ALT: 22 U/L (ref 17–63)
ANION GAP: 19 — AB (ref 5–15)
AST: 38 U/L (ref 15–41)
Albumin: 4.7 g/dL (ref 3.5–5.0)
BUN: 6 mg/dL (ref 6–20)
CALCIUM: 10.2 mg/dL (ref 8.9–10.3)
CHLORIDE: 94 mmol/L — AB (ref 101–111)
CO2: 25 mmol/L (ref 22–32)
CREATININE: 1.34 mg/dL — AB (ref 0.61–1.24)
Glucose, Bld: 161 mg/dL — ABNORMAL HIGH (ref 65–99)
Potassium: 3.8 mmol/L (ref 3.5–5.1)
Sodium: 138 mmol/L (ref 135–145)
Total Bilirubin: 1.6 mg/dL — ABNORMAL HIGH (ref 0.3–1.2)
Total Protein: 8 g/dL (ref 6.5–8.1)

## 2017-02-20 LAB — CBC WITH DIFFERENTIAL/PLATELET
BASOS PCT: 0 %
Basophils Absolute: 0 10*3/uL (ref 0.0–0.1)
EOS ABS: 0 10*3/uL (ref 0.0–0.7)
Eosinophils Relative: 0 %
HEMATOCRIT: 51 % (ref 39.0–52.0)
HEMOGLOBIN: 18.2 g/dL — AB (ref 13.0–17.0)
Lymphocytes Relative: 15 %
Lymphs Abs: 0.8 10*3/uL (ref 0.7–4.0)
MCH: 31.9 pg (ref 26.0–34.0)
MCHC: 35.7 g/dL (ref 30.0–36.0)
MCV: 89.5 fL (ref 78.0–100.0)
Monocytes Absolute: 0.2 10*3/uL (ref 0.1–1.0)
Monocytes Relative: 5 %
NEUTROS ABS: 4.1 10*3/uL (ref 1.7–7.7)
NEUTROS PCT: 80 %
Platelets: 297 10*3/uL (ref 150–400)
RBC: 5.7 MIL/uL (ref 4.22–5.81)
RDW: 16.1 % — ABNORMAL HIGH (ref 11.5–15.5)
WBC: 5.1 10*3/uL (ref 4.0–10.5)

## 2017-02-20 LAB — POCT I-STAT 3, ART BLOOD GAS (G3+)
ACID-BASE EXCESS: 2 mmol/L (ref 0.0–2.0)
Bicarbonate: 28.8 mmol/L — ABNORMAL HIGH (ref 20.0–28.0)
O2 SAT: 100 %
PCO2 ART: 48.7 mmHg — AB (ref 32.0–48.0)
PH ART: 7.378 (ref 7.350–7.450)
PO2 ART: 208 mmHg — AB (ref 83.0–108.0)
Patient temperature: 97.5
TCO2: 30 mmol/L (ref 22–32)

## 2017-02-20 LAB — RAPID URINE DRUG SCREEN, HOSP PERFORMED
AMPHETAMINES: NOT DETECTED
BENZODIAZEPINES: POSITIVE — AB
Barbiturates: NOT DETECTED
COCAINE: NOT DETECTED
Opiates: NOT DETECTED
Tetrahydrocannabinol: POSITIVE — AB

## 2017-02-20 LAB — PROTIME-INR
INR: 1.74
PROTHROMBIN TIME: 20.2 s — AB (ref 11.4–15.2)

## 2017-02-20 LAB — CBC
HEMATOCRIT: 39.7 % (ref 39.0–52.0)
HEMOGLOBIN: 13.7 g/dL (ref 13.0–17.0)
MCH: 30.8 pg (ref 26.0–34.0)
MCHC: 34.5 g/dL (ref 30.0–36.0)
MCV: 89.2 fL (ref 78.0–100.0)
Platelets: 218 10*3/uL (ref 150–400)
RBC: 4.45 MIL/uL (ref 4.22–5.81)
RDW: 15.5 % (ref 11.5–15.5)
WBC: 2.5 10*3/uL — ABNORMAL LOW (ref 4.0–10.5)

## 2017-02-20 LAB — MAGNESIUM: Magnesium: 1.3 mg/dL — ABNORMAL LOW (ref 1.7–2.4)

## 2017-02-20 LAB — PHOSPHORUS: Phosphorus: 4.5 mg/dL (ref 2.5–4.6)

## 2017-02-20 LAB — PROCALCITONIN: PROCALCITONIN: 3.69 ng/mL

## 2017-02-20 LAB — GLUCOSE, CAPILLARY: GLUCOSE-CAPILLARY: 91 mg/dL (ref 65–99)

## 2017-02-20 LAB — LIPASE, BLOOD: LIPASE: 23 U/L (ref 11–51)

## 2017-02-20 LAB — TRIGLYCERIDES: TRIGLYCERIDES: 52 mg/dL (ref <150)

## 2017-02-20 LAB — I-STAT TROPONIN, ED: TROPONIN I, POC: 0 ng/mL (ref 0.00–0.08)

## 2017-02-20 SURGERY — LAPAROTOMY, EXPLORATORY
Anesthesia: General | Site: Abdomen

## 2017-02-20 MED ORDER — FENTANYL CITRATE (PF) 100 MCG/2ML IJ SOLN
INTRAMUSCULAR | Status: DC | PRN
  Administered 2017-02-20 (×4): 50 ug via INTRAVENOUS
  Administered 2017-02-20 (×2): 100 ug via INTRAVENOUS
  Administered 2017-02-20: 50 ug via INTRAVENOUS

## 2017-02-20 MED ORDER — ACETAMINOPHEN 650 MG RE SUPP
650.0000 mg | Freq: Four times a day (QID) | RECTAL | Status: DC | PRN
  Administered 2017-02-23: 650 mg via RECTAL
  Filled 2017-02-20: qty 1

## 2017-02-20 MED ORDER — SUCCINYLCHOLINE CHLORIDE 20 MG/ML IJ SOLN
INTRAMUSCULAR | Status: DC | PRN
  Administered 2017-02-20: 100 mg via INTRAVENOUS

## 2017-02-20 MED ORDER — PIPERACILLIN-TAZOBACTAM 3.375 G IVPB
3.3750 g | Freq: Three times a day (TID) | INTRAVENOUS | Status: DC
  Administered 2017-02-21 – 2017-02-26 (×16): 3.375 g via INTRAVENOUS
  Filled 2017-02-20 (×18): qty 50

## 2017-02-20 MED ORDER — POTASSIUM CHLORIDE 10 MEQ/100ML IV SOLN
10.0000 meq | INTRAVENOUS | Status: AC
  Administered 2017-02-20 – 2017-02-21 (×2): 10 meq via INTRAVENOUS

## 2017-02-20 MED ORDER — HYDROCORTISONE NA SUCCINATE PF 250 MG IJ SOLR
200.0000 mg | Freq: Once | INTRAMUSCULAR | Status: DC
  Filled 2017-02-20: qty 200

## 2017-02-20 MED ORDER — PIPERACILLIN-TAZOBACTAM 3.375 G IVPB
3.3750 g | Freq: Three times a day (TID) | INTRAVENOUS | Status: DC
  Filled 2017-02-20: qty 50

## 2017-02-20 MED ORDER — ONDANSETRON HCL 4 MG/2ML IJ SOLN
4.0000 mg | Freq: Once | INTRAMUSCULAR | Status: AC
  Administered 2017-02-20: 4 mg via INTRAVENOUS
  Filled 2017-02-20: qty 2

## 2017-02-20 MED ORDER — PANTOPRAZOLE SODIUM 40 MG IV SOLR
40.0000 mg | Freq: Two times a day (BID) | INTRAVENOUS | Status: DC
  Administered 2017-02-20 – 2017-02-28 (×16): 40 mg via INTRAVENOUS
  Filled 2017-02-20 (×16): qty 40

## 2017-02-20 MED ORDER — FENTANYL CITRATE (PF) 250 MCG/5ML IJ SOLN
INTRAMUSCULAR | Status: AC
  Filled 2017-02-20: qty 5

## 2017-02-20 MED ORDER — FENTANYL 2500MCG IN NS 250ML (10MCG/ML) PREMIX INFUSION
0.0000 ug/h | INTRAVENOUS | Status: DC
  Administered 2017-02-20: 150 ug/h via INTRAVENOUS
  Administered 2017-02-21: 200 ug/h via INTRAVENOUS
  Filled 2017-02-20 (×2): qty 250

## 2017-02-20 MED ORDER — ESMOLOL HCL 100 MG/10ML IV SOLN
INTRAVENOUS | Status: DC | PRN
  Administered 2017-02-20: 20 mg via INTRAVENOUS

## 2017-02-20 MED ORDER — POTASSIUM CHLORIDE 10 MEQ/100ML IV SOLN
10.0000 meq | INTRAVENOUS | Status: AC
  Administered 2017-02-20 – 2017-02-21 (×4): 10 meq via INTRAVENOUS
  Filled 2017-02-20 (×2): qty 100

## 2017-02-20 MED ORDER — NICARDIPINE HCL IN NACL 20-0.86 MG/200ML-% IV SOLN
3.0000 mg/h | INTRAVENOUS | Status: DC
  Administered 2017-02-20: 5 mg/h via INTRAVENOUS
  Filled 2017-02-20: qty 200

## 2017-02-20 MED ORDER — VASOPRESSIN 20 UNIT/ML IV SOLN
INTRAVENOUS | Status: AC
  Filled 2017-02-20: qty 1

## 2017-02-20 MED ORDER — LACTATED RINGERS IV SOLN
INTRAVENOUS | Status: DC | PRN
  Administered 2017-02-20 (×3): via INTRAVENOUS

## 2017-02-20 MED ORDER — 0.9 % SODIUM CHLORIDE (POUR BTL) OPTIME
TOPICAL | Status: DC | PRN
  Administered 2017-02-20 (×4): 1000 mL

## 2017-02-20 MED ORDER — POTASSIUM CHLORIDE 10 MEQ/50ML IV SOLN
10.0000 meq | INTRAVENOUS | Status: DC
  Administered 2017-02-20: 10 meq via INTRAVENOUS
  Filled 2017-02-20: qty 50

## 2017-02-20 MED ORDER — METOPROLOL TARTRATE 5 MG/5ML IV SOLN: 5.0000 mg | Freq: Four times a day (QID) | INTRAVENOUS | Status: DC | PRN

## 2017-02-20 MED ORDER — CALCIUM CHLORIDE 10 % IV SOLN
INTRAVENOUS | Status: DC | PRN
  Administered 2017-02-20: 1 g via INTRAVENOUS

## 2017-02-20 MED ORDER — ACETAMINOPHEN 325 MG PO TABS: 650.0000 mg | ORAL_TABLET | Freq: Four times a day (QID) | ORAL | Status: DC | PRN

## 2017-02-20 MED ORDER — PHENYLEPHRINE HCL 10 MG/ML IJ SOLN
INTRAMUSCULAR | Status: DC | PRN
  Administered 2017-02-20: 50 ug/min via INTRAVENOUS

## 2017-02-20 MED ORDER — MAGNESIUM SULFATE 2 GM/50ML IV SOLN
2.0000 g | Freq: Once | INTRAVENOUS | Status: AC
  Administered 2017-02-20: 2 g via INTRAVENOUS
  Filled 2017-02-20: qty 50

## 2017-02-20 MED ORDER — PROPOFOL 10 MG/ML IV BOLUS
INTRAVENOUS | Status: AC
  Filled 2017-02-20: qty 20

## 2017-02-20 MED ORDER — SODIUM CHLORIDE 0.9 % IV SOLN
INTRAVENOUS | Status: DC
  Administered 2017-02-20: 21:00:00 via INTRAVENOUS

## 2017-02-20 MED ORDER — BISACODYL 10 MG RE SUPP
10.0000 mg | Freq: Every day | RECTAL | Status: DC | PRN
Start: 2017-02-20 — End: 2017-02-28

## 2017-02-20 MED ORDER — DIPHENHYDRAMINE HCL 50 MG/ML IJ SOLN: 50.0000 mg | Freq: Once | INTRAMUSCULAR | Status: DC

## 2017-02-20 MED ORDER — SODIUM CHLORIDE 0.9 % IV BOLUS (SEPSIS)
1000.0000 mL | Freq: Once | INTRAVENOUS | Status: AC
  Administered 2017-02-20: 1000 mL via INTRAVENOUS

## 2017-02-20 MED ORDER — LACTATED RINGERS IV SOLN
INTRAVENOUS | Status: DC
  Administered 2017-02-20: 22:00:00 via INTRAVENOUS

## 2017-02-20 MED ORDER — LACTATED RINGERS IV BOLUS (SEPSIS)
1000.0000 mL | Freq: Once | INTRAVENOUS | Status: AC
  Administered 2017-02-20: 1000 mL via INTRAVENOUS

## 2017-02-20 MED ORDER — VASOPRESSIN 20 UNIT/ML IV SOLN
INTRAVENOUS | Status: DC | PRN
  Administered 2017-02-20 (×2): 1 [IU] via INTRAVENOUS

## 2017-02-20 MED ORDER — ONDANSETRON 4 MG PO TBDP: 4.0000 mg | ORAL_TABLET | Freq: Four times a day (QID) | ORAL | Status: DC | PRN

## 2017-02-20 MED ORDER — MAGNESIUM SULFATE 2 GM/50ML IV SOLN: 2.0000 g | Freq: Once | INTRAVENOUS | Status: DC

## 2017-02-20 MED ORDER — LIDOCAINE HCL (CARDIAC) 20 MG/ML IV SOLN
INTRAVENOUS | Status: DC | PRN
  Administered 2017-02-20: 100 mg via INTRAVENOUS

## 2017-02-20 MED ORDER — PROPOFOL 500 MG/50ML IV EMUL
INTRAVENOUS | Status: DC | PRN
  Administered 2017-02-20: 30 ug/kg/min via INTRAVENOUS

## 2017-02-20 MED ORDER — PROPOFOL 10 MG/ML IV BOLUS
INTRAVENOUS | Status: DC | PRN
  Administered 2017-02-20: 110 mg via INTRAVENOUS

## 2017-02-20 MED ORDER — MIDAZOLAM HCL 5 MG/5ML IJ SOLN
INTRAMUSCULAR | Status: DC | PRN
  Administered 2017-02-20 (×2): 1 mg via INTRAVENOUS

## 2017-02-20 MED ORDER — PROPOFOL 1000 MG/100ML IV EMUL
0.0000 ug/kg/min | INTRAVENOUS | Status: DC
  Administered 2017-02-20: 30 ug/kg/min via INTRAVENOUS
  Filled 2017-02-20 (×2): qty 100

## 2017-02-20 MED ORDER — ONDANSETRON HCL 4 MG/2ML IJ SOLN
4.0000 mg | Freq: Four times a day (QID) | INTRAMUSCULAR | Status: DC | PRN
  Administered 2017-02-21: 4 mg via INTRAVENOUS
  Filled 2017-02-20: qty 2

## 2017-02-20 MED ORDER — ORAL CARE MOUTH RINSE
15.0000 mL | Freq: Four times a day (QID) | OROMUCOSAL | Status: DC
  Administered 2017-02-21 (×2): 15 mL via OROMUCOSAL

## 2017-02-20 MED ORDER — MIDAZOLAM HCL 2 MG/2ML IJ SOLN
INTRAMUSCULAR | Status: AC
  Filled 2017-02-20: qty 2

## 2017-02-20 MED ORDER — ROCURONIUM BROMIDE 100 MG/10ML IV SOLN
INTRAVENOUS | Status: DC | PRN
  Administered 2017-02-20: 50 mg via INTRAVENOUS

## 2017-02-20 MED ORDER — CHLORHEXIDINE GLUCONATE 0.12% ORAL RINSE (MEDLINE KIT)
15.0000 mL | Freq: Two times a day (BID) | OROMUCOSAL | Status: DC
  Administered 2017-02-20 – 2017-02-21 (×2): 15 mL via OROMUCOSAL

## 2017-02-20 MED ORDER — FENTANYL CITRATE (PF) 100 MCG/2ML IJ SOLN
100.0000 ug | INTRAMUSCULAR | Status: DC | PRN
  Administered 2017-02-20: 100 ug via INTRAVENOUS
  Filled 2017-02-20: qty 2

## 2017-02-20 MED ORDER — PIPERACILLIN-TAZOBACTAM 3.375 G IVPB 30 MIN
3.3750 g | Freq: Once | INTRAVENOUS | Status: AC
  Administered 2017-02-20: 3.375 g via INTRAVENOUS
  Filled 2017-02-20: qty 50

## 2017-02-20 MED ORDER — DIPHENHYDRAMINE HCL 25 MG PO CAPS: 50.0000 mg | ORAL_CAPSULE | Freq: Once | ORAL | Status: DC

## 2017-02-20 MED ORDER — FENTANYL CITRATE (PF) 100 MCG/2ML IJ SOLN
100.0000 ug | INTRAMUSCULAR | Status: DC | PRN
  Administered 2017-02-21 – 2017-02-22 (×6): 100 ug via INTRAVENOUS
  Filled 2017-02-20 (×7): qty 2

## 2017-02-20 MED ORDER — THIAMINE HCL 100 MG/ML IJ SOLN
100.0000 mg | Freq: Every day | INTRAMUSCULAR | Status: DC
  Administered 2017-02-21 – 2017-02-28 (×8): 100 mg via INTRAVENOUS
  Filled 2017-02-20 (×8): qty 2

## 2017-02-20 MED ORDER — FENTANYL CITRATE (PF) 100 MCG/2ML IJ SOLN: 25.0000 ug | INTRAMUSCULAR | Status: DC | PRN

## 2017-02-20 MED ORDER — ALBUMIN HUMAN 5 % IV SOLN
INTRAVENOUS | Status: DC | PRN
  Administered 2017-02-20 (×3): via INTRAVENOUS

## 2017-02-20 MED ORDER — FENTANYL CITRATE (PF) 100 MCG/2ML IJ SOLN
50.0000 ug | Freq: Once | INTRAMUSCULAR | Status: AC
  Administered 2017-02-20: 50 ug via INTRAVENOUS
  Filled 2017-02-20: qty 2

## 2017-02-20 MED ORDER — PHENYLEPHRINE HCL 10 MG/ML IJ SOLN
INTRAMUSCULAR | Status: DC | PRN
  Administered 2017-02-20 (×2): 120 ug via INTRAVENOUS
  Administered 2017-02-20: 160 ug via INTRAVENOUS

## 2017-02-20 SURGICAL SUPPLY — 53 items
BAG URINE DRAINAGE (UROLOGICAL SUPPLIES) ×3 IMPLANT
BIOPATCH WHT 1IN DISK W/4.0 H (GAUZE/BANDAGES/DRESSINGS) ×3 IMPLANT
BLADE CLIPPER SURG (BLADE) ×3 IMPLANT
CANISTER SUCT 3000ML PPV (MISCELLANEOUS) ×3 IMPLANT
CHLORAPREP W/TINT 26ML (MISCELLANEOUS) ×3 IMPLANT
COVER SURGICAL LIGHT HANDLE (MISCELLANEOUS) ×3 IMPLANT
DRAPE LAPAROSCOPIC ABDOMINAL (DRAPES) ×3 IMPLANT
DRAPE WARM FLUID 44X44 (DRAPE) ×3 IMPLANT
DRSG OPSITE POSTOP 4X10 (GAUZE/BANDAGES/DRESSINGS) IMPLANT
DRSG OPSITE POSTOP 4X8 (GAUZE/BANDAGES/DRESSINGS) IMPLANT
DRSG TEGADERM 2-3/8X2-3/4 SM (GAUZE/BANDAGES/DRESSINGS) ×6 IMPLANT
ELECT BLADE 6.5 EXT (BLADE) IMPLANT
ELECT CAUTERY BLADE 6.4 (BLADE) ×3 IMPLANT
ELECT REM PT RETURN 9FT ADLT (ELECTROSURGICAL) ×3
ELECTRODE REM PT RTRN 9FT ADLT (ELECTROSURGICAL) ×1 IMPLANT
EVACUATOR SILICONE 100CC (DRAIN) ×3 IMPLANT
GAUZE SPONGE 4X4 12PLY STRL (GAUZE/BANDAGES/DRESSINGS) ×3 IMPLANT
GAUZE SPONGE 4X4 12PLY STRL LF (GAUZE/BANDAGES/DRESSINGS) ×3 IMPLANT
GLOVE BIO SURGEON STRL SZ7 (GLOVE) ×3 IMPLANT
GLOVE BIOGEL PI IND STRL 7.0 (GLOVE) ×1 IMPLANT
GLOVE BIOGEL PI IND STRL 7.5 (GLOVE) ×1 IMPLANT
GLOVE BIOGEL PI INDICATOR 7.0 (GLOVE) ×2
GLOVE BIOGEL PI INDICATOR 7.5 (GLOVE) ×2
GLOVE SURG SS PI 7.0 STRL IVOR (GLOVE) ×3 IMPLANT
GOWN STRL REUS W/ TWL LRG LVL3 (GOWN DISPOSABLE) ×2 IMPLANT
GOWN STRL REUS W/TWL LRG LVL3 (GOWN DISPOSABLE) ×4
KIT BASIN OR (CUSTOM PROCEDURE TRAY) ×3 IMPLANT
KIT ROOM TURNOVER OR (KITS) ×3 IMPLANT
LIGASURE IMPACT 36 18CM CVD LR (INSTRUMENTS) IMPLANT
NS IRRIG 1000ML POUR BTL (IV SOLUTION) ×6 IMPLANT
PACK GENERAL/GYN (CUSTOM PROCEDURE TRAY) ×3 IMPLANT
PAD ARMBOARD 7.5X6 YLW CONV (MISCELLANEOUS) ×3 IMPLANT
SPECIMEN JAR LARGE (MISCELLANEOUS) IMPLANT
SPONGE LAP 18X18 X RAY DECT (DISPOSABLE) IMPLANT
STAPLER VISISTAT 35W (STAPLE) ×6 IMPLANT
SUCTION POOLE TIP (SUCTIONS) ×3 IMPLANT
SUT ETHILON 2 0 FS 18 (SUTURE) ×12 IMPLANT
SUT PDS AB 1 TP1 96 (SUTURE) ×12 IMPLANT
SUT SILK 2 0 (SUTURE) ×4
SUT SILK 2 0 SH CR/8 (SUTURE) ×6 IMPLANT
SUT SILK 2 0SH CR/8 30 (SUTURE) ×6 IMPLANT
SUT SILK 2-0 18XBRD TIE 12 (SUTURE) ×2 IMPLANT
SUT SILK 3 0 (SUTURE) ×2
SUT SILK 3 0 SH CR/8 (SUTURE) ×6 IMPLANT
SUT SILK 3-0 18XBRD TIE 12 (SUTURE) ×1 IMPLANT
SUT VIC AB 3-0 SH 27 (SUTURE) ×2
SUT VIC AB 3-0 SH 27X BRD (SUTURE) ×1 IMPLANT
SYR 20ML ECCENTRIC (SYRINGE) ×3 IMPLANT
TOWEL OR 17X26 10 PK STRL BLUE (TOWEL DISPOSABLE) ×3 IMPLANT
TRAY FOLEY W/METER SILVER 16FR (SET/KITS/TRAYS/PACK) ×3 IMPLANT
TUBE GASTROSTOMY 18F (CATHETERS) ×3 IMPLANT
TUBE MIC GASTROSTOMY 16FR (TUBING) IMPLANT
YANKAUER SUCT BULB TIP NO VENT (SUCTIONS) IMPLANT

## 2017-02-20 NOTE — Progress Notes (Signed)
Pharmacy Antibiotic Note [name] is a 42 y.o. male admitted on 02/20/2017 with IAA/bowel perforation. Pharmacy has been consulted for Zosyn dosing.  Plan: Zosyn 3.375g IV q8h (4 hour infusion).  Temp (24hrs), Avg:97.5 F (36.4 C), Min:97.5 F (36.4 C), Max:97.5 F (36.4 C)  Recent Labs  Lab 02/19/17 1114 02/20/17 1639  WBC 4.8 5.1  CREATININE 1.47* 1.34*    Estimated Creatinine Clearance: 59.4 mL/min (A) (by C-G formula based on SCr of 1.34 mg/dL (H)).    Allergies  Allergen Reactions  . Iodine Anaphylaxis and Swelling  . Shellfish Allergy Anaphylaxis    All shellfish  . Contrast Media [Iodinated Diagnostic Agents] Nausea And Vomiting    Antimicrobials this admission: 2/10 Zosyn >>    Thank you for allowing pharmacy to be a part of this patient's care.  [name] 02/20/2017 6:17 PM

## 2017-02-20 NOTE — ED Notes (Signed)
Pt diaphoretic, appears extremely uncomfortable

## 2017-02-20 NOTE — Consult Note (Signed)
PULMONARY / CRITICAL CARE MEDICINE   Name: [name] MRN: [id number] DOB: Jun 27, 1975    ADMISSION DATE:  02/20/2017 CONSULTATION DATE:  02/20/2017  REFERRING MD:  [facility]  CHIEF COMPLAINT:  Perforated Gastric Ulcer  HISTORY OF PRESENT ILLNESS:   HPI obtained from medical chart review as patient is currently intubated and sedated.  42 year old male with past medical history as below significant for but not limited to bipolar disorder, CAD with multiple stents, tobacco abuse, illicit drug abuse-marijuana, hypertension, and cardiomyopathy admitted on 2/10 by [facility].    Patient was seen in the ER on 2/9 with nausea, vomiting, diarrhea, and abdominal pain and discharged home.  He returned 2/10 with worsening pain with hematemesis.  Abdominal CT showed free air and exam consistent with acute peritonitis.  He was taken to the OR by surgery for exploratory lap and found to have perforated peptic ulcer with placement of gastrostomy tube.   He returns to the ICU to remain intubated and sedated overnight.  PCCM consulted for ventilator management.    PAST MEDICAL HISTORY :  He  has a past medical history of Anxiety, Bipolar 1 disorder (HCC), CAD S/P percutaneous coronary angioplasty, medication noncompliance, Hyperlipidemia, Hypertensive heart disease, Ischemic cardiomyopathy, Myocardial infarction [facility]), NSVT (nonsustained ventricular tachycardia) (HCC), Polysubstance abuse (HCC), Schizophrenia (HCC), and Tobacco abuse.  PAST SURGICAL HISTORY: He  has a past surgical history that includes Femoral artery stent; Coronary angioplasty with stent (02/05/2007); Cardiac catheterization (01/16/2009); Cardiac catheterization (07/23/2013); left heart catheterization with coronary angiogram (N/A, 07/23/2013); left and right heart catheterization with coronary angiogram (N/A, 01/14/2014); Cardiac catheterization (N/A, 01/10/2015); Cardiac catheterization (N/A, 01/10/2015); Cardiac catheterization (N/A, 06/10/2015); Cardiac  catheterization (N/A, 09/27/2015); Cardiac catheterization (N/A, 09/27/2015); and LEFT HEART CATH AND CORONARY ANGIOGRAPHY (N/A, 07/12/2016).  Allergies  Allergen Reactions  . Iodine Anaphylaxis and Swelling  . Shellfish Allergy Anaphylaxis    All shellfish  . Contrast Media [Iodinated Diagnostic Agents] Nausea And Vomiting    No current facility-administered medications on file prior to encounter.    Current Outpatient Medications on File Prior to Encounter  Medication Sig  . ARIPiprazole (ABILIFY) 15 MG tablet Take 1 tablet (15 mg total) by mouth daily. For mood control  . aspirin EC 81 MG tablet Take 81 mg daily by mouth.  . clopidogrel (PLAVIX) 75 MG tablet Take 1 tablet (75 mg total) daily by mouth.  Marland Kitchen lisinopril (PRINIVIL,ZESTRIL) 5 MG tablet Take 1 tablet (5 mg total) by mouth daily. For high blood pressure  . metoprolol succinate (TOPROL XL) 25 MG 24 hr tablet Take 0.5 tablets (12.5 mg total) daily by mouth.  . nicotine polacrilex (NICORETTE) 2 MG gum Take 1 each (2 mg total) by mouth as needed for smoking cessation. (May buy from over the counter): Smoking cessation  . nitroGLYCERIN (NITROSTAT) 0.4 MG SL tablet Place 1 tablet (0.4 mg total) under the tongue every 5 (five) minutes x 3 doses as needed for chest pain.  Marland Kitchen ondansetron (ZOFRAN ODT) 4 MG disintegrating tablet Take 1 tablet (4 mg total) by mouth every 8 (eight) hours as needed.  . promethazine (PHENERGAN) 25 MG tablet Take 1 tablet (25 mg total) by mouth every 6 (six) hours as needed for nausea or vomiting.  . rosuvastatin (CRESTOR) 40 MG tablet Take 1 tablet (40 mg total) daily by mouth.  . traZODone (DESYREL) 100 MG tablet Take 1 tablet (100 mg total) by mouth at bedtime as needed for sleep.    FAMILY HISTORY:  His indicated that his  mother is deceased. He indicated that his father is alive. He indicated that his sister is alive.   SOCIAL HISTORY: He  reports that he has been smoking cigarettes.  He has a 30.00  pack-year smoking history. he has never used smokeless tobacco. He reports that he drinks alcohol. He reports that he uses drugs. Drug: Marijuana.  REVIEW OF SYSTEMS:   Unable  SUBJECTIVE:  Propofol at 30 mcg/kg/hr Hypertensive RN reports initial rectal temp 92, now on bairhugger and emptied JP drain x 3 since arriving- surgery aware  VITAL SIGNS: BP (!) 194/124   Pulse 79   Temp (!) 97.5 F (36.4 C) (Axillary)   Resp 15   Ht 5\' 11"  (1.803 m)   SpO2 100%   BMI 17.43 kg/m   HEMODYNAMICS:    VENTILATOR SETTINGS: Vent Mode: PRVC FiO2 (%):  [40 %] 40 % Set Rate:  [15 bmp] 15 bmp Vt Set:  [600 mL] 600 mL PEEP:  [5 cmH20] 5 cmH20 Plateau Pressure:  [10 cmH20] 10 cmH20  INTAKE / OUTPUT: No intake/output data recorded.  PHYSICAL EXAMINATION: General:  Critically ill thin adult male, intubated and on mechanical ventilation in NAD HEENT: Pupils 3/reactive, sclerae anicteric, ETT 7.5 cm at 24 at lip, OGT Neuro: Opens eyes to verbal, f/c, MAE CV: s1s2 rrr, no m/r/g PULM: even/non-labored on MV, lungs bilaterally clear GI: thin, midline dressing- bloody saturated but not extending markings, LUQ Gtube to straight drain- no drainage, right JP drain with sanguineous output, no appreciable BS Extremities: warm/dry, no edema  Skin: no rashes    LABS:  BMET Recent Labs  Lab 02/19/17 1114 02/20/17 1639 02/20/17 1948  NA 138 138 142  K 4.2 3.8 2.3*  CL 94* 94*  --   CO2 22 25  --   BUN 9 6  --   CREATININE 1.47* 1.34*  --   GLUCOSE 138* 161*  --     Electrolytes Recent Labs  Lab 02/19/17 1114 02/20/17 1639  CALCIUM 10.0 10.2    CBC Recent Labs  Lab 02/19/17 1114 02/20/17 1639 02/20/17 1948  WBC 4.8 5.1  --   HGB 15.9 18.2* 14.6  HCT 45.7 51.0 43.0  PLT 270 297  --     Coag's No results for input(s): APTT, INR in the last 168 hours.  Sepsis Markers No results for input(s): LATICACIDVEN, PROCALCITON, O2SATVEN in the last 168 hours.  ABG Recent Labs   Lab 02/20/17 1948  PHART 7.342*  PCO2ART 52.3*  PO2ART 618.0*    Liver Enzymes Recent Labs  Lab 02/19/17 1114 02/20/17 1639  AST 26 38  ALT 20 22  ALKPHOS 82 69  BILITOT 1.2 1.6*  ALBUMIN 4.5 4.7    Cardiac Enzymes No results for input(s): TROPONINI, PROBNP in the last 168 hours.  Glucose No results for input(s): GLUCAP in the last 168 hours.  Imaging Ct Abdomen Pelvis Wo Contrast  Result Date: 02/20/2017 CLINICAL DATA:  Nausea vomiting, abdominal pain EXAM: CT ABDOMEN AND PELVIS WITHOUT CONTRAST TECHNIQUE: Multidetector CT imaging of the abdomen and pelvis was performed following the standard protocol without IV contrast. COMPARISON:  CT 06/09/2016 FINDINGS: Lower chest: Lung bases are clear. Hepatobiliary: No focal hepatic lesion. There is extraluminal gas along the inferior margin of the LEFT hepatic lobe and anterior to LEFT hepatic lobe consistent intraperitoneal free air (image 12, series 5). No pneumobilia. No portal venous gas. Pancreas: Pancreas is normal. No ductal dilatation. No pancreatic inflammation. Spleen: Normal spleen Adrenals/urinary tract: Adrenal  glands and kidneys are normal. The ureters and bladder normal. Stomach/Bowel: GI tract is somewhat difficult to evaluate without IV oral contrast. The stomach appears normal. There is extraluminal gas in the gastro hepatic ligament (image 16, series 3). There is extraluminal gas along the second portion the duodenum. This could represent a source of the perforation (image 29, series 6). There is a moderate volume of free fluid in the upper abdomen and lower pelvis which does not localize source. Fluid is low-density There is a thickened loop of small bowel in the LEFT mid abdomen with a 13 mm wall (image 27, series 6). This also could represent source of perforation Cecum is normal. The appendix is not clearly identified. Ascending and transverse colon appear normal. The descending colon and rectosigmoid colon are normal.  Vascular/Lymphatic: Abdominal aorta is normal caliber with atherosclerotic calcification. There is no retroperitoneal or periportal lymphadenopathy. No pelvic lymphadenopathy. Reproductive: Prostate normal for age calcifications Seminal vesicles Other: Moderate free fluid Musculoskeletal: No aggressive osseous lesion. IMPRESSION: 1. Intraperitoneal free air and free fluid consistent with bowel perforation. Favor perforation to either be along the second portion duodenum or in the mid small bowel of the LEFT abdomen. There is thickened loops of small bowel in the LEFT abdomen. There is extraluminal gas along the second portion of the duodenum. 2. Exam is somewhat difficult to localize perforation source as no IV oral contrast. 3. Vascular calcifications are advanced for age. 4. Recommend surgical consultation. Critical Value/emergent results were called by telephone at the time of interpretation on 02/20/2017 at 5:42 pm to Dr. Corlis LeakMACKUEN, Mitzi HansenOURTENEY, LYN, who verbally acknowledged these results. Electronically Signed   By: Genevive BiStewart  Edmunds M.D.   On: 02/20/2017 17:46   Dg Chest 2 View  Result Date: 02/20/2017 CLINICAL DATA:  Upper mid back pain abdominal pain. EXAM: CHEST  2 VIEW COMPARISON:  10/19/2016 FINDINGS: Normal mediastinum and cardiac silhouette. Normal pulmonary vasculature. No evidence of effusion, infiltrate, or pneumothorax. No acute bony abnormality. Small amount of intraperitoneal free air beneath the LEFT hemidiaphragm Coronary artery stent noted IMPRESSION: 1.  No acute cardiopulmonary process. 2. Small volume intraperitoneal free air beneath the LEFT hemidiaphragm 3. Coronary artery stent noted. Electronically Signed   By: Genevive BiStewart  Edmunds M.D.   On: 02/20/2017 18:02   STUDIES:  2/10 CT abd: 1. Intraperitoneal free air and free fluid consistent with bowel perforation. Favor perforation to either be along the second portion duodenum or in the mid small bowel of the LEFT abdomen. There  is thickened loops of small bowel in the LEFT abdomen. There is extraluminal gas along the second portion of the duodenum. 2. Exam is somewhat difficult to localize perforation source as no IV oral contrast. 3. Vascular calcifications are advanced for age. 4. Recommend surgical consultation.  CULTURES: none  ANTIBIOTICS: 2/10 zosyn >>  SIGNIFICANT EVENTS: 2/10 Admit/ OR  LINES/TUBES: PIV x 2 2/10 R IJ TL CVC >> 2/10 Left radial Aline >> 2/10 ETT >> 2/10 OGT >> 2/10 Gtube >> 2/10 JP drain- right abd >> 2/10 foley >>  DISCUSSION: [age] yoM admitted with worsening N/V/D, abd pain, and hematemesis with peritonitits and perforated viscus on CTabd taken to OR for exploratory lab found to have perforated gastric ulcer.  ASSESSMENT / PLAN:  PULMONARY A: Acute respiratory insufficiency in the post- operative setting Tobacco abuse P:   Full MV support, PRVC 8cc/kg ABG noted, rate 16 CXR now  VAP protocol  CARDIOVASCULAR A:  Hypertension Hx HTN, CAD w/stents, ICM- EF  40-45% per TTE 06/2016 - reportedly noncompliant with meds P:  Tele monitoring Continue Aline and CVC Assess CVP and lactate UDS now given hx of illicit drug abuse, before resuming BB PAD protocol as below Goal MAP > 65 Hold hold meds for now  RENAL A:   AKI with oliguria  Hypokalemia Hypomag P:   S/p 2L IVF, LR at 125 ml/hr KCL x 4 runs and Mag 2 gm now Trend BMP/ mag/phos / urinary output Replace electrolytes as indicated  GASTROINTESTINAL A:   Perforated Gastric ulcer s/p gastrostomy w/ JP/ midline incision Peritonitis  P:   Gtube/ OGT/ JP drain/ Midline incision Per surgery NPO Protonix q 12 hr See ID   HEMATOLOGIC A:   At risk for ABLA Leukopenia  P:  Trend CBC Monitor JP drainage SCDs for VTE ppx  INFECTIOUS A:   Peritonitis  P:   Continue zosyn Send BC Trend WBC/ fever curve Trend PCT  ENDOCRINE A:   No acute issues P:   CBG q 4 while NPO  NEUROLOGIC A:    Acute encephalopathy related to sedation Illicit drug abuse Hx bipolar disorder- ? Unsure compliant with meds/ on home trazondone P:   RASS goal: -1 PAD protocol with propofol gtt and fentanyl prn UDS now DWA ? etoh use, empiric thiamine IV for now  FAMILY  - Updates:  No family at bedside  - Inter-disciplinary family meet or Palliative Care meeting due by:  02/27/2017  CCT 45 mins  Posey Boyer, AGACNP-BC Pulmonary and Critical Care Medicine [facility] Pager: [phone]  02/20/2017, 9:34 PM

## 2017-02-20 NOTE — Op Note (Signed)
Preoperative diagnosis: perforated viscus, peritonitis Postoperative diagnosis: perforated gastric ulcer Procedure: exploratory laparotomy with omental patch of gastric ulcer Stamm gastrostomy Surgeon: Dr Harden Mo Anesthesia: general EBL: 150 cc Specimens none Drains 19 Fr Blake drain near patch Complications none Sponge and needle count correct dispo to recovery stable  Indications: this is a 29 yom who has multiple medical issues that I saw in er with acute abdominal pain. He had peritonitis on exam. He had a ct that showed free fluid and free air. I discussed with her and his sister going to or urgently for laparotomy.  Procedure: After informed consent was obtained the patient was taken to the operating room.  He was about 92 degrees during the operation.  He had a warmer in place.  He was given antibiotics.  SCDs were in place.  He was placed under general anesthesia without complication.  He had a right internal jugular central line placed by anesthesia.  He had a radial arterial line.  He also had a Foley catheter and nasogastric tube placed.  He was then prepped and draped in the standard sterile surgical fashion.  A surgical timeout was then performed.  I made an upper midline incision that I carried a little bit below the umbilicus.  I entered into the peritoneum without difficulty.  He had purulent appearing fluid throughout all 4 quadrants of his abdomen.  I evacuated this.  I ran the small bowel first and this was all normal.  His colon also appeared normal in his upper abdomen he had a lot of free fluid.  Initially I was not able to identify the source.  There was a lot of contamination and inflammation.  I then incised the gastrohepatic ligament.  And I was able then to note a lesser curvature gastric ulcer that had perforated.  This was about 8 mm in size.  At this point he was fairly ill and was on pressors.  I elected to do a patch repair of this ulcer and not any more at  this point.  I then was able to actually close the ulcer with 2-0 silk suture.  I then placed 2-0 silk sutures in the stomach on either side.  I brought a large tongue of omentum up and patched this and tied the sutures down.  This completely obliterated the hole.  I then elected to place a gastrostomy tube is I think he may need prolonged drainage.  I placed 2 rings of 2-0 silk sutures in the stomach near the greater curvature.  I then made a hole in the abdominal wall.  I then placed the gastric tube through that.  I inserted this into the stomach after making a gastrotomy.  I blew the balloon up.  I then tied down my pursestring sutures.  I brought this into apposition with the abdominal wall.  I then secured this to the abdominal wall with 2-0 silk sutures.  I secured the tube with nylon sutures.  I then reinspected the abdomen.  I irrigated copiously.  He was bleeding from almost everything that I touched at this point due to likely to his temperature.  There was nothing that was surgically bleeding.  I then placed a 14 [place] Blake drain near the ulcer repair.  I secured this with a 2-0 nylon suture.  I then closed him with #1 looped PDS.  I placed a gauze wet-to-dry dressing over the wound.  A Biopatch and a Tegaderm were placed over the JP site.  He tolerated this fairly well.  He will be transferred to the ICU intubated.  I have contacted the critical care team to evaluate him in the ICU.  I have also contacted his sister Donnella BiMarquila who can be reached at [phone]

## 2017-02-20 NOTE — Anesthesia Procedure Notes (Signed)
Central Venous Catheter Insertion Performed by: Dorris SinghGreen, Clydell Alberts, MD, anesthesiologist Start/End2/10/2017 7:20 PM, 02/20/2017 7:35 PM Preanesthetic checklist: patient identified, IV checked, site marked, risks and benefits discussed, surgical consent, monitors and equipment checked, pre-op evaluation and timeout performed Position: Trendelenburg Hand hygiene performed , maximum sterile barriers used  and Seldinger technique used Central line was placed.Double lumen Swan type:thermodilution Procedure performed using ultrasound guided technique. Ultrasound Notes:image(s) printed for medical record Attempts: 1 Following insertion, line sutured, dressing applied and Biopatch. Post procedure assessment: blood return through all ports  Patient tolerated the procedure well with no immediate complications.

## 2017-02-20 NOTE — ED Triage Notes (Signed)
Pt arrives via EMS with reports of abd pain L sided radiating to central abdomen. NVD over the last week. Evaluated yesterday and told to come back if pain does not resolve. Pt given 4mg  zofran en route. 180/100, hr 114, RR 22, 99% RA. Pt with hx of hypertension, out of medications for approx 5 months.

## 2017-02-20 NOTE — H&P (Signed)
[name] is an 42 y.o. male.   Chief Complaint: abd pain HPI: 36 yom with history substance abuse and cad (s/p stents multiple times) who presents with acute onset abdominal pain about four hours ago. He was in er yesterday for n/v/d and was discharged. This was followed by the current abd pain. This is getting worse, nothing making it better. He still has emesis and some was bloody.  Having flatus.  He has not been on nsaids/asa.  He smokes cigarettes and marijuana but denies other drugs at this time. He is supposed to be on brilinta and psych meds but is on nothing at this time.  He had ct that shows perforated viscus.   Past Medical History:  Diagnosis Date  . Anxiety   . Bipolar 1 disorder ([place])   . CAD S/P percutaneous coronary angioplasty    a. Inf STEMI s/p BMS to RCA 01/2007. b. NSTEMI 07/2013 s/p PTCA to RCA for ISR; c. Transient inferior ST elevation (peak Ti 0.25) 01/2014 s/p PTCA/DES to pRCA, PTCA/DES to dRCA, EF 50%; d. 12/2014 Inf STEMI: RCA patent prox stent, 129md (3.0x32 Synergy DES), EF 35-45; e. 05/2015 Inf STEMI/Cath: LM nl, LAD 10ost/m, D1 75, LCX nl, OM1 25, OM2 30, RCA patent stents, RPDA 30ost.  . Hx of medication noncompliance   . Hyperlipidemia   . Hypertensive heart disease   . Ischemic cardiomyopathy    a. EF 40% in 2011, 60% in 2012. b. EF 55% by cath 07/2013. c. EF 50% by cath 01/2014; d. 12/2014 EF 35-45% by LV gram; e. 05/2015 Echo: EF 50-55% inflat/inf AK, mild AI; f. 06/2015 cMRI: EF 49%, basal & mid inf full thickness scar, subendocardial scar in antsept and antlat wall, correlating w/ D1 dzs.  . Myocardial infarction ([facility]    pt report   . NSVT (nonsustained ventricular tachycardia) ([facility]    a. Very brief run during 07/2013 admit for NSTEMI felt due to MI.  . Polysubstance abuse ([facility]    a. h/o tobacco, marijuana and crack cocaine use. b. 07/2013: +UDS THC, neg for cocaine.  . Schizophrenia ([facility]    pt report   . Tobacco abuse     Past Surgical History:   Procedure Laterality Date  . CARDIAC CATHETERIZATION  01/16/2009   normal left main, Cfx with 2-OMs both w/minor irregularities, LAd with 20-30% mid region irregularities, ramus intermediate/optional diagonal with 60% osital narrowing, RCA with stent in distal portion w/20% prox in-stent stenosis (Dr. ALoni Muse Little)  . CARDIAC CATHETERIZATION  07/23/2013   two vessel obstructive CAD, occluded first diagonal, focal in-stent restenosis in distal RCA (Dr. Peter JMartinique  . CARDIAC CATHETERIZATION N/A 01/10/2015   Procedure: Left Heart Cath and Coronary Angiography;  Surgeon: DLeonie Man MD;  [facility];  Service: Cardiovascular;  Laterality: N/A;  . CARDIAC CATHETERIZATION N/A 01/10/2015   Procedure: Coronary Stent Intervention;  Surgeon: DLeonie Man MD;  [facility];  Service: Cardiovascular;  Laterality: N/A;  . CARDIAC CATHETERIZATION N/A 06/10/2015   Procedure: Left Heart Cath and Coronary Angiography;  Surgeon: Peter M JMartinique MD;  [facility];  Service: Cardiovascular;  Laterality: N/A;  . CARDIAC CATHETERIZATION N/A 09/27/2015   Procedure: Left Heart Cath and Coronary Angiography;  Surgeon: JJettie Booze MD;  [facility];  Service: Cardiovascular;  Laterality: N/A;  . CARDIAC CATHETERIZATION N/A 09/27/2015   Procedure: Coronary Balloon Angioplasty;  Surgeon: JJettie Booze MD;  Location: [place] [facility];  Service: Cardiovascular;  Laterality: N/A;  . CORONARY ANGIOPLASTY WITH STENT PLACEMENT  02/05/2007   3.5x3m Quantum non-DES to RCA (Dr. TShelva Majestic  . FEMORAL ARTERY STENT    . LEFT AND RIGHT HEART CATHETERIZATION WITH CORONARY ANGIOGRAM N/A 01/14/2014   Procedure: LEFT AND RIGHT HEART CATHETERIZATION WITH CORONARY ANGIOGRAM;  Surgeon: CBurnell Blanks MD;  [facility];  Service: Cardiovascular;  Laterality: N/A;  . LEFT HEART CATH AND CORONARY ANGIOGRAPHY N/A 07/12/2016   Procedure: Left  Heart Cath and Coronary Angiography;  Surgeon: JMartinique Peter M, MD;  [facility];  Service: Cardiovascular;  Laterality: N/A;  . LEFT HEART CATHETERIZATION WITH CORONARY ANGIOGRAM N/A 07/23/2013   Procedure: LEFT HEART CATHETERIZATION WITH CORONARY ANGIOGRAM;  Surgeon: Peter M JMartinique MD;  [facility];  Service: Cardiovascular;  Laterality: N/A;    Family History  Problem Relation Age of Onset  . CAD Mother   . Heart disease Mother   . Heart attack Mother   . Schizophrenia Mother   . CAD Sister    Social History:  reports that he has been smoking cigarettes.  He has a 30.00 pack-year smoking history. he has never used smokeless tobacco. He reports that he drinks alcohol. He reports that he uses drugs. Drug: Marijuana.  Allergies:  Allergies  Allergen Reactions  . Iodine Anaphylaxis and Swelling  . Shellfish Allergy Anaphylaxis    All shellfish  . Contrast Media [Iodinated Diagnostic Agents] Nausea And Vomiting   meds not taking any of his recommended medications  Results for orders placed or performed during the hospital encounter of 02/20/17 (from the past 48 hour(s))  I-Stat Troponin, ED ([facility]     Status: None   Collection Time: 02/20/17  4:36 PM  Result Value Ref Range   Troponin i, poc 0.00 0.00 - 0.08 ng/mL   Comment 3            Comment: Due to the release kinetics of cTnI, a negative result within the first hours of the onset of symptoms does not rule out myocardial infarction with certainty. If myocardial infarction is still suspected, repeat the test at appropriate intervals.   Comprehensive metabolic panel     Status: Abnormal   Collection Time: 02/20/17  4:39 PM  Result Value Ref Range   Sodium 138 135 - 145 mmol/L   Potassium 3.8 3.5 - 5.1 mmol/L   Chloride 94 (L) 101 - 111 mmol/L   CO2 25 22 - 32 mmol/L   Glucose, Bld 161 (H) 65 - 99 mg/dL   BUN 6 6 - 20 mg/dL   Creatinine, Ser 1.34 (H) 0.61 - 1.24 mg/dL   Calcium 10.2 8.9  - 10.3 mg/dL   Total Protein 8.0 6.5 - 8.1 g/dL   Albumin 4.7 3.5 - 5.0 g/dL   AST 38 15 - 41 U/L   ALT 22 17 - 63 U/L   Alkaline Phosphatase 69 38 - 126 U/L   Total Bilirubin 1.6 (H) 0.3 - 1.2 mg/dL   GFR calc non Af Amer >60 >60 mL/min   GFR calc Af Amer >60 >60 mL/min    Comment: (NOTE) The eGFR has been calculated using the CKD EPI equation. This calculation has not been validated in all clinical situations. eGFR's persistently <60 mL/min signify possible Chronic Kidney Disease.    Anion gap 19 (H) 5 - 15    Comment: Performed at [facility]  [address] [place], [place] [address]  CBC with Differential     Status: Abnormal   Collection Time: 02/20/17  4:39 PM  Result Value Ref Range   WBC 5.1 4.0 - 10.5 K/uL   RBC 5.70 4.22 - 5.81 MIL/uL   Hemoglobin 18.2 (H) 13.0 - 17.0 g/dL   HCT 51.0 39.0 - 52.0 %   MCV 89.5 78.0 - 100.0 fL   MCH 31.9 26.0 - 34.0 pg   MCHC 35.7 30.0 - 36.0 g/dL   RDW 16.1 (H) 11.5 - 15.5 %   Platelets 297 150 - 400 K/uL   Neutrophils Relative % 80 %   Neutro Abs 4.1 1.7 - 7.7 K/uL   Lymphocytes Relative 15 %   Lymphs Abs 0.8 0.7 - 4.0 K/uL   Monocytes Relative 5 %   Monocytes Absolute 0.2 0.1 - 1.0 K/uL   Eosinophils Relative 0 %   Eosinophils Absolute 0.0 0.0 - 0.7 K/uL   Basophils Relative 0 %   Basophils Absolute 0.0 0.0 - 0.1 K/uL    Comment: Performed at [facility] [address], [place], [place] [address]  Lipase, blood     Status: None   Collection Time: 02/20/17  4:39 PM  Result Value Ref Range   Lipase 23 11 - 51 U/L    Comment: Performed at [facility] [address] [place],  [address]   Ct Abdomen Pelvis Wo Contrast  Result Date: 02/20/2017 CLINICAL DATA:  Nausea vomiting, abdominal pain EXAM: CT ABDOMEN AND PELVIS WITHOUT CONTRAST TECHNIQUE: Multidetector CT imaging of the abdomen and pelvis was performed following the standard protocol without IV contrast. COMPARISON:  CT 06/09/2016 FINDINGS:  Lower chest: Lung bases are clear. Hepatobiliary: No focal hepatic lesion. There is extraluminal gas along the inferior margin of the LEFT hepatic lobe and anterior to LEFT hepatic lobe consistent intraperitoneal free air (image 12, series 5). No pneumobilia. No portal venous gas. Pancreas: Pancreas is normal. No ductal dilatation. No pancreatic inflammation. Spleen: Normal spleen Adrenals/urinary tract: Adrenal glands and kidneys are normal. The ureters and bladder normal. Stomach/Bowel: GI tract is somewhat difficult to evaluate without IV oral contrast. The stomach appears normal. There is extraluminal gas in the gastro hepatic ligament (image 16, series 3). There is extraluminal gas along the second portion the duodenum. This could represent a source of the perforation (image 29, series 6). There is a moderate volume of free fluid in the upper abdomen and lower pelvis which does not localize source. Fluid is low-density There is a thickened loop of small bowel in the LEFT mid abdomen with a 13 mm wall (image 27, series 6). This also could represent source of perforation Cecum is normal. The appendix is not clearly identified. Ascending and transverse colon appear normal. The descending colon and rectosigmoid colon are normal. Vascular/Lymphatic: Abdominal aorta is normal caliber with atherosclerotic calcification. There is no retroperitoneal or periportal lymphadenopathy. No pelvic lymphadenopathy. Reproductive: Prostate normal for age calcifications Seminal vesicles Other: Moderate free fluid Musculoskeletal: No aggressive osseous lesion. IMPRESSION: 1. Intraperitoneal free air and free fluid consistent with bowel perforation. Favor perforation to either be along the second portion duodenum or in the mid small bowel of the LEFT abdomen. There is thickened loops of small bowel in the LEFT abdomen. There is extraluminal gas along the second portion of the duodenum. 2. Exam is somewhat difficult to localize  perforation source as no IV oral contrast. 3. Vascular calcifications are advanced for  age. 57. Recommend surgical consultation. Critical Value/emergent results were called by telephone at the time of interpretation on 02/20/2017 at 5:42 pm to Dr. Thomasene Lot, [place] Course, [place], who verbally acknowledged these results. Electronically Signed   By: Suzy Bouchard M.D.   On: 02/20/2017 17:46   Dg Chest 2 View  Result Date: 02/20/2017 CLINICAL DATA:  Upper mid back pain abdominal pain. EXAM: CHEST  2 VIEW COMPARISON:  10/19/2016 FINDINGS: Normal mediastinum and cardiac silhouette. Normal pulmonary vasculature. No evidence of effusion, infiltrate, or pneumothorax. No acute bony abnormality. Small amount of intraperitoneal free air beneath the LEFT hemidiaphragm Coronary artery stent noted IMPRESSION: 1.  No acute cardiopulmonary process. 2. Small volume intraperitoneal free air beneath the LEFT hemidiaphragm 3. Coronary artery stent noted. Electronically Signed   By: Suzy Bouchard M.D.   On: 02/20/2017 18:02    Review of Systems  Gastrointestinal: Positive for abdominal pain, nausea and vomiting.  All other systems reviewed and are negative.   Blood pressure (!) 149/102, pulse 88, temperature (!) 97.5 F (36.4 C), temperature source Axillary, resp. rate (!) 24, SpO2 100 %. Physical Exam  Constitutional: He is oriented to person, place, and time. He appears distressed.  HENT:  Head: Normocephalic and atraumatic.  Right Ear: External ear normal.  Left Ear: External ear normal.  Eyes: Pupils are equal, round, and reactive to light. No scleral icterus.  Neck: Neck supple.  Cardiovascular: Normal rate, regular rhythm and normal heart sounds.  Respiratory: Effort normal and breath sounds normal. He has no wheezes. He has no rales.  GI: He exhibits no distension. Bowel sounds are decreased. There is tenderness. There is rigidity, rebound and guarding. No hernia.  Musculoskeletal: Normal range of motion.   Neurological: He is alert and oriented to person, place, and time.  Skin: He is diaphoretic. There is pallor.  Psychiatric: He has a normal mood and affect.     Assessment/Plan Likely perforated ulcer vs small bowel  He has peritonitis on exam with ct consistent with perforation I think likely perforated duodenal ulcer given picture although could have another source. Regardless needs to go to or urgently.  I discussed elap with either repair of perforation, patch, vs resection depending on where this is. We discussed risks including bleeding, infection, reoperation as well as cardiac risks given his history. I think he is at least moderate risk  And has a 2.45% chance of postop mi or arrest with gupta score.  Will ask hospitalists to see postop as well.  Will proceed to or asap  Rolm Bookbinder, MD 02/20/2017, 6:10 PM

## 2017-02-20 NOTE — Anesthesia Procedure Notes (Signed)
Arterial Line Insertion Start/End2/10/2017 6:55 PM, 02/20/2017 7:01 PM Performed by: Dorris SinghGreen, Charlene, MD, Edmonia CaprioAuston, Amanda M, CRNA, CRNA  Preanesthetic checklist: patient identified, IV checked, risks and benefits discussed, surgical consent, monitors and equipment checked and pre-op evaluation Lidocaine 1% used for infiltration and patient sedated Left, radial was placed Catheter size: 20 G Hand hygiene performed  and maximum sterile barriers used   Attempts: 1 Procedure performed without using ultrasound guided technique. Ultrasound Notes:anatomy identified Following insertion, dressing applied and Biopatch.

## 2017-02-20 NOTE — Transfer of Care (Signed)
Immediate Anesthesia Transfer of Care Note  Patient: [name]  Procedure(s) Performed: EXPLORATORY LAPAROTOMY AND REPAIR, PATCH, OR REMOVE PERFORATION IN BOWEL (N/A Abdomen)  Patient Location: ICU  Anesthesia Type:General  Level of Consciousness: Patient remains intubated per anesthesia plan  Airway & Oxygen Therapy: Patient remains intubated per anesthesia plan and Patient placed on Ventilator (see vital sign flow sheet for setting)  Post-op Assessment: Report given to RN and Post -op Vital signs reviewed and stable  Post vital signs: Reviewed and stable  Last Vitals:  Vitals:   02/20/17 1800 02/20/17 1815  BP: (!) 149/102 (!) 170/126  Pulse: 88 80  Resp: (!) 24   Temp:    SpO2: 100% 100%    Last Pain:  Vitals:   02/20/17 1544  TempSrc:   PainSc: 9          Complications: No apparent anesthesia complications

## 2017-02-20 NOTE — ED Provider Notes (Signed)
[facility] NEURO/TRAUMA/SURGICAL ICU Provider Note   CSN: [id number] Arrival date & time: 02/20/17  1531     History   Chief Complaint Chief Complaint  Patient presents with  . Abdominal Pain    HPI [name] is a 42 y.o. male past medical history significant for CAD with prior STEMIs, hyperlipidemia, hypertension, anxiety, bipolar disorder, schizophrenia, substance abuse presenting via EMS with sudden onset epigastric pain which started today.  Reports being seen yesterday for nausea vomiting diarrhea for a few days but had no abdominal pain. He now reports severe epigastric pain radiating to his LUQ which he denies ever experiencing in the past. He does admit to marijuana use. HPI  Past Medical History:  Diagnosis Date  . Anxiety   . Bipolar 1 disorder (HCC)   . CAD S/P percutaneous coronary angioplasty    a. Inf STEMI s/p BMS to RCA 01/2007. b. NSTEMI 07/2013 s/p PTCA to RCA for ISR; c. Transient inferior ST elevation (peak Ti 0.25) 01/2014 s/p PTCA/DES to pRCA, PTCA/DES to dRCA, EF 50%; d. 12/2014 Inf STEMI: RCA patent prox stent, 16m/d (3.0x32 Synergy DES), EF 35-45; e. 05/2015 Inf STEMI/Cath: LM nl, LAD 10ost/m, D1 75, LCX nl, OM1 25, OM2 30, RCA patent stents, RPDA 30ost.  . Hx of medication noncompliance   . Hyperlipidemia   . Hypertensive heart disease   . Ischemic cardiomyopathy    a. EF 40% in 2011, 60% in 2012. b. EF 55% by cath 07/2013. c. EF 50% by cath 01/2014; d. 12/2014 EF 35-45% by LV gram; e. 05/2015 Echo: EF 50-55% inflat/inf AK, mild AI; f. 06/2015 cMRI: EF 49%, basal & mid inf full thickness scar, subendocardial scar in antsept and antlat wall, correlating w/ D1 dzs.  . Myocardial infarction [facility])    pt report   . NSVT (nonsustained ventricular tachycardia) (HCC)    a. Very brief run during 07/2013 admit for NSTEMI felt due to MI.  . Polysubstance abuse (HCC)    a. h/o tobacco, marijuana and crack cocaine use. b. 07/2013: +UDS THC, neg for cocaine.  .  Schizophrenia (HCC)    pt report   . Tobacco abuse     Patient Active Problem List   Diagnosis Date Noted  . Peritonitis (HCC) 02/20/2017  . Perforated gastric ulcer (HCC) 02/20/2017  . Bipolar I disorder, most recent episode mixed (HCC) 10/22/2016  . Unstable angina (HCC)   . Chronic systolic CHF (congestive heart failure) (HCC) 07/08/2016  . Left leg numbness   . Tetrahydrocannabinol (THC) use disorder, severe, dependence (HCC) 03/14/2016  . Malnutrition of moderate degree 09/28/2015  . Hyperlipidemia   . Schizoaffective disorder, bipolar type (HCC) 03/06/2015  . Nausea with vomiting 03/06/2015  . Hypertensive heart disease 01/11/2015  . ST elevation myocardial infarction (STEMI) of inferior wall (HCC) 01/10/2015  . ST elevation myocardial infarction (STEMI) of inferior wall, initial episode of care (HCC) 01/10/2015  . Medical non-compliance   . ST elevation myocardial infarction involving right coronary artery (HCC) 01/15/2014  . Hx of medication noncompliance   . NSVT (nonsustained ventricular tachycardia) (HCC)   . Ischemic cardiomyopathy   . Tobacco abuse   . Anxiety   . 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 DES PCI to RCA x 2 (3.0 mm x 22 mm Resolute - distal & proximal RCA) 01/14/2014    Class: Present on Admission  . Non-STEMI (non-ST elevated myocardial infarction) (HCC) 07/22/2013  . Atypical chest pain 07/21/2013  . Bipolar 1 disorder, depressed (HCC)  07/21/2013  . HLD (hyperlipidemia) 04/16/2013  . Hyperlipidemia with target LDL less than 70 01/27/2009  . SUBSTANCE ABUSE 01/27/2009  . Depression 01/27/2009  . Essential hypertension 01/27/2009  . CAD S/P BMS PCI to RCA with PTCA for ISR --> followed by stent thrombosis - DES PCI 01/27/2009  . GASTRITIS 01/27/2009    Past Surgical History:  Procedure Laterality Date  . CARDIAC CATHETERIZATION  01/16/2009   normal left main, Cfx with 2-OMs both w/minor irregularities, LAd with 20-30% mid region irregularities,  ramus intermediate/optional diagonal with 60% osital narrowing, RCA with stent in distal portion w/20% prox in-stent stenosis (Dr. Mervyn Skeeters. [place])  . CARDIAC CATHETERIZATION  07/23/2013   two vessel obstructive CAD, occluded first diagonal, focal in-stent restenosis in distal RCA (Dr. Peter Swaziland)  . CARDIAC CATHETERIZATION N/A 01/10/2015   Procedure: Left Heart Cath and Coronary Angiography;  Surgeon: Marykay Lex, MD;  Location: [facility] INVASIVE CV LAB;  Service: Cardiovascular;  Laterality: N/A;  . CARDIAC CATHETERIZATION N/A 01/10/2015   Procedure: Coronary Stent Intervention;  Surgeon: Marykay Lex, MD;  Location: [facility] INVASIVE CV LAB;  Service: Cardiovascular;  Laterality: N/A;  . CARDIAC CATHETERIZATION N/A 06/10/2015   Procedure: Left Heart Cath and Coronary Angiography;  Surgeon: Peter M Swaziland, MD;  Location: [facility] INVASIVE CV LAB;  Service: Cardiovascular;  Laterality: N/A;  . CARDIAC CATHETERIZATION N/A 09/27/2015   Procedure: Left Heart Cath and Coronary Angiography;  Surgeon: Corky Crafts, MD;  Location: [facility] INVASIVE CV LAB;  Service: Cardiovascular;  Laterality: N/A;  . CARDIAC CATHETERIZATION N/A 09/27/2015   Procedure: Coronary Balloon Angioplasty;  Surgeon: Corky Crafts, MD;  Location: [facility] INVASIVE CV LAB;  Service: Cardiovascular;  Laterality: N/A;  . CORONARY ANGIOPLASTY WITH STENT PLACEMENT  02/05/2007   3.5x34mm Quantum non-DES to RCA (Dr. Nicki Guadalajara)  . FEMORAL ARTERY STENT    . LEFT AND RIGHT HEART CATHETERIZATION WITH CORONARY ANGIOGRAM N/A 01/14/2014   Procedure: LEFT AND RIGHT HEART CATHETERIZATION WITH CORONARY ANGIOGRAM;  Surgeon: Kathleene Hazel, MD;  Location: [facility] CATH LAB;  Service: Cardiovascular;  Laterality: N/A;  . LEFT HEART CATH AND CORONARY ANGIOGRAPHY N/A 07/12/2016   Procedure: Left Heart Cath and Coronary Angiography;  Surgeon: Swaziland, Peter M, MD;  Location: [facility] INVASIVE CV LAB;  Service: Cardiovascular;  Laterality: N/A;  . LEFT HEART CATHETERIZATION  WITH CORONARY ANGIOGRAM N/A 07/23/2013   Procedure: LEFT HEART CATHETERIZATION WITH CORONARY ANGIOGRAM;  Surgeon: Peter M Swaziland, MD;  Location: [facility] CATH LAB;  Service: Cardiovascular;  Laterality: N/A;       Home Medications    Prior to Admission medications   Medication Sig Start Date End Date Taking? Authorizing Provider  ARIPiprazole (ABILIFY) 15 MG tablet Take 1 tablet (15 mg total) by mouth daily. For mood control 10/26/16   Armandina Stammer I, NP  aspirin EC 81 MG tablet Take 81 mg daily by mouth.    [provider]  clopidogrel (PLAVIX) 75 MG tablet Take 1 tablet (75 mg total) daily by mouth. 11/25/16   Lennette Bihari, MD  lisinopril (PRINIVIL,ZESTRIL) 5 MG tablet Take 1 tablet (5 mg total) by mouth daily. For high blood pressure 10/26/16   Armandina Stammer I, NP  metoprolol succinate (TOPROL XL) 25 MG 24 hr tablet Take 0.5 tablets (12.5 mg total) daily by mouth. 11/25/16   Lennette Bihari, MD  nicotine polacrilex (NICORETTE) 2 MG gum Take 1 each (2 mg total) by mouth as needed for smoking cessation. (May buy from over the  counter): Smoking cessation 10/25/16   Armandina Stammer I, NP  nitroGLYCERIN (NITROSTAT) 0.4 MG SL tablet Place 1 tablet (0.4 mg total) under the tongue every 5 (five) minutes x 3 doses as needed for chest pain. 10/25/16   Armandina Stammer I, NP  ondansetron (ZOFRAN ODT) 4 MG disintegrating tablet Take 1 tablet (4 mg total) by mouth every 8 (eight) hours as needed. 02/19/17   Mabe, Latanya Maudlin, MD  promethazine (PHENERGAN) 25 MG tablet Take 1 tablet (25 mg total) by mouth every 6 (six) hours as needed for nausea or vomiting. 02/19/17   Phillis Haggis, MD  rosuvastatin (CRESTOR) 40 MG tablet Take 1 tablet (40 mg total) daily by mouth. 11/25/16 02/23/17  Lennette Bihari, MD  traZODone (DESYREL) 100 MG tablet Take 1 tablet (100 mg total) by mouth at bedtime as needed for sleep. 10/25/16   Sanjuana Kava, NP    Family History Family History  Problem Relation Age of Onset  . CAD  Mother   . Heart disease Mother   . Heart attack Mother   . Schizophrenia Mother   . CAD Sister     Social History Social History   Tobacco Use  . Smoking status: Current Every Day Smoker    Packs/day: 1.00    Years: 30.00    Pack years: 30.00    Types: Cigarettes  . Smokeless tobacco: Never Used  Substance Use Topics  . Alcohol use: Yes    Alcohol/week: 0.0 oz    Comment: 1 40 oz 2 times a week "cause my gf does"  . Drug use: Yes    Types: Marijuana    Comment: current user- daily one blunt      Allergies   Iodine; Shellfish allergy; and Contrast media [iodinated diagnostic agents]   Review of Systems Review of Systems  Constitutional: Positive for diaphoresis. Negative for chills and fever.  Eyes: Negative for pain and visual disturbance.  Respiratory: Negative for cough, choking, chest tightness, shortness of breath, wheezing and stridor.   Cardiovascular: Negative for chest pain, palpitations and leg swelling.  Gastrointestinal: Positive for abdominal pain, diarrhea, nausea and vomiting. Negative for abdominal distention and blood in stool.  Genitourinary: Negative for difficulty urinating, dysuria, flank pain, frequency and hematuria.  Musculoskeletal: Negative for arthralgias, back pain, gait problem, joint swelling, myalgias, neck pain and neck stiffness.  Skin: Negative for color change, pallor and rash.  Neurological: Negative for dizziness, seizures, syncope, weakness, light-headedness and headaches.     Physical Exam Updated Vital Signs BP (!) 170/126   Pulse 80   Temp (!) 97.5 F (36.4 C) (Axillary)   Resp (!) 24   SpO2 100%   Physical Exam  Constitutional: He appears well-developed and well-nourished.  Non-toxic appearance. He does not appear ill. No distress.  Afebrile, diaphoretic, lying in bed in apparent discomfort.  No acute distress.  HENT:  Head: Normocephalic and atraumatic.  Eyes: Conjunctivae and EOM are normal. No scleral icterus.    Neck: Neck supple.  Cardiovascular: Normal rate, regular rhythm and normal heart sounds.  No murmur heard. Pulmonary/Chest: Effort normal and breath sounds normal. No stridor. No respiratory distress. He has no wheezes. He has no rhonchi. He has no rales. He exhibits no tenderness.  Abdominal: Soft. Normal appearance and bowel sounds are normal. He exhibits no distension, no pulsatile midline mass and no mass. There is tenderness. There is no rigidity, no rebound, no guarding, no CVA tenderness, no tenderness at McBurney's point and negative  Murphy's sign.  Generalized discomfort on palpation. TTP epigastric/LUQ  Musculoskeletal: He exhibits no edema.  Neurological: He is alert.  Skin: Skin is warm. No rash noted. He is diaphoretic. No cyanosis or erythema. No pallor.  Psychiatric: He has a normal mood and affect.  Nursing note and vitals reviewed.    ED Treatments / Results  Labs (all labs ordered are listed, but only abnormal results are displayed) Labs Reviewed  COMPREHENSIVE METABOLIC PANEL - Abnormal; Notable for the following components:      Result Value   Chloride 94 (*)    Glucose, Bld 161 (*)    Creatinine, Ser 1.34 (*)    Total Bilirubin 1.6 (*)    Anion gap 19 (*)    All other components within normal limits  CBC WITH DIFFERENTIAL/PLATELET - Abnormal; Notable for the following components:   Hemoglobin 18.2 (*)    RDW 16.1 (*)    All other components within normal limits  POCT I-STAT 7, (LYTES, BLD GAS, ICA,H+H) - Abnormal; Notable for the following components:   pH, Arterial 7.342 (*)    pCO2 arterial 52.3 (*)    pO2, Arterial 618.0 (*)    Bicarbonate 28.3 (*)    Potassium 2.3 (*)    Calcium, Ion 1.50 (*)    All other components within normal limits  LIPASE, BLOOD  PROTIME-INR  I-STAT TROPONIN, ED    EKG  EKG Interpretation None       Radiology Ct Abdomen Pelvis Wo Contrast  Result Date: 02/20/2017 CLINICAL DATA:  Nausea vomiting, abdominal pain  EXAM: CT ABDOMEN AND PELVIS WITHOUT CONTRAST TECHNIQUE: Multidetector CT imaging of the abdomen and pelvis was performed following the standard protocol without IV contrast. COMPARISON:  CT 06/09/2016 FINDINGS: Lower chest: Lung bases are clear. Hepatobiliary: No focal hepatic lesion. There is extraluminal gas along the inferior margin of the LEFT hepatic lobe and anterior to LEFT hepatic lobe consistent intraperitoneal free air (image 12, series 5). No pneumobilia. No portal venous gas. Pancreas: Pancreas is normal. No ductal dilatation. No pancreatic inflammation. Spleen: Normal spleen Adrenals/urinary tract: Adrenal glands and kidneys are normal. The ureters and bladder normal. Stomach/Bowel: GI tract is somewhat difficult to evaluate without IV oral contrast. The stomach appears normal. There is extraluminal gas in the gastro hepatic ligament (image 16, series 3). There is extraluminal gas along the second portion the duodenum. This could represent a source of the perforation (image 29, series 6). There is a moderate volume of free fluid in the upper abdomen and lower pelvis which does not localize source. Fluid is low-density There is a thickened loop of small bowel in the LEFT mid abdomen with a 13 mm wall (image 27, series 6). This also could represent source of perforation Cecum is normal. The appendix is not clearly identified. Ascending and transverse colon appear normal. The descending colon and rectosigmoid colon are normal. Vascular/Lymphatic: Abdominal aorta is normal caliber with atherosclerotic calcification. There is no retroperitoneal or periportal lymphadenopathy. No pelvic lymphadenopathy. Reproductive: Prostate normal for age calcifications Seminal vesicles Other: Moderate free fluid Musculoskeletal: No aggressive osseous lesion. IMPRESSION: 1. Intraperitoneal free air and free fluid consistent with bowel perforation. Favor perforation to either be along the second portion duodenum or in the mid  small bowel of the LEFT abdomen. There is thickened loops of small bowel in the LEFT abdomen. There is extraluminal gas along the second portion of the duodenum. 2. Exam is somewhat difficult to localize perforation source as no IV oral contrast.  3. Vascular calcifications are advanced for age. 4. Recommend surgical consultation. Critical Value/emergent results were called by telephone at the time of interpretation on 02/20/2017 at 5:42 pm to Dr. Corlis LeakMACKUEN, Mitzi HansenOURTENEY, LYN, who verbally acknowledged these results. Electronically Signed   By: Genevive BiStewart  Edmunds M.D.   On: 02/20/2017 17:46   Dg Chest 2 View  Result Date: 02/20/2017 CLINICAL DATA:  Upper mid back pain abdominal pain. EXAM: CHEST  2 VIEW COMPARISON:  10/19/2016 FINDINGS: Normal mediastinum and cardiac silhouette. Normal pulmonary vasculature. No evidence of effusion, infiltrate, or pneumothorax. No acute bony abnormality. Small amount of intraperitoneal free air beneath the LEFT hemidiaphragm Coronary artery stent noted IMPRESSION: 1.  No acute cardiopulmonary process. 2. Small volume intraperitoneal free air beneath the LEFT hemidiaphragm 3. Coronary artery stent noted. Electronically Signed   By: Genevive BiStewart  Edmunds M.D.   On: 02/20/2017 18:02    Procedures Procedures (including critical care time)  Medications Ordered in ED Medications  piperacillin-tazobactam (ZOSYN) IVPB 3.375 g ( Intravenous MAR Unhold 02/20/17 2057)  potassium chloride 10 mEq in 50 mL *CENTRAL LINE* IVPB (10 mEq Intravenous Given 02/20/17 2012)  sodium chloride 0.9 % bolus 1,000 mL (1,000 mLs Intravenous New Bag/Given 02/20/17 1645)  fentaNYL (SUBLIMAZE) injection 50 mcg (50 mcg Intravenous Given 02/20/17 1644)  ondansetron (ZOFRAN) injection 4 mg (4 mg Intravenous Given 02/20/17 1643)  piperacillin-tazobactam (ZOSYN) IVPB 3.375 g ( Intravenous MAR Unhold 02/20/17 2057)     Initial Impression / Assessment and Plan / ED Course  I have reviewed the triage vital signs and the  nursing notes.  Pertinent labs & imaging results that were available during my care of the patient were reviewed by me and considered in my medical decision making (see chart for details).    Patient returning to ER from last night with new onset epigastric/LUQ pain. He was evaluated yesterday for N/V/D for  2 days, found to have mild AKI, given IV fluids, otherwise negative workup.  Discharged home with symptomatic relief and return precautions. Patient endorses marijuana use  Given patient's significant cardiac hx ordered workup. CT abdomen : Showing free air/ perforation  General surgery was consulted and patient was taken emergently to the operating room for suspected duodenal ulcer perforation.   Final Clinical Impressions(s) / ED Diagnoses   Final diagnoses:  Generalized abdominal pain  Perforation bowel [facility](HCC)    ED Discharge Orders    None       Gregary CromerMitchell, Rion Catala B, PA-C 02/20/17 2058    Abelino DerrickMackuen, Courteney Lyn, MD 02/21/17 0000

## 2017-02-20 NOTE — ED Notes (Signed)
Surgery at bedside.

## 2017-02-20 NOTE — Anesthesia Procedure Notes (Signed)
Procedure Name: Intubation Date/Time: 02/20/2017 7:20 PM Performed by: Babs Bertin, CRNA Pre-anesthesia Checklist: Patient identified, Emergency Drugs available, Suction available and Patient being monitored Patient Re-evaluated:Patient Re-evaluated prior to induction Oxygen Delivery Method: Circle System Utilized Preoxygenation: Pre-oxygenation with 100% oxygen Induction Type: IV induction, Rapid sequence and Cricoid Pressure applied Laryngoscope Size: Mac and 3 Grade View: Grade I Tube type: Oral Tube size: 7.5 mm Number of attempts: 1 Airway Equipment and Method: Stylet and Oral airway Placement Confirmation: ETT inserted through vocal cords under direct vision,  positive ETCO2 and breath sounds checked- equal and bilateral Secured at: 22 cm Tube secured with: Tape Dental Injury: Teeth and Oropharynx as per pre-operative assessment

## 2017-02-20 NOTE — Anesthesia Preprocedure Evaluation (Signed)
Anesthesia Evaluation  Patient identified by MRN, date of birth, ID band Patient awake    Reviewed: Allergy & Precautions, NPO status , Patient's Chart, lab work & pertinent test results  Airway Mallampati: II       Dental   Pulmonary Current Smoker,    breath sounds clear to auscultation       Cardiovascular hypertension, + angina + CAD, + Past MI and +CHF   Rhythm:Regular Rate:Normal     Neuro/Psych    GI/Hepatic Neg liver ROS, History noted. CG   Endo/Other  negative endocrine ROS  Renal/GU negative Renal ROS     Musculoskeletal   Abdominal   Peds  Hematology   Anesthesia Other Findings   Reproductive/Obstetrics                             Anesthesia Physical Anesthesia Plan  ASA: IV  Anesthesia Plan: General   Post-op Pain Management:    Induction: Intravenous  PONV Risk Score and Plan: 1 and Treatment may vary due to age or medical condition, Ondansetron, Dexamethasone and Midazolam  Airway Management Planned:   Additional Equipment:   Intra-op Plan:   Post-operative Plan: Possible Post-op intubation/ventilation  Informed Consent: I have reviewed the patients History and Physical, chart, labs and discussed the procedure including the risks, benefits and alternatives for the proposed anesthesia with the patient or authorized representative who has indicated his/her understanding and acceptance.   Dental advisory given  Plan Discussed with: CRNA, Anesthesiologist and Surgeon  Anesthesia Plan Comments:         Anesthesia Quick Evaluation

## 2017-02-20 NOTE — ED Notes (Signed)
Patient transported to CT 

## 2017-02-21 ENCOUNTER — Encounter (HOSPITAL_COMMUNITY): Payer: Self-pay | Admitting: General Surgery

## 2017-02-21 LAB — GLUCOSE, CAPILLARY
GLUCOSE-CAPILLARY: 105 mg/dL — AB (ref 65–99)
GLUCOSE-CAPILLARY: 82 mg/dL (ref 65–99)
GLUCOSE-CAPILLARY: 85 mg/dL (ref 65–99)
Glucose-Capillary: 95 mg/dL (ref 65–99)

## 2017-02-21 LAB — CBC
HCT: 41.1 % (ref 39.0–52.0)
HEMOGLOBIN: 14 g/dL (ref 13.0–17.0)
MCH: 30.5 pg (ref 26.0–34.0)
MCHC: 34.1 g/dL (ref 30.0–36.0)
MCV: 89.5 fL (ref 78.0–100.0)
PLATELETS: 204 10*3/uL (ref 150–400)
RBC: 4.59 MIL/uL (ref 4.22–5.81)
RDW: 15.8 % — ABNORMAL HIGH (ref 11.5–15.5)
WBC: 3.8 10*3/uL — ABNORMAL LOW (ref 4.0–10.5)

## 2017-02-21 LAB — BASIC METABOLIC PANEL
ANION GAP: 11 (ref 5–15)
BUN: 7 mg/dL (ref 6–20)
CALCIUM: 8.2 mg/dL — AB (ref 8.9–10.3)
CO2: 23 mmol/L (ref 22–32)
CREATININE: 1.11 mg/dL (ref 0.61–1.24)
Chloride: 105 mmol/L (ref 101–111)
Glucose, Bld: 96 mg/dL (ref 65–99)
Potassium: 3.9 mmol/L (ref 3.5–5.1)
SODIUM: 139 mmol/L (ref 135–145)

## 2017-02-21 LAB — MRSA PCR SCREENING: MRSA by PCR: NEGATIVE

## 2017-02-21 LAB — PROCALCITONIN: Procalcitonin: 13.53 ng/mL

## 2017-02-21 LAB — LACTIC ACID, PLASMA
LACTIC ACID, VENOUS: 1.2 mmol/L (ref 0.5–1.9)
LACTIC ACID, VENOUS: 3.7 mmol/L — AB (ref 0.5–1.9)

## 2017-02-21 MED ORDER — SODIUM CHLORIDE 0.9 % IV BOLUS (SEPSIS)
1000.0000 mL | Freq: Once | INTRAVENOUS | Status: AC
  Administered 2017-02-21: 1000 mL via INTRAVENOUS

## 2017-02-21 MED ORDER — DEXTROSE-NACL 5-0.9 % IV SOLN
INTRAVENOUS | Status: DC
  Administered 2017-02-21 – 2017-02-23 (×4): via INTRAVENOUS

## 2017-02-21 NOTE — Progress Notes (Signed)
eLink Physician-Brief Progress Note Patient Name: [name] J Raymond DOB: 1975-01-17 MRN: [id number]   Date of Service  02/21/2017  HPI/Events of Note  Lactic Acid = 3.7. Last LVEF = 40% to 45%.  eICU Interventions  Will bolus with 0.9 NaCl 1 liter IV over 1 hour now.      Intervention Category Major Interventions: Acid-Base disturbance - evaluation and management  Sommer,Steven Eugene 02/21/2017, 1:16 AM

## 2017-02-21 NOTE — Progress Notes (Signed)
1 Day Post-Op   Subjective/Chief Complaint: On vent, sedated    Objective: Vital signs in last 24 hours: Temp:  [97.5 F (36.4 C)-99 F (37.2 C)] 97.6 F (36.4 C) (02/11 0500) Pulse Rate:  [42-119] 81 (02/11 0737) Resp:  [15-30] 16 (02/11 0737) BP: (97-170)/(74-126) 105/82 (02/11 0737) SpO2:  [95 %-100 %] 100 % (02/11 0737) Arterial Line BP: (101-175)/(71-103) 126/75 (02/11 0700) FiO2 (%):  [40 %] 40 % (02/11 0737) Weight:  [57.2 kg (126 lb 1.6 oz)] 57.2 kg (126 lb 1.6 oz) (02/11 0108) Last BM Date: (pta)  Intake/Output from previous day: 02/10 0701 - 02/11 0700 In: 5458.2 [I.V.:3891.5; IV Piggyback:1566.7] Out: 1220 [Urine:400; Drains:670; Blood:150] Intake/Output this shift: Total I/O In: -  Out: 70 [Drains:70]  Exam: Abdomen soft, drain serosang, G-tube with bile   Lab Results:  Recent Labs    02/20/17 2135 02/21/17 0316  WBC 2.5* 3.8*  HGB 13.7 14.0  HCT 39.7 41.1  PLT 218 204   BMET Recent Labs    02/20/17 2135 02/21/17 0316  NA 140 139  K 2.8* 3.9  CL 104 105  CO2 23 23  GLUCOSE 164* 96  BUN 6 7  CREATININE 1.08 1.11  CALCIUM 9.1 8.2*   PT/INR Recent Labs    02/20/17 2135  LABPROT 20.2*  INR 1.74   ABG Recent Labs    02/20/17 1948 02/20/17 2208  PHART 7.342* 7.378  HCO3 28.3* 28.8*    Studies/Results: Ct Abdomen Pelvis Wo Contrast  Result Date: 02/20/2017 CLINICAL DATA:  Nausea vomiting, abdominal pain EXAM: CT ABDOMEN AND PELVIS WITHOUT CONTRAST TECHNIQUE: Multidetector CT imaging of the abdomen and pelvis was performed following the standard protocol without IV contrast. COMPARISON:  CT 06/09/2016 FINDINGS: Lower chest: Lung bases are clear. Hepatobiliary: No focal hepatic lesion. There is extraluminal gas along the inferior margin of the LEFT hepatic lobe and anterior to LEFT hepatic lobe consistent intraperitoneal free air (image 12, series 5). No pneumobilia. No portal venous gas. Pancreas: Pancreas is normal. No ductal  dilatation. No pancreatic inflammation. Spleen: Normal spleen Adrenals/urinary tract: Adrenal glands and kidneys are normal. The ureters and bladder normal. Stomach/Bowel: GI tract is somewhat difficult to evaluate without IV oral contrast. The stomach appears normal. There is extraluminal gas in the gastro hepatic ligament (image 16, series 3). There is extraluminal gas along the second portion the duodenum. This could represent a source of the perforation (image 29, series 6). There is a moderate volume of free fluid in the upper abdomen and lower pelvis which does not localize source. Fluid is low-density There is a thickened loop of small bowel in the LEFT mid abdomen with a 13 mm wall (image 27, series 6). This also could represent source of perforation Cecum is normal. The appendix is not clearly identified. Ascending and transverse colon appear normal. The descending colon and rectosigmoid colon are normal. Vascular/Lymphatic: Abdominal aorta is normal caliber with atherosclerotic calcification. There is no retroperitoneal or periportal lymphadenopathy. No pelvic lymphadenopathy. Reproductive: Prostate normal for age calcifications Seminal vesicles Other: Moderate free fluid Musculoskeletal: No aggressive osseous lesion. IMPRESSION: 1. Intraperitoneal free air and free fluid consistent with bowel perforation. Favor perforation to either be along the second portion duodenum or in the mid small bowel of the LEFT abdomen. There is thickened loops of small bowel in the LEFT abdomen. There is extraluminal gas along the second portion of the duodenum. 2. Exam is somewhat difficult to localize perforation source as no IV oral contrast. 3.  Vascular calcifications are advanced for age. 4. Recommend surgical consultation. Critical Value/emergent results were called by telephone at the time of interpretation on 02/20/2017 at 5:42 pm to Dr. Corlis LeakMACKUEN, Mitzi HansenOURTENEY, LYN, who verbally acknowledged these results. Electronically  Signed   By: Genevive BiStewart  Edmunds M.D.   On: 02/20/2017 17:46   Dg Chest 2 View  Result Date: 02/20/2017 CLINICAL DATA:  Upper mid back pain abdominal pain. EXAM: CHEST  2 VIEW COMPARISON:  10/19/2016 FINDINGS: Normal mediastinum and cardiac silhouette. Normal pulmonary vasculature. No evidence of effusion, infiltrate, or pneumothorax. No acute bony abnormality. Small amount of intraperitoneal free air beneath the LEFT hemidiaphragm Coronary artery stent noted IMPRESSION: 1.  No acute cardiopulmonary process. 2. Small volume intraperitoneal free air beneath the LEFT hemidiaphragm 3. Coronary artery stent noted. Electronically Signed   By: Genevive BiStewart  Edmunds M.D.   On: 02/20/2017 18:02   Dg Chest Port 1 View  Result Date: 02/20/2017 CLINICAL DATA:  Hypoxia EXAM: PORTABLE CHEST 1 VIEW COMPARISON:  Study obtained earlier in the day FINDINGS: Endotracheal tube tip is 6.2 cm above the carina. Central catheter tip is in the superior vena cava. Nasogastric tube side port is in the distal esophagus near the gastroesophageal junction. Tip of nasogastric tube is not seen. There is no demonstrable pneumothorax. Lungs are clear. Heart size and pulmonary vascularity are normal. No adenopathy. No evident bone lesions. IMPRESSION: Tube and catheter positions as described without pneumothorax. Note that the nasogastric tube side port is slightly above the gastroesophageal junction. Advise advancing nasogastric tube 5-6 cm. No edema or consolidation.  Heart size normal. Electronically Signed   By: Bretta BangWilliam  Woodruff III M.D.   On: 02/20/2017 21:52    Anti-infectives: Anti-infectives (From admission, onward)   Start     Dose/Rate Route Frequency Ordered Stop   02/21/17 0200  piperacillin-tazobactam (ZOSYN) IVPB 3.375 g  Status:  Discontinued     3.375 g 12.5 mL/hr over 240 Minutes Intravenous Every 8 hours 02/20/17 1814 02/20/17 2115   02/20/17 1815  piperacillin-tazobactam (ZOSYN) IVPB 3.375 g     3.375 g 100 mL/hr over  30 Minutes Intravenous  Once 02/20/17 1812 02/20/17 1925   02/20/17 0300  piperacillin-tazobactam (ZOSYN) IVPB 3.375 g     3.375 g 12.5 mL/hr over 240 Minutes Intravenous Every 8 hours 02/20/17 2117        Assessment/Plan: s/p Procedure(s): EXPLORATORY LAPAROTOMY AND REPAIR, PATCH, OR REMOVE PERFORATION IN BOWEL (N/A)  Continue NPO and NG May start tube feeds slowly tomorrow Wound care Appreciate CCM's help  LOS: 1 day    Elyzabeth Goatley A 02/21/2017

## 2017-02-21 NOTE — Progress Notes (Addendum)
Initial Nutrition Assessment  DOCUMENTATION CODES:   Underweight, Non-severe (moderate) malnutrition in context of social or environmental circumstances  INTERVENTION:   Vital 1.5 @ 20 ml/hr increasing by 10 ml every 4 hours to goal rate of 60 ml/hr  Provides: 2160 kcals, 97 grams protein, 1094 ml free water. Meets 100% of calorie and protein needs.   NUTRITION DIAGNOSIS:   Moderate Malnutrition related to social / environmental circumstances(polysubstance abuse) as evidenced by energy intake < or equal to 50% for > or equal to 1 month, mild fat depletion, moderate fat depletion, moderate muscle depletion.  GOAL:   Patient will meet greater than or equal to 90% of their needs  MONITOR:   Diet advancement, Labs, Weight trends, TF tolerance, I & O's  REASON FOR ASSESSMENT:   Other (Comment)(low BMI)    ASSESSMENT:   Pt with PMH significant for substance abuse and CAD s/p multiple stents. Presents with acute onset abdominal pain and bloody emesis. CT scan revealed peritonitis and with perforated gastric ulcer.    2/10- pt underwent exploratory laparotomy with omental patch of gastric ulcer, Stamm gastrostomy  2/11- pt extubated  Pt remains with NGT. Surgery recommends to begin tube feedings tomorrow (slowly). Spoke with pt at bedside. Reports having poor appetite two days prior to admission. States prior to the two days he consumed two meals and no snacks. Unable to elaborate on daily meals. Pt reports a UBW of 125 lb, and states he has trouble gaining weight. Records indicate pt had fluctuating weights between 125-130 lb throughout the year. Denies any recent wt loss. Pt drinks Ensure sometimes but not consistently. Amendable to Ensure once diet is advanced.    Medications reviewed and include: thiamine, D5 in NS @ 100 ml/hr, fentanyl, IV abx Labs reviewed: Mg 1.3 (L)  NUTRITION - FOCUSED PHYSICAL EXAM:    Most Recent Value  Orbital Region  Mild depletion  Upper Arm Region   Moderate depletion  Thoracic and Lumbar Region  Unable to assess  Buccal Region  Mild depletion  Temple Region  Mild depletion  Clavicle Bone Region  Moderate depletion  Clavicle and Acromion Bone Region  Moderate depletion  Scapular Bone Region  Unable to assess  Dorsal Hand  No depletion  Patellar Region  Moderate depletion  Anterior Thigh Region  Moderate depletion  Posterior Calf Region  Moderate depletion  Edema (RD Assessment)  None  Hair  Reviewed  Eyes  Reviewed  Mouth  Reviewed  Skin  Reviewed  Nails  Reviewed     Diet Order:  Diet NPO time specified  EDUCATION NEEDS:   Not appropriate for education at this time  Skin:  Skin Assessment: Skin Integrity Issues: Skin Integrity Issues:: Incisions Incisions: closed abdomen  Last BM:  PTA  Height:   Ht Readings from Last 1 Encounters:  02/21/17 5\' 11"  (1.803 m)    Weight:   Wt Readings from Last 1 Encounters:  02/21/17 126 lb 1.6 oz (57.2 kg)    Ideal Body Weight:  78.2 kg  BMI:  Body mass index is 17.59 kg/m.  Estimated Nutritional Needs:   Kcal:  1900-2100 kcal/day  Protein:  95-105 g/day  Fluid:  1.9 L/day    [name] RD, LDN Clinical Nutrition Pager # - [phone]

## 2017-02-21 NOTE — Anesthesia Postprocedure Evaluation (Signed)
Anesthesia Post Note  Patient: [name]  Procedure(s) Performed: EXPLORATORY LAPAROTOMY AND REPAIR, PATCH, OR REMOVE PERFORATION IN BOWEL (N/A Abdomen)     Patient location during evaluation: ICU Anesthesia Type: General Level of consciousness: patient remains intubated per anesthesia plan Pain management: pain level controlled Vital Signs Assessment: post-procedure vital signs reviewed and stable Respiratory status: patient remains intubated per anesthesia plan Cardiovascular status: stable Anesthetic complications: no    Last Vitals:  Vitals:   02/20/17 2200 02/20/17 2300  BP: (!) 123/99 (!) 157/117  Pulse: 87 96  Resp: (!) 21 16  Temp:  37.2 C  SpO2: 100% 99%    Last Pain:  Vitals:   02/20/17 2300  TempSrc: Oral  PainSc:                  Claudie Brickhouse

## 2017-02-21 NOTE — Progress Notes (Signed)
CRITICAL VALUE ALERT  Critical Value:  Lactic Acid 3.7  Date & Time Notied:  02/21/17 0045  Provider Notified: Arsenio LoaderSommer  Orders Received/Actions taken: Awaiting orders

## 2017-02-21 NOTE — Procedures (Signed)
Extubation Procedure Note  Patient Details:   Name: [name] DOB: [birth year] MRN: [id number]   Airway Documentation:     Evaluation  O2 sats: stable throughout Complications: No apparent complications Patient did tolerate procedure well. Bilateral Breath Sounds: Clear   Yes   Patient was extubated without any complications, dyspnea or stridor noted. Patient was placed on 4L [place]. Patient was instructed on IS x 5, highest goal reached was 2500mL.  [name], Margaretmary Dysshley L 02/21/2017, 12:07 PM

## 2017-02-21 NOTE — Progress Notes (Signed)
PULMONARY / CRITICAL CARE MEDICINE   Name: GURINDER TORAL MRN: [id number] DOB: Jan 11, 1976    ADMISSION DATE:  02/20/2017 CONSULTATION DATE:  02/20/2017  REFERRING MD:  [facility]  CHIEF COMPLAINT:  Perforated Gastric Ulcer  HISTORY OF PRESENT ILLNESS:  42 year old male with past medical history as below significant for but not limited to bipolar disorder, CAD with multiple stents, tobacco abuse, illicit drug abuse-marijuana, hypertension, and cardiomyopathy admitted on 2/10 by [facility].    Patient was seen in the ER on 2/9 with nausea, vomiting, diarrhea, and abdominal pain and discharged home.  He returned 2/10 with worsening pain with hematemesis.  Abdominal CT showed free air and exam consistent with acute peritonitis.  He was taken to the OR by surgery for exploratory lap and found to have perforated peptic ulcer with placement of gastrostomy tube.   He returns to the ICU to remain intubated and sedated overnight.  PCCM consulted for ventilator management.     SUBJECTIVE:  HTN improved.  Lactate clearing. Scr trending down.    VITAL SIGNS: BP 105/82   Pulse 81   Temp 97.6 F (36.4 C) (Oral)   Resp 16   Ht 5\' 11"  (1.803 m)   Wt 57.2 kg (126 lb 1.6 oz)   SpO2 100%   BMI 17.59 kg/m   HEMODYNAMICS: CVP:  [6 mmHg-9 mmHg] 9 mmHg  VENTILATOR SETTINGS: Vent Mode: PRVC FiO2 (%):  [40 %] 40 % Set Rate:  [16 bmp] 16 bmp Vt Set:  [600 mL] 600 mL PEEP:  [5 cmH20] 5 cmH20 Plateau Pressure:  [10 cmH20-15 cmH20] 15 cmH20  INTAKE / OUTPUT: I/O last 3 completed shifts: In: 5458.2 [I.V.:3891.5; IV Piggyback:1566.7] Out: 1220 [Urine:400; Drains:670; Blood:150]  PHYSICAL EXAMINATION: General: thin male, NAD on WUA, tol PS wean  HEENT: mm moist, ETT  Neuro: sleepy but wakes easily, follows commands, gtts off  CV: s1s2 rrr, no m/r/g PULM: even/non-labored on PS 8/5, lungs bilaterally clear GI: thin, midline dressing- bloody saturated but not extending markings, LUQ Gtube to straight drain-  greenish drainage, right JP drain with sanguineous output, no appreciable BS Extremities: warm/dry, no edema  Skin: no rashes    LABS:  BMET Recent Labs  Lab 02/20/17 1639 02/20/17 1948 02/20/17 2135 02/21/17 0316  NA 138 142 140 139  K 3.8 2.3* 2.8* 3.9  CL 94*  --  104 105  CO2 25  --  23 23  BUN 6  --  6 7  CREATININE 1.34*  --  1.08 1.11  GLUCOSE 161*  --  164* 96    Electrolytes Recent Labs  Lab 02/20/17 1639 02/20/17 2135 02/21/17 0316  CALCIUM 10.2 9.1 8.2*  MG  --  1.3*  --   PHOS  --  4.5  --     CBC Recent Labs  Lab 02/20/17 1639 02/20/17 1948 02/20/17 2135 02/21/17 0316  WBC 5.1  --  2.5* 3.8*  HGB 18.2* 14.6 13.7 14.0  HCT 51.0 43.0 39.7 41.1  PLT 297  --  218 204    Coag's Recent Labs  Lab 02/20/17 2135  INR 1.74    Sepsis Markers Recent Labs  Lab 02/20/17 2135 02/20/17 2346 02/21/17 0316 02/21/17 0727  LATICACIDVEN  --  3.7*  --  1.2  PROCALCITON 3.69  --  13.53  --     ABG Recent Labs  Lab 02/20/17 1948 02/20/17 2208  PHART 7.342* 7.378  PCO2ART 52.3* 48.7*  PO2ART 618.0* 208.0*    Liver Enzymes  Recent Labs  Lab 02/19/17 1114 02/20/17 1639  AST 26 38  ALT 20 22  ALKPHOS 82 69  BILITOT 1.2 1.6*  ALBUMIN 4.5 4.7    Cardiac Enzymes No results for input(s): TROPONINI, PROBNP in the last 168 hours.  Glucose Recent Labs  Lab 02/20/17 2359 02/21/17 0511 02/21/17 0811  GLUCAP 91 85 82    Imaging Ct Abdomen Pelvis Wo Contrast  Result Date: 02/20/2017 CLINICAL DATA:  Nausea vomiting, abdominal pain EXAM: CT ABDOMEN AND PELVIS WITHOUT CONTRAST TECHNIQUE: Multidetector CT imaging of the abdomen and pelvis was performed following the standard protocol without IV contrast. COMPARISON:  CT 06/09/2016 FINDINGS: Lower chest: Lung bases are clear. Hepatobiliary: No focal hepatic lesion. There is extraluminal gas along the inferior margin of the LEFT hepatic lobe and anterior to LEFT hepatic lobe consistent  intraperitoneal free air (image 12, series 5). No pneumobilia. No portal venous gas. Pancreas: Pancreas is normal. No ductal dilatation. No pancreatic inflammation. Spleen: Normal spleen Adrenals/urinary tract: Adrenal glands and kidneys are normal. The ureters and bladder normal. Stomach/Bowel: GI tract is somewhat difficult to evaluate without IV oral contrast. The stomach appears normal. There is extraluminal gas in the gastro hepatic ligament (image 16, series 3). There is extraluminal gas along the second portion the duodenum. This could represent a source of the perforation (image 29, series 6). There is a moderate volume of free fluid in the upper abdomen and lower pelvis which does not localize source. Fluid is low-density There is a thickened loop of small bowel in the LEFT mid abdomen with a 13 mm wall (image 27, series 6). This also could represent source of perforation Cecum is normal. The appendix is not clearly identified. Ascending and transverse colon appear normal. The descending colon and rectosigmoid colon are normal. Vascular/Lymphatic: Abdominal aorta is normal caliber with atherosclerotic calcification. There is no retroperitoneal or periportal lymphadenopathy. No pelvic lymphadenopathy. Reproductive: Prostate normal for age calcifications Seminal vesicles Other: Moderate free fluid Musculoskeletal: No aggressive osseous lesion. IMPRESSION: 1. Intraperitoneal free air and free fluid consistent with bowel perforation. Favor perforation to either be along the second portion duodenum or in the mid small bowel of the LEFT abdomen. There is thickened loops of small bowel in the LEFT abdomen. There is extraluminal gas along the second portion of the duodenum. 2. Exam is somewhat difficult to localize perforation source as no IV oral contrast. 3. Vascular calcifications are advanced for age. 4. Recommend surgical consultation. Critical Value/emergent results were called by telephone at the time of  interpretation on 02/20/2017 at 5:42 pm to Dr. Corlis Leak, Mitzi Hansen, LYN, who verbally acknowledged these results. Electronically Signed   By: Genevive Bi M.D.   On: 02/20/2017 17:46   Dg Chest 2 View  Result Date: 02/20/2017 CLINICAL DATA:  Upper mid back pain abdominal pain. EXAM: CHEST  2 VIEW COMPARISON:  10/19/2016 FINDINGS: Normal mediastinum and cardiac silhouette. Normal pulmonary vasculature. No evidence of effusion, infiltrate, or pneumothorax. No acute bony abnormality. Small amount of intraperitoneal free air beneath the LEFT hemidiaphragm Coronary artery stent noted IMPRESSION: 1.  No acute cardiopulmonary process. 2. Small volume intraperitoneal free air beneath the LEFT hemidiaphragm 3. Coronary artery stent noted. Electronically Signed   By: Genevive Bi M.D.   On: 02/20/2017 18:02   Dg Chest Port 1 View  Result Date: 02/20/2017 CLINICAL DATA:  Hypoxia EXAM: PORTABLE CHEST 1 VIEW COMPARISON:  Study obtained earlier in the day FINDINGS: Endotracheal tube tip is 6.2 cm above the carina.  Central catheter tip is in the superior vena cava. Nasogastric tube side port is in the distal esophagus near the gastroesophageal junction. Tip of nasogastric tube is not seen. There is no demonstrable pneumothorax. Lungs are clear. Heart size and pulmonary vascularity are normal. No adenopathy. No evident bone lesions. IMPRESSION: Tube and catheter positions as described without pneumothorax. Note that the nasogastric tube side port is slightly above the gastroesophageal junction. Advise advancing nasogastric tube 5-6 cm. No edema or consolidation.  Heart size normal. Electronically Signed   By: Bretta BangWilliam  Woodruff III M.D.   On: 02/20/2017 21:52   STUDIES:  2/10 CT abd>>1. Intraperitoneal free air and free fluid consistent with bowel perforation. Favor perforation to either be along the second portion duodenum or in the mid small bowel of the LEFT abdomen. There is thickened loops of small bowel in  the LEFT abdomen. There is extraluminal gas along the second portion of the duodenum. 2. Exam is somewhat difficult to localize perforation source as no IV oral contrast. 3. Vascular calcifications are advanced for age. 4. Recommend surgical consultation.  CULTURES: BC x 2 2/11>>>  ANTIBIOTICS: 2/10 zosyn >>  SIGNIFICANT EVENTS: 2/10 Admit/ OR  LINES/TUBES: 2/10 R IJ TL CVC >> 2/10 Left radial Aline >> 2/10 ETT >> 2/10 Gtube >> 2/10 JP drain- right abd >> 2/10 foley >>  DISCUSSION: 41yoM with hx CAD s/p Mi, HTN, Schizophrenia, Polysubstance abuse, CHF, who is admitted with a perforated gastric ulcer. He went emergently to the OR where exlap was performed yielding copious amounts purulent fluid in all quadrants. Remains vent dependent post op, AKI.    ASSESSMENT / PLAN:  PULMONARY A: Acute respiratory insufficiency in the post- operative setting Tobacco abuse P:   Vent support - 8cc/kg  F/u CXR  F/u ABG Daily SBT - tolerating PS wean - still a bit sleepy but can likely extubate once more awake.  Good vent mechanics VAP prevention    CARDIOVASCULAR A:  Hypertension Hx HTN, CAD w/stents, ICM- EF 40-45% per TTE 06/2016 - reportedly noncompliant with meds P:  Tele monitoring Continue Aline Monitor CVP  Trend lactate  UDS neg for cocaine  Goal MAP > 65 Hold hold meds for now   RENAL A:   AKI with oliguria - improved  Hypokalemia Hypomag P:   Continue gentle IVF Will change MIVF to D5NS @100   F/u chem  Replete electrolytes PRN  Monitor UOP  GASTROINTESTINAL A:   Perforated Gastric ulcer s/p gastrostomy w/ JP/ midline incision Peritonitis  P:   Gtube/ OGT/ JP drain/ Midline incision Per surgery NPO Protonix q 12 hr See ID   HEMATOLOGIC A:   At risk for ABLA Leukopenia  P:  Trend CBC Monitor JP drainage SCDs for VTE ppx  INFECTIOUS A:   Peritonitis  P:   Continue zosyn Follow BC Trend WBC/ fever curve Trend PCT  ENDOCRINE A:   No  acute issues P:   CBG q 4 while NPO  NEUROLOGIC A:   Acute encephalopathy related to sedation Illicit drug abuse -- UDS neg cocaine, POS benzos, THC Hx bipolar disorder- ? Unsure compliant with meds/ on home trazodone P:   RASS goal: -1 PAD protocol with propofol gtt and fentanyl prn ? etoh use, empiric thiamine IV for now Hold propofol, fent gtts for now  PRN fentanyl if extubated   FAMILY  - Updates:  Sister updated at length at bedside 2/11  - Inter-disciplinary family meet or Palliative Care meeting due by:  02/27/2017  Dirk Dress, NP 02/21/2017  9:45 AM Pager: [phone] or [phone]

## 2017-02-21 NOTE — Progress Notes (Signed)
Pt arrived from OR. BP 200's/100's and waking up, was ordered to started fentanyl gtt and if SBP>140 then start cardene gtt. Pt has a JP drain that is putting out a lot of blood. Informed Dr. Dwain SarnaWakefield and was told this is expected. Pt now resting comfortably. Will continue to monitor

## 2017-02-22 ENCOUNTER — Inpatient Hospital Stay (HOSPITAL_COMMUNITY): Payer: Medicare Other

## 2017-02-22 DIAGNOSIS — I351 Nonrheumatic aortic (valve) insufficiency: Secondary | ICD-10-CM

## 2017-02-22 DIAGNOSIS — J9600 Acute respiratory failure, unspecified whether with hypoxia or hypercapnia: Secondary | ICD-10-CM

## 2017-02-22 LAB — BASIC METABOLIC PANEL
Anion gap: 11 (ref 5–15)
BUN: 7 mg/dL (ref 6–20)
CHLORIDE: 105 mmol/L (ref 101–111)
CO2: 26 mmol/L (ref 22–32)
CREATININE: 1.04 mg/dL (ref 0.61–1.24)
Calcium: 8.4 mg/dL — ABNORMAL LOW (ref 8.9–10.3)
GFR calc non Af Amer: >60 mL/min (ref >60)
Glucose, Bld: 84 mg/dL (ref 65–99)
POTASSIUM: 3.8 mmol/L (ref 3.5–5.1)
Sodium: 142 mmol/L (ref 135–145)

## 2017-02-22 LAB — CBC WITH DIFFERENTIAL/PLATELET
BASOS ABS: 0 10*3/uL (ref 0.0–0.1)
Basophils Relative: 0 %
Eosinophils Absolute: 0 10*3/uL (ref 0.0–0.7)
Eosinophils Relative: 0 %
HCT: 38.8 % — ABNORMAL LOW (ref 39.0–52.0)
Hemoglobin: 13 g/dL (ref 13.0–17.0)
LYMPHS PCT: 15 %
Lymphs Abs: 1.2 10*3/uL (ref 0.7–4.0)
MCH: 30.5 pg (ref 26.0–34.0)
MCHC: 33.5 g/dL (ref 30.0–36.0)
MCV: 91.1 fL (ref 78.0–100.0)
MONO ABS: 0.5 10*3/uL (ref 0.1–1.0)
Monocytes Relative: 5 %
NEUTROS ABS: 6.7 10*3/uL (ref 1.7–7.7)
Neutrophils Relative %: 80 %
Platelets: 138 10*3/uL — ABNORMAL LOW (ref 150–400)
RBC: 4.26 MIL/uL (ref 4.22–5.81)
RDW: 16 % — AB (ref 11.5–15.5)
WBC: 8.4 10*3/uL (ref 4.0–10.5)

## 2017-02-22 LAB — PROCALCITONIN: Procalcitonin: 6.83 ng/mL

## 2017-02-22 LAB — ECHOCARDIOGRAM COMPLETE
Height: 71 in
Weight: 2017.6 oz

## 2017-02-22 LAB — GLUCOSE, CAPILLARY
GLUCOSE-CAPILLARY: 96 mg/dL (ref 65–99)
GLUCOSE-CAPILLARY: 93 mg/dL (ref 65–99)
Glucose-Capillary: 82 mg/dL (ref 65–99)

## 2017-02-22 LAB — MAGNESIUM: Magnesium: 2.1 mg/dL (ref 1.7–2.4)

## 2017-02-22 LAB — PHOSPHORUS: PHOSPHORUS: 3 mg/dL (ref 2.5–4.6)

## 2017-02-22 MED ORDER — DIPHENHYDRAMINE HCL 50 MG/ML IJ SOLN
12.5000 mg | Freq: Four times a day (QID) | INTRAMUSCULAR | Status: DC | PRN
  Administered 2017-02-24: 12.5 mg via INTRAVENOUS
  Filled 2017-02-22: qty 1

## 2017-02-22 MED ORDER — NICOTINE 21 MG/24HR TD PT24
21.0000 mg | MEDICATED_PATCH | Freq: Every day | TRANSDERMAL | Status: DC
  Administered 2017-02-22 – 2017-02-28 (×8): 21 mg via TRANSDERMAL
  Filled 2017-02-22 (×8): qty 1

## 2017-02-22 MED ORDER — NALOXONE HCL 0.4 MG/ML IJ SOLN: 0.4000 mg | INTRAMUSCULAR | Status: DC | PRN

## 2017-02-22 MED ORDER — DIPHENHYDRAMINE HCL 12.5 MG/5ML PO ELIX: 12.5000 mg | ORAL_SOLUTION | Freq: Four times a day (QID) | ORAL | Status: DC | PRN

## 2017-02-22 MED ORDER — ONDANSETRON HCL 4 MG/2ML IJ SOLN: 4.0000 mg | Freq: Four times a day (QID) | INTRAMUSCULAR | Status: DC | PRN

## 2017-02-22 MED ORDER — HYDROMORPHONE 1 MG/ML IV SOLN
INTRAVENOUS | Status: DC
  Administered 2017-02-22: 11:00:00 via INTRAVENOUS
  Administered 2017-02-22: 0.3 mg via INTRAVENOUS
  Administered 2017-02-22 – 2017-02-23 (×2): 0.6 mg via INTRAVENOUS
  Administered 2017-02-23: 2 mg via INTRAVENOUS
  Administered 2017-02-23: 0.3 mg via INTRAVENOUS
  Administered 2017-02-23: 0.9 mg via INTRAVENOUS
  Administered 2017-02-23: 3 mg via INTRAVENOUS
  Administered 2017-02-23: 1.2 mg via INTRAVENOUS
  Administered 2017-02-24: 0 mg via INTRAVENOUS
  Administered 2017-02-24: 0.9 mg via INTRAVENOUS
  Administered 2017-02-24: 0 mg via INTRAVENOUS
  Administered 2017-02-24: 3 mg via INTRAVENOUS
  Administered 2017-02-24: 4 mg via INTRAVENOUS
  Administered 2017-02-24 – 2017-02-25 (×2): 0.6 mg via INTRAVENOUS
  Administered 2017-02-25: 2 mg via INTRAVENOUS
  Filled 2017-02-22: qty 25

## 2017-02-22 MED ORDER — SODIUM CHLORIDE 0.9% FLUSH: 9.0000 mL | INTRAVENOUS | Status: DC | PRN

## 2017-02-22 MED ORDER — HYDRALAZINE HCL 20 MG/ML IJ SOLN
5.0000 mg | INTRAMUSCULAR | Status: DC | PRN
  Administered 2017-02-27: 5 mg via INTRAVENOUS
  Filled 2017-02-22: qty 1

## 2017-02-22 NOTE — Progress Notes (Signed)
PULMONARY / CRITICAL CARE MEDICINE   Name: [name] MRN: [id number] DOB: 08-12-1975    ADMISSION DATE:  02/20/2017 CONSULTATION DATE:  02/20/2017  REFERRING MD:  CCS  CHIEF COMPLAINT:  Perforated Gastric Ulcer  HISTORY OF PRESENT ILLNESS:  42 year old male with past medical history as below significant for but not limited to bipolar disorder, CAD with multiple stents, tobacco abuse, illicit drug abuse-marijuana, hypertension, and cardiomyopathy admitted on 2/10 by [facility].    Patient was seen in the ER on 2/9 with nausea, vomiting, diarrhea, and abdominal pain and discharged home.  He returned 2/10 with worsening pain with hematemesis.  Abdominal CT showed free air and exam consistent with acute peritonitis.  He was taken to the OR by surgery for exploratory lap and found to have perforated peptic ulcer with placement of gastrostomy tube.   He returns to the ICU to remain intubated and sedated overnight.  PCCM consulted for ventilator management.     SUBJECTIVE:  He was extubated yesterday and this has been well tolerated. No complaint of dyspnea. Adequate pain control.    VITAL SIGNS: BP (!) 134/106   Pulse 75   Temp 98.2 F (36.8 C) (Oral)   Resp (!) 22   Ht 5\' 11"  (1.803 m)   Wt 126 lb 1.6 oz (57.2 kg)   SpO2 100%   BMI 17.59 kg/m   HEMODYNAMICS: CVP:  [4 mmHg-14 mmHg] 14 mmHg  VENTILATOR SETTINGS: Vent Mode: PSV;CPAP FiO2 (%):  [40 %] 40 % PEEP:  [5 cmH20] 5 cmH20 Pressure Support:  [8 cmH20] 8 cmH20  INTAKE / OUTPUT: I/O last 3 completed shifts: In: 7746.5 [I.V.:5979.8; IV Piggyback:1766.7] Out: 3015 [Urine:1425; Drains:1440; Blood:150]  PHYSICAL EXAMINATION: General: thin male, very appropriate and in good spirits.   CV: s1s2 rrr, no m/r/g PULM: unlabored, symmetric air movement, no wheezes GI: thin, flat soft without unusual tenderness LUQ Gtube to straight drain- greenish drainage, right JP drain with sanguineous output, no appreciable BS Extremities:  warm/dry, no edema  Skin: no rashes    LABS:  BMET Recent Labs  Lab 02/20/17 2135 02/21/17 0316 02/22/17 0539  NA 140 139 142  K 2.8* 3.9 3.8  CL 104 105 105  CO2 23 23 26   BUN 6 7 7   CREATININE 1.08 1.11 1.04  GLUCOSE 164* 96 84    Electrolytes Recent Labs  Lab 02/20/17 2135 02/21/17 0316 02/22/17 0539  CALCIUM 9.1 8.2* 8.4*  MG 1.3*  --  2.1  PHOS 4.5  --  3.0    CBC Recent Labs  Lab 02/20/17 2135 02/21/17 0316 02/22/17 0539  WBC 2.5* 3.8* 8.4  HGB 13.7 14.0 13.0  HCT 39.7 41.1 38.8*  PLT 218 204 138*    Coag's Recent Labs  Lab 02/20/17 2135  INR 1.74    Sepsis Markers Recent Labs  Lab 02/20/17 2135 02/20/17 2346 02/21/17 0316 02/21/17 0727 02/22/17 0539  LATICACIDVEN  --  3.7*  --  1.2  --   PROCALCITON 3.69  --  13.53  --  6.83    ABG Recent Labs  Lab 02/20/17 1948 02/20/17 2208  PHART 7.342* 7.378  PCO2ART 52.3* 48.7*  PO2ART 618.0* 208.0*    Liver Enzymes Recent Labs  Lab 02/19/17 1114 02/20/17 1639  AST 26 38  ALT 20 22  ALKPHOS 82 69  BILITOT 1.2 1.6*  ALBUMIN 4.5 4.7    Cardiac Enzymes No results for input(s): TROPONINI, PROBNP in the last 168 hours.  Glucose Recent Labs  Lab 02/21/17 0511 02/21/17 0811 02/21/17 1210 02/21/17 2352 02/22/17 0340 02/22/17 0748  GLUCAP 85 82 95 105* 82 96    Imaging Dg Chest Portable 1 View  Result Date: 02/22/2017 CLINICAL DATA:  Respiratory failure EXAM: PORTABLE CHEST 1 VIEW COMPARISON:  02/20/2017 FINDINGS: Cardiac shadow is stable. The lungs are well aerated bilaterally. No focal infiltrate or sizable effusion is seen. Right jugular central line is noted. Nasogastric catheter is seen coiled within the stomach. Previously noted endotracheal tube is not as well appreciated on today's exam and likely as been removed. No acute bony abnormality is noted. IMPRESSION: No acute abnormality seen. Electronically Signed   By: Alcide Clever M.D.   On: 02/22/2017 08:04   STUDIES:   2/10 CT abd>>1. Intraperitoneal free air and free fluid consistent with bowel perforation. Favor perforation to either be along the second portion duodenum or in the mid small bowel of the LEFT abdomen. There is thickened loops of small bowel in the LEFT abdomen. There is extraluminal gas along the second portion of the duodenum. 2. Exam is somewhat difficult to localize perforation source as no IV oral contrast. 3. Vascular calcifications are advanced for age. 4. Recommend surgical consultation.  CULTURES: BC x 2 2/11>>>  ANTIBIOTICS: 2/10 zosyn >>  SIGNIFICANT EVENTS: 2/10 Admit/ OR  LINES/TUBES: 2/10 R IJ TL CVC >> 2/10 Left radial Aline >> 2/10 ETT >> 2/10 Gtube >> 2/10 JP drain- right abd >> 2/10 foley >>  DISCUSSION: 42yoM with hx CAD s/p Mi, HTN, Schizophrenia, Polysubstance abuse, CHF, who is admitted with a perforated gastric ulcer. He went emergently to the OR where exlap was performed yielding copious amounts purulent fluid in all quadrants. Remains vent dependent post op, AKI.    ASSESSMENT / PLAN:  PULMONARY A: Acute respiratory insufficiency in the post- operative setting Tobacco abuse P:   Extubation has been well tolerated, pian control adequate for cooperation with pulmonary toilet   CARDIOVASCULAR A:  Hypertension Hx HTN, CAD w/stents, ICM- EF 40-45% per TTE 06/2016 Anxious to resume beta blocker, anti platelet agent and statin     RENAL A:   AKI with oliguria - improved  Hypokalemia Hypomag P:   Continue gentle IVF Will change MIVF to D5NS @100   F/u chem  Replete electrolytes PRN  Monitor UOP  GASTROINTESTINAL A:   Perforated Gastric ulcer s/p gastrostomy w/ JP/ midline incision Peritonitis  P:   Gtube/ OGT/ JP drain/ Midline incision Per surgery NPO Protonix q 12 hr See ID   HEMATOLOGIC A:   At risk for ABLA Leukopenia  P:  Trend CBC Monitor JP drainage SCDs for VTE ppx  INFECTIOUS A:   Peritonitis  P:   Continue  zosyn. Procalcitonin is trending down  ENDOCRINE A:   No acute issues P:   CBG q 4 while NPO  Hx bipolar disorder- ? Unsure compliant with meds/ on home trazodone  Penny Pia, MD 02/22/2017  10:01 AM Pager: [phone] or [phone]

## 2017-02-22 NOTE — Progress Notes (Signed)
Pt did well overnight, still having surgical pain in mid-abdomen. Given prn fentanyl multiple times throughout the night. VS are all within normal limits. Will continue to monitor.

## 2017-02-22 NOTE — Progress Notes (Signed)
2 Days Post-Op   Subjective/Chief Complaint: Doing well Tolerating extubation   Objective: Vital signs in last 24 hours: Temp:  [97.6 F (36.4 C)-98.8 F (37.1 C)] 98.2 F (36.8 C) (02/12 0756) Pulse Rate:  [60-98] 72 (02/12 0700) Resp:  [13-23] 17 (02/12 0700) BP: (120-153)/(94-110) 125/104 (02/12 0700) SpO2:  [99 %-100 %] 100 % (02/12 0700) FiO2 (%):  [40 %] 40 % (02/11 1134) Last BM Date: (pta)  Intake/Output from previous day: 02/11 0701 - 02/12 0700 In: 2288.3 [I.V.:2088.3; IV Piggyback:200] Out: 1795 [Urine:1025; Drains:770] Intake/Output this shift: No intake/output data recorded.  Exam: Awake and alert Comfortable Abdomen soft, drain serosang g-tube with bile  Lab Results:  Recent Labs    02/21/17 0316 02/22/17 0539  WBC 3.8* 8.4  HGB 14.0 13.0  HCT 41.1 38.8*  PLT 204 138*   BMET Recent Labs    02/21/17 0316 02/22/17 0539  NA 139 142  K 3.9 3.8  CL 105 105  CO2 23 26  GLUCOSE 96 84  BUN 7 7  CREATININE 1.11 1.04  CALCIUM 8.2* 8.4*   PT/INR Recent Labs    02/20/17 2135  LABPROT 20.2*  INR 1.74   ABG Recent Labs    02/20/17 1948 02/20/17 2208  PHART 7.342* 7.378  HCO3 28.3* 28.8*    Studies/Results: Ct Abdomen Pelvis Wo Contrast  Result Date: 02/20/2017 CLINICAL DATA:  Nausea vomiting, abdominal pain EXAM: CT ABDOMEN AND PELVIS WITHOUT CONTRAST TECHNIQUE: Multidetector CT imaging of the abdomen and pelvis was performed following the standard protocol without IV contrast. COMPARISON:  CT 06/09/2016 FINDINGS: Lower chest: Lung bases are clear. Hepatobiliary: No focal hepatic lesion. There is extraluminal gas along the inferior margin of the LEFT hepatic lobe and anterior to LEFT hepatic lobe consistent intraperitoneal free air (image 12, series 5). No pneumobilia. No portal venous gas. Pancreas: Pancreas is normal. No ductal dilatation. No pancreatic inflammation. Spleen: Normal spleen Adrenals/urinary tract: Adrenal glands and kidneys  are normal. The ureters and bladder normal. Stomach/Bowel: GI tract is somewhat difficult to evaluate without IV oral contrast. The stomach appears normal. There is extraluminal gas in the gastro hepatic ligament (image 16, series 3). There is extraluminal gas along the second portion the duodenum. This could represent a source of the perforation (image 29, series 6). There is a moderate volume of free fluid in the upper abdomen and lower pelvis which does not localize source. Fluid is low-density There is a thickened loop of small bowel in the LEFT mid abdomen with a 13 mm wall (image 27, series 6). This also could represent source of perforation Cecum is normal. The appendix is not clearly identified. Ascending and transverse colon appear normal. The descending colon and rectosigmoid colon are normal. Vascular/Lymphatic: Abdominal aorta is normal caliber with atherosclerotic calcification. There is no retroperitoneal or periportal lymphadenopathy. No pelvic lymphadenopathy. Reproductive: Prostate normal for age calcifications Seminal vesicles Other: Moderate free fluid Musculoskeletal: No aggressive osseous lesion. IMPRESSION: 1. Intraperitoneal free air and free fluid consistent with bowel perforation. Favor perforation to either be along the second portion duodenum or in the mid small bowel of the LEFT abdomen. There is thickened loops of small bowel in the LEFT abdomen. There is extraluminal gas along the second portion of the duodenum. 2. Exam is somewhat difficult to localize perforation source as no IV oral contrast. 3. Vascular calcifications are advanced for age. 4. Recommend surgical consultation. Critical Value/emergent results were called by telephone at the time of interpretation on 02/20/2017 at  5:42 pm to Dr. Corlis LeakMACKUEN, Mitzi HansenOURTENEY, LYN, who verbally acknowledged these results. Electronically Signed   By: Genevive BiStewart  Edmunds M.D.   On: 02/20/2017 17:46   Dg Chest 2 View  Result Date: 02/20/2017 CLINICAL  DATA:  Upper mid back pain abdominal pain. EXAM: CHEST  2 VIEW COMPARISON:  10/19/2016 FINDINGS: Normal mediastinum and cardiac silhouette. Normal pulmonary vasculature. No evidence of effusion, infiltrate, or pneumothorax. No acute bony abnormality. Small amount of intraperitoneal free air beneath the LEFT hemidiaphragm Coronary artery stent noted IMPRESSION: 1.  No acute cardiopulmonary process. 2. Small volume intraperitoneal free air beneath the LEFT hemidiaphragm 3. Coronary artery stent noted. Electronically Signed   By: Genevive BiStewart  Edmunds M.D.   On: 02/20/2017 18:02   Dg Chest Portable 1 View  Result Date: 02/22/2017 CLINICAL DATA:  Respiratory failure EXAM: PORTABLE CHEST 1 VIEW COMPARISON:  02/20/2017 FINDINGS: Cardiac shadow is stable. The lungs are well aerated bilaterally. No focal infiltrate or sizable effusion is seen. Right jugular central line is noted. Nasogastric catheter is seen coiled within the stomach. Previously noted endotracheal tube is not as well appreciated on today's exam and likely as been removed. No acute bony abnormality is noted. IMPRESSION: No acute abnormality seen. Electronically Signed   By: Alcide CleverMark  Lukens M.D.   On: 02/22/2017 08:04   Dg Chest Port 1 View  Result Date: 02/20/2017 CLINICAL DATA:  Hypoxia EXAM: PORTABLE CHEST 1 VIEW COMPARISON:  Study obtained earlier in the day FINDINGS: Endotracheal tube tip is 6.2 cm above the carina. Central catheter tip is in the superior vena cava. Nasogastric tube side port is in the distal esophagus near the gastroesophageal junction. Tip of nasogastric tube is not seen. There is no demonstrable pneumothorax. Lungs are clear. Heart size and pulmonary vascularity are normal. No adenopathy. No evident bone lesions. IMPRESSION: Tube and catheter positions as described without pneumothorax. Note that the nasogastric tube side port is slightly above the gastroesophageal junction. Advise advancing nasogastric tube 5-6 cm. No edema or  consolidation.  Heart size normal. Electronically Signed   By: Bretta BangWilliam  Woodruff III M.D.   On: 02/20/2017 21:52    Anti-infectives: Anti-infectives (From admission, onward)   Start     Dose/Rate Route Frequency Ordered Stop   02/21/17 0200  piperacillin-tazobactam (ZOSYN) IVPB 3.375 g  Status:  Discontinued     3.375 g 12.5 mL/hr over 240 Minutes Intravenous Every 8 hours 02/20/17 1814 02/20/17 2115   02/20/17 1815  piperacillin-tazobactam (ZOSYN) IVPB 3.375 g     3.375 g 100 mL/hr over 30 Minutes Intravenous  Once 02/20/17 1812 02/20/17 1925   02/20/17 0300  piperacillin-tazobactam (ZOSYN) IVPB 3.375 g     3.375 g 12.5 mL/hr over 240 Minutes Intravenous Every 8 hours 02/20/17 2117        Assessment/Plan: s/p Procedure(s): EXPLORATORY LAPAROTOMY AND REPAIR, PATCH, OR REMOVE PERFORATION IN BOWEL (N/A)  Transfer to med surg floor Continue NG Start PCA D/c foley   LOS: 2 days    [name] A 02/22/2017

## 2017-02-22 NOTE — Progress Notes (Signed)
  Echocardiogram 2D Echocardiogram has been performed.  Delcie RochENNINGTON, Quay Simkin 02/22/2017, 12:51 PM

## 2017-02-23 LAB — CBC WITH DIFFERENTIAL/PLATELET
Basophils Absolute: 0 10*3/uL (ref 0.0–0.1)
Basophils Relative: 0 %
Eosinophils Absolute: 0 10*3/uL (ref 0.0–0.7)
Eosinophils Relative: 0 %
HEMATOCRIT: 32.2 % — AB (ref 39.0–52.0)
Hemoglobin: 10.9 g/dL — ABNORMAL LOW (ref 13.0–17.0)
LYMPHS PCT: 17 %
Lymphs Abs: 1.2 10*3/uL (ref 0.7–4.0)
MCH: 31 pg (ref 26.0–34.0)
MCHC: 33.9 g/dL (ref 30.0–36.0)
MCV: 91.5 fL (ref 78.0–100.0)
MONOS PCT: 4 %
Monocytes Absolute: 0.3 10*3/uL (ref 0.1–1.0)
NEUTROS ABS: 5.6 10*3/uL (ref 1.7–7.7)
Neutrophils Relative %: 79 %
Platelets: 122 10*3/uL — ABNORMAL LOW (ref 150–400)
RBC: 3.52 MIL/uL — ABNORMAL LOW (ref 4.22–5.81)
RDW: 16 % — ABNORMAL HIGH (ref 11.5–15.5)
WBC: 7.1 10*3/uL (ref 4.0–10.5)

## 2017-02-23 LAB — BASIC METABOLIC PANEL
Anion gap: 8 (ref 5–15)
BUN: 5 mg/dL — AB (ref 6–20)
CHLORIDE: 108 mmol/L (ref 101–111)
CO2: 26 mmol/L (ref 22–32)
CREATININE: 0.97 mg/dL (ref 0.61–1.24)
Calcium: 8.3 mg/dL — ABNORMAL LOW (ref 8.9–10.3)
GFR calc Af Amer: >60 mL/min (ref >60)
GFR calc non Af Amer: >60 mL/min (ref >60)
Glucose, Bld: 101 mg/dL — ABNORMAL HIGH (ref 65–99)
Potassium: 3 mmol/L — ABNORMAL LOW (ref 3.5–5.1)
SODIUM: 142 mmol/L (ref 135–145)

## 2017-02-23 LAB — GLUCOSE, CAPILLARY: Glucose-Capillary: 86 mg/dL (ref 65–99)

## 2017-02-23 LAB — PHOSPHORUS: Phosphorus: 1.9 mg/dL — ABNORMAL LOW (ref 2.5–4.6)

## 2017-02-23 LAB — MAGNESIUM: Magnesium: 1.9 mg/dL (ref 1.7–2.4)

## 2017-02-23 MED ORDER — WHITE PETROLATUM EX OINT
TOPICAL_OINTMENT | CUTANEOUS | Status: AC
  Administered 2017-02-23: 23:00:00
  Filled 2017-02-23: qty 28.35

## 2017-02-23 MED ORDER — ENOXAPARIN SODIUM 40 MG/0.4ML ~~LOC~~ SOLN
40.0000 mg | SUBCUTANEOUS | Status: DC
  Administered 2017-02-23 – 2017-02-28 (×6): 40 mg via SUBCUTANEOUS
  Filled 2017-02-23 (×6): qty 0.4

## 2017-02-23 MED ORDER — DEXTROSE-NACL 5-0.9 % IV SOLN
INTRAVENOUS | Status: DC
  Administered 2017-02-23 – 2017-02-26 (×5): via INTRAVENOUS
  Filled 2017-02-23 (×6): qty 1000

## 2017-02-23 MED ORDER — SODIUM CHLORIDE 0.9% FLUSH
10.0000 mL | INTRAVENOUS | Status: DC | PRN
  Administered 2017-02-24 – 2017-02-26 (×2): 10 mL
  Filled 2017-02-23 (×2): qty 40

## 2017-02-23 NOTE — Progress Notes (Signed)
[facility]uma Progress Note  3 Days Post-Op   Assessment/Plan Active Problems:   Peritonitis (HCC)   Perforated gastric ulcer (HCC)  Hx of CAD, NSTEMI, STEMI - S/P stent placement and has not been on plavix for a while - restart Plavix when pt can have PO Hx of Bipolar I Hx of schizophrenia Hx of depression Hx of polysubstance abuse Hx of CHF - ECHO done 02/12  Perforated Gastric ulcer - S/P omental patch, G tube and JP drain placement, Dr. Dwain SarnaWakefield, 02/10 - pt extubated and doing well, transferred to floor 02/12 - PCA for pain control - DC foley  FEN: NGT, IVF, ice chips VTE: SCD's, lovenox ID: Zosyn 02/10>> Foley:  DC Follow up: Dr. Dwain SarnaWakefield  DISPO: NGT til Friday, dc foley, encourage ambulation, can clamp NGT for pt to walk. Restart plavix Friday    LOS: 3 days    Subjective: CC: gastric ulcer  Pt states PCA is controlling his pain. He wants the foley out. He had questions regarding the purpose of the NGT and G tube. He states his sister will help with dressing changes. He states he has not been on any of his regular medications for "a minute". He asked if we would restart those here.   Objective: Vital signs in last 24 hours: Temp:  [98.4 F (36.9 C)-99.1 F (37.3 C)] 99.1 F (37.3 C) (02/13 0501) Pulse Rate:  [70-83] 79 (02/13 0501) Resp:  [13-25] 16 (02/13 0839) BP: (115-151)/(81-106) 130/86 (02/13 0501) SpO2:  [98 %-100 %] 100 % (02/13 0839) Last BM Date: (PTA)  Intake/Output from previous day: 02/12 0701 - 02/13 0700 In: 1330 [P.O.:580; I.V.:700; IV Piggyback:50] Out: 2080 [Urine:750; Emesis/NG output:1100; Drains:230] Intake/Output this shift: Total I/O In: -  Out: 50 [Drains:50]  PE: Gen:  Alert, NAD, pleasant, cooperative Card:  RRR, no M/G/R heard Pulm:  CTAanteriorly, no W/R/R, effort normal Abd: Soft, not distended, +BS, midline incision clean and dressed, drain with minimal serosanguinous drainage, G tube in place,  NGT in place Skin: no rashes noted, warm and dry   Anti-infectives: Anti-infectives (From admission, onward)   Start     Dose/Rate Route Frequency Ordered Stop   02/21/17 0200  piperacillin-tazobactam (ZOSYN) IVPB 3.375 g  Status:  Discontinued     3.375 g 12.5 mL/hr over 240 Minutes Intravenous Every 8 hours 02/20/17 1814 02/20/17 2115   02/20/17 1815  piperacillin-tazobactam (ZOSYN) IVPB 3.375 g     3.375 g 100 mL/hr over 30 Minutes Intravenous  Once 02/20/17 1812 02/20/17 1925   02/20/17 0300  piperacillin-tazobactam (ZOSYN) IVPB 3.375 g     3.375 g 12.5 mL/hr over 240 Minutes Intravenous Every 8 hours 02/20/17 2117        Lab Results:  Recent Labs    02/21/17 0316 02/22/17 0539  WBC 3.8* 8.4  HGB 14.0 13.0  HCT 41.1 38.8*  PLT 204 138*   BMET Recent Labs    02/21/17 0316 02/22/17 0539  NA 139 142  K 3.9 3.8  CL 105 105  CO2 23 26  GLUCOSE 96 84  BUN 7 7  CREATININE 1.11 1.04  CALCIUM 8.2* 8.4*   PT/INR Recent Labs    02/20/17 2135  LABPROT 20.2*  INR 1.74   CMP     Component Value Date/Time   NA 142 02/22/2017 0539   K 3.8 02/22/2017 0539   CL 105 02/22/2017 0539   CO2 26 02/22/2017 0539   GLUCOSE 84 02/22/2017 0539   BUN 7  02/22/2017 0539   CREATININE 1.04 02/22/2017 0539   CALCIUM 8.4 (L) 02/22/2017 0539   PROT 8.0 02/20/2017 1639   ALBUMIN 4.7 02/20/2017 1639   AST 38 02/20/2017 1639   ALT 22 02/20/2017 1639   ALKPHOS 69 02/20/2017 1639   BILITOT 1.6 (H) 02/20/2017 1639   GFRNONAA >60 02/22/2017 0539   GFRAA >60 02/22/2017 0539   Lipase     Component Value Date/Time   LIPASE 23 02/20/2017 1639    Studies/Results: Dg Chest Portable 1 View  Result Date: 02/22/2017 CLINICAL DATA:  Respiratory failure EXAM: PORTABLE CHEST 1 VIEW COMPARISON:  02/20/2017 FINDINGS: Cardiac shadow is stable. The lungs are well aerated bilaterally. No focal infiltrate or sizable effusion is seen. Right jugular central line is noted. Nasogastric catheter is  seen coiled within the stomach. Previously noted endotracheal tube is not as well appreciated on today's exam and likely as been removed. No acute bony abnormality is noted. IMPRESSION: No acute abnormality seen. Electronically Signed   By: Alcide Clever M.D.   On: 02/22/2017 08:04      Jerre Simon , [facility] 02/23/2017, 9:00 AM Pager: [phone] Consults: [phone] Mon-Fri 7:00 am-4:30 pm Sat-Sun 7:00 am-11:30 am

## 2017-02-23 NOTE — Progress Notes (Signed)
Pharmacy Antibiotic Note [name] is a 42 y.o. male admitted on 02/20/2017 with IAA/bowel perforation. Pharmacy has been consulted for Zosyn dosing.  Day #4 of abx for IAA/perforated viscus. S/p omental patch, G tube and JP drain placement on 2/10. Drain output 230mL. Afebrile, WBC wnl.  Plan: Zosyn 3.375g IV q8h (4 hour infusion).  Temp (24hrs), Avg:98.7 F (37.1 C), Min:98.4 F (36.9 C), Max:99.1 F (37.3 C)  Recent Labs  Lab 02/19/17 1114 02/20/17 1639 02/20/17 2135 02/20/17 2346 02/21/17 0316 02/21/17 0727 02/22/17 0539  WBC 4.8 5.1 2.5*  --  3.8*  --  8.4  CREATININE 1.47* 1.34* 1.08  --  1.11  --  1.04  LATICACIDVEN  --   --   --  3.7*  --  1.2  --     Estimated Creatinine Clearance: 77.2 mL/min (by C-G formula based on SCr of 1.04 mg/dL).    Allergies  Allergen Reactions  . Iodine Anaphylaxis and Swelling  . Shellfish Allergy Anaphylaxis    All shellfish  . Contrast Media [Iodinated Diagnostic Agents] Nausea And Vomiting     Thank you for allowing pharmacy to be a part of this patient's care.  Armandina StammerBATCHELDER,Konstance Happel J 02/23/2017 9:34 AM

## 2017-02-23 NOTE — Care Management Note (Signed)
Case Management Note  Patient Details  Name: [name] MRN: [id number] Date of Birth: 07-20-1975  Subjective/Objective:    Pt admitted on 02/20/17 with abdominal pain, N/V; found to have peritonitis and perforated gastric ulcer requiring exp lap with omental patch on 02/20/17.  PTA, pt independent of ADLS; has supportive GF and sister.                 Action/Plan: Will follow for discharge planning as pt progresses.  May need HH follow up for wound care at dc.    Expected Discharge Date:                  Expected Discharge Plan:  Home w Home Health Services  In-House Referral:  Clinical Social Work  Discharge planning Services  CM Consult  Post Acute Care Choice:    Choice offered to:     DME Arranged:    DME Agency:     HH Arranged:    HH Agency:     Status of Service:  In process, will continue to follow  If discussed at Long Length of Stay Meetings, dates discussed:    Additional Comments:  Glennon Macmerson, Jemia Fata M, RN 02/23/2017, 10:26 AM

## 2017-02-24 LAB — CBC WITH DIFFERENTIAL/PLATELET
Basophils Absolute: 0 10*3/uL (ref 0.0–0.1)
Basophils Relative: 0 %
EOS PCT: 1 %
Eosinophils Absolute: 0 10*3/uL (ref 0.0–0.7)
HCT: 32.8 % — ABNORMAL LOW (ref 39.0–52.0)
Hemoglobin: 10.9 g/dL — ABNORMAL LOW (ref 13.0–17.0)
LYMPHS ABS: 0.9 10*3/uL (ref 0.7–4.0)
LYMPHS PCT: 15 %
MCH: 30.3 pg (ref 26.0–34.0)
MCHC: 33.2 g/dL (ref 30.0–36.0)
MCV: 91.1 fL (ref 78.0–100.0)
MONO ABS: 0.5 10*3/uL (ref 0.1–1.0)
Monocytes Relative: 8 %
Neutro Abs: 4.7 10*3/uL (ref 1.7–7.7)
Neutrophils Relative %: 76 %
PLATELETS: 134 10*3/uL — AB (ref 150–400)
RBC: 3.6 MIL/uL — AB (ref 4.22–5.81)
RDW: 15.4 % (ref 11.5–15.5)
WBC: 6.1 10*3/uL (ref 4.0–10.5)

## 2017-02-24 LAB — BASIC METABOLIC PANEL
Anion gap: 8 (ref 5–15)
BUN: 6 mg/dL (ref 6–20)
CALCIUM: 8.2 mg/dL — AB (ref 8.9–10.3)
CO2: 24 mmol/L (ref 22–32)
CREATININE: 0.96 mg/dL (ref 0.61–1.24)
Chloride: 111 mmol/L (ref 101–111)
GFR calc Af Amer: >60 mL/min (ref >60)
GFR calc non Af Amer: >60 mL/min (ref >60)
GLUCOSE: 95 mg/dL (ref 65–99)
Potassium: 3.3 mmol/L — ABNORMAL LOW (ref 3.5–5.1)
Sodium: 143 mmol/L (ref 135–145)

## 2017-02-24 LAB — MAGNESIUM: Magnesium: 1.8 mg/dL (ref 1.7–2.4)

## 2017-02-24 LAB — PHOSPHORUS: Phosphorus: 2.1 mg/dL — ABNORMAL LOW (ref 2.5–4.6)

## 2017-02-24 MED ORDER — POTASSIUM PHOSPHATES 15 MMOLE/5ML IV SOLN
20.0000 meq | Freq: Once | INTRAVENOUS | Status: AC
  Administered 2017-02-24: 20 meq via INTRAVENOUS
  Filled 2017-02-24: qty 4.55

## 2017-02-24 NOTE — Progress Notes (Signed)
Pt having a hard time sleeping. Pt saying he is having a schizophrenic attack. Benadryl IV given for sleep, per MD.

## 2017-02-24 NOTE — Progress Notes (Signed)
[facility] Progress Note  4 Days Post-Op  Subjective: CC-  No complaints. States that his abdominal pain is about the same, well controlled with PCA. Denies n/v. He does feel bloated. No flatus or BM.  TMAX 101.7 yesterday morning, no fevers since then. Patient denies any increased abdominal pain, SOB, cough, CP, dysuria. WBC WNL today. He is pulling IS all the way to the top.  Objective: Vital signs in last 24 hours: Temp:  [98.4 F (36.9 C)-101.9 F (38.8 C)] 99.1 F (37.3 C) (02/14 0439) Pulse Rate:  [67-85] 67 (02/14 0439) Resp:  [16-18] 18 (02/14 0439) BP: (133-147)/(86-102) 141/86 (02/14 0439) SpO2:  [98 %-100 %] 100 % (02/14 0439) Last BM Date: (PTA)  Intake/Output from previous day: 02/13 0701 - 02/14 0700 In: 1125 [P.O.:150; I.V.:760; IV Piggyback:100] Out: 2575 [Urine:450; Emesis/NG output:1700; Drains:425] Intake/Output this shift: No intake/output data recorded.  PE: Gen:  Alert, NAD, pleasant HEENT: EOM's intact, pupils equal and round Card:  RRR, no M/G/R heard. 2+ DP pulses Pulm:  CTAB, no W/R/R, effort normal Abd: Soft, mild distension, +BS, open midline incision with beefy red tissue and no surrounding erythema or drainage/distal aspect of wound closing, JP drain with serosanguinous drainage. G tube to gravity Ext:  Calves soft and nontender Skin: no rashes noted, warm and dry  Lab Results:  Recent Labs    02/23/17 1010 02/24/17 0525  WBC 7.1 6.1  HGB 10.9* 10.9*  HCT 32.2* 32.8*  PLT 122* 134*   BMET Recent Labs    02/23/17 1010 02/24/17 0525  NA 142 143  K 3.0* 3.3*  CL 108 111  CO2 26 24  GLUCOSE 101* 95  BUN 5* 6  CREATININE 0.97 0.96  CALCIUM 8.3* 8.2*   PT/INR No results for input(s): LABPROT, INR in the last 72 hours. CMP     Component Value Date/Time   NA 143 02/24/2017 0525   K 3.3 (L) 02/24/2017 0525   CL 111 02/24/2017 0525   CO2 24 02/24/2017 0525   GLUCOSE 95 02/24/2017 0525   BUN 6 02/24/2017 0525   CREATININE 0.96 02/24/2017 0525   CALCIUM 8.2 (L) 02/24/2017 0525   PROT 8.0 02/20/2017 1639   ALBUMIN 4.7 02/20/2017 1639   AST 38 02/20/2017 1639   ALT 22 02/20/2017 1639   ALKPHOS 69 02/20/2017 1639   BILITOT 1.6 (H) 02/20/2017 1639   GFRNONAA >60 02/24/2017 0525   GFRAA >60 02/24/2017 0525   Lipase     Component Value Date/Time   LIPASE 23 02/20/2017 1639       Studies/Results: No results found.  Anti-infectives: Anti-infectives (From admission, onward)   Start     Dose/Rate Route Frequency Ordered Stop   02/21/17 0200  piperacillin-tazobactam (ZOSYN) IVPB 3.375 g  Status:  Discontinued     3.375 g 12.5 mL/hr over 240 Minutes Intravenous Every 8 hours 02/20/17 1814 02/20/17 2115   02/20/17 1815  piperacillin-tazobactam (ZOSYN) IVPB 3.375 g     3.375 g 100 mL/hr over 30 Minutes Intravenous  Once 02/20/17 1812 02/20/17 1925   02/20/17 0300  piperacillin-tazobactam (ZOSYN) IVPB 3.375 g     3.375 g 12.5 mL/hr over 240 Minutes Intravenous Every 8 hours 02/20/17 2117         Assessment/Plan Hx of CAD, NSTEMI, STEMI - S/P stent placement and has not been on plavix for a while - restart Plavix when pt can have PO Hx of Bipolar I Hx of schizophrenia Hx of depression Hx of  polysubstance abuse Hx of CHF - ECHO done 02/12, LV EF 45-50%   Perforated Gastric ulcer S/P omental patch, G tube and JP drain placement, Dr. Dwain Sarna, 02/10 - POD 4 - PCA for pain control - JP drain serosanguinous with 375cc/24hr - no flatus or BM - Discussed with MD, will d/c NG tube today. Keep G-tube to gravity. Plan for UGI through G-tube tomorrow. Replace potassium and phosphorous. Labs in AM.   FEN: IVF, ice chips, G-tube to gravity VTE: SCD's, lovenox ID: Zosyn 02/10>> Foley:  d/c 2/13 Follow up: Dr. Dwain Sarna    LOS: 4 days    Franne Forts , [facility] 02/24/2017, 8:01 AM Pager: [phone] Consults: [phone] Mon-Fri 7:00 am-4:30 pm Sat-Sun  7:00 am-11:30 am

## 2017-02-24 NOTE — Care Management Important Message (Signed)
Important Message  Patient Details  Name: [name] J Tropea MRN: [id number] Date of Birth: 11-07-1975   Medicare Important Message Given:  Yes    Dorena BodoIris Ceyda Peterka 02/24/2017, 11:59 AM

## 2017-02-25 ENCOUNTER — Inpatient Hospital Stay (HOSPITAL_COMMUNITY): Payer: Medicare Other

## 2017-02-25 LAB — CBC WITH DIFFERENTIAL/PLATELET
BASOS ABS: 0 10*3/uL (ref 0.0–0.1)
Basophils Relative: 0 %
EOS ABS: 0.1 10*3/uL (ref 0.0–0.7)
EOS PCT: 2 %
HCT: 35.3 % — ABNORMAL LOW (ref 39.0–52.0)
Hemoglobin: 11.7 g/dL — ABNORMAL LOW (ref 13.0–17.0)
Lymphocytes Relative: 31 %
Lymphs Abs: 1.4 10*3/uL (ref 0.7–4.0)
MCH: 30.2 pg (ref 26.0–34.0)
MCHC: 33.1 g/dL (ref 30.0–36.0)
MCV: 91 fL (ref 78.0–100.0)
MONO ABS: 0.6 10*3/uL (ref 0.1–1.0)
Monocytes Relative: 15 %
Neutro Abs: 2.3 10*3/uL (ref 1.7–7.7)
Neutrophils Relative %: 52 %
PLATELETS: 163 10*3/uL (ref 150–400)
RBC: 3.88 MIL/uL — AB (ref 4.22–5.81)
RDW: 15.6 % — ABNORMAL HIGH (ref 11.5–15.5)
WBC: 4.3 10*3/uL (ref 4.0–10.5)

## 2017-02-25 LAB — BASIC METABOLIC PANEL
ANION GAP: 9 (ref 5–15)
BUN: 7 mg/dL (ref 6–20)
CALCIUM: 8.7 mg/dL — AB (ref 8.9–10.3)
CO2: 22 mmol/L (ref 22–32)
CREATININE: 0.97 mg/dL (ref 0.61–1.24)
Chloride: 110 mmol/L (ref 101–111)
GFR calc Af Amer: >60 mL/min (ref >60)
Glucose, Bld: 99 mg/dL (ref 65–99)
Potassium: 3.8 mmol/L (ref 3.5–5.1)
SODIUM: 141 mmol/L (ref 135–145)

## 2017-02-25 LAB — MAGNESIUM: MAGNESIUM: 1.8 mg/dL (ref 1.7–2.4)

## 2017-02-25 LAB — PHOSPHORUS: Phosphorus: 2.6 mg/dL (ref 2.5–4.6)

## 2017-02-25 MED ORDER — IOPAMIDOL (ISOVUE-300) INJECTION 61%
150.0000 mL | Freq: Once | INTRAVENOUS | Status: AC | PRN
  Administered 2017-02-25: 150 mL via ORAL

## 2017-02-25 MED ORDER — DOCUSATE SODIUM 100 MG PO CAPS
100.0000 mg | ORAL_CAPSULE | Freq: Every day | ORAL | Status: DC
  Administered 2017-02-26 – 2017-02-28 (×3): 100 mg via ORAL
  Filled 2017-02-25 (×3): qty 1

## 2017-02-25 MED ORDER — IOPAMIDOL (ISOVUE-300) INJECTION 61%
INTRAVENOUS | Status: AC
  Filled 2017-02-25: qty 150

## 2017-02-25 MED ORDER — OXYCODONE HCL 5 MG PO TABS
5.0000 mg | ORAL_TABLET | ORAL | Status: DC | PRN
  Administered 2017-02-25 – 2017-02-26 (×5): 10 mg via ORAL
  Administered 2017-02-26 – 2017-02-27 (×2): 5 mg via ORAL
  Administered 2017-02-27: 10 mg via ORAL
  Administered 2017-02-27: 5 mg via ORAL
  Administered 2017-02-28: 10 mg via ORAL
  Administered 2017-02-28: 5 mg via ORAL
  Filled 2017-02-25 (×3): qty 2
  Filled 2017-02-25: qty 1
  Filled 2017-02-25: qty 2
  Filled 2017-02-25 (×2): qty 1
  Filled 2017-02-25 (×3): qty 2
  Filled 2017-02-25: qty 1

## 2017-02-25 MED ORDER — BOOST / RESOURCE BREEZE PO LIQD CUSTOM
1.0000 | Freq: Three times a day (TID) | ORAL | Status: DC
  Administered 2017-02-25 – 2017-02-26 (×4): 1 via ORAL

## 2017-02-25 MED ORDER — POLYETHYLENE GLYCOL 3350 17 G PO PACK
17.0000 g | PACK | Freq: Every day | ORAL | Status: DC
  Administered 2017-02-26 – 2017-02-28 (×3): 17 g via ORAL
  Filled 2017-02-25 (×3): qty 1

## 2017-02-25 MED ORDER — HYDROMORPHONE HCL 1 MG/ML IJ SOLN
1.0000 mg | INTRAMUSCULAR | Status: DC | PRN
  Administered 2017-02-26: 1 mg via INTRAVENOUS
  Filled 2017-02-25: qty 1

## 2017-02-25 NOTE — Progress Notes (Signed)
[facility]/Trauma Progress Note  5 Days Post-Op   Assessment/Plan Hx of CAD, NSTEMI, STEMI - S/P stent placement and has not been on plavix for a while - restart Plavixwhen pt can have PO Hx of Bipolar I Hx of schizophrenia Hx of depression Hx of polysubstance abuse Hx of CHF - ECHO done 02/12, LV EF 45-50%  Perforated Gastric ulcer S/P omental patch, G tube and JP drain placement, Dr. Dwain Sarna, 02/10 - JP drain serosanguinous with 635cc/24hr - + BM and flatus - UGI through G-tube tomorrow showed no leak start on clears. Labs in AM. PO pain meds, DC PCA, encourage ambulation   ZOX:WRUEAV, G-tube clamped VTE: SCD's, lovenox WU:JWJXB 02/10>> Foley:d/c 2/13 Follow up:Dr. Dwain Sarna   LOS: 5 days    Subjective: CC: mild abdominal pain  No nausea or vomiting. Had a BM and having flatus. No new complaints. No pain or swelling in calves b/l.  Objective: Vital signs in last 24 hours: Temp:  [98 F (36.7 C)-98.4 F (36.9 C)] 98 F (36.7 C) (02/15 0341) Pulse Rate:  [63-78] 63 (02/15 0341) Resp:  [16-20] 16 (02/15 0754) BP: (143-146)/(88-92) 143/88 (02/15 0341) SpO2:  [98 %-100 %] 100 % (02/15 0754) Last BM Date: 02/17/17  Intake/Output from previous day: 02/14 0701 - 02/15 0700 In: 2683.3 [P.O.:520; I.V.:1758.8; IV Piggyback:404.6] Out: 1335 [Urine:300; Emesis/NG output:400; Drains:635] Intake/Output this shift: No intake/output data recorded.  PE: Gen:  Alert, NAD, pleasant, cooperative Card:  RRR, no M/G/R heard Pulm:  CTAanteriorly, no W/R/R, effort normal Abd: Soft, not distended, +BS, midline incision clean and dressed, drain with moderate serosanguinous drainage, G tube in place, Mild TTP at site of G tube Skin: no rashes noted, warm and dry Extremities: no pain or swelling in calves b/l  Anti-infectives: Anti-infectives (From admission, onward)   Start     Dose/Rate Route Frequency Ordered Stop   02/21/17 0200   piperacillin-tazobactam (ZOSYN) IVPB 3.375 g  Status:  Discontinued     3.375 g 12.5 mL/hr over 240 Minutes Intravenous Every 8 hours 02/20/17 1814 02/20/17 2115   02/20/17 1815  piperacillin-tazobactam (ZOSYN) IVPB 3.375 g     3.375 g 100 mL/hr over 30 Minutes Intravenous  Once 02/20/17 1812 02/20/17 1925   02/20/17 0300  piperacillin-tazobactam (ZOSYN) IVPB 3.375 g     3.375 g 12.5 mL/hr over 240 Minutes Intravenous Every 8 hours 02/20/17 2117        Lab Results:  Recent Labs    02/24/17 0525 02/25/17 0407  WBC 6.1 4.3  HGB 10.9* 11.7*  HCT 32.8* 35.3*  PLT 134* 163   BMET Recent Labs    02/24/17 0525 02/25/17 0407  NA 143 141  K 3.3* 3.8  CL 111 110  CO2 24 22  GLUCOSE 95 99  BUN 6 7  CREATININE 0.96 0.97  CALCIUM 8.2* 8.7*   PT/INR No results for input(s): LABPROT, INR in the last 72 hours. CMP     Component Value Date/Time   NA 141 02/25/2017 0407   K 3.8 02/25/2017 0407   CL 110 02/25/2017 0407   CO2 22 02/25/2017 0407   GLUCOSE 99 02/25/2017 0407   BUN 7 02/25/2017 0407   CREATININE 0.97 02/25/2017 0407   CALCIUM 8.7 (L) 02/25/2017 0407   PROT 8.0 02/20/2017 1639   ALBUMIN 4.7 02/20/2017 1639   AST 38 02/20/2017 1639   ALT 22 02/20/2017 1639   ALKPHOS 69 02/20/2017 1639   BILITOT 1.6 (H) 02/20/2017 1639   GFRNONAA >  60 02/25/2017 0407   GFRAA >60 02/25/2017 0407   Lipase     Component Value Date/Time   LIPASE 23 02/20/2017 1639    Studies/Results: Dg Kayleen MemosUgi W/water Sol Cm  Result Date: 02/25/2017 CLINICAL DATA:  Repaired perforated gastric ulcer along the lesser curvature. EXAM: WATER SOLUBLE UPPER GI SERIES WITH KUB TECHNIQUE: Single-column upper GI series was performed using water soluble contrast. CONTRAST:  150 cc of Isovue given through the gastrostomy tube. COMPARISON:  CT scan dated 02/20/2017 FLUOROSCOPY TIME:  Fluoroscopy Time:  2 minutes 6 seconds FINDINGS: The scout image demonstrates a gastrostomy tube in place as well as a drain in  the upper abdomen. There are slightly distended loops of small bowel. The colon is not distended. Stomach is not distended. Contrast was injected in the stomach and the patient was placed in the right and left lateral decubitus positions as well as supine position. There is no evidence of leakage of contrast from the stomach or duodenum. There is a small focal area which appears to be a ulceration in the pyloric channel best seen on image 1 of series 7. I am unable to identify a residual ulceration along the lesser curvature. IMPRESSION: 1. No evidence of leakage of contrast from the stomach or duodenum. 2. Focal ulceration or scarring in the pyloric channel. Electronically Signed   By: Francene BoyersJames  Maxwell M.D.   On: 02/25/2017 09:01      Jerre SimonJessica L Jed Kutch , [facility] 02/25/2017, 10:45 AM Pager: [phone] Consults: [phone] Mon-Fri 7:00 am-4:30 pm Sat-Sun 7:00 am-11:30 am

## 2017-02-25 NOTE — Progress Notes (Signed)
Nutrition Follow-up  DOCUMENTATION CODES:   Underweight, Non-severe (moderate) malnutrition in context of social or environmental circumstances  INTERVENTION:   -Boost Breeze po TID, each supplement provides 250 kcal and 9 grams of protein -RD will follow for diet advancement -If prolonged NPO/clear liquid diet status is anticipated, consider intitiation of nutrition support. If TF is required, recommend initiate Vital 1.5 @ 20 ml/hr increasing by 10 ml every 4 hours to goal rate of 60 ml/hr; regimen provides 2160 kcals, 97 grams protein, 1094 ml free water, meeting 100% of estimated kcal and protein needs.  NUTRITION DIAGNOSIS:   Moderate Malnutrition related to social / environmental circumstances(polysubstance abuse) as evidenced by energy intake < or equal to 50% for > or equal to 1 month, mild fat depletion, moderate fat depletion, moderate muscle depletion.  Ongoing  GOAL:   Patient will meet greater than or equal to 90% of their needs  Progressing  MONITOR:   Diet advancement, Labs, Weight trends, TF tolerance, I & O's  REASON FOR ASSESSMENT:   Other (Comment)(low BMI)    ASSESSMENT:   Pt with PMH significant for substance abuse and CAD s/p multiple stents. Presents with acute onset abdominal pain and bloody emesis. CT scan revealed peritonitis and with perforatedgastric ulcer.   2/10- pt underwent exploratory laparotomy with omental patch of gastric ulcer, Stamm gastrostomy  2/11- pt extubated 2/14- NGT removed  Per general surgery notes, upper GI revealed no leak.   Nurse tech assisting pt at time of visit. Case discussed with RN, who report G-tube is currently clamped and is awaiting further orders from surgery. Noted pt was advanced to clear liquids after RD visit. Pt has been NPO x 5 days; will add Boost Breeze for additional calories and protein.   Labs reviewed.   Diet Order:  Diet clear liquid Room service appropriate? Yes; Fluid consistency:  Thin  EDUCATION NEEDS:   Not appropriate for education at this time  Skin:  Skin Assessment: Skin Integrity Issues: Skin Integrity Issues:: Incisions Incisions: closed abdomen  Last BM:  02/17/17  Height:   Ht Readings from Last 1 Encounters:  02/21/17 5\' 11"  (1.803 m)    Weight:   Wt Readings from Last 1 Encounters:  02/21/17 126 lb 1.6 oz (57.2 kg)    Ideal Body Weight:  78.2 kg  BMI:  Body mass index is 17.59 kg/m.  Estimated Nutritional Needs:   Kcal:  2000-2200 kcal/day  Protein:  95-105 g/day  Fluid:  1.9 L/day    Jiya Kissinger A. Mayford KnifeWilliams, RD, LDN, CDE Pager: [phone] After hours Pager: [phone]

## 2017-02-26 LAB — CBC WITH DIFFERENTIAL/PLATELET
Basophils Absolute: 0 10*3/uL (ref 0.0–0.1)
Basophils Relative: 1 %
EOS ABS: 0.1 10*3/uL (ref 0.0–0.7)
Eosinophils Relative: 4 %
HCT: 32.8 % — ABNORMAL LOW (ref 39.0–52.0)
HEMOGLOBIN: 11 g/dL — AB (ref 13.0–17.0)
LYMPHS PCT: 48 %
Lymphs Abs: 1.4 10*3/uL (ref 0.7–4.0)
MCH: 30.1 pg (ref 26.0–34.0)
MCHC: 33.5 g/dL (ref 30.0–36.0)
MCV: 89.6 fL (ref 78.0–100.0)
Monocytes Absolute: 0.5 10*3/uL (ref 0.1–1.0)
Monocytes Relative: 15 %
NEUTROS PCT: 32 %
Neutro Abs: 1 10*3/uL — ABNORMAL LOW (ref 1.7–7.7)
PLATELETS: 159 10*3/uL (ref 150–400)
RBC: 3.66 MIL/uL — AB (ref 4.22–5.81)
RDW: 15.2 % (ref 11.5–15.5)
WBC: 3 10*3/uL — AB (ref 4.0–10.5)

## 2017-02-26 LAB — BASIC METABOLIC PANEL
ANION GAP: 9 (ref 5–15)
BUN: 6 mg/dL (ref 6–20)
CHLORIDE: 110 mmol/L (ref 101–111)
CO2: 21 mmol/L — ABNORMAL LOW (ref 22–32)
Calcium: 8.4 mg/dL — ABNORMAL LOW (ref 8.9–10.3)
Creatinine, Ser: 1.02 mg/dL (ref 0.61–1.24)
GFR calc Af Amer: >60 mL/min (ref >60)
GFR calc non Af Amer: >60 mL/min (ref >60)
GLUCOSE: 92 mg/dL (ref 65–99)
POTASSIUM: 3.4 mmol/L — AB (ref 3.5–5.1)
SODIUM: 140 mmol/L (ref 135–145)

## 2017-02-26 LAB — CULTURE, BLOOD (ROUTINE X 2)
CULTURE: NO GROWTH
Culture: NO GROWTH
Special Requests: ADEQUATE
Special Requests: ADEQUATE

## 2017-02-26 LAB — MAGNESIUM: MAGNESIUM: 1.5 mg/dL — AB (ref 1.7–2.4)

## 2017-02-26 LAB — PHOSPHORUS: Phosphorus: 2.6 mg/dL (ref 2.5–4.6)

## 2017-02-26 MED ORDER — CLOPIDOGREL BISULFATE 75 MG PO TABS
75.0000 mg | ORAL_TABLET | Freq: Every day | ORAL | Status: DC
  Administered 2017-02-26 – 2017-02-28 (×3): 75 mg via ORAL
  Filled 2017-02-26 (×3): qty 1

## 2017-02-26 MED ORDER — ARIPIPRAZOLE 10 MG PO TABS
15.0000 mg | ORAL_TABLET | Freq: Every day | ORAL | Status: DC
  Administered 2017-02-26 – 2017-02-28 (×3): 15 mg via ORAL
  Filled 2017-02-26 (×3): qty 2

## 2017-02-26 MED ORDER — METOPROLOL SUCCINATE ER 25 MG PO TB24
12.5000 mg | ORAL_TABLET | Freq: Every day | ORAL | Status: DC
  Administered 2017-02-26 – 2017-02-28 (×3): 12.5 mg via ORAL
  Filled 2017-02-26 (×3): qty 1

## 2017-02-26 MED ORDER — ROSUVASTATIN CALCIUM 40 MG PO TABS
40.0000 mg | ORAL_TABLET | Freq: Every day | ORAL | Status: DC
  Administered 2017-02-26 – 2017-02-28 (×3): 40 mg via ORAL
  Filled 2017-02-26 (×3): qty 1

## 2017-02-26 MED ORDER — ONDANSETRON HCL 4 MG/2ML IJ SOLN
4.0000 mg | Freq: Three times a day (TID) | INTRAMUSCULAR | Status: DC | PRN
  Administered 2017-02-26 – 2017-02-28 (×3): 4 mg via INTRAVENOUS
  Filled 2017-02-26 (×3): qty 2

## 2017-02-26 MED ORDER — TRAZODONE HCL 100 MG PO TABS
100.0000 mg | ORAL_TABLET | Freq: Every evening | ORAL | Status: DC | PRN
  Administered 2017-02-26 – 2017-02-27 (×2): 100 mg via ORAL
  Filled 2017-02-26 (×2): qty 1

## 2017-02-26 MED ORDER — LISINOPRIL 5 MG PO TABS
5.0000 mg | ORAL_TABLET | Freq: Every day | ORAL | Status: DC
  Administered 2017-02-26 – 2017-02-28 (×3): 5 mg via ORAL
  Filled 2017-02-26 (×3): qty 1

## 2017-02-26 MED ORDER — MAGNESIUM OXIDE 400 (241.3 MG) MG PO TABS
400.0000 mg | ORAL_TABLET | Freq: Once | ORAL | Status: AC
  Administered 2017-02-26: 400 mg via ORAL
  Filled 2017-02-26: qty 1

## 2017-02-26 MED ORDER — SACCHAROMYCES BOULARDII 250 MG PO CAPS
250.0000 mg | ORAL_CAPSULE | Freq: Two times a day (BID) | ORAL | Status: DC
  Administered 2017-02-26 – 2017-02-28 (×5): 250 mg via ORAL
  Filled 2017-02-26 (×5): qty 1

## 2017-02-26 NOTE — Progress Notes (Signed)
[facility] Progress Note  6 Days Post-Op  Subjective: CC-  C/O insomnia, dressing is irritating him. States that his abdominal pain is well controlled with PCA. Denies n/v. Having BM's.   Objective: Vital signs in last 24 hours: Temp:  [97.3 F (36.3 C)-98.2 F (36.8 C)] 97.3 F (36.3 C) (02/16 0457) Pulse Rate:  [54-61] 61 (02/16 0457) Resp:  [16-17] 17 (02/16 0457) BP: (142-161)/(88-102) 147/92 (02/16 0457) SpO2:  [100 %] 100 % (02/16 0457) Last BM Date: 02/25/17  Intake/Output from previous day: 02/15 0701 - 02/16 0700 In: 450 [P.O.:440; I.V.:10] Out: 900 [Drains:500; Stool:400] Intake/Output this shift: No intake/output data recorded.  PE: Gen:  Alert, NAD, pleasant HEENT: EOM's intact, pupils equal and round Abd: Soft, mild distension, +BS, open, packed midline incision   JP drain with serosanguinous drainage. G tube clamped Skin: no rashes noted, warm and dry  Lab Results:  Recent Labs    02/25/17 0407 02/26/17 0420  WBC 4.3 3.0*  HGB 11.7* 11.0*  HCT 35.3* 32.8*  PLT 163 159   BMET Recent Labs    02/25/17 0407 02/26/17 0420  NA 141 140  K 3.8 3.4*  CL 110 110  CO2 22 21*  GLUCOSE 99 92  BUN 7 6  CREATININE 0.97 1.02  CALCIUM 8.7* 8.4*   PT/INR No results for input(s): LABPROT, INR in the last 72 hours. CMP     Component Value Date/Time   NA 140 02/26/2017 0420   K 3.4 (L) 02/26/2017 0420   CL 110 02/26/2017 0420   CO2 21 (L) 02/26/2017 0420   GLUCOSE 92 02/26/2017 0420   BUN 6 02/26/2017 0420   CREATININE 1.02 02/26/2017 0420   CALCIUM 8.4 (L) 02/26/2017 0420   PROT 8.0 02/20/2017 1639   ALBUMIN 4.7 02/20/2017 1639   AST 38 02/20/2017 1639   ALT 22 02/20/2017 1639   ALKPHOS 69 02/20/2017 1639   BILITOT 1.6 (H) 02/20/2017 1639   GFRNONAA >60 02/26/2017 0420   GFRAA >60 02/26/2017 0420   Lipase     Component Value Date/Time   LIPASE 23 02/20/2017 1639       Studies/Results: Dg Kayleen Memos W/water Sol Cm  Result  Date: 02/25/2017 CLINICAL DATA:  Repaired perforated gastric ulcer along the lesser curvature. EXAM: WATER SOLUBLE UPPER GI SERIES WITH KUB TECHNIQUE: Single-column upper GI series was performed using water soluble contrast. CONTRAST:  150 cc of Isovue given through the gastrostomy tube. COMPARISON:  CT scan dated 02/20/2017 FLUOROSCOPY TIME:  Fluoroscopy Time:  2 minutes 6 seconds FINDINGS: The scout image demonstrates a gastrostomy tube in place as well as a drain in the upper abdomen. There are slightly distended loops of small bowel. The colon is not distended. Stomach is not distended. Contrast was injected in the stomach and the patient was placed in the right and left lateral decubitus positions as well as supine position. There is no evidence of leakage of contrast from the stomach or duodenum. There is a small focal area which appears to be a ulceration in the pyloric channel best seen on image 1 of series 7. I am unable to identify a residual ulceration along the lesser curvature. IMPRESSION: 1. No evidence of leakage of contrast from the stomach or duodenum. 2. Focal ulceration or scarring in the pyloric channel. Electronically Signed   By: Francene Boyers M.D.   On: 02/25/2017 09:01    Anti-infectives: Anti-infectives (From admission, onward)   Start     Dose/Rate Route Frequency  Ordered Stop   02/21/17 0200  piperacillin-tazobactam (ZOSYN) IVPB 3.375 g  Status:  Discontinued     3.375 g 12.5 mL/hr over 240 Minutes Intravenous Every 8 hours 02/20/17 1814 02/20/17 2115   02/20/17 1815  piperacillin-tazobactam (ZOSYN) IVPB 3.375 g     3.375 g 100 mL/hr over 30 Minutes Intravenous  Once 02/20/17 1812 02/20/17 1925   02/20/17 0300  piperacillin-tazobactam (ZOSYN) IVPB 3.375 g     3.375 g 12.5 mL/hr over 240 Minutes Intravenous Every 8 hours 02/20/17 2117         Assessment/Plan Hx of CAD, NSTEMI, STEMI - S/P stent placement and has not been on plavix for a while - restart Plavix when pt  can have PO Hx of Bipolar I Hx of schizophrenia Hx of depression Hx of polysubstance abuse Hx of CHF - ECHO done 02/12, LV EF 45-50%   Perforated Gastric ulcer S/P omental patch, G tube and JP drain placement, Dr. [place], 02/10 - POD 5 - d/c PCA for pain control, transition to PO meds - JP drain serosanguinous    FEN: advance diet VTE: SCD's, lovenox ID: Zosyn 02/10>> Foley:  d/c 2/13 Follow up: Dr. Dwain S[place]    LOS: 6 days    Vanita PandaAlicia C Travontae Freiberger, MD  Colorectal and General Surgery [facility]

## 2017-02-26 NOTE — Plan of Care (Signed)
  Education: Knowledge of General Education information will improve 02/26/2017 2009 - Progressing by Luther Redourgott, Blayke Pinera, RN   Clinical Measurements: Ability to maintain clinical measurements within normal limits will improve 02/26/2017 2009 - Progressing by Luther Redourgott, Azreal Stthomas, RN   Pain Managment: General experience of comfort will improve 02/26/2017 2009 - Progressing by Luther Redourgott, Novelle Addair, RN

## 2017-02-26 NOTE — Progress Notes (Signed)
Pharmacy Antibiotic Note [name] is a 42 y.o. male admitted on 02/20/2017 with IAA/bowel perforation. Pharmacy has been consulted for Zosyn dosing.  Day #7 of Zosyn for IAA/perforated viscus. S/p omental patch, G tube and JP drain placement on 2/10. Drain output 230mL. Afebrile, WBC wnl.  Plan: Zosyn 3.375g IV q8h (4 hour infusion).  Follow-up for LOT   Temp (24hrs), Avg:97.8 F (36.6 C), Min:97.3 F (36.3 C), Max:98.2 F (36.8 C)  Recent Labs  Lab 02/20/17 2346  02/21/17 0727 02/22/17 0539 02/23/17 1010 02/24/17 0525 02/25/17 0407 02/26/17 0420  WBC  --    < >  --  8.4 7.1 6.1 4.3 3.0*  CREATININE  --    < >  --  1.04 0.97 0.96 0.97 1.02  LATICACIDVEN 3.7*  --  1.2  --   --   --   --   --    < > = values in this interval not displayed.    Estimated Creatinine Clearance: 78.7 mL/min (by C-G formula based on SCr of 1.02 mg/dL).    Allergies  Allergen Reactions  . Iodine Anaphylaxis and Swelling  . Shellfish Allergy Anaphylaxis    All shellfish  . Contrast Media [Iodinated Diagnostic Agents] Nausea And Vomiting     Thank you for allowing pharmacy to be a part of this patient's care.  Link SnufferJessica Emiliya Chretien, PharmD, BCPS, BCCCP Clinical Pharmacist Clinical phone 02/26/2017 until 3:[phone]- #[phone] After hours, please call #[phone] 02/26/2017 8:14 AM

## 2017-02-27 LAB — MAGNESIUM: MAGNESIUM: 1.5 mg/dL — AB (ref 1.7–2.4)

## 2017-02-27 LAB — CBC WITH DIFFERENTIAL/PLATELET
BASOS PCT: 0 %
Basophils Absolute: 0 10*3/uL (ref 0.0–0.1)
EOS ABS: 0.1 10*3/uL (ref 0.0–0.7)
Eosinophils Relative: 6 %
HEMATOCRIT: 32.2 % — AB (ref 39.0–52.0)
Hemoglobin: 10.9 g/dL — ABNORMAL LOW (ref 13.0–17.0)
LYMPHS ABS: 0.8 10*3/uL (ref 0.7–4.0)
LYMPHS PCT: 36 %
MCH: 29.9 pg (ref 26.0–34.0)
MCHC: 33.9 g/dL (ref 30.0–36.0)
MCV: 88.5 fL (ref 78.0–100.0)
MONOS PCT: 19 %
Monocytes Absolute: 0.4 10*3/uL (ref 0.1–1.0)
NEUTROS ABS: 1 10*3/uL — AB (ref 1.7–7.7)
Neutrophils Relative %: 39 %
Platelets: 185 10*3/uL (ref 150–400)
RBC: 3.64 MIL/uL — ABNORMAL LOW (ref 4.22–5.81)
RDW: 15 % (ref 11.5–15.5)
Smear Review: ADEQUATE
WBC: 2.3 10*3/uL — ABNORMAL LOW (ref 4.0–10.5)

## 2017-02-27 LAB — PHOSPHORUS: Phosphorus: 2.6 mg/dL (ref 2.5–4.6)

## 2017-02-27 NOTE — Progress Notes (Signed)
[facility] Progress Note  7 Days Post-Op  Subjective: CC-  Tolerating a diet. Having BM's.  Pain controlled on PO meds  Objective: Vital signs in last 24 hours: Temp:  [98 F (36.7 C)-98.4 F (36.9 C)] 98.3 F (36.8 C) (02/17 0430) Pulse Rate:  [66-84] 84 (02/17 0430) Resp:  [15] 15 (02/17 0430) BP: (120-154)/(96-98) 120/96 (02/17 0430) SpO2:  [100 %] 100 % (02/17 0430) Last BM Date: 02/27/17  Intake/Output from previous day: 02/16 0701 - 02/17 0700 In: 1012.4 [P.O.:717; I.V.:285.4] Out: 545 [Drains:545] Intake/Output this shift: Total I/O In: -  Out: 40 [Drains:40]  PE: Gen:  Alert, NAD, pleasant HEENT: EOM's intact, pupils equal and round Abd: Soft, mild distension, +BS, open, packed midline incision   JP drain with serosanguinous drainage. G tube clamped Skin: no rashes noted, warm and dry  Lab Results:  Recent Labs    02/26/17 0420 02/27/17 0414  WBC 3.0* 2.3*  HGB 11.0* 10.9*  HCT 32.8* 32.2*  PLT 159 185   BMET Recent Labs    02/25/17 0407 02/26/17 0420  NA 141 140  K 3.8 3.4*  CL 110 110  CO2 22 21*  GLUCOSE 99 92  BUN 7 6  CREATININE 0.97 1.02  CALCIUM 8.7* 8.4*   PT/INR No results for input(s): LABPROT, INR in the last 72 hours. CMP     Component Value Date/Time   NA 140 02/26/2017 0420   K 3.4 (L) 02/26/2017 0420   CL 110 02/26/2017 0420   CO2 21 (L) 02/26/2017 0420   GLUCOSE 92 02/26/2017 0420   BUN 6 02/26/2017 0420   CREATININE 1.02 02/26/2017 0420   CALCIUM 8.4 (L) 02/26/2017 0420   PROT 8.0 02/20/2017 1639   ALBUMIN 4.7 02/20/2017 1639   AST 38 02/20/2017 1639   ALT 22 02/20/2017 1639   ALKPHOS 69 02/20/2017 1639   BILITOT 1.6 (H) 02/20/2017 1639   GFRNONAA >60 02/26/2017 0420   GFRAA >60 02/26/2017 0420   Lipase     Component Value Date/Time   LIPASE 23 02/20/2017 1639       Studies/Results: Dg Kayleen MemosUgi W/water Sol Cm  Result Date: 02/25/2017 CLINICAL DATA:  Repaired perforated gastric ulcer along  the lesser curvature. EXAM: WATER SOLUBLE UPPER GI SERIES WITH KUB TECHNIQUE: Single-column upper GI series was performed using water soluble contrast. CONTRAST:  150 cc of Isovue given through the gastrostomy tube. COMPARISON:  CT scan dated 02/20/2017 FLUOROSCOPY TIME:  Fluoroscopy Time:  2 minutes 6 seconds FINDINGS: The scout image demonstrates a gastrostomy tube in place as well as a drain in the upper abdomen. There are slightly distended loops of small bowel. The colon is not distended. Stomach is not distended. Contrast was injected in the stomach and the patient was placed in the right and left lateral decubitus positions as well as supine position. There is no evidence of leakage of contrast from the stomach or duodenum. There is a small focal area which appears to be a ulceration in the pyloric channel best seen on image 1 of series 7. I am unable to identify a residual ulceration along the lesser curvature. IMPRESSION: 1. No evidence of leakage of contrast from the stomach or duodenum. 2. Focal ulceration or scarring in the pyloric channel. Electronically Signed   By: Francene BoyersJames  Maxwell M.D.   On: 02/25/2017 09:01    Anti-infectives: Anti-infectives (From admission, onward)   Start     Dose/Rate Route Frequency Ordered Stop   02/21/17 0200  piperacillin-tazobactam (ZOSYN) IVPB 3.375 g  Status:  Discontinued     3.375 g 12.5 mL/hr over 240 Minutes Intravenous Every 8 hours 02/20/17 1814 02/20/17 2115   02/20/17 1815  piperacillin-tazobactam (ZOSYN) IVPB 3.375 g     3.375 g 100 mL/hr over 30 Minutes Intravenous  Once 02/20/17 1812 02/20/17 1925   02/20/17 0300  piperacillin-tazobactam (ZOSYN) IVPB 3.375 g  Status:  Discontinued     3.375 g 12.5 mL/hr over 240 Minutes Intravenous Every 8 hours 02/20/17 2117 02/26/17 1003       Assessment/Plan Hx of CAD, NSTEMI, STEMI - S/P stent placement and has not been on plavix for a while - restart Plavix when pt can have PO Hx of Bipolar I Hx of  schizophrenia Hx of depression Hx of polysubstance abuse Hx of CHF - ECHO done 02/12, LV EF 45-50%   Perforated Gastric ulcer S/P omental patch, G tube and JP drain placement, Dr. Dwain Sarna, 02/10 - POD 7 - Cont PO meds - D/C JP drain  - D/C CVC - D/C home in AM   FEN: Cont diet VTE: SCD's, lovenox ID: Zosyn 02/10>>2/16 Foley:  d/c 2/13 Follow up: Dr. Dwain Sarna    LOS: 7 days    Vanita Panda, MD  Colorectal and General Surgery [facility]

## 2017-02-28 LAB — BASIC METABOLIC PANEL
Anion gap: 8 (ref 5–15)
BUN: 5 mg/dL — AB (ref 6–20)
CO2: 22 mmol/L (ref 22–32)
CREATININE: 1.09 mg/dL (ref 0.61–1.24)
Calcium: 8.2 mg/dL — ABNORMAL LOW (ref 8.9–10.3)
Chloride: 108 mmol/L (ref 101–111)
GFR calc Af Amer: >60 mL/min (ref >60)
Glucose, Bld: 79 mg/dL (ref 65–99)
Potassium: 4 mmol/L (ref 3.5–5.1)
SODIUM: 138 mmol/L (ref 135–145)

## 2017-02-28 LAB — CBC WITH DIFFERENTIAL/PLATELET
Basophils Absolute: 0 10*3/uL (ref 0.0–0.1)
Basophils Relative: 1 %
EOS ABS: 0.1 10*3/uL (ref 0.0–0.7)
EOS PCT: 3 %
HCT: 32.4 % — ABNORMAL LOW (ref 39.0–52.0)
HEMOGLOBIN: 11 g/dL — AB (ref 13.0–17.0)
LYMPHS ABS: 1.4 10*3/uL (ref 0.7–4.0)
Lymphocytes Relative: 36 %
MCH: 30.1 pg (ref 26.0–34.0)
MCHC: 34 g/dL (ref 30.0–36.0)
MCV: 88.5 fL (ref 78.0–100.0)
Monocytes Absolute: 0.3 10*3/uL (ref 0.1–1.0)
Monocytes Relative: 8 %
NEUTROS PCT: 52 %
Neutro Abs: 2 10*3/uL (ref 1.7–7.7)
PLATELETS: 215 10*3/uL (ref 150–400)
RBC: 3.66 MIL/uL — AB (ref 4.22–5.81)
RDW: 15 % (ref 11.5–15.5)
WBC: 3.8 10*3/uL — AB (ref 4.0–10.5)

## 2017-02-28 LAB — PATHOLOGIST SMEAR REVIEW

## 2017-02-28 LAB — PHOSPHORUS: Phosphorus: 3.1 mg/dL (ref 2.5–4.6)

## 2017-02-28 LAB — MAGNESIUM: MAGNESIUM: 1.6 mg/dL — AB (ref 1.7–2.4)

## 2017-02-28 MED ORDER — CLOPIDOGREL BISULFATE 75 MG PO TABS: 75.0000 mg | ORAL_TABLET | Freq: Every day | ORAL | 0 refills | Status: DC

## 2017-02-28 MED ORDER — ARIPIPRAZOLE 15 MG PO TABS: 15.0000 mg | ORAL_TABLET | Freq: Every day | ORAL | 0 refills | Status: AC

## 2017-02-28 MED ORDER — ROSUVASTATIN CALCIUM 40 MG PO TABS: 40.0000 mg | ORAL_TABLET | Freq: Every day | ORAL | 0 refills | Status: DC

## 2017-02-28 MED ORDER — SACCHAROMYCES BOULARDII 250 MG PO CAPS: 250.0000 mg | ORAL_CAPSULE | Freq: Two times a day (BID) | ORAL | Status: DC

## 2017-02-28 MED ORDER — LISINOPRIL 5 MG PO TABS: 5.0000 mg | ORAL_TABLET | Freq: Every day | ORAL | 0 refills | Status: DC

## 2017-02-28 MED ORDER — METOPROLOL SUCCINATE ER 25 MG PO TB24: 12.5000 mg | ORAL_TABLET | Freq: Every day | ORAL | 0 refills | Status: DC

## 2017-02-28 MED ORDER — OXYCODONE HCL 5 MG PO TABS: 5.0000 mg | ORAL_TABLET | Freq: Four times a day (QID) | ORAL | 0 refills | Status: DC | PRN

## 2017-02-28 MED ORDER — TRAZODONE HCL 100 MG PO TABS: 100.0000 mg | ORAL_TABLET | Freq: Every evening | ORAL | 0 refills | Status: AC | PRN

## 2017-02-28 MED ORDER — PANTOPRAZOLE SODIUM 40 MG IV SOLR: 40.0000 mg | Freq: Two times a day (BID) | INTRAVENOUS | 1 refills | Status: DC

## 2017-02-28 NOTE — Care Management Note (Signed)
Case Management Note  Patient Details  Name: [name] MRN: [id number] Date of Birth: January 25, 1975  Subjective/Objective:                    Action/Plan:  Confirmed face sheet information with patient. Patient has assistance at home with dressing changes. He has already been shown how to do dressing changes and is aware HHRN will not be there every time dressing needs to be changed. Expected Discharge Date:  02/28/17               Expected Discharge Plan:  Home w Home Health Services  In-House Referral:  Clinical Social Work  Discharge planning Services  CM Consult  Post Acute Care Choice:  Home Health Choice offered to:  Patient  DME Arranged:  N/A DME Agency:  NA  HH Arranged:  RN HH Agency:  [facility]  Status of Service:  Completed, signed off  If discussed at Long Length of Stay Meetings, dates discussed:    Additional Comments:  Kingsley PlanWile, Justin Yuan Marie, RN 02/28/2017, 10:04 AM

## 2017-02-28 NOTE — Discharge Summary (Signed)
[facility] Discharge Summary   Patient ID: [name] MRN: [id number] DOB/AGE: October 29, 1975 42 y.o.  Admit date: 02/20/2017 Discharge date: 02/28/2017  Admitting Diagnosis: Perforated gastric ulcer  Discharge Diagnosis Patient Active Problem List   Diagnosis Date Noted  . Peritonitis (HCC) 02/20/2017  . Perforated gastric ulcer (HCC) 02/20/2017  . Bipolar I disorder, most recent episode mixed (HCC) 10/22/2016  . Unstable angina (HCC)   . Chronic systolic CHF (congestive heart failure) (HCC) 07/08/2016  . Left leg numbness   . Tetrahydrocannabinol (THC) use disorder, severe, dependence (HCC) 03/14/2016  . Malnutrition of moderate degree 09/28/2015  . Hyperlipidemia   . Schizoaffective disorder, bipolar type (HCC) 03/06/2015  . Nausea with vomiting 03/06/2015  . Hypertensive heart disease 01/11/2015  . ST elevation myocardial infarction (STEMI) of inferior wall (HCC) 01/10/2015  . ST elevation myocardial infarction (STEMI) of inferior wall, initial episode of care (HCC) 01/10/2015  . Medical non-compliance   . ST elevation myocardial infarction involving right coronary artery (HCC) 01/15/2014  . Hx of medication noncompliance   . NSVT (nonsustained ventricular tachycardia) (HCC)   . Ischemic cardiomyopathy   . Tobacco abuse   . Anxiety   . 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 DES PCI to RCA x 2 (3.0 mm x 22 mm Resolute - distal & proximal RCA) 01/14/2014    Class: Present on Admission  . Non-STEMI (non-ST elevated myocardial infarction) (HCC) 07/22/2013  . Atypical chest pain 07/21/2013  . Bipolar 1 disorder, depressed (HCC) 07/21/2013  . HLD (hyperlipidemia) 04/16/2013  . Hyperlipidemia with target LDL less than 70 01/27/2009  . SUBSTANCE ABUSE 01/27/2009  . Depression 01/27/2009  . Essential hypertension 01/27/2009  . CAD S/P BMS PCI to RCA with PTCA for ISR --> followed by stent thrombosis - DES PCI 01/27/2009  . GASTRITIS 01/27/2009     Consultants Critical care  Imaging: No results found.  Procedures Dr. Dwain Sarna (02/20/17) - Exploratory laparotomy with omental patch of gastric ulcer and Stamm gastrostomy   Hospital Course:  [name] is a 42yo male PMH h/o substance abuse and CAD s/p multiple cardiac stents, who presented to [facility]10 with acute onset abdominal pain.  Workup showed a perforated gastric ulcer.  Patient was admitted and underwent procedure listed above.  Tolerated procedure well and was transferred to the ICU.  Critical care was consulted for assistance with management of multiple medical problems. He was successfully extubated on 2/11 and later transferred to the floor. UGI was performed on 2/15 and revealed no leakage from perforated ulcer repair site, therefore his diet was gradually advanced as tolerated. G-tube was clamped. JP drain was removed. On POD8, the patient was voiding well, tolerating diet, ambulating well, pain well controlled, vital signs stable, incisions c/d/i and felt stable for discharge home.  Patient will follow up as below and knows to call with questions or concerns.    I have personally reviewed the patients medication history on the [place] controlled substance database.    Physical Exam:  Gen:  Alert, NAD, pleasant HEENT: EOM's intact, pupils equal and round Card:  RRR, no M/G/R heard.  Pulm:  CTAB, no W/R/R, effort normal Abd: Soft, nondistended, +BS, open midline incision with beefy red tissue and no surrounding erythema or purulentdrainage/trace bloody drainage at central aspect of wound/distal aspect of wound closing, previous JP drain site cdi. G-tube clamped Ext:  Calves soft and nontender Skin: no rashes noted, warm and dry  Allergies as of 02/28/2017  Reactions   Iodine Anaphylaxis, Swelling   Shellfish Allergy Anaphylaxis   All shellfish   Contrast Media [iodinated Diagnostic Agents] Nausea And Vomiting      Medication List    TAKE these medications    ARIPiprazole 15 MG tablet Commonly known as:  ABILIFY Take 1 tablet (15 mg total) by mouth daily. What changed:  additional instructions   aspirin EC 81 MG tablet Take 81 mg daily by mouth.   clopidogrel 75 MG tablet Commonly known as:  PLAVIX Take 1 tablet (75 mg total) by mouth daily.   lisinopril 5 MG tablet Commonly known as:  PRINIVIL,ZESTRIL Take 1 tablet (5 mg total) by mouth daily. What changed:  additional instructions   metoprolol succinate 25 MG 24 hr tablet Commonly known as:  TOPROL XL Take 0.5 tablets (12.5 mg total) by mouth daily.   nicotine polacrilex 2 MG gum Commonly known as:  NICORETTE Take 1 each (2 mg total) by mouth as needed for smoking cessation. (May buy from over the counter): Smoking cessation   nitroGLYCERIN 0.4 MG SL tablet Commonly known as:  NITROSTAT Place 1 tablet (0.4 mg total) under the tongue every 5 (five) minutes x 3 doses as needed for chest pain.   ondansetron 4 MG disintegrating tablet Commonly known as:  ZOFRAN ODT Take 1 tablet (4 mg total) by mouth every 8 (eight) hours as needed.   oxyCODONE 5 MG immediate release tablet Commonly known as:  Oxy IR/ROXICODONE Take 1 tablet (5 mg total) by mouth every 6 (six) hours as needed for severe pain.   pantoprazole 40 MG injection Commonly known as:  PROTONIX Inject 40 mg into the vein every 12 (twelve) hours.   promethazine 25 MG tablet Commonly known as:  PHENERGAN Take 1 tablet (25 mg total) by mouth every 6 (six) hours as needed for nausea or vomiting.   rosuvastatin 40 MG tablet Commonly known as:  CRESTOR Take 1 tablet (40 mg total) by mouth daily.   saccharomyces boulardii 250 MG capsule Commonly known as:  FLORASTOR Take 1 capsule (250 mg total) by mouth 2 (two) times daily. You can get a probiotic over the counter   traZODone 100 MG tablet Commonly known as:  DESYREL Take 1 tablet (100 mg total) by mouth at bedtime as needed for sleep.        Follow-up  Information    Emelia LoronWakefield, Matthew, MD. Call.   Specialty:  General Surgery Why:  We are working on your appointment, please call to confirm. Please arrive 30 minutes prior to your appointment to check in and fill out paperwork. Bring photo ID and insurance information. Contact information: [address] [place] [address] [phone]        Massie MaroonHollis, Lachina M, FNP. Call in 1 week(s).   Specialty:  Family Medicine Why:  Call to arrange follow up with your primary care physician within 1 week of discharge Contact information: [address] [place] [address] [phone]           Signed: Franne FortsBrooke A Makaylin Carlo, [facility] 02/28/2017, 7:53 AM Pager: [phone] Consults: [phone] Mon-Fri 7:00 am-4:30 pm Sat-Sun 7:00 am-11:30 am

## 2017-02-28 NOTE — Progress Notes (Signed)
Dressing changed to mid abd incision. Taught pt how to do wound care. Verbalized understanding. Discharge instructions given to pt.  Discharged to home accompanied by a friend.

## 2017-02-28 NOTE — Care Management Important Message (Signed)
Important Message  Patient Details  Name: [name] MRN: [id number] Date of Birth: 1975-11-11   Medicare Important Message Given:  Yes    [name] 02/28/2017, 1:09 PM

## 2017-02-28 NOTE — Discharge Instructions (Signed)
[facility]      [facility] °[phone] ° °OPEN ABDOMINAL SURGERY: POST OP INSTRUCTIONS ° °Always review your discharge instruction sheet given to you by the facility where your surgery was performed. ° °IF YOU HAVE DISABILITY OR FAMILY LEAVE FORMS, YOU MUST BRING THEM TO THE OFFICE FOR PROCESSING.  PLEASE DO NOT GIVE THEM TO YOUR DOCTOR. ° °1. A prescription for pain medication may be given to you upon discharge.  Take your pain medication as prescribed, if needed.  If narcotic pain medicine is not needed, then you may take acetaminophen (Tylenol) or ibuprofen (Advil) as needed. °2. Take your usually prescribed medications unless otherwise directed. °3. If you need a refill on your pain medication, please contact your pharmacy. They will contact our office to request authorization.  Prescriptions will not be filled after 5pm or on week-ends. °4. You should follow a light diet the first few days after arrival home, such as soup and crackers, pudding, etc.unless your doctor has advised otherwise. A high-fiber, low fat diet can be resumed as tolerated.   Be sure to include lots of fluids daily. Most patients will experience some swelling and bruising on the chest and neck area.  Ice packs will help.  Swelling and bruising can take several days to resolve °5. Most patients will experience some swelling and bruising in the area of the incision. Ice pack will help. Swelling and bruising can take several days to resolve..  °6. It is common to experience some constipation if taking pain medication after surgery.  Increasing fluid intake and taking a stool softener will usually help or prevent this problem from occurring.  A mild laxative (Milk of Magnesia or Miralax) should be taken according to package directions if there are no bowel movements after 48 hours. °7.  You may have steri-strips (small skin tapes) in place directly over the incision.  These strips should be left on the skin for 7-10 days.  If your  surgeon used skin glue on the incision, you may shower in 24 hours.  The glue will flake off over the next 2-3 weeks.  Any sutures or staples will be removed at the office during your follow-up visit. You may find that a light gauze bandage over your incision may keep your staples from being rubbed or pulled. You may shower and replace the bandage daily. °8. ACTIVITIES:  You may resume regular (light) daily activities beginning the next day--such as daily self-care, walking, climbing stairs--gradually increasing activities as tolerated.  You may have sexual intercourse when it is comfortable.  Refrain from any heavy lifting or straining until approved by your doctor. °a. You may drive when you no longer are taking prescription pain medication, you can comfortably wear a seatbelt, and you can safely maneuver your car and apply brakes °b. Return to Work: ___________________________________ °9. You should see your doctor in the office for a follow-up appointment approximately two weeks after your surgery.  Make sure that you call for this appointment within a day or two after you arrive home to insure a convenient appointment time. °OTHER INSTRUCTIONS:  °_____________________________________________________________ °_____________________________________________________________ ° °WHEN TO CALL YOUR DOCTOR: °1. Fever over 101.0 °2. Inability to urinate °3. Nausea and/or vomiting °4. Extreme swelling or bruising °5. Continued bleeding from incision. °6. Increased pain, redness, or drainage from the incision. °7. Difficulty swallowing or breathing °8. Muscle cramping or spasms. °9. Numbness or tingling in hands or feet or around lips. ° °The clinic staff is available to   answer your questions during regular business hours.  Please don’t hesitate to call and ask to speak to one of the nurses if you have concerns. ° °For further questions, please visit [email or web address] ° °MIDLINE WOUND CARE: °- midline dressing  to be changed twice daily °- supplies: sterile saline, kerlix, scissors, ABD pads, tape  °- remove dressing and all packing carefully, moistening with sterile saline as needed to avoid packing/internal dressing sticking to the wound. °- clean edges of skin around the wound with water/gauze, making sure there is no tape debris or leakage left on skin that could cause skin irritation or breakdown. °- dampen and clean kerlix with sterile saline and pack wound from wound base to skin level, making sure to take note of any possible areas of wound tracking, tunneling and packing appropriately. Wound can be packed loosely. Trim kerlix to size if a whole kerlix is not required. °- cover wound with a dry ABD pad and secure with tape.  °- write the date/time on the dry dressing/tape to better track when the last dressing change occurred. °- apply any skin protectant/powder recommended by clinician to protect skin/skin folds. °- change dressing as needed if leakage occurs, wound gets contaminated, or patient requests to shower. °- patient may shower daily with wound open and following the shower the wound should be dried and a clean dressing placed.  ° °

## 2017-03-11 ENCOUNTER — Ambulatory Visit (INDEPENDENT_AMBULATORY_CARE_PROVIDER_SITE_OTHER): Payer: Medicare Other | Admitting: Cardiovascular Disease

## 2017-03-11 ENCOUNTER — Encounter: Payer: Self-pay | Admitting: Cardiovascular Disease

## 2017-03-11 VITALS — BP 118/62 | HR 70 | Ht 71.0 in | Wt 125.0 lb

## 2017-03-11 DIAGNOSIS — I10 Essential (primary) hypertension: Secondary | ICD-10-CM | POA: Diagnosis not present

## 2017-03-11 DIAGNOSIS — I255 Ischemic cardiomyopathy: Secondary | ICD-10-CM | POA: Diagnosis not present

## 2017-03-11 DIAGNOSIS — E785 Hyperlipidemia, unspecified: Secondary | ICD-10-CM | POA: Diagnosis not present

## 2017-03-11 DIAGNOSIS — I2510 Atherosclerotic heart disease of native coronary artery without angina pectoris: Secondary | ICD-10-CM

## 2017-03-11 DIAGNOSIS — I5022 Chronic systolic (congestive) heart failure: Secondary | ICD-10-CM | POA: Diagnosis not present

## 2017-03-11 DIAGNOSIS — Z9861 Coronary angioplasty status: Secondary | ICD-10-CM

## 2017-03-11 DIAGNOSIS — K251 Acute gastric ulcer with perforation: Secondary | ICD-10-CM

## 2017-03-11 MED ORDER — ROSUVASTATIN CALCIUM 40 MG PO TABS: 40.0000 mg | ORAL_TABLET | Freq: Every day | ORAL | 3 refills | Status: DC

## 2017-03-11 MED ORDER — PANTOPRAZOLE SODIUM 40 MG PO TBEC: 40.0000 mg | DELAYED_RELEASE_TABLET | Freq: Every day | ORAL | 3 refills | Status: DC

## 2017-03-11 MED ORDER — CLOPIDOGREL BISULFATE 75 MG PO TABS: 75.0000 mg | ORAL_TABLET | Freq: Every day | ORAL | 3 refills | Status: DC

## 2017-03-11 MED ORDER — METOPROLOL SUCCINATE ER 25 MG PO TB24: 12.5000 mg | ORAL_TABLET | Freq: Every day | ORAL | 3 refills | Status: DC

## 2017-03-11 MED ORDER — LISINOPRIL 5 MG PO TABS: 5.0000 mg | ORAL_TABLET | Freq: Every day | ORAL | 3 refills | Status: DC

## 2017-03-11 NOTE — Progress Notes (Signed)
Patient ID: [name], male   DOB: 1975/09/06, 42 y.o.   MRN: [id number]     HPI: [name] is a 42 y.o. male who presents to the office today for a  follow up cardiology evaluation.    [name] is a 42 year old, African American male with a history of CAD, who suffered an inferior wall ST segment elevation myocardial infarction and underwent bare-metal stenting to his RCA in 2009.  He has history of hypertension, hyperlipidemia, bipolar disorder, tobacco use, and marijuana use.  In the medical records.  There is indication of possible crack cocaine use, but the patient vehemently denies this.  He was admitted to [facility] on 07/21/2013 with squeezing chest pressure.  He ruled in for a non-STEMI with a peak troponin of 16.  He had transient nonsustained VT on telemetry, related to his infarct.  He was premedicated for possible dye allergy, and underwent cardiac catheterization by Dr. Swaziland, which revealed occlusion of the first diagonal vessel, and focal restenosis within the previously placed distal RCA stent.  This was treated with 3.5 x 10 mm cutting balloon intervention to the in-stent restenosis with plans for medical therapy for the diagonal occlusion.  He had mild 20% stenoses in the LAD, and in addition to occlusion of the first diagonal vessel, the second diagonal vessel, 70% stenosis at the ostium.  The circumflex vessel had 50% stenosis in the midportion.  The proximal RCA had disease of 30% and had 80-90% focal in-stent restenosis.  Subsequently, he has undergone catheterization in 2016 by Dr. Herbie Baltimore, in May 2017 by Dr. Swaziland and September 2017 by Dr. Eldridge Dace and most recently in July 2018 by Dr. Swaziland.  At his last catheterization he was found to have single-vessel obstruction involving a small diagonal branch, with mild stenoses in the LAD and circumflex with continued patency of stents in the proximal and distal RCA.  He had mild-to-moderate LV dysfunction with an EF  of 45% and mild inferior akinesis.  He was hospitalized on the psychiatry service with suicidal ideaion when he was off his bipolar medication.  Since being discharged, he believes he is stable.  I had not seen him since 2015 until I saw him in November 2018 for evaluation.  At that time .  He denied any chest pain but was using marijuana daily and drinking daily beer.  He denied ever using cocaine.  He smokes cigarettes.  He denied exertional chest tightness.  Marland Kitchen  He denied palpitations, PND, orthopnea.  When I saw him, his blood pressure was stable on lisinopril.  I recommended initiation of low-dose Toprol-XL 12.5 mg in light of his underlying CAD.  I recommended initiation of Crestor 40 mg since his LDL was 202, and total cholesterol 262.  We discussed smoking cessation.  Since I saw him, he was hospitalized on 02/20/2017 with severe nausea, vomiting, and was found to have a perforated gastric ulcer.  He underwent exploratory laparotomy by Dr. Dwain Sarna and had an omental patch placed to his gastric ulcer and Stamm gastrostomy.  He was hospitalized for 8 days.  He saw a surgeon today for follow-up evaluation and will continue with his gastrostomy tube with possible discontinuance at follow-up visit in mid March.  He denies chest pain, PND, orthopnea.  He tolerated his exploratory laparotomy without cardiovascular compromise.  He is trying to quit tobacco and has a quit date set.  He is also significantly trying to reduce his marijuana intake.  He presents for  evaluation.  Past Medical History:  Diagnosis Date  . Anxiety   . Bipolar 1 disorder (HCC)   . CAD S/P percutaneous coronary angioplasty    a. Inf STEMI s/p BMS to RCA 01/2007. b. NSTEMI 07/2013 s/p PTCA to RCA for ISR; c. Transient inferior ST elevation (peak Ti 0.25) 01/2014 s/p PTCA/DES to pRCA, PTCA/DES to dRCA, EF 50%; d. 12/2014 Inf STEMI: RCA patent prox stent, [id number]/d (3.0x32 Synergy DES), EF 35-45; e. 05/2015 Inf STEMI/Cath: LM nl, LAD  10ost/m, D1 75, LCX nl, OM1 25, OM2 30, RCA patent stents, RPDA 30ost.  . Hx of medication noncompliance   . Hyperlipidemia   . Hypertensive heart disease   . Ischemic cardiomyopathy    a. EF 40% in 2011, 60% in 2012. b. EF 55% by cath 07/2013. c. EF 50% by cath 01/2014; d. 12/2014 EF 35-45% by LV gram; e. 05/2015 Echo: EF 50-55% inflat/inf AK, mild AI; f. 06/2015 cMRI: EF 49%, basal & mid inf full thickness scar, subendocardial scar in antsept and antlat wall, correlating w/ D1 dzs.  . Myocardial infarction [facility](HCC)    pt report   . NSVT (nonsustained ventricular tachycardia) (HCC)    a. Very brief run during 07/2013 admit for NSTEMI felt due to MI.  . Polysubstance abuse (HCC)    a. h/o tobacco, marijuana and crack cocaine use. b. 07/2013: +UDS THC, neg for cocaine.  . Schizophrenia (HCC)    pt report   . Tobacco abuse     Past Surgical History:  Procedure Laterality Date  . CARDIAC CATHETERIZATION  01/16/2009   normal left main, Cfx with 2-OMs both w/minor irregularities, LAd with 20-30% mid region irregularities, ramus intermediate/optional diagonal with 60% osital narrowing, RCA with stent in distal portion w/20% prox in-stent stenosis (Dr. Mervyn SkeetersA. Little)  . CARDIAC CATHETERIZATION  07/23/2013   two vessel obstructive CAD, occluded first diagonal, focal in-stent restenosis in distal RCA (Dr. Peter SwazilandJordan)  . CARDIAC CATHETERIZATION N/A 01/10/2015   Procedure: Left Heart Cath and Coronary Angiography;  Surgeon: Marykay Lexavid W Harding, MD;  [facility];  Service: Cardiovascular;  Laterality: N/A;  . CARDIAC CATHETERIZATION N/A 01/10/2015   Procedure: Coronary Stent Intervention;  Surgeon: Marykay Lexavid W Harding, MD;  [facility];  Service: Cardiovascular;  Laterality: N/A;  . CARDIAC CATHETERIZATION N/A 06/10/2015   Procedure: Left Heart Cath and Coronary Angiography;  Surgeon: Peter M SwazilandJordan, MD;  [facility];  Service: Cardiovascular;  Laterality: N/A;  . CARDIAC  CATHETERIZATION N/A 09/27/2015   Procedure: Left Heart Cath and Coronary Angiography;  Surgeon: Corky CraftsJayadeep S Varanasi, MD;  [facility];  Service: Cardiovascular;  Laterality: N/A;  . CARDIAC CATHETERIZATION N/A 09/27/2015   Procedure: Coronary Balloon Angioplasty;  Surgeon: Corky CraftsJayadeep S Varanasi, MD;  Location: [facility] INVASIVE CV LAB;  Service: Cardiovascular;  Laterality: N/A;  . CORONARY ANGIOPLASTY WITH STENT PLACEMENT  02/05/2007   3.5x2518mm Quantum non-DES to RCA (Dr. Nicki Guadalajarahomas Denni France)  . FEMORAL ARTERY STENT    . LAPAROTOMY N/A 02/20/2017   Procedure: EXPLORATORY LAPAROTOMY AND REPAIR, PATCH, OR REMOVE PERFORATION IN BOWEL;  Surgeon: Emelia LoronWakefield, Matthew, MD;  Location: [facility] OR;  Service: General;  Laterality: N/A;  . LEFT AND RIGHT HEART CATHETERIZATION WITH CORONARY ANGIOGRAM N/A 01/14/2014   Procedure: LEFT AND RIGHT HEART CATHETERIZATION WITH CORONARY ANGIOGRAM;  Surgeon: Kathleene Hazelhristopher D McAlhany, MD;  [facility];  Service: Cardiovascular;  Laterality: N/A;  . LEFT HEART CATH AND CORONARY ANGIOGRAPHY N/A  07/12/2016   Procedure: Left Heart Cath and Coronary Angiography;  Surgeon: Swaziland, Peter M, MD;  [facility] INV[facility] CV LAB;  Service: Cardiovascular;  Laterality: N/A;  . LEFT HEART CATHETERIZATION WITH CORONARY ANGIOGRAM N/A 07/23/2013   Procedure: LEFT HEART CATHETERIZATION WITH CORONARY ANGIOGRAM;  Surgeon: Peter M Swaziland, MD;  Location: [facility] CATH LAB;  Service: Cardiovascular;  Laterality: N/A;    Allergies  Allergen Reactions  . Iodine Anaphylaxis and Swelling  . Shellfish Allergy Anaphylaxis    All shellfish  . Contrast Media [Iodinated Diagnostic Agents] Nausea And Vomiting    Current Outpatient Medications  Medication Sig Dispense Refill  . ARIPiprazole (ABILIFY) 15 MG tablet Take 1 tablet (15 mg total) by mouth daily. 14 tablet 0  . aspirin EC 81 MG tablet Take 81 mg daily by mouth.    . clopidogrel (PLAVIX) 75 MG tablet Take 1 tablet (75 mg total) by mouth daily.  90 tablet 3  . lisinopril (PRINIVIL,ZESTRIL) 5 MG tablet Take 1 tablet (5 mg total) by mouth daily. 90 tablet 3  . metoprolol succinate (TOPROL XL) 25 MG 24 hr tablet Take 0.5 tablets (12.5 mg total) by mouth daily. 45 tablet 3  . nicotine polacrilex (NICORETTE) 2 MG gum Take 1 each (2 mg total) by mouth as needed for smoking cessation. (May buy from over the counter): Smoking cessation 100 tablet 0  . nitroGLYCERIN (NITROSTAT) 0.4 MG SL tablet Place 1 tablet (0.4 mg total) under the tongue every 5 (five) minutes x 3 doses as needed for chest pain. 1 tablet 0  . ondansetron (ZOFRAN ODT) 4 MG disintegrating tablet Take 1 tablet (4 mg total) by mouth every 8 (eight) hours as needed. 12 tablet 0  . oxyCODONE (OXY IR/ROXICODONE) 5 MG immediate release tablet Take 1 tablet (5 mg total) by mouth every 6 (six) hours as needed for severe pain. 25 tablet 0  . promethazine (PHENERGAN) 25 MG tablet Take 1 tablet (25 mg total) by mouth every 6 (six) hours as needed for nausea or vomiting. 12 tablet 0  . rosuvastatin (CRESTOR) 40 MG tablet Take 1 tablet (40 mg total) by mouth daily. 90 tablet 3  . saccharomyces boulardii (FLORASTOR) 250 MG capsule Take 1 capsule (250 mg total) by mouth 2 (two) times daily. You can get a probiotic over the counter    . traZODone (DESYREL) 100 MG tablet Take 1 tablet (100 mg total) by mouth at bedtime as needed for sleep. 14 tablet 0  . pantoprazole (PROTONIX) 40 MG tablet Take 1 tablet (40 mg total) by mouth daily. 90 tablet 3   No current facility-administered medications for this visit.     Social History   Socioeconomic History  . Marital status: Single    Spouse name: Not on file  . Number of children: Not on file  . Years of education: Not on file  . Highest education level: Not on file  Social Needs  . Financial resource strain: Not on file  . Food insecurity - worry: Not on file  . Food insecurity - inability: Not on file  . Transportation needs - medical: Not  on file  . Transportation needs - non-medical: Not on file  Occupational History  . Not on file  Tobacco Use  . Smoking status: Current Every Day Smoker    Packs/day: 1.00    Years: 30.00    Pack years: 30.00    Types: Cigarettes  . Smokeless tobacco: Never Used  Substance and Sexual Activity  .  Alcohol use: Yes    Alcohol/week: 0.0 oz    Comment: 1 40 oz 2 times a week "cause my gf does"  . Drug use: Yes    Types: Marijuana    Comment: current user- daily one blunt   . Sexual activity: Not Currently  Other Topics Concern  . Not on file  Social History Narrative  . Not on file    Family History  Problem Relation Age of Onset  . CAD Mother   . Heart disease Mother   . Heart attack Mother   . Schizophrenia Mother   . CAD Sister     ROS General: Negative; No fevers, chills, or night sweats HEENT: Negative; No changes in vision or hearing, sinus congestion, difficulty swallowing Pulmonary: Negative; No cough, wheezing, shortness of breath, hemoptysis Cardiovascular: See HPI:  GI: Negative; No nausea, vomiting, diarrhea, or abdominal pain GU: Negative; No dysuria, hematuria, or difficulty voiding Musculoskeletal: Negative; no myalgias, joint pain, or weakness Hematologic: Negative; no easy bruising, bleeding Endocrine: Negative; no heat/cold intolerance; no diabetes, Neuro: Negative; no changes in balance, headaches Skin: Negative; No rashes or skin lesions Psychiatric: History of bipolar disorder.  History of substance abuse.  Recent hospitalization when off bipolar medications for suicidal ideation  Sleep: Negative; No snoring,  daytime sleepiness, hypersomnolence, bruxism, restless legs, hypnogognic hallucinations. Other comprehensive 14 point system review is negative   Physical Exam BP 118/62   Pulse 70   Ht 5\' 11"  (1.803 m)   Wt 125 lb (56.7 kg)   BMI 17.43 kg/m    Repeat blood pressure by me was 112/64 supine and 102/62 standing.  He was  asymptomatic.  Wt Readings from Last 3 Encounters:  03/11/17 125 lb (56.7 kg)  02/21/17 126 lb 1.6 oz (57.2 kg)  02/19/17 125 lb (56.7 kg)   General: Alert, oriented, no distress.  Skin: normal turgor, no rashes, warm and dry HEENT: Normocephalic, atraumatic. Pupils equal round and reactive to light; sclera anicteric; extraocular muscles intact;  Nose without nasal septal hypertrophy Mouth/Parynx benign; Mallinpatti scale 2 Neck: No JVD, no carotid bruits; normal carotid upstroke Lungs: clear to ausculatation and percussion; no wheezing or rales Chest wall: without tenderness to palpitation Heart: PMI not displaced, RRR, s1 s2 normal, 1/6 systolic murmur, no diastolic murmur, no rubs, gallops, thrills, or heaves Abdomen: Bandaged with gastrostomy tube in place; no hepatosplenomehaly, BS+; abdominal aorta nontender and not dilated by palpation. Back: no CVA tenderness Pulses 2+ Musculoskeletal: full range of motion, normal strength, no joint deformities Extremities: no clubbing cyanosis or edema, Homan's sign negative  Neurologic: grossly nonfocal; Cranial nerves grossly wnl Psychologic: Normal mood and affect   ECG (independently read by me): Normal sinus rhythm at 70 bpm.  Incomplete right bundle branch block, inferior infarction with Q waves in persistent T-wave inversion with preserved R waves, early repolarization  November 2018 ECG (independently read by me):Normal sinus rhythm at 67 bpm.  Inferior Q waves concordant with old inferior MI, but preserved R waves.  Incomplete right bundle branch block.  Normal intervals.  No ectopy.  August 2015 ECG (independently read by me): Normal sinus rhythm  LABS:  BMET    Component Value Date/Time   NA 138 02/28/2017 0302   K 4.0 02/28/2017 0302   CL 108 02/28/2017 0302   CO2 22 02/28/2017 0302   GLUCOSE 79 02/28/2017 0302   BUN 5 (L) 02/28/2017 0302   CREATININE 1.09 02/28/2017 0302   CALCIUM 8.2 (L) 02/28/2017 0302  GFRNONAA  >60 02/28/2017 0302   GFRAA >60 02/28/2017 0302     Hepatic Function Panel     Component Value Date/Time   PROT 8.0 02/20/2017 1639   ALBUMIN 4.7 02/20/2017 1639   AST 38 02/20/2017 1639   ALT 22 02/20/2017 1639   ALKPHOS 69 02/20/2017 1639   BILITOT 1.6 (H) 02/20/2017 1639   BILIDIR <0.1 (L) 11/19/2015 1041   IBILI NOT CALCULATED 11/19/2015 1041     CBC    Component Value Date/Time   WBC 3.8 (L) 02/28/2017 0302   RBC 3.66 (L) 02/28/2017 0302   HGB 11.0 (L) 02/28/2017 0302   HCT 32.4 (L) 02/28/2017 0302   PLT 215 02/28/2017 0302   MCV 88.5 02/28/2017 0302   MCH 30.1 02/28/2017 0302   MCHC 34.0 02/28/2017 0302   RDW 15.0 02/28/2017 0302   LYMPHSABS 1.4 02/28/2017 0302   MONOABS 0.3 02/28/2017 0302   EOSABS 0.1 02/28/2017 0302   BASOSABS 0.0 02/28/2017 0302     BNP    Component Value Date/Time   PROBNP 51.6 07/21/2013 0530    Lipid Panel     Component Value Date/Time   CHOL 262 (H) 07/09/2016 0036   TRIG 52 02/20/2017 2116   HDL 47 07/09/2016 0036   CHOLHDL 5.6 07/09/2016 0036   VLDL 13 07/09/2016 0036   LDLCALC 202 (H) 07/09/2016 0036     RADIOLOGY: No results found.  IMPRESSION: 1. CAD S/P BMS PCI to RCA with PTCA for ISR --> followed by stent thrombosis - DES PCI   2. Ischemic cardiomyopathy   3. Chronic systolic CHF (congestive heart failure) (HCC)   4. Essential hypertension   5. Hyperlipidemia with target LDL less than 70   6. Acute gastric ulcer with perforation [facility])     ASSESSMENT AND PLAN: Mr. Simmers is a 42 year old African-American gentleman who suffered an initial MI in January 2009 treated with bare-metal stenting to his RCA.  Ejection fraction at that time was  40%.  He suffered a NSTEMI in 2015 and was found to have high-grade in-stent restenosis to his RCA, as well as an occluded diagonal vessel.  Ejection fraction was 55% by catheterization.   He has undergone at least 4 additional cardiac catheterizations with his most recent  being in July 2018.  When I saw him in November 2018  smoking cigarettes , drinking beer and using marijuana.  He denied ever using cocaine. Due to  his recurrent need for catheterizations and restenosis he was started back on antiplatelet therapy and also was started on high-dose rosuvastatin and low-dose lisinopril.   He developed gastric ulcer and required surgery on 02/20/2017.  Apparently he is back on aspirin, Plavix and states that he stopped beer use, has a quit date for tobacco.  If the surgeons feel antiplatelet therapy is at risk Will be discontinued and he will be maintained on baby aspirin alone or Plavix alone.  He has a follow-up surgical date in several weeks where his gastrostomy tube will be removed.  He had been on IV Protonix during his hospitalization.  I have written a prescription for Protonix 40 mg orally daily.  Her is stable without chest pain or significant orthostatic drop.  He is tolerating rosuvastatin with a target LDL less than 70.  He has not had follow-up laboratory.  Fasting labs will be recommended.  I will see him in several months for reevaluation.  Time spent: 25 minutes  Lennette Bihari, MD, [facility]  03/12/2017 11:27  AM    

## 2017-03-11 NOTE — Patient Instructions (Signed)
Medication Instructions:  Your physician recommends that you continue on your current medications as directed. Please refer to the Current Medication list given to you today.  Follow-Up: Your physician wants you to follow-up in: 6 months with Dr. Kelly.  You will receive a reminder letter in the mail two months in advance. If you don't receive a letter, please call our office to schedule the follow-up appointment.      If you need a refill on your cardiac medications before your next appointment, please call your pharmacy.   

## 2017-03-12 ENCOUNTER — Encounter: Payer: Self-pay | Admitting: Cardiovascular Disease

## 2017-11-02 ENCOUNTER — Emergency Department (HOSPITAL_COMMUNITY): Payer: Medicare Other

## 2017-11-02 ENCOUNTER — Encounter (HOSPITAL_COMMUNITY): Payer: Self-pay | Admitting: Emergency Medicine

## 2017-11-02 ENCOUNTER — Other Ambulatory Visit: Payer: Self-pay

## 2017-11-02 ENCOUNTER — Observation Stay (HOSPITAL_COMMUNITY)
Admission: EM | Admit: 2017-11-02 | Discharge: 2017-11-03 | Disposition: A | Discharge status: Home / Self Care | Payer: Medicare Other | Attending: Internal Medicine | Admitting: Internal Medicine

## 2017-11-02 DIAGNOSIS — Z7982 Long term (current) use of aspirin: Secondary | ICD-10-CM | POA: Insufficient documentation

## 2017-11-02 DIAGNOSIS — Z7902 Long term (current) use of antithrombotics/antiplatelets: Secondary | ICD-10-CM | POA: Insufficient documentation

## 2017-11-02 DIAGNOSIS — F17210 Nicotine dependence, cigarettes, uncomplicated: Secondary | ICD-10-CM | POA: Diagnosis not present

## 2017-11-02 DIAGNOSIS — Z720 Tobacco use: Secondary | ICD-10-CM | POA: Diagnosis present

## 2017-11-02 DIAGNOSIS — Z79899 Other long term (current) drug therapy: Secondary | ICD-10-CM | POA: Insufficient documentation

## 2017-11-02 DIAGNOSIS — I110 Hypertensive heart disease with heart failure: Secondary | ICD-10-CM | POA: Insufficient documentation

## 2017-11-02 DIAGNOSIS — I255 Ischemic cardiomyopathy: Secondary | ICD-10-CM | POA: Diagnosis not present

## 2017-11-02 DIAGNOSIS — R0789 Other chest pain: Secondary | ICD-10-CM | POA: Diagnosis present

## 2017-11-02 DIAGNOSIS — Z955 Presence of coronary angioplasty implant and graft: Secondary | ICD-10-CM | POA: Insufficient documentation

## 2017-11-02 DIAGNOSIS — K219 Gastro-esophageal reflux disease without esophagitis: Secondary | ICD-10-CM | POA: Diagnosis not present

## 2017-11-02 DIAGNOSIS — I471 Supraventricular tachycardia: Secondary | ICD-10-CM | POA: Insufficient documentation

## 2017-11-02 DIAGNOSIS — F209 Schizophrenia, unspecified: Secondary | ICD-10-CM | POA: Insufficient documentation

## 2017-11-02 DIAGNOSIS — R05 Cough: Secondary | ICD-10-CM | POA: Diagnosis not present

## 2017-11-02 DIAGNOSIS — E785 Hyperlipidemia, unspecified: Secondary | ICD-10-CM | POA: Insufficient documentation

## 2017-11-02 DIAGNOSIS — F419 Anxiety disorder, unspecified: Secondary | ICD-10-CM | POA: Insufficient documentation

## 2017-11-02 DIAGNOSIS — Z91013 Allergy to seafood: Secondary | ICD-10-CM | POA: Insufficient documentation

## 2017-11-02 DIAGNOSIS — Z888 Allergy status to other drugs, medicaments and biological substances status: Secondary | ICD-10-CM | POA: Diagnosis not present

## 2017-11-02 DIAGNOSIS — I2510 Atherosclerotic heart disease of native coronary artery without angina pectoris: Secondary | ICD-10-CM | POA: Diagnosis not present

## 2017-11-02 DIAGNOSIS — I10 Essential (primary) hypertension: Secondary | ICD-10-CM | POA: Diagnosis present

## 2017-11-02 DIAGNOSIS — I5022 Chronic systolic (congestive) heart failure: Secondary | ICD-10-CM | POA: Diagnosis not present

## 2017-11-02 DIAGNOSIS — F319 Bipolar disorder, unspecified: Secondary | ICD-10-CM | POA: Diagnosis not present

## 2017-11-02 DIAGNOSIS — Z8249 Family history of ischemic heart disease and other diseases of the circulatory system: Secondary | ICD-10-CM | POA: Diagnosis not present

## 2017-11-02 DIAGNOSIS — I252 Old myocardial infarction: Secondary | ICD-10-CM | POA: Diagnosis not present

## 2017-11-02 DIAGNOSIS — Z9861 Coronary angioplasty status: Secondary | ICD-10-CM

## 2017-11-02 LAB — CBC
HCT: 36 % — ABNORMAL LOW (ref 39.0–52.0)
Hemoglobin: 11.6 g/dL — ABNORMAL LOW (ref 13.0–17.0)
MCH: 29.1 pg (ref 26.0–34.0)
MCHC: 32.2 g/dL (ref 30.0–36.0)
MCV: 90.2 fL (ref 80.0–100.0)
NRBC: 0 % (ref 0.0–0.2)
PLATELETS: 264 10*3/uL (ref 150–400)
RBC: 3.99 MIL/uL — AB (ref 4.22–5.81)
RDW: 17.9 % — ABNORMAL HIGH (ref 11.5–15.5)
WBC: 6.3 10*3/uL (ref 4.0–10.5)

## 2017-11-02 LAB — BASIC METABOLIC PANEL
ANION GAP: 6 (ref 5–15)
BUN: 7 mg/dL (ref 6–20)
CALCIUM: 9 mg/dL (ref 8.9–10.3)
CO2: 23 mmol/L (ref 22–32)
Chloride: 106 mmol/L (ref 98–111)
Creatinine, Ser: 1.25 mg/dL — ABNORMAL HIGH (ref 0.61–1.24)
GFR calc non Af Amer: >60 mL/min (ref >60)
Glucose, Bld: 124 mg/dL — ABNORMAL HIGH (ref 70–99)
Potassium: 3.8 mmol/L (ref 3.5–5.1)
Sodium: 135 mmol/L (ref 135–145)

## 2017-11-02 LAB — I-STAT TROPONIN, ED: Troponin i, poc: 0 ng/mL (ref 0.00–0.08)

## 2017-11-02 NOTE — ED Triage Notes (Signed)
Pt BIB [facility], c/o "shaking" x 1 hour, en route to ED pt developed 3/10 chest pain, given 1 NTG with improvement to 1/10 pain. Given 324mg  aspirin PTA. Hx MI with stents

## 2017-11-02 NOTE — ED Provider Notes (Signed)
TIME SEEN: 11:30 PM  CHIEF COMPLAINT: Chest pain  HPI: Patient is a 42 year old male with history of CAD status post stent in the RCA, previous PTCA and aspiration thrombectomy who presents to the emergency department with complaints of chest pain.  Symptoms started an hour prior to arrival where he had left-sided chest pressure without radiation.  States he felt very shaky but was not cold.  These are the same symptoms he had when he has had previous heart attack.  Denies shortness of breath, nausea, vomiting, diaphoresis but did feel dizzy.  Was given aspirin and 1 nitroglycerin in route and is now asymptomatic.  He is concerned that he has had another heart attack.  His cardiologist is Dr. Tresa Endo.  Cath 07/12/16:   Prox RCA to Mid RCA lesion, 0 %stenosed.  Mid RCA to Dist RCA lesion, 0 %stenosed.  Ost RPDA to RPDA lesion, 30 %stenosed.  Ost 2nd Mrg to 2nd Mrg lesion, 30 %stenosed.  Ost 1st Mrg to 1st Mrg lesion, 25 %stenosed.  Ost LAD to Mid LAD lesion, 10 %stenosed.  Ost 1st Diag to 1st Diag lesion, 80 %stenosed.  Mid LAD lesion, 40 %stenosed.  There is mild to moderate left ventricular systolic dysfunction.  LV end diastolic pressure is normal.   1. Single vessel obstructive CAD involving a small diagonal branch. 2. Continue patency of stents in the RCA 3. Mild to moderate LV dysfunction with EF 45%. Mid inferior akinesis. 4. Normal LVEDP  Plan: medical therapy. No new lesions to explain his current chest pain.  Cath 09/27/15:   Prox RCA stent is patent.  Ost 1st Diag to 1st Diag lesion, 75 %stenosed.  Nononstructive disease in the LAD and circumflex.  Mid RCA to Dist RCA lesion, 100 %stenosed. Post intervention with aspiration thrombectomy and PTCA with a 3.5 [place] balloon, there is a 0% residual stenosis.  LV end diastolic pressure is normal.  There is no aortic valve stenosis.  He had right radial artery spasm treated with IA NTG.   Restart DAPT.  Will have to  find a way for him to have Brilinta or Plavix long term.  Would like to keep him on Plavix long term given this episode.  IVUS not performed due to small distal vessel, beyond the stented segment.   ROS: See HPI Constitutional: no fever  Eyes: no drainage  ENT: no runny nose   Cardiovascular:  chest pain  Resp: no SOB  GI: no vomiting GU: no dysuria Integumentary: no rash  Allergy: no hives  Musculoskeletal: no leg swelling  Neurological: no slurred speech ROS otherwise negative  PAST MEDICAL HISTORY/PAST SURGICAL HISTORY:  Past Medical History:  Diagnosis Date  . Anxiety   . Bipolar 1 disorder (HCC)   . CAD S/P percutaneous coronary angioplasty    a. Inf STEMI s/p BMS to RCA 01/2007. b. NSTEMI 07/2013 s/p PTCA to RCA for ISR; c. Transient inferior ST elevation (peak Ti 0.25) 01/2014 s/p PTCA/DES to pRCA, PTCA/DES to dRCA, EF 50%; d. 12/2014 Inf STEMI: RCA patent prox stent, 158m/d (3.0x32 Synergy DES), EF 35-45; e. 05/2015 Inf STEMI/Cath: LM nl, LAD 10ost/m, D1 75, LCX nl, OM1 25, OM2 30, RCA patent stents, RPDA 30ost.  . Hx of medication noncompliance   . Hyperlipidemia   . Hypertensive heart disease   . Ischemic cardiomyopathy    a. EF 40% in 2011, 60% in 2012. b. EF 55% by cath 07/2013. c. EF 50% by cath 01/2014; d. 12/2014 EF 35-45% by LV  gram; e. 05/2015 Echo: EF 50-55% inflat/inf AK, mild AI; f. 06/2015 cMRI: EF 49%, basal & mid inf full thickness scar, subendocardial scar in antsept and antlat wall, correlating w/ D1 dzs.  . Myocardial infarction [facility])    pt report   . NSVT (nonsustained ventricular tachycardia) (HCC)    a. Very brief run during 07/2013 admit for NSTEMI felt due to MI.  . Polysubstance abuse (HCC)    a. h/o tobacco, marijuana and crack cocaine use. b. 07/2013: +UDS THC, neg for cocaine.  . Schizophrenia (HCC)    pt report   . Tobacco abuse     MEDICATIONS:  Prior to Admission medications   Medication Sig Start Date End Date Taking? Authorizing Provider   ARIPiprazole (ABILIFY) 15 MG tablet Take 1 tablet (15 mg total) by mouth daily. 02/28/17   Meuth, Lina Sar, PA-C  aspirin EC 81 MG tablet Take 81 mg daily by mouth.    [provider]  clopidogrel (PLAVIX) 75 MG tablet Take 1 tablet (75 mg total) by mouth daily. 03/11/17   Lennette Bihari, MD  lisinopril (PRINIVIL,ZESTRIL) 5 MG tablet Take 1 tablet (5 mg total) by mouth daily. 03/11/17   Lennette Bihari, MD  metoprolol succinate (TOPROL XL) 25 MG 24 hr tablet Take 0.5 tablets (12.5 mg total) by mouth daily. 03/11/17   Lennette Bihari, MD  nicotine polacrilex (NICORETTE) 2 MG gum Take 1 each (2 mg total) by mouth as needed for smoking cessation. (May buy from over the counter): Smoking cessation 10/25/16   Armandina Stammer I, NP  nitroGLYCERIN (NITROSTAT) 0.4 MG SL tablet Place 1 tablet (0.4 mg total) under the tongue every 5 (five) minutes x 3 doses as needed for chest pain. 10/25/16   Armandina Stammer I, NP  ondansetron (ZOFRAN ODT) 4 MG disintegrating tablet Take 1 tablet (4 mg total) by mouth every 8 (eight) hours as needed. 02/19/17   Mabe, Latanya Maudlin, MD  oxyCODONE (OXY IR/ROXICODONE) 5 MG immediate release tablet Take 1 tablet (5 mg total) by mouth every 6 (six) hours as needed for severe pain. 02/28/17   Meuth, Brooke A, PA-C  pantoprazole (PROTONIX) 40 MG tablet Take 1 tablet (40 mg total) by mouth daily. 03/11/17   Lennette Bihari, MD  promethazine (PHENERGAN) 25 MG tablet Take 1 tablet (25 mg total) by mouth every 6 (six) hours as needed for nausea or vomiting. 02/19/17   Mabe, Latanya Maudlin, MD  rosuvastatin (CRESTOR) 40 MG tablet Take 1 tablet (40 mg total) by mouth daily. 03/11/17   Lennette Bihari, MD  saccharomyces boulardii (FLORASTOR) 250 MG capsule Take 1 capsule (250 mg total) by mouth 2 (two) times daily. You can get a probiotic over the counter 02/28/17   Meuth, Brooke A, PA-C  traZODone (DESYREL) 100 MG tablet Take 1 tablet (100 mg total) by mouth at bedtime as needed for sleep. 02/28/17   Meuth, Lina Sar, PA-C    ALLERGIES:  Allergies  Allergen Reactions  . Iodine Anaphylaxis and Swelling  . Shellfish Allergy Anaphylaxis    All shellfish  . Contrast Media [Iodinated Diagnostic Agents] Nausea And Vomiting    SOCIAL HISTORY:  Social History   Tobacco Use  . Smoking status: Current Every Day Smoker    Packs/day: 1.00    Years: 30.00    Pack years: 30.00    Types: Cigarettes  . Smokeless tobacco: Never Used  Substance Use Topics  . Alcohol use: Yes    Alcohol/week:  0.0 standard drinks    Comment: 1 40 oz 2 times a week "cause my gf does"    FAMILY HISTORY: Family History  Problem Relation Age of Onset  . CAD Mother   . Heart disease Mother   . Heart attack Mother   . Schizophrenia Mother   . CAD Sister     EXAM: BP 118/66 (BP Location: Right Arm)   Pulse 70   Resp 18   SpO2 99%  CONSTITUTIONAL: Alert and oriented and responds appropriately to questions. Well-appearing; well-nourished HEAD: Normocephalic EYES: Conjunctivae clear, pupils appear equal, EOMI ENT: normal nose; moist mucous membranes NECK: Supple, no meningismus, no nuchal rigidity, no LAD  CARD: RRR; S1 and S2 appreciated; no murmurs, no clicks, no rubs, no gallops RESP: Normal chest excursion without splinting or tachypnea; breath sounds clear and equal bilaterally; no wheezes, no rhonchi, no rales, no hypoxia or respiratory distress, speaking full sentences ABD/GI: Normal bowel sounds; non-distended; soft, non-tender, no rebound, no guarding, no peritoneal signs, no hepatosplenomegaly BACK:  The back appears normal and is non-tender to palpation, there is no CVA tenderness EXT: Normal ROM in all joints; non-tender to palpation; no edema; normal capillary refill; no cyanosis, no calf tenderness or swelling    SKIN: Normal color for age and race; warm; no rash NEURO: Moves all extremities equally PSYCH: The patient's mood and manner are appropriate. Grooming and personal hygiene are  appropriate.  MEDICAL DECISION MAKING: Here with complaints of chest pain, shaking, dizziness that feels like his previous anginal equivalent.  Last catheterization was in July 2018 which showed nonobstructive coronary artery disease but he did have an 80% stenosis at the first diagonal.  EKG today shows new ST changes in inferior leads but no STEMI.  Will obtain cardiac labs, chest x-ray.  Currently asymptomatic.  ED PROGRESS: Patient's labs are unremarkable.  Troponin negative.  Chest x-ray clear.  Given EKG changes with concerning story for patient's anginal equivalent will discuss with cardiology.   12:23 AM  D/w Dr. Shirlee Latch with cardiology.  He states that the EKG changes are not new compared to an EKG that was seen in March 2019.  He recommends medicine admission.   12:36 AM Discussed patient's case with hospitalist, Dr. Clyde Lundborg.  I have recommended admission and patient (and family if present) agree with this plan. Admitting physician will place admission orders.   I reviewed all nursing notes, vitals, pertinent previous records, EKGs, lab and urine results, imaging (as available).     EKG Interpretation  Date/Time:  Wednesday November 02 2017 23:31:56 EDT Ventricular Rate:  58 PR Interval:    QRS Duration: 116 QT Interval:  450 QTC Calculation: 442 R Axis:   -51 Text Interpretation:  Sinus rhythm Nonspecific IVCD with LAD Inferior infarct, age indeterminate Minimal ST elevation, anterior leads New ST changes in inferior leads compared to previous Confirmed by Mario Coronado, Baxter Hire [phone]) on 11/02/2017 11:37:57 PM           Darrion Macaulay, Layla Maw, DO 11/03/17 0036

## 2017-11-02 NOTE — ED Notes (Signed)
Patient transported to X-ray 

## 2017-11-02 NOTE — ED Notes (Signed)
Pt reports he has some uncontrollable shaking that lasted about an hour. Pt states it resolved on its own but feels he had his first anxiety attack.

## 2017-11-03 ENCOUNTER — Other Ambulatory Visit: Payer: Self-pay

## 2017-11-03 ENCOUNTER — Observation Stay (HOSPITAL_BASED_OUTPATIENT_CLINIC_OR_DEPARTMENT_OTHER): Payer: Medicare Other

## 2017-11-03 DIAGNOSIS — I2510 Atherosclerotic heart disease of native coronary artery without angina pectoris: Secondary | ICD-10-CM

## 2017-11-03 DIAGNOSIS — I10 Essential (primary) hypertension: Secondary | ICD-10-CM

## 2017-11-03 DIAGNOSIS — R079 Chest pain, unspecified: Secondary | ICD-10-CM

## 2017-11-03 DIAGNOSIS — F419 Anxiety disorder, unspecified: Secondary | ICD-10-CM | POA: Diagnosis not present

## 2017-11-03 DIAGNOSIS — I5022 Chronic systolic (congestive) heart failure: Secondary | ICD-10-CM | POA: Diagnosis not present

## 2017-11-03 DIAGNOSIS — R0789 Other chest pain: Principal | ICD-10-CM

## 2017-11-03 DIAGNOSIS — E785 Hyperlipidemia, unspecified: Secondary | ICD-10-CM

## 2017-11-03 DIAGNOSIS — K219 Gastro-esophageal reflux disease without esophagitis: Secondary | ICD-10-CM | POA: Diagnosis present

## 2017-11-03 DIAGNOSIS — Z9861 Coronary angioplasty status: Secondary | ICD-10-CM

## 2017-11-03 DIAGNOSIS — Z720 Tobacco use: Secondary | ICD-10-CM

## 2017-11-03 LAB — RAPID URINE DRUG SCREEN, HOSP PERFORMED
Amphetamines: NOT DETECTED
BARBITURATES: NOT DETECTED
BENZODIAZEPINES: NOT DETECTED
Cocaine: NOT DETECTED
OPIATES: NOT DETECTED
Tetrahydrocannabinol: POSITIVE — AB

## 2017-11-03 LAB — LIPID PANEL
CHOLESTEROL: 169 mg/dL (ref 0–200)
HDL: 40 mg/dL — AB (ref >40)
LDL Cholesterol: 113 mg/dL — ABNORMAL HIGH (ref 0–99)
TRIGLYCERIDES: 78 mg/dL (ref <150)
Total CHOL/HDL Ratio: 4.2 RATIO
VLDL: 16 mg/dL (ref 0–40)

## 2017-11-03 LAB — NM MYOCAR MULTI W/SPECT W/WALL MOTION / EF
CSEPEDS: 40 s
CSEPHR: 93 %
Estimated workload: 10.1 METS
Exercise duration (min): 8 min
MPHR: 180 {beats}/min
Peak HR: 169 {beats}/min
Rest HR: 62 {beats}/min

## 2017-11-03 LAB — TROPONIN I
Troponin I: <0.03 ng/mL (ref <0.03)
Troponin I: <0.03 ng/mL (ref <0.03)

## 2017-11-03 LAB — BRAIN NATRIURETIC PEPTIDE: B NATRIURETIC PEPTIDE 5: 138.6 pg/mL — AB (ref 0.0–100.0)

## 2017-11-03 LAB — HEMOGLOBIN A1C
HEMOGLOBIN A1C: 5.9 % — AB (ref 4.8–5.6)
MEAN PLASMA GLUCOSE: 122.63 mg/dL

## 2017-11-03 LAB — I-STAT TROPONIN, ED: TROPONIN I, POC: 0 ng/mL (ref 0.00–0.08)

## 2017-11-03 LAB — HIV ANTIBODY (ROUTINE TESTING W REFLEX): HIV SCREEN 4TH GENERATION: NONREACTIVE

## 2017-11-03 MED ORDER — NICOTINE 21 MG/24HR TD PT24
21.0000 mg | MEDICATED_PATCH | Freq: Every day | TRANSDERMAL | Status: DC
  Administered 2017-11-03: 21 mg via TRANSDERMAL
  Filled 2017-11-03: qty 1

## 2017-11-03 MED ORDER — METOPROLOL SUCCINATE 12.5 MG HALF TABLET
12.5000 mg | ORAL_TABLET | Freq: Every day | ORAL | Status: DC
  Filled 2017-11-03: qty 1

## 2017-11-03 MED ORDER — ONDANSETRON HCL 4 MG/2ML IJ SOLN: 4.0000 mg | Freq: Three times a day (TID) | INTRAMUSCULAR | Status: DC | PRN

## 2017-11-03 MED ORDER — TRAZODONE HCL 50 MG PO TABS: 100.0000 mg | ORAL_TABLET | Freq: Every evening | ORAL | Status: DC | PRN

## 2017-11-03 MED ORDER — PROMETHAZINE HCL 25 MG PO TABS: 25.0000 mg | ORAL_TABLET | Freq: Four times a day (QID) | ORAL | Status: DC | PRN

## 2017-11-03 MED ORDER — ACETAMINOPHEN 325 MG PO TABS: 650.0000 mg | ORAL_TABLET | ORAL | Status: DC | PRN

## 2017-11-03 MED ORDER — TECHNETIUM TC 99M TETROFOSMIN IV KIT
10.0000 | PACK | Freq: Once | INTRAVENOUS | Status: AC | PRN
  Administered 2017-11-03: 10 via INTRAVENOUS

## 2017-11-03 MED ORDER — PANTOPRAZOLE SODIUM 40 MG PO TBEC
40.0000 mg | DELAYED_RELEASE_TABLET | Freq: Every day | ORAL | Status: DC
  Administered 2017-11-03: 40 mg via ORAL
  Filled 2017-11-03: qty 1

## 2017-11-03 MED ORDER — HYDRALAZINE HCL 20 MG/ML IJ SOLN: 5.0000 mg | INTRAMUSCULAR | Status: DC | PRN

## 2017-11-03 MED ORDER — ARIPIPRAZOLE 5 MG PO TABS
15.0000 mg | ORAL_TABLET | Freq: Every day | ORAL | Status: DC
  Administered 2017-11-03: 15 mg via ORAL
  Filled 2017-11-03: qty 1

## 2017-11-03 MED ORDER — ENOXAPARIN SODIUM 40 MG/0.4ML ~~LOC~~ SOLN
40.0000 mg | SUBCUTANEOUS | Status: DC
  Administered 2017-11-03: 40 mg via SUBCUTANEOUS
  Filled 2017-11-03: qty 0.4

## 2017-11-03 MED ORDER — MORPHINE SULFATE (PF) 2 MG/ML IV SOLN: 2.0000 mg | INTRAVENOUS | Status: DC | PRN

## 2017-11-03 MED ORDER — NITROGLYCERIN 0.4 MG SL SUBL: 0.4000 mg | SUBLINGUAL_TABLET | SUBLINGUAL | Status: DC | PRN

## 2017-11-03 MED ORDER — LISINOPRIL 5 MG PO TABS
5.0000 mg | ORAL_TABLET | Freq: Every day | ORAL | Status: DC
  Administered 2017-11-03: 5 mg via ORAL
  Filled 2017-11-03: qty 1

## 2017-11-03 MED ORDER — ALPRAZOLAM 0.25 MG PO TABS: 0.2500 mg | ORAL_TABLET | Freq: Two times a day (BID) | ORAL | Status: DC | PRN

## 2017-11-03 MED ORDER — TECHNETIUM TC 99M TETROFOSMIN IV KIT
30.0000 | PACK | Freq: Once | INTRAVENOUS | Status: AC | PRN
  Administered 2017-11-03: 30 via INTRAVENOUS

## 2017-11-03 MED ORDER — ASPIRIN EC 81 MG PO TBEC
81.0000 mg | DELAYED_RELEASE_TABLET | Freq: Every day | ORAL | Status: DC
  Administered 2017-11-03: 81 mg via ORAL
  Filled 2017-11-03: qty 1

## 2017-11-03 MED ORDER — ROSUVASTATIN CALCIUM 40 MG PO TABS
40.0000 mg | ORAL_TABLET | Freq: Every day | ORAL | Status: DC
  Administered 2017-11-03: 40 mg via ORAL
  Filled 2017-11-03: qty 2
  Filled 2017-11-03: qty 1

## 2017-11-03 MED ORDER — ZOLPIDEM TARTRATE 5 MG PO TABS: 5.0000 mg | ORAL_TABLET | Freq: Every evening | ORAL | Status: DC | PRN

## 2017-11-03 MED ORDER — CLOPIDOGREL BISULFATE 75 MG PO TABS
75.0000 mg | ORAL_TABLET | Freq: Every day | ORAL | Status: DC
  Administered 2017-11-03: 75 mg via ORAL
  Filled 2017-11-03: qty 1

## 2017-11-03 NOTE — Progress Notes (Signed)
Pt to neuc med.

## 2017-11-03 NOTE — Progress Notes (Signed)
Pt alert and oriented in NAD. Pt verbalized understanding of discharge instructions. 

## 2017-11-03 NOTE — H&P (Signed)
History and Physical    [name] ZOX:[id number] DOB: December 29, 1975 DOA: 11/02/2017  Referring MD/NP/PA:   PCP: Massie Maroon, FNP   Patient coming from:  The patient is coming from home.  At baseline, pt is independent for most of ADL.   Chief Complaint: Chest pressure  HPI: [name] is a 42 y.o. male with medical history significant of hypertension, hyperlipidemia, GERD, anxiety, bipolar disorder, schizophrenia, CAD, MI, s/p of stent placement, NSVT, polysubstance abuse, tobacco abuse, sCHF with EF of 45%, who presents with chest pressure.  Patient states that he has had body shaking for about an hour from 9:45-10:45 PM. He dose not know why had was shaking, but it was not seizure. No fever. After that, he started having chest pressure.  The chest pressure is located in the left side of chest, constant, nonradiating, initially 3-4 out of 10 severity, currently no chest pressure or chest pain.  It is associated with dizziness, but no shortness of breath, fever or chills.  He has dry cough which he attributes to smoking.  No nausea vomiting, diarrhea, abdominal, symptoms of UTI or unilateral weakness.  ED Course: pt was found to have negative troponin, WBC 6.3, creatinine 1.25, BUN 7, GFR 60, temperature normal, bradycardia, oxygen saturation 92% on room air, negative chest x-ray.  Patient is placed on telemetry bed for observation. ED physician discussed with her cardiologist, Dr. Shirlee Latch.  Review of Systems:   General: no fevers, chills, no body weight gain, has fatigue HEENT: no blurry vision, hearing changes or sore throat Respiratory: no dyspnea, coughing, wheezing CV: has chest pressure,  no palpitations GI: no nausea, vomiting, abdominal pain, diarrhea, constipation GU: no dysuria, burning on urination, increased urinary frequency, hematuria  Ext: no leg edema Neuro: no unilateral weakness, numbness, or tingling, no vision change or hearing loss Skin: no rash, no  skin tear. MSK: No muscle spasm, no deformity, no limitation of range of movement in spin Heme: No easy bruising.  Travel history: No recent long distant travel.  Allergy:  Allergies  Allergen Reactions  . Iodine Anaphylaxis and Swelling  . Shellfish Allergy Anaphylaxis    All shellfish  . Contrast Media [Iodinated Diagnostic Agents] Nausea And Vomiting    Past Medical History:  Diagnosis Date  . Anxiety   . Bipolar 1 disorder (HCC)   . CAD S/P percutaneous coronary angioplasty    a. Inf STEMI s/p BMS to RCA 01/2007. b. NSTEMI 07/2013 s/p PTCA to RCA for ISR; c. Transient inferior ST elevation (peak Ti 0.25) 01/2014 s/p PTCA/DES to pRCA, PTCA/DES to dRCA, EF 50%; d. 12/2014 Inf STEMI: RCA patent prox stent, 160m/d (3.0x32 Synergy DES), EF 35-45; e. 05/2015 Inf STEMI/Cath: LM nl, LAD 10ost/m, D1 75, LCX nl, OM1 25, OM2 30, RCA patent stents, RPDA 30ost.  . Hx of medication noncompliance   . Hyperlipidemia   . Hypertensive heart disease   . Ischemic cardiomyopathy    a. EF 40% in 2011, 60% in 2012. b. EF 55% by cath 07/2013. c. EF 50% by cath 01/2014; d. 12/2014 EF 35-45% by LV gram; e. 05/2015 Echo: EF 50-55% inflat/inf AK, mild AI; f. 06/2015 cMRI: EF 49%, basal & mid inf full thickness scar, subendocardial scar in antsept and antlat wall, correlating w/ D1 dzs.  . Myocardial infarction [facility])    pt report   . NSVT (nonsustained ventricular tachycardia) (HCC)    a. Very brief run during 07/2013 admit for NSTEMI felt due to MI.  Marland Kitchen  Polysubstance abuse (HCC)    a. h/o tobacco, marijuana and crack cocaine use. b. 07/2013: +UDS THC, neg for cocaine.  . Schizophrenia (HCC)    pt report   . Tobacco abuse     Past Surgical History:  Procedure Laterality Date  . CARDIAC CATHETERIZATION  01/16/2009   normal left main, Cfx with 2-OMs both w/minor irregularities, LAd with 20-30% mid region irregularities, ramus intermediate/optional diagonal with 60% osital narrowing, RCA with stent in distal portion  w/20% prox in-stent stenosis (Dr. Mervyn Skeeters. [facility])  . CARDIAC CATHETERIZATION  07/23/2013   two vessel obstructive CAD, occluded first diagonal, focal in-stent restenosis in distal RCA (Dr. Peter Swaziland)  . CARDIAC CATHETERIZATION N/A 01/10/2015   Procedure: Left Heart Cath and Coronary Angiography;  Surgeon: Marykay Lex, MD;  Location: [facility] INVASIVE CV LAB;  Service: Cardiovascular;  Laterality: N/A;  . CARDIAC CATHETERIZATION N/A 01/10/2015   Procedure: Coronary Stent Intervention;  Surgeon: Marykay Lex, MD;  Location: [facility] INVASIVE CV LAB;  Service: Cardiovascular;  Laterality: N/A;  . CARDIAC CATHETERIZATION N/A 06/10/2015   Procedure: Left Heart Cath and Coronary Angiography;  Surgeon: Peter M Swaziland, MD;  Location: [facility] INVASIVE CV LAB;  Service: Cardiovascular;  Laterality: N/A;  . CARDIAC CATHETERIZATION N/A 09/27/2015   Procedure: Left Heart Cath and Coronary Angiography;  Surgeon: Corky Crafts, MD;  Location: [facility] INVASIVE CV LAB;  Service: Cardiovascular;  Laterality: N/A;  . CARDIAC CATHETERIZATION N/A 09/27/2015   Procedure: Coronary Balloon Angioplasty;  Surgeon: Corky Crafts, MD;  Location: [facility] INVASIVE CV LAB;  Service: Cardiovascular;  Laterality: N/A;  . CORONARY ANGIOPLASTY WITH STENT PLACEMENT  02/05/2007   3.5x88mm Quantum non-DES to RCA (Dr. Nicki Guadalajara)  . FEMORAL ARTERY STENT    . LAPAROTOMY N/A 02/20/2017   Procedure: EXPLORATORY LAPAROTOMY AND REPAIR, PATCH, OR REMOVE PERFORATION IN BOWEL;  Surgeon: Emelia Loron, MD;  Location: [facility] OR;  Service: General;  Laterality: N/A;  . LEFT AND RIGHT HEART CATHETERIZATION WITH CORONARY ANGIOGRAM N/A 01/14/2014   Procedure: LEFT AND RIGHT HEART CATHETERIZATION WITH CORONARY ANGIOGRAM;  Surgeon: Kathleene Hazel, MD;  Location[facility] CATH LAB;  Service: Cardiovascular;  Laterality: N/A;  . LEFT HEART CATH AND CORONARY ANGIOGRAPHY N/A 07/12/2016   Procedure: Left Heart Cath and Coronary Angiography;  Surgeon: Swaziland, Peter M,  MD;  Location: [facility] INVASIVE CV LAB;  Service: Cardiovascular;  Laterality: N/A;  . LEFT HEART CATHETERIZATION WITH CORONARY ANGIOGRAM N/A 07/23/2013   Procedure: LEFT HEART CATHETERIZATION WITH CORONARY ANGIOGRAM;  Surgeon: Peter M Swaziland, MD;  Location: [facility] CATH LAB;  Service: Cardiovascular;  Laterality: N/A;    Social History:  reports that he has been smoking cigarettes. He has a 30.00 pack-year smoking history. He has never used smokeless tobacco. He reports that he drinks alcohol. He reports that he has current or past drug history. Drug: Marijuana.  Family History:  Family History  Problem Relation Age of Onset  . CAD Mother   . Heart disease Mother   . Heart attack Mother   . Schizophrenia Mother   . CAD Sister      Prior to Admission medications   Medication Sig Start Date End Date Taking? Authorizing Provider  ARIPiprazole (ABILIFY) 15 MG tablet Take 1 tablet (15 mg total) by mouth daily. 02/28/17   Meuth, Lina Sar, PA-C  aspirin EC 81 MG tablet Take 81 mg daily by mouth.    [provider]  clopidogrel (PLAVIX) 75 MG tablet Take 1 tablet (75 mg total)  by mouth daily. 03/11/17   Lennette Bihari, MD  lisinopril (PRINIVIL,ZESTRIL) 5 MG tablet Take 1 tablet (5 mg total) by mouth daily. 03/11/17   Lennette Bihari, MD  metoprolol succinate (TOPROL XL) 25 MG 24 hr tablet Take 0.5 tablets (12.5 mg total) by mouth daily. 03/11/17   Lennette Bihari, MD  nicotine polacrilex (NICORETTE) 2 MG gum Take 1 each (2 mg total) by mouth as needed for smoking cessation. (May buy from over the counter): Smoking cessation 10/25/16   Armandina Stammer I, NP  nitroGLYCERIN (NITROSTAT) 0.4 MG SL tablet Place 1 tablet (0.4 mg total) under the tongue every 5 (five) minutes x 3 doses as needed for chest pain. 10/25/16   Armandina Stammer I, NP  ondansetron (ZOFRAN ODT) 4 MG disintegrating tablet Take 1 tablet (4 mg total) by mouth every 8 (eight) hours as needed. 02/19/17   Mabe, Latanya Maudlin, MD  oxyCODONE (OXY  IR/ROXICODONE) 5 MG immediate release tablet Take 1 tablet (5 mg total) by mouth every 6 (six) hours as needed for severe pain. 02/28/17   Meuth, Brooke A, PA-C  pantoprazole (PROTONIX) 40 MG tablet Take 1 tablet (40 mg total) by mouth daily. 03/11/17   Lennette Bihari, MD  promethazine (PHENERGAN) 25 MG tablet Take 1 tablet (25 mg total) by mouth every 6 (six) hours as needed for nausea or vomiting. 02/19/17   Mabe, Latanya Maudlin, MD  rosuvastatin (CRESTOR) 40 MG tablet Take 1 tablet (40 mg total) by mouth daily. 03/11/17   Lennette Bihari, MD  saccharomyces boulardii (FLORASTOR) 250 MG capsule Take 1 capsule (250 mg total) by mouth 2 (two) times daily. You can get a probiotic over the counter 02/28/17   Meuth, Brooke A, PA-C  traZODone (DESYREL) 100 MG tablet Take 1 tablet (100 mg total) by mouth at bedtime as needed for sleep. 02/28/17   Franne Forts, PA-C    Physical Exam: Vitals:   11/03/17 0245 11/03/17 0300 11/03/17 0330 11/03/17 0357  BP:  103/63 111/69 121/80  Pulse: (!) 52 61 (!) 50 (!) 58  Resp: 18 18 16 18   Temp:    97.8 F (36.6 C)  TempSrc:    Oral  SpO2: 98% 98% 97% 100%  Weight:    83 kg  Height:    6' (1.829 m)   General: Not in acute distress HEENT:       Eyes: PERRL, EOMI, no scleral icterus.       ENT: No discharge from the ears and nose, no pharynx injection, no tonsillar enlargement.        Neck: No JVD, no bruit, no mass felt. Heme: No neck lymph node enlargement. Cardiac: S1/S2, RRR, No murmurs, No gallops or rubs. Respiratory: No rales, wheezing, rhonchi or rubs. GI: Soft, nondistended, nontender, no rebound pain, no organomegaly, BS present. GU: No hematuria Ext: No pitting leg edema bilaterally. 2+DP/PT pulse bilaterally. Musculoskeletal: No joint deformities, No joint redness or warmth, no limitation of ROM in spin. Skin: No rashes.  Neuro: Alert, oriented X3, cranial nerves II-XII grossly intact, moves all extremities normally Psych: Patient is not psychotic, no  suicidal or hemocidal ideation.  Labs on Admission: I have personally reviewed following labs and imaging studies  CBC: Recent Labs  Lab 11/02/17 2301  WBC 6.3  HGB 11.6*  HCT 36.0*  MCV 90.2  PLT 264   Basic Metabolic Panel: Recent Labs  Lab 11/02/17 2301  NA 135  K 3.8  CL 106  CO2 23  GLUCOSE 124*  BUN 7  CREATININE 1.25*  CALCIUM 9.0   GFR: Estimated Creatinine Clearance: 86.2 mL/min (A) (by C-G formula based on SCr of 1.25 mg/dL (H)). Liver Function Tests: No results for input(s): AST, ALT, ALKPHOS, BILITOT, PROT, ALBUMIN in the last 168 hours. No results for input(s): LIPASE, AMYLASE in the last 168 hours. No results for input(s): AMMONIA in the last 168 hours. Coagulation Profile: No results for input(s): INR, PROTIME in the last 168 hours. Cardiac Enzymes: Recent Labs  Lab 11/03/17 0322  TROPONINI <0.03   BNP (last 3 results) No results for input(s): PROBNP in the last 8760 hours. HbA1C: Recent Labs    11/03/17 0322  HGBA1C 5.9*   CBG: No results for input(s): GLUCAP in the last 168 hours. Lipid Profile: Recent Labs    11/03/17 0322  CHOL 169  HDL 40*  LDLCALC 113*  TRIG 78  CHOLHDL 4.2   Thyroid Function Tests: No results for input(s): TSH, T4TOTAL, FREET4, T3FREE, THYROIDAB in the last 72 hours. Anemia Panel: No results for input(s): VITAMINB12, FOLATE, FERRITIN, TIBC, IRON, RETICCTPCT in the last 72 hours. Urine analysis:    Component Value Date/Time   COLORURINE AMBER (A) 02/19/2017 1742   APPEARANCEUR HAZY (A) 02/19/2017 1742   LABSPEC 1.034 (H) 02/19/2017 1742   PHURINE 6.0 02/19/2017 1742   GLUCOSEU 50 (A) 02/19/2017 1742   HGBUR NEGATIVE 02/19/2017 1742   HGBUR negative 01/27/2009 1039   BILIRUBINUR SMALL (A) 02/19/2017 1742   KETONESUR 80 (A) 02/19/2017 1742   PROTEINUR 100 (A) 02/19/2017 1742   UROBILINOGEN 0.2 07/07/2015 1150   NITRITE NEGATIVE 02/19/2017 1742   LEUKOCYTESUR NEGATIVE 02/19/2017 1742   Sepsis  Labs: @LABRCNTIP (procalcitonin:4,lacticidven:4) )No results found for this or any previous visit (from the past 240 hour(s)).   Radiological Exams on Admission: Dg Chest 2 View  Result Date: 11/02/2017 CLINICAL DATA:  Chest pain. EXAM: CHEST - 2 VIEW COMPARISON:  Radiograph 02/22/2017 FINDINGS: The cardiomediastinal contours are normal. Coronary stent visualized. The lungs are clear. Pulmonary vasculature is normal. No consolidation, pleural effusion, or pneumothorax. No acute osseous abnormalities are seen. IMPRESSION: No acute findings. Electronically Signed   By: Narda Rutherford M.D.   On: 11/02/2017 23:52     EKG: Independently reviewed.  Sinus rhythm, QTC 442, early R wave progression, T wave inversion in inferior leads, which is similar to previous EKG on 03/11/2017.   Assessment/Plan Principal Problem:   Chest pressure Active Problems:   Essential hypertension   CAD S/P BMS PCI to RCA with PTCA for ISR --> followed by stent thrombosis - DES PCI   HLD (hyperlipidemia)   Tobacco abuse   Anxiety   Chronic systolic CHF (congestive heart failure) (HCC)   GERD (gastroesophageal reflux disease)   Chest pressure and hx of CAD: s/p of stent. Patient has some mild chest pressure, etiology is not clear, may be due to anxiety or panic attack, need to r/o drug abuse. Will cycle trop-->if negative, may d/c home and f/u with card.  ED physician discussed with cardiologist, Dr. Shirlee Latch, who did not think T wave inversion inferiorly is new.  - will place on Tele bed for obs - cycle CE q6 x3 and repeat EKG in the am  - prn Nitroglycerin, Morphine, and aspirin, plavix, crestor, metoprolol - Risk factor stratification: will check FLP, UDS and A1C  - did not order 2d echo--> please reevaluate pt in AM to decide if pt needs 2D echo.  Essential hypertension: -  IV Hydralazine prn -Continue home medications: Lisinopril, metoprolol  HLD (hyperlipidemia): -Crestor  Tobacco abuse: -Did counseling  about importance of quitting smoking -Nicotine patch  Anxiety: -Xanax PRN  GERD: -Protonix  Chronic systolic CHF (congestive heart failure) (HCC): 2D echo on 02/22/2017 showed EF of 45-50%.  Patient does not leg edema.  No respiratory distress.  No pulmonary edema on chest x-ray . CHF is compensated - Check BNP   DVT ppx:   SQ Lovenox Code Status: Full code Family Communication: None at bed side.     Disposition Plan:  Anticipate discharge back to previous home environment Consults called: ED physician discussed with her cardiologist, Dr. Shirlee Latch Admission status: Obs / tele     Date of Service 11/03/2017    Lorretta Harp [facility] Pager [phone]  If 7PM-7AM, please contact night-coverage [email or web address] Password [facility] 11/03/2017, 6:54 AM

## 2017-11-03 NOTE — Progress Notes (Signed)
Nuc result returned "Abnormal, intermediate risk stress nuclear study with large prior inferior infarct.  No ischemia.  Gated ejection fraction 38% with inferior akinesis.  Moderate left ventricular enlargement.  Study interpreted as intermediate risk due to reduced LV function." Read by Dr. Jens Som who did consult. He feels patient can be discharged as test does not show any ischemia. I touched base with Robbie Lis who did original consult and she stated she planned to let IM know. Dayna Dunn PA-C

## 2017-11-03 NOTE — Discharge Summary (Signed)
Physician Discharge Summary  [name] ZOX:[id number] DOB: 1975/01/13 DOA: 11/02/2017  PCP: [facility]  Admit date: 11/02/2017 Discharge date: 11/03/2017  Admitted From: home Discharge disposition: home   Recommendations for Outpatient Follow-Up:   1. Outpatient risk factor modification 2. Smoking cessation   Discharge Diagnosis:   Principal Problem:   Chest pressure Active Problems:   Essential hypertension   CAD S/P BMS PCI to RCA with PTCA for ISR --> followed by stent thrombosis - DES PCI   HLD (hyperlipidemia)   Tobacco abuse   Anxiety   Chronic systolic CHF (congestive heart failure) (HCC)   GERD (gastroesophageal reflux disease)    Discharge Condition: Improved.  Diet recommendation: Low sodium, heart healthy  Wound care: None.  Code status: Full.   History of Present Illness:   [name] is a 42 y.o. male with medical history significant of hypertension, hyperlipidemia, GERD, anxiety, bipolar disorder, schizophrenia, CAD, MI, s/p of stent placement, NSVT, polysubstance abuse, tobacco abuse, sCHF with EF of 45%, who presents with chest pressure.  Patient states that he has had body shaking for about an hour from 9:45-10:45 PM. He dose not know why had was shaking, but it was not seizure. No fever. After that, he started having chest pressure.  The chest pressure is located in the left side of chest, constant, nonradiating, initially 3-4 out of 10 severity, currently no chest pressure or chest pain.  It is associated with dizziness, but no shortness of breath, fever or chills.  He has dry cough which he attributes to smoking.  No nausea vomiting, diarrhea, abdominal, symptoms of UTI or unilateral weakness.   Hospital Course by Problem:   Chest pain -CE negative -seen by cards: s/p stress test-- no reversible ischemia-- continue ASA/statin-- d/c plavix  Marijuana/tobacco abuse -encourage cessation    Medical  Consultants:      Discharge Exam:   Vitals:   11/03/17 1238 11/03/17 1400  BP:  115/85  Pulse: 98 77  Resp:  16  Temp:  98.5 F (36.9 C)  SpO2:  100%   Vitals:   11/03/17 1236 11/03/17 1237 11/03/17 1238 11/03/17 1400  BP: (!) 169/84   115/85  Pulse: (!) 108 97 98 77  Resp:    16  Temp:    98.5 F (36.9 C)  TempSrc:    Oral  SpO2:    100%  Weight:      Height:        General exam: Appears calm and comfortable. Says he is feeling better    The results of significant diagnostics from this hospitalization (including imaging, microbiology, ancillary and laboratory) are listed below for reference.     Procedures and Diagnostic Studies:   Dg Chest 2 View  Result Date: 11/02/2017 CLINICAL DATA:  Chest pain. EXAM: CHEST - 2 VIEW COMPARISON:  Radiograph 02/22/2017 FINDINGS: The cardiomediastinal contours are normal. Coronary stent visualized. The lungs are clear. Pulmonary vasculature is normal. No consolidation, pleural effusion, or pneumothorax. No acute osseous abnormalities are seen. IMPRESSION: No acute findings. Electronically Signed   By: Narda Rutherford M.D.   On: 11/02/2017 23:52   Nm Myocar Multi W/spect W/wall Motion / Ef  Result Date: 11/03/2017  Blood pressure demonstrated a normal response to exercise.  There was no ST segment deviation noted during stress.  Defect 1: There is a large defect of severe severity present in the basal inferior, mid inferior and apical inferior location.  Findings consistent with prior myocardial infarction.  This is an intermediate risk study.  The left ventricular ejection fraction is moderately decreased (30-44%).  Nuclear stress EF: 38%.  Abnormal, intermediate risk stress nuclear study with large prior inferior infarct.  No ischemia.  Gated ejection fraction 38% with inferior akinesis.  Moderate left ventricular enlargement.  Study interpreted as intermediate risk due to reduced LV function.     Labs:   Basic Metabolic  Panel: Recent Labs  Lab 11/02/17 2301  NA 135  K 3.8  CL 106  CO2 23  GLUCOSE 124*  BUN 7  CREATININE 1.25*  CALCIUM 9.0   GFR Estimated Creatinine Clearance: 86.2 mL/min (A) (by C-G formula based on SCr of 1.25 mg/dL (H)). Liver Function Tests: No results for input(s): AST, ALT, ALKPHOS, BILITOT, PROT, ALBUMIN in the last 168 hours. No results for input(s): LIPASE, AMYLASE in the last 168 hours. No results for input(s): AMMONIA in the last 168 hours. Coagulation profile No results for input(s): INR, PROTIME in the last 168 hours.  CBC: Recent Labs  Lab 11/02/17 2301  WBC 6.3  HGB 11.6*  HCT 36.0*  MCV 90.2  PLT 264   Cardiac Enzymes: Recent Labs  Lab 11/03/17 0322 11/03/17 0906  TROPONINI <0.03 <0.03   BNP: Invalid input(s): POCBNP CBG: No results for input(s): GLUCAP in the last 168 hours. D-Dimer No results for input(s): DDIMER in the last 72 hours. Hgb A1c Recent Labs    11/03/17 0322  HGBA1C 5.9*   Lipid Profile Recent Labs    11/03/17 0322  CHOL 169  HDL 40*  LDLCALC 113*  TRIG 78  CHOLHDL 4.2   Thyroid function studies No results for input(s): TSH, T4TOTAL, T3FREE, THYROIDAB in the last 72 hours.  Invalid input(s): FREET3 Anemia work up No results for input(s): VITAMINB12, FOLATE, FERRITIN, TIBC, IRON, RETICCTPCT in the last 72 hours. Microbiology No results found for this or any previous visit (from the past 240 hour(s)).   Discharge Instructions:   Discharge Instructions    Diet - low sodium heart healthy   Complete by:  As directed    Discharge instructions   Complete by:  As directed    Stop smoking!   Increase activity slowly   Complete by:  As directed      Allergies as of 11/03/2017      Reactions   Iodine Anaphylaxis, Swelling   Shellfish Allergy Anaphylaxis   All shellfish   Contrast Media [iodinated Diagnostic Agents] Nausea And Vomiting      Medication List    STOP taking these medications   clopidogrel 75  MG tablet Commonly known as:  PLAVIX   ondansetron 4 MG disintegrating tablet Commonly known as:  ZOFRAN-ODT   oxyCODONE 5 MG immediate release tablet Commonly known as:  Oxy IR/ROXICODONE   pantoprazole 40 MG tablet Commonly known as:  PROTONIX   promethazine 25 MG tablet Commonly known as:  PHENERGAN   saccharomyces boulardii 250 MG capsule Commonly known as:  FLORASTOR     TAKE these medications   ARIPiprazole 15 MG tablet Commonly known as:  ABILIFY Take 1 tablet (15 mg total) by mouth daily.   aspirin EC 81 MG tablet Take 81 mg daily by mouth.   lisinopril 5 MG tablet Commonly known as:  PRINIVIL,ZESTRIL Take 1 tablet (5 mg total) by mouth daily.   metoprolol succinate 25 MG 24 hr tablet Commonly known as:  TOPROL-XL Take 0.5 tablets (12.5 mg total) by mouth daily.  nicotine 21 mg/24hr patch Commonly known as:  NICODERM CQ - dosed in mg/24 hours Place 21 mg onto the skin daily.   nicotine polacrilex 4 MG lozenge Commonly known as:  COMMIT Take 4 mg by mouth daily as needed for smoking cessation.   nicotine polacrilex 2 MG gum Commonly known as:  NICORETTE Take 1 each (2 mg total) by mouth as needed for smoking cessation. (May buy from over the counter): Smoking cessation   nitroGLYCERIN 0.4 MG SL tablet Commonly known as:  NITROSTAT Place 1 tablet (0.4 mg total) under the tongue every 5 (five) minutes x 3 doses as needed for chest pain.   rosuvastatin 40 MG tablet Commonly known as:  CRESTOR Take 1 tablet (40 mg total) by mouth daily.   traZODone 100 MG tablet Commonly known as:  DESYREL Take 1 tablet (100 mg total) by mouth at bedtime as needed for sleep.      Follow-up Information    Lennette Bihari, MD .   Specialty:  Cardiology Contact information: [address] [place] [address] [phone]        [facility] Follow up in 1 week(s).   Specialty:  Family Medicine Contact information: [address] [place] [address] [phone]            Time coordinating discharge: 25 min  Signed:  Joseph Art  [facility] 11/03/2017, 4:33 PM

## 2017-11-03 NOTE — Progress Notes (Signed)
metopolol held per cards, pt prefers treadmill test

## 2017-11-03 NOTE — Consult Note (Addendum)
Cardiology Consultation:   Patient ID: [name] MRN: [id number]; DOB: 08-29-1975  Admit date: 11/02/2017 Date of Consult: 11/03/2017  Primary Care Provider: Massie Maroon, FNP Primary Cardiologist: Nicki Guadalajara, MD  Primary Electrophysiologist:  None   Patient Profile:   [name] is a 42 y.o. male with a hx of CAD with multiple MIs and coronary stenting as outlined below, chronic systolic HF, aortic insuffiencey, HTN, tobacco use, HLD and bipolar disorder who is being seen today for the evaluation of CP at the request of Dr. Benjamine Mola, Internal Medicine.   History of Present Illness:   [name] is a 37 -year-old, African American male, followed by Dr. Tresa Endo, with a history of CAD, who suffered an inferior wall ST segment elevation myocardial infarction and underwent bare-metal stenting to his RCA in 2009.  He also has a history of hypertension, hyperlipidemia, bipolar disorder, tobacco use, and marijuana use.  In the medical records, there is indication of possible crack cocaine use, but in the past, the patient vehemently denied this.  He was readmitted to [facility] on 07/21/2013 with recurrent CP and he ruled in for a non-STEMI with a peak troponin of 16.  He had transient nonsustained VT on telemetry, related to his infarct.  He was premedicated for possible dye allergy, and underwent cardiac catheterization by Dr. Swaziland, which revealed occlusion of the first diagonal vessel, and focal restenosis within the previously placed distal RCA stent.  This was treated with 3.5 x 10 mm cutting balloon intervention to the in-stent restenosis with plans for medical therapy for the diagonal occlusion.  He has had multiple repeat cath since then. The most recent one being July 2018 for CP. He was found to have single-vessel obstruction involving a small diagonal branch, with mild stenoses in the LAD and circumflex with continued patency of stents in the proximal and distal RCA.  He  had mild-to-moderate LV dysfunction with an EF of 45% and mild inferior akinesis.  there were no new lesions to explain his CP at that time and continued medical management was recommended.   He now presents with recurrent symptoms similar to his prior MIs. He has always presented with usual symptoms. He has always had the bad "skakes' all over his body each time he needed a stent and mild SSCP. He was in his usual state of health until yesterday morning. He started shaking all over and called 911 after 45 min of this. When they arrived, the put him on the stretcher to transport him to the ED and he developed SSCP described as pressure. They gave him 4 baby ASA and CP resolved after 5 min. His shakes lasted ~1 hr. He denies any recurrent CP since yesterday morning. Troponin is negative x. 3. EKG shows Sinus rhythm Nonspecific IVCD with LAD and inferior TWIs. CXR negative.  Past Medical History:  Diagnosis Date  . Anxiety   . Bipolar 1 disorder (HCC)   . CAD S/P percutaneous coronary angioplasty    a. Inf STEMI s/p BMS to RCA 01/2007. b. NSTEMI 07/2013 s/p PTCA to RCA for ISR; c. Transient inferior ST elevation (peak Ti 0.25) 01/2014 s/p PTCA/DES to pRCA, PTCA/DES to dRCA, EF 50%; d. 12/2014 Inf STEMI: RCA patent prox stent, 179m/d (3.0x32 Synergy DES), EF 35-45; e. 05/2015 Inf STEMI/Cath: LM nl, LAD 10ost/m, D1 75, LCX nl, OM1 25, OM2 30, RCA patent stents, RPDA 30ost.  . Hx of medication noncompliance   . Hyperlipidemia   . Hypertensive  heart disease   . Ischemic cardiomyopathy    a. EF 40% in 2011, 60% in 2012. b. EF 55% by cath 07/2013. c. EF 50% by cath 01/2014; d. 12/2014 EF 35-45% by LV gram; e. 05/2015 Echo: EF 50-55% inflat/inf AK, mild AI; f. 06/2015 cMRI: EF 49%, basal & mid inf full thickness scar, subendocardial scar in antsept and antlat wall, correlating w/ D1 dzs.  . Myocardial infarction [facility])    pt report   . NSVT (nonsustained ventricular tachycardia) (HCC)    a. Very brief run during  07/2013 admit for NSTEMI felt due to MI.  . Polysubstance abuse (HCC)    a. h/o tobacco, marijuana and crack cocaine use. b. 07/2013: +UDS THC, neg for cocaine.  . Schizophrenia (HCC)    pt report   . Tobacco abuse     Past Surgical History:  Procedure Laterality Date  . CARDIAC CATHETERIZATION  01/16/2009   normal left main, Cfx with 2-OMs both w/minor irregularities, LAd with 20-30% mid region irregularities, ramus intermediate/optional diagonal with 60% osital narrowing, RCA with stent in distal portion w/20% prox in-stent stenosis (Dr. Mervyn Skeeters. [facility])  . CARDIAC CATHETERIZATION  07/23/2013   two vessel obstructive CAD, occluded first diagonal, focal in-stent restenosis in distal RCA (Dr. Peter Swaziland)  . CARDIAC CATHETERIZATION N/A 01/10/2015   Procedure: Left Heart Cath and Coronary Angiography;  Surgeon: Marykay Lex, MD;  Location: [facility] INVASIVE CV LAB;  Service: Cardiovascular;  Laterality: N/A;  . CARDIAC CATHETERIZATION N/A 01/10/2015   Procedure: Coronary Stent Intervention;  Surgeon: Marykay Lex, MD;  Location: [facility] INVASIVE CV LAB;  Service: Cardiovascular;  Laterality: N/A;  . CARDIAC CATHETERIZATION N/A 06/10/2015   Procedure: Left Heart Cath and Coronary Angiography;  Surgeon: Peter M Swaziland, MD;  Location: [facility] INVASIVE CV LAB;  Service: Cardiovascular;  Laterality: N/A;  . CARDIAC CATHETERIZATION N/A 09/27/2015   Procedure: Left Heart Cath and Coronary Angiography;  Surgeon: Corky Crafts, MD;  Location: [facility] INVASIVE CV LAB;  Service: Cardiovascular;  Laterality: N/A;  . CARDIAC CATHETERIZATION N/A 09/27/2015   Procedure: Coronary Balloon Angioplasty;  Surgeon: Corky Crafts, MD;  Location: [facility] INVASIVE CV LAB;  Service: Cardiovascular;  Laterality: N/A;  . CORONARY ANGIOPLASTY WITH STENT PLACEMENT  02/05/2007   3.5x48mm Quantum non-DES to RCA (Dr. Nicki Guadalajara)  . FEMORAL ARTERY STENT    . LAPAROTOMY N/A 02/20/2017   Procedure: EXPLORATORY LAPAROTOMY AND REPAIR, PATCH, OR  REMOVE PERFORATION IN BOWEL;  Surgeon: Emelia Loron, MD;  Location: [facility] OR;  Service: General;  Laterality: N/A;  . LEFT AND RIGHT HEART CATHETERIZATION WITH CORONARY ANGIOGRAM N/A 01/14/2014   Procedure: LEFT AND RIGHT HEART CATHETERIZATION WITH CORONARY ANGIOGRAM;  Surgeon: Kathleene Hazel, MD;  Location: [facility] CATH LAB;  Service: Cardiovascular;  Laterality: N/A;  . LEFT HEART CATH AND CORONARY ANGIOGRAPHY N/A 07/12/2016   Procedure: Left Heart Cath and Coronary Angiography;  Surgeon: Swaziland, Peter M, MD;  Location: [facility] INVASIVE CV LAB;  Service: Cardiovascular;  Laterality: N/A;  . LEFT HEART CATHETERIZATION WITH CORONARY ANGIOGRAM N/A 07/23/2013   Procedure: LEFT HEART CATHETERIZATION WITH CORONARY ANGIOGRAM;  Surgeon: Peter M Swaziland, MD;  Location: [facility] CATH LAB;  Service: Cardiovascular;  Laterality: N/A;       Inpatient Medications: Scheduled Meds: . ARIPiprazole  15 mg Oral Daily  . aspirin EC  81 mg Oral Daily  . clopidogrel  75 mg Oral Daily  . enoxaparin (LOVENOX) injection  40 mg Subcutaneous Q24H  . lisinopril  5  mg Oral Daily  . metoprolol succinate  12.5 mg Oral Daily  . nicotine  21 mg Transdermal Daily  . pantoprazole  40 mg Oral Daily  . rosuvastatin  40 mg Oral Daily   Continuous Infusions:  PRN Meds: acetaminophen, ALPRAZolam, hydrALAZINE, morphine injection, nitroGLYCERIN, ondansetron (ZOFRAN) IV, traZODone, zolpidem  Allergies:    Allergies  Allergen Reactions  . Iodine Anaphylaxis and Swelling  . Shellfish Allergy Anaphylaxis    All shellfish  . Contrast Media [Iodinated Diagnostic Agents] Nausea And Vomiting    Social History:   Social History   Socioeconomic History  . Marital status: Single    Spouse name: Not on file  . Number of children: Not on file  . Years of education: Not on file  . Highest education level: Not on file  Occupational History  . Not on file  Social Needs  . Financial resource strain: Not on file  . Food insecurity:     Worry: Not on file    Inability: Not on file  . Transportation needs:    Medical: Not on file    Non-medical: Not on file  Tobacco Use  . Smoking status: Current Every Day Smoker    Packs/day: 1.00    Years: 30.00    Pack years: 30.00    Types: Cigarettes  . Smokeless tobacco: Never Used  Substance and Sexual Activity  . Alcohol use: Yes    Alcohol/week: 0.0 standard drinks    Comment: 1 40 oz 2 times a week "cause my gf does"  . Drug use: Yes    Types: Marijuana    Comment: current user- daily one blunt   . Sexual activity: Not Currently  Lifestyle  . Physical activity:    Days per week: Not on file    Minutes per session: Not on file  . Stress: Not on file  Relationships  . Social connections:    Talks on phone: Not on file    Gets together: Not on file    Attends religious service: Not on file    Active member of club or organization: Not on file    Attends meetings of clubs or organizations: Not on file    Relationship status: Not on file  . Intimate partner violence:    Fear of current or ex partner: Not on file    Emotionally abused: Not on file    Physically abused: Not on file    Forced sexual activity: Not on file  Other Topics Concern  . Not on file  Social History Narrative  . Not on file    Family History:    Family History  Problem Relation Age of Onset  . CAD Mother   . Heart disease Mother   . Heart attack Mother   . Schizophrenia Mother   . CAD Sister      ROS:  Please see the history of present illness.   All other ROS reviewed and negative.     Physical Exam/Data:   Vitals:   11/03/17 0300 11/03/17 0330 11/03/17 0357 11/03/17 0800  BP: 103/63 111/69 121/80 123/85  Pulse: 61 (!) 50 (!) 58 (!) 49  Resp: 18 16 18 20   Temp:   97.8 F (36.6 C) 98 F (36.7 C)  TempSrc:   Oral Oral  SpO2: 98% 97% 100% 99%  Weight:   83 kg   Height:   6' (1.829 m)     Intake/Output Summary (Last 24 hours) at 11/03/2017 1001 Last  data filed at  11/03/2017 0700 Gross per 24 hour  Intake -  Output 500 ml  Net -500 ml   Filed Weights   11/03/17 0357  Weight: 83 kg   Body mass index is 24.82 kg/m.  General:  Well nourished, well developed, in no acute distress HEENT: normal Lymph: no adenopathy Neck: no JVD Endocrine:  No thryomegaly Vascular: No carotid bruits; FA pulses 2+ bilaterally without bruits  Cardiac:  normal S1, S2; RRR; no murmur  Lungs:  clear to auscultation bilaterally, no wheezing, rhonchi or rales  Abd: soft, nontender, no hepatomegaly  Ext: no edema Musculoskeletal:  No deformities, BUE and BLE strength normal and equal Skin: warm and dry  Neuro:  CNs 2-12 intact, no focal abnormalities noted Psych:  Normal affect   EKG:  The EKG was personally reviewed and demonstrates:  NSr, inferior TWis Telemetry:  Telemetry was personally reviewed and demonstrates:  none  Relevant CV Studies: Left Heart Cath and Coronary Angiography 07/2016  Conclusion     Prox RCA to Mid RCA lesion, 0 %stenosed.  Mid RCA to Dist RCA lesion, 0 %stenosed.  Ost RPDA to RPDA lesion, 30 %stenosed.  Ost 2nd Mrg to 2nd Mrg lesion, 30 %stenosed.  Ost 1st Mrg to 1st Mrg lesion, 25 %stenosed.  Ost LAD to Mid LAD lesion, 10 %stenosed.  Ost 1st Diag to 1st Diag lesion, 80 %stenosed.  Mid LAD lesion, 40 %stenosed.  There is mild to moderate left ventricular systolic dysfunction.  LV end diastolic pressure is normal.   1. Single vessel obstructive CAD involving a small diagonal branch. 2. Continue patency of stents in the RCA 3. Mild to moderate LV dysfunction with EF 45%. Mid inferior akinesis. 4. Normal LVEDP    Laboratory Data:  Chemistry Recent Labs  Lab 11/02/17 2301  NA 135  K 3.8  CL 106  CO2 23  GLUCOSE 124*  BUN 7  CREATININE 1.25*  CALCIUM 9.0  GFRNONAA >60  GFRAA >60  ANIONGAP 6    No results for input(s): PROT, ALBUMIN, AST, ALT, ALKPHOS, BILITOT in the last 168 hours. Hematology Recent Labs   Lab 11/02/17 2301  WBC 6.3  RBC 3.99*  HGB 11.6*  HCT 36.0*  MCV 90.2  MCH 29.1  MCHC 32.2  RDW 17.9*  PLT 264   Cardiac Enzymes Recent Labs  Lab 11/03/17 0322  TROPONINI <0.03    Recent Labs  Lab 11/02/17 2333 11/03/17 0310  TROPIPOC 0.00 0.00    BNPNo results for input(s): BNP, PROBNP in the last 168 hours.  DDimer No results for input(s): DDIMER in the last 168 hours.  Radiology/Studies:  Dg Chest 2 View  Result Date: 11/02/2017 CLINICAL DATA:  Chest pain. EXAM: CHEST - 2 VIEW COMPARISON:  Radiograph 02/22/2017 FINDINGS: The cardiomediastinal contours are normal. Coronary stent visualized. The lungs are clear. Pulmonary vasculature is normal. No consolidation, pleural effusion, or pneumothorax. No acute osseous abnormalities are seen. IMPRESSION: No acute findings. Electronically Signed   By: Narda Rutherford M.D.   On: 11/02/2017 23:52    Assessment and Plan:   RALPH BROUWER is a 42 y.o. male with a hx of CAD with multiple MIs and coronary stenting as outlined above, chronic systolic HF, aortic insuffiencey, HTN, tobacco use, HLD and bipolar disorder who is being seen today for the evaluation of CP at the request of Dr. Benjamine Mola, Internal Medicine.   1. Chest Pain/CAD: multiple MIs and coronary stenting as outlined above in HPI. His troponins  are negative x 3, but recent symptoms are similar to his symptoms in the past when he he was found to have obstructive disease. Given his underlying psych issues ? Reliability of his history and compliance with meds. We will plan to do NST today to assess for ischemia and will repeat echo. For now, continue medical therapy for CAD. Further recs to follow after stress test and echo results.   For questions or updates, please contact [facility] Please consult [email or web address] for contact info under     Signed, Robbie Lis, PA-C  11/03/2017 10:01 AM   As above, patient seen and examined.  Briefly he is a 42 year old male  with past medical history of coronary artery disease, hypertension, tobacco abuse, hyperlipidemia, bipolar disorder for evaluation of chest pain.  Patient has had previous PCI of his right coronary artery in the setting of ST segment elevation myocardial infarction.  Last catheterization July 2018 showed an 80% small first diagonal and otherwise nonobstructive coronary disease.  Mild to moderate LV dysfunction.  Medical therapy recommended.  Last echocardiogram February 2019 showed ejection fraction 45 to 50%.  It was interpreted as moderate aortic insufficiency but appears mild at my review.  Patient typically does not have dyspnea on exertion, orthopnea, PND, pedal edema, chest pain with exertion.  Yesterday the patient states he started shaking.  He states he noted this prior to his previous myocardial infarction.  There is no dyspnea or chest pain.  It lasted 45 minutes and resolve.  When he got onto the ambulance he then developed chest pressure without radiation or associated symptoms.  The pain lasted 5 to 10 minutes and resolved.  He was admitted and cardiology asked to evaluate.  Presently pain-free.  Enzymes are negative.  Electrocardiogram shows sinus rhythm with prior inferior infarct unchanged compared to previous.  Drug screen positive for marijuana.  1 chest pain-symptoms are atypical.  Enzymes are negative.  Electrocardiogram unchanged.  Plan stress nuclear study today for risk stratification.  If no ischemia patient can be discharged and follow-up with Dr. Tresa Endo.  2 tobacco abuse-patient counseled on discontinuing.  3 marijuana abuse-patient counseled on discontinuing.  4 coronary artery disease-patient has not had recent PCI.  Would continue aspirin 81 mg daily but discontinue Plavix.  Continue Crestor.  5 hyperlipidemia-continue Crestor.  Olga Millers, MD

## 2017-11-03 NOTE — Progress Notes (Signed)
meds ordered from pharm

## 2017-11-03 NOTE — Progress Notes (Signed)
   Junie Bame presented for a nuclear stress test today.  No immediate complications.  Stress imaging is pending at this time. Exercised 8:40 on Bruce protocol without any chest pain; did note DOE felt appropriate for level of exertion. Reached THR and maintained for 1 minute per protocol.  Preliminary EKG findings may be listed in the chart, but the stress test result will not be finalized until perfusion imaging is complete.  Laurann Montana, PA-C 11/03/2017, 12:51 PM

## 2018-02-11 ENCOUNTER — Emergency Department (HOSPITAL_COMMUNITY): Payer: Medicare Other

## 2018-02-11 ENCOUNTER — Encounter (HOSPITAL_COMMUNITY): Payer: Self-pay

## 2018-02-11 ENCOUNTER — Other Ambulatory Visit: Payer: Self-pay

## 2018-02-11 ENCOUNTER — Observation Stay (HOSPITAL_COMMUNITY)
Admission: EM | Admit: 2018-02-11 | Discharge: 2018-02-12 | Disposition: A | Discharge status: Home / Self Care | Payer: Medicare Other | Attending: Internal Medicine | Admitting: Internal Medicine

## 2018-02-11 DIAGNOSIS — Z9114 Patient's other noncompliance with medication regimen: Secondary | ICD-10-CM | POA: Diagnosis not present

## 2018-02-11 DIAGNOSIS — R001 Bradycardia, unspecified: Secondary | ICD-10-CM | POA: Insufficient documentation

## 2018-02-11 DIAGNOSIS — E871 Hypo-osmolality and hyponatremia: Secondary | ICD-10-CM | POA: Diagnosis present

## 2018-02-11 DIAGNOSIS — F209 Schizophrenia, unspecified: Secondary | ICD-10-CM | POA: Diagnosis not present

## 2018-02-11 DIAGNOSIS — R9431 Abnormal electrocardiogram [ECG] [EKG]: Secondary | ICD-10-CM | POA: Diagnosis present

## 2018-02-11 DIAGNOSIS — I255 Ischemic cardiomyopathy: Secondary | ICD-10-CM | POA: Diagnosis not present

## 2018-02-11 DIAGNOSIS — Z8249 Family history of ischemic heart disease and other diseases of the circulatory system: Secondary | ICD-10-CM | POA: Insufficient documentation

## 2018-02-11 DIAGNOSIS — F17210 Nicotine dependence, cigarettes, uncomplicated: Secondary | ICD-10-CM | POA: Diagnosis not present

## 2018-02-11 DIAGNOSIS — F1210 Cannabis abuse, uncomplicated: Secondary | ICD-10-CM | POA: Diagnosis not present

## 2018-02-11 DIAGNOSIS — I5042 Chronic combined systolic (congestive) and diastolic (congestive) heart failure: Secondary | ICD-10-CM | POA: Diagnosis not present

## 2018-02-11 DIAGNOSIS — Z79899 Other long term (current) drug therapy: Secondary | ICD-10-CM | POA: Insufficient documentation

## 2018-02-11 DIAGNOSIS — F319 Bipolar disorder, unspecified: Secondary | ICD-10-CM | POA: Insufficient documentation

## 2018-02-11 DIAGNOSIS — Z9861 Coronary angioplasty status: Secondary | ICD-10-CM

## 2018-02-11 DIAGNOSIS — N289 Disorder of kidney and ureter, unspecified: Secondary | ICD-10-CM

## 2018-02-11 DIAGNOSIS — R079 Chest pain, unspecified: Principal | ICD-10-CM | POA: Diagnosis present

## 2018-02-11 DIAGNOSIS — F419 Anxiety disorder, unspecified: Secondary | ICD-10-CM | POA: Insufficient documentation

## 2018-02-11 DIAGNOSIS — Z7982 Long term (current) use of aspirin: Secondary | ICD-10-CM | POA: Diagnosis not present

## 2018-02-11 DIAGNOSIS — F250 Schizoaffective disorder, bipolar type: Secondary | ICD-10-CM | POA: Diagnosis present

## 2018-02-11 DIAGNOSIS — Z7902 Long term (current) use of antithrombotics/antiplatelets: Secondary | ICD-10-CM | POA: Diagnosis not present

## 2018-02-11 DIAGNOSIS — I2510 Atherosclerotic heart disease of native coronary artery without angina pectoris: Secondary | ICD-10-CM

## 2018-02-11 DIAGNOSIS — Z955 Presence of coronary angioplasty implant and graft: Secondary | ICD-10-CM | POA: Diagnosis not present

## 2018-02-11 DIAGNOSIS — I110 Hypertensive heart disease with heart failure: Secondary | ICD-10-CM | POA: Diagnosis not present

## 2018-02-11 DIAGNOSIS — I5022 Chronic systolic (congestive) heart failure: Secondary | ICD-10-CM | POA: Diagnosis present

## 2018-02-11 DIAGNOSIS — E785 Hyperlipidemia, unspecified: Secondary | ICD-10-CM | POA: Diagnosis not present

## 2018-02-11 DIAGNOSIS — I252 Old myocardial infarction: Secondary | ICD-10-CM | POA: Diagnosis not present

## 2018-02-11 DIAGNOSIS — I4510 Unspecified right bundle-branch block: Secondary | ICD-10-CM | POA: Diagnosis not present

## 2018-02-11 LAB — CBC
HEMATOCRIT: 35.3 % — AB (ref 39.0–52.0)
Hemoglobin: 11.6 g/dL — ABNORMAL LOW (ref 13.0–17.0)
MCH: 29.9 pg (ref 26.0–34.0)
MCHC: 32.9 g/dL (ref 30.0–36.0)
MCV: 91 fL (ref 80.0–100.0)
Platelets: 228 10*3/uL (ref 150–400)
RBC: 3.88 MIL/uL — ABNORMAL LOW (ref 4.22–5.81)
RDW: 17.5 % — AB (ref 11.5–15.5)
WBC: 5.3 10*3/uL (ref 4.0–10.5)
nRBC: 0 % (ref 0.0–0.2)

## 2018-02-11 LAB — BASIC METABOLIC PANEL
Anion gap: 12 (ref 5–15)
BUN: 6 mg/dL (ref 6–20)
CO2: 19 mmol/L — ABNORMAL LOW (ref 22–32)
Calcium: 8.5 mg/dL — ABNORMAL LOW (ref 8.9–10.3)
Chloride: 102 mmol/L (ref 98–111)
Creatinine, Ser: 1.32 mg/dL — ABNORMAL HIGH (ref 0.61–1.24)
GFR calc Af Amer: >60 mL/min (ref >60)
GFR calc non Af Amer: >60 mL/min (ref >60)
Glucose, Bld: 112 mg/dL — ABNORMAL HIGH (ref 70–99)
Potassium: 3.5 mmol/L (ref 3.5–5.1)
Sodium: 133 mmol/L — ABNORMAL LOW (ref 135–145)

## 2018-02-11 LAB — I-STAT TROPONIN, ED: Troponin i, poc: 0.01 ng/mL (ref 0.00–0.08)

## 2018-02-11 MED ORDER — SODIUM CHLORIDE 0.9% FLUSH
3.0000 mL | Freq: Once | INTRAVENOUS | Status: AC
  Administered 2018-02-12: 3 mL via INTRAVENOUS

## 2018-02-11 NOTE — ED Notes (Signed)
Patient transported to X-ray 

## 2018-02-11 NOTE — ED Notes (Signed)
Nurse drawing labs. 

## 2018-02-11 NOTE — ED Triage Notes (Signed)
Onset 30 minutes prior to calling EMS, intermittant mid chest pain, dizziness.  NO  Pain or dizziness at this time.

## 2018-02-12 ENCOUNTER — Other Ambulatory Visit: Payer: Self-pay

## 2018-02-12 ENCOUNTER — Encounter (HOSPITAL_COMMUNITY): Payer: Self-pay | Admitting: Nurse Practitioner

## 2018-02-12 DIAGNOSIS — I252 Old myocardial infarction: Secondary | ICD-10-CM | POA: Diagnosis not present

## 2018-02-12 DIAGNOSIS — N289 Disorder of kidney and ureter, unspecified: Secondary | ICD-10-CM

## 2018-02-12 DIAGNOSIS — F250 Schizoaffective disorder, bipolar type: Secondary | ICD-10-CM

## 2018-02-12 DIAGNOSIS — I2510 Atherosclerotic heart disease of native coronary artery without angina pectoris: Secondary | ICD-10-CM | POA: Diagnosis not present

## 2018-02-12 DIAGNOSIS — Z9861 Coronary angioplasty status: Secondary | ICD-10-CM

## 2018-02-12 DIAGNOSIS — I5022 Chronic systolic (congestive) heart failure: Secondary | ICD-10-CM | POA: Diagnosis not present

## 2018-02-12 DIAGNOSIS — R079 Chest pain, unspecified: Secondary | ICD-10-CM | POA: Diagnosis not present

## 2018-02-12 DIAGNOSIS — F319 Bipolar disorder, unspecified: Secondary | ICD-10-CM | POA: Diagnosis not present

## 2018-02-12 DIAGNOSIS — E871 Hypo-osmolality and hyponatremia: Secondary | ICD-10-CM | POA: Diagnosis present

## 2018-02-12 LAB — BASIC METABOLIC PANEL
Anion gap: 9 (ref 5–15)
BUN: 6 mg/dL (ref 6–20)
CALCIUM: 8.8 mg/dL — AB (ref 8.9–10.3)
CO2: 21 mmol/L — ABNORMAL LOW (ref 22–32)
Chloride: 107 mmol/L (ref 98–111)
Creatinine, Ser: 1.1 mg/dL (ref 0.61–1.24)
GFR calc Af Amer: >60 mL/min (ref >60)
GFR calc non Af Amer: >60 mL/min (ref >60)
Glucose, Bld: 95 mg/dL (ref 70–99)
Potassium: 4 mmol/L (ref 3.5–5.1)
Sodium: 137 mmol/L (ref 135–145)

## 2018-02-12 LAB — RAPID URINE DRUG SCREEN, HOSP PERFORMED
Amphetamines: NOT DETECTED
BENZODIAZEPINES: NOT DETECTED
Barbiturates: NOT DETECTED
Cocaine: NOT DETECTED
Opiates: NOT DETECTED
Tetrahydrocannabinol: POSITIVE — AB

## 2018-02-12 LAB — TROPONIN I
Troponin I: <0.03 ng/mL (ref <0.03)
Troponin I: <0.03 ng/mL (ref <0.03)
Troponin I: <0.03 ng/mL (ref <0.03)

## 2018-02-12 LAB — CBC
HCT: 34.1 % — ABNORMAL LOW (ref 39.0–52.0)
Hemoglobin: 11.2 g/dL — ABNORMAL LOW (ref 13.0–17.0)
MCH: 29.1 pg (ref 26.0–34.0)
MCHC: 32.8 g/dL (ref 30.0–36.0)
MCV: 88.6 fL (ref 80.0–100.0)
PLATELETS: 221 10*3/uL (ref 150–400)
RBC: 3.85 MIL/uL — ABNORMAL LOW (ref 4.22–5.81)
RDW: 17.1 % — ABNORMAL HIGH (ref 11.5–15.5)
WBC: 4.5 10*3/uL (ref 4.0–10.5)
nRBC: 0 % (ref 0.0–0.2)

## 2018-02-12 MED ORDER — ASPIRIN EC 81 MG PO TBEC
81.0000 mg | DELAYED_RELEASE_TABLET | Freq: Every day | ORAL | Status: DC
  Administered 2018-02-12: 81 mg via ORAL
  Filled 2018-02-12: qty 1

## 2018-02-12 MED ORDER — ACETAMINOPHEN 325 MG PO TABS: 650.0000 mg | ORAL_TABLET | ORAL | Status: DC | PRN

## 2018-02-12 MED ORDER — ENOXAPARIN SODIUM 40 MG/0.4ML ~~LOC~~ SOLN
40.0000 mg | Freq: Every day | SUBCUTANEOUS | Status: DC
  Administered 2018-02-12: 40 mg via SUBCUTANEOUS
  Filled 2018-02-12: qty 0.4

## 2018-02-12 MED ORDER — LISINOPRIL 10 MG PO TABS
5.0000 mg | ORAL_TABLET | Freq: Every day | ORAL | Status: DC
  Administered 2018-02-12: 5 mg via ORAL
  Filled 2018-02-12: qty 1

## 2018-02-12 MED ORDER — TRAZODONE HCL 50 MG PO TABS: 100.0000 mg | ORAL_TABLET | Freq: Every evening | ORAL | Status: DC | PRN

## 2018-02-12 MED ORDER — ARIPIPRAZOLE 5 MG PO TABS
15.0000 mg | ORAL_TABLET | Freq: Every day | ORAL | Status: DC
  Administered 2018-02-12: 15 mg via ORAL
  Filled 2018-02-12: qty 1

## 2018-02-12 MED ORDER — METOPROLOL SUCCINATE ER 25 MG PO TB24
12.5000 mg | ORAL_TABLET | Freq: Every day | ORAL | Status: DC
  Administered 2018-02-12: 12.5 mg via ORAL
  Filled 2018-02-12: qty 1

## 2018-02-12 MED ORDER — ASPIRIN 81 MG PO CHEW
324.0000 mg | CHEWABLE_TABLET | ORAL | Status: DC
  Filled 2018-02-12: qty 4

## 2018-02-12 MED ORDER — ONDANSETRON HCL 4 MG/2ML IJ SOLN: 4.0000 mg | Freq: Four times a day (QID) | INTRAMUSCULAR | Status: DC | PRN

## 2018-02-12 MED ORDER — ASPIRIN 300 MG RE SUPP: 300.0000 mg | RECTAL | Status: DC

## 2018-02-12 MED ORDER — NITROGLYCERIN 0.4 MG SL SUBL: 0.4000 mg | SUBLINGUAL_TABLET | SUBLINGUAL | Status: DC | PRN

## 2018-02-12 MED ORDER — ROSUVASTATIN CALCIUM 20 MG PO TABS: 40.0000 mg | ORAL_TABLET | Freq: Every day | ORAL | Status: DC

## 2018-02-12 MED ORDER — NITROGLYCERIN 0.4 MG SL SUBL: 0.4000 mg | SUBLINGUAL_TABLET | SUBLINGUAL | 0 refills | Status: DC | PRN

## 2018-02-12 MED ORDER — CLOPIDOGREL BISULFATE 75 MG PO TABS
75.0000 mg | ORAL_TABLET | Freq: Every day | ORAL | Status: DC
  Administered 2018-02-12: 75 mg via ORAL
  Filled 2018-02-12: qty 1

## 2018-02-12 NOTE — Discharge Summary (Signed)
Physician Discharge Summary  Justin Bameyrone J Grumbine [id number] DOB: 10/02/1975 DOA: 02/11/2018  [facility]  Admit date: 02/11/2018 Discharge date: 02/12/2018  Admitted From: home Discharge disposition: home   Recommendations for Outpatient Follow-Up:   1. Tobacco cessation   Discharge Diagnosis:   Principal Problem:   Chest pain Active Problems:   CAD S/P BMS PCI to RCA with PTCA for ISR --> followed by stent thrombosis - DES PCI   Schizoaffective disorder, bipolar type (HCC)   Chronic systolic CHF (congestive heart failure) (HCC)   Mild renal insufficiency   Hyponatremia    Discharge Condition: Improved.  Diet recommendation: Low sodium, heart healthy.  Wound care: None.  Code status: Full.   History of Present Illness:   [name] is a 43 y.o. male with medical history significant for bipolar disorder, chronic systolic CHF, and coronary artery disease, now presenting to the emergency department after an episode of chest pain.  Patient reports that he was in his usual state of health, having an uneventful day, when he developed acute onset of vague chest pain while at rest.  He has difficulty describing the discomfort, notes that it radiated from the left chest towards the right chest, denies associated nausea or shortness of breath, reports that it was similar to an episode that he had in October which she believes was a panic attack, but different from his prior MI's during which the symptoms were more intense and he was experiencing radiation up into his neck as well as nausea.  The patient called EMS during the episode, states that it resolved spontaneously while he was with EMS, and has not returned.   Hospital Course by Problem:    Chest pain; - Presents after an episode of chest pain that resolved spontaneously prior to arrival -- anxiety suspected -CE negative   Bipolar disorder  - Stable  - Continue Abilify and trazodone    Chronic systolic CHF  - Appears compensated  - Continue ACE and beta-blocker    Mild renal insufficiency  - outpatient follow up    Medical Consultants:   cards   Discharge Exam:   Vitals:   02/12/18 0618 02/12/18 1003  BP: 102/74 103/70  Pulse: 78 (!) 54  Resp: 18   Temp: 97.8 F (36.6 C) 97.8 F (36.6 C)  SpO2: 97% 96%   Vitals:   02/12/18 0320 02/12/18 0331 02/12/18 0618 02/12/18 1003  BP:   102/74 103/70  Pulse:   78 (!) 54  Resp:   18   Temp:   97.8 F (36.6 C) 97.8 F (36.6 C)  TempSrc:   Oral Oral  SpO2:   97% 96%  Weight:  78.7 kg    Height: 6' (1.829 m)       General exam: Appears calm and comfortable.    The results of significant diagnostics from this hospitalization (including imaging, microbiology, ancillary and laboratory) are listed below for reference.     Procedures and Diagnostic Studies:   Dg Chest 2 View  Result Date: 02/11/2018 CLINICAL DATA:  Intermittent mid chest pain and dizziness. EXAM: CHEST - 2 VIEW COMPARISON:  11/02/2017 FINDINGS: The heart size and mediastinal contours are within normal limits. Both lungs are clear. The visualized skeletal structures are unremarkable. Coronary artery stent. IMPRESSION: No active cardiopulmonary disease. Electronically Signed   By: Burman NievesWilliam  Stevens M.D.   On: 02/11/2018 23:17     Labs:   Basic Metabolic Panel: Recent  Labs  Lab 02/11/18 2301 02/12/18 0804  NA 133* 137  K 3.5 4.0  CL 102 107  CO2 19* 21*  GLUCOSE 112* 95  BUN 6 6  CREATININE 1.32* 1.10  CALCIUM 8.5* 8.8*   GFR Estimated Creatinine Clearance: 98 mL/min (by C-G formula based on SCr of 1.1 mg/dL). Liver Function Tests: No results for input(s): AST, ALT, ALKPHOS, BILITOT, PROT, ALBUMIN in the last 168 hours. No results for input(s): LIPASE, AMYLASE in the last 168 hours. No results for input(s): AMMONIA in the last 168 hours. Coagulation profile No results for input(s): INR, PROTIME in the last 168  hours.  CBC: Recent Labs  Lab 02/11/18 2301 02/12/18 0804  WBC 5.3 4.5  HGB 11.6* 11.2*  HCT 35.3* 34.1*  MCV 91.0 88.6  PLT 228 221   Cardiac Enzymes: Recent Labs  Lab 02/12/18 0255 02/12/18 0804  TROPONINI <0.03 <0.03   BNP: Invalid input(s): POCBNP CBG: No results for input(s): GLUCAP in the last 168 hours. D-Dimer No results for input(s): DDIMER in the last 72 hours. Hgb A1c No results for input(s): HGBA1C in the last 72 hours. Lipid Profile No results for input(s): CHOL, HDL, LDLCALC, TRIG, CHOLHDL, LDLDIRECT in the last 72 hours. Thyroid function studies No results for input(s): TSH, T4TOTAL, T3FREE, THYROIDAB in the last 72 hours.  Invalid input(s): FREET3 Anemia work up No results for input(s): VITAMINB12, FOLATE, FERRITIN, TIBC, IRON, RETICCTPCT in the last 72 hours. Microbiology No results found for this or any previous visit (from the past 240 hour(s)).   Discharge Instructions:   Discharge Instructions    Diet - low sodium heart healthy   Complete by:  As directed    Discharge instructions   Complete by:  As directed    Be careful with nitro as it can lower your blood pressure Discuss at next cardiology visit if you should continue the plavix as during last hospitalization it was recommended to stop   Increase activity slowly   Complete by:  As directed      Allergies as of 02/12/2018      Reactions   Iodine Anaphylaxis, Swelling   Shellfish Allergy Anaphylaxis   All shellfish   Contrast Media [iodinated Diagnostic Agents] Nausea And Vomiting      Medication List    TAKE these medications   ARIPiprazole 15 MG tablet Commonly known as:  ABILIFY Take 1 tablet (15 mg total) by mouth daily.   aspirin EC 81 MG tablet Take 81 mg daily by mouth.   clopidogrel 75 MG tablet Commonly known as:  PLAVIX Take 75 mg by mouth daily.   lisinopril 5 MG tablet Commonly known as:  PRINIVIL,ZESTRIL Take 1 tablet (5 mg total) by mouth daily.    metoprolol succinate 25 MG 24 hr tablet Commonly known as:  TOPROL XL Take 0.5 tablets (12.5 mg total) by mouth daily.   nitroGLYCERIN 0.4 MG SL tablet Commonly known as:  NITROSTAT Place 1 tablet (0.4 mg total) under the tongue every 5 (five) minutes x 3 doses as needed for chest pain.   rosuvastatin 40 MG tablet Commonly known as:  CRESTOR Take 1 tablet (40 mg total) by mouth daily.   traZODone 100 MG tablet Commonly known as:  DESYREL Take 1 tablet (100 mg total) by mouth at bedtime as needed for sleep.         Time coordinating discharge: 25 min  Signed:  Joseph ArtJessica U Vann DO  [facility] 02/12/2018, 1:28 PM

## 2018-02-12 NOTE — ED Provider Notes (Signed)
[facility] Note   CSN: [id number] Arrival date & time: 02/11/18  2249     History   Chief Complaint Chief Complaint  Patient presents with  . Chest Pain    HPI [name] is a 43 y.o. male.  The history is provided by the patient.  Chest Pain  Pain location:  Substernal area Pain quality comment:  Squeezing Pain radiates to:  L arm Pain severity:  Moderate Onset quality:  Sudden Timing:  Constant Progression:  Resolved Chronicity:  Recurrent Context: at rest   Relieved by:  Nothing Worsened by:  Nothing Ineffective treatments:  None tried Associated symptoms: no back pain, no cough, no fever, no lower extremity edema, no nausea, no palpitations, no syncope and no vomiting   Risk factors: coronary artery disease and male sex     Past Medical History:  Diagnosis Date  . Anxiety   . Bipolar 1 disorder (HCC)   . CAD S/P percutaneous coronary angioplasty    a. Inf STEMI s/p BMS to RCA 01/2007. b. NSTEMI 07/2013 s/p PTCA to RCA for ISR; c. Transient inferior ST elevation (peak Ti 0.25) 01/2014 s/p PTCA/DES to pRCA, PTCA/DES to dRCA, EF 50%; d. 12/2014 Inf STEMI: RCA patent prox stent, [id number]/d (3.0x32 Synergy DES), EF 35-45; e. 05/2015 Inf STEMI/Cath: LM nl, LAD 10ost/m, D1 75, LCX nl, OM1 25, OM2 30, RCA patent stents, RPDA 30ost.  . Hx of medication noncompliance   . Hyperlipidemia   . Hypertension   . Hypertensive heart disease   . Ischemic cardiomyopathy    a. EF 40% in 2011, 60% in 2012. b. EF 55% by cath 07/2013. c. EF 50% by cath 01/2014; d. 12/2014 EF 35-45% by LV gram; e. 05/2015 Echo: EF 50-55% inflat/inf AK, mild AI; f. 06/2015 cMRI: EF 49%, basal & mid inf full thickness scar, subendocardial scar in antsept and antlat wall, correlating w/ D1 dzs.  . Myocardial infarction [facility](HCC)    pt report   . NSVT (nonsustained ventricular tachycardia) (HCC)    a. Very brief run during 07/2013 admit for NSTEMI felt due to MI.  .  Polysubstance abuse (HCC)    a. h/o tobacco, marijuana and crack cocaine use. b. 07/2013: +UDS THC, neg for cocaine.  . Schizophrenia (HCC)    pt report   . Tobacco abuse     Patient Active Problem List   Diagnosis Date Noted  . GERD (gastroesophageal reflux disease) 11/03/2017  . Chest pressure 11/03/2017  . Peritonitis (HCC) 02/20/2017  . Perforated gastric ulcer (HCC) 02/20/2017  . Bipolar I disorder, most recent episode mixed (HCC) 10/22/2016  . Unstable angina (HCC)   . Chronic systolic CHF (congestive heart failure) (HCC) 07/08/2016  . Left leg numbness   . Tetrahydrocannabinol (THC) use disorder, severe, dependence (HCC) 03/14/2016  . Malnutrition of moderate degree 09/28/2015  . Hyperlipidemia   . Schizoaffective disorder, bipolar type (HCC) 03/06/2015  . Nausea with vomiting 03/06/2015  . Hypertensive heart disease 01/11/2015  . ST elevation myocardial infarction (STEMI) of inferior wall (HCC) 01/10/2015  . ST elevation myocardial infarction (STEMI) of inferior wall, initial episode of care (HCC) 01/10/2015  . Medical non-compliance   . ST elevation myocardial infarction involving right coronary artery (HCC) 01/15/2014  . Hx of medication noncompliance   . NSVT (nonsustained ventricular tachycardia) (HCC)   . Ischemic cardiomyopathy   . Tobacco abuse   . Anxiety   . 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 DES PCI  to RCA x 2 (3.0 mm x 22 mm Resolute - distal & proximal RCA) 01/14/2014    Class: Present on Admission  . Non-STEMI (non-ST elevated myocardial infarction) (HCC) 07/22/2013  . Atypical chest pain 07/21/2013  . Bipolar 1 disorder, depressed (HCC) 07/21/2013  . HLD (hyperlipidemia) 04/16/2013  . Hyperlipidemia with target LDL less than 70 01/27/2009  . SUBSTANCE ABUSE 01/27/2009  . Depression 01/27/2009  . Essential hypertension 01/27/2009  . CAD S/P BMS PCI to RCA with PTCA for ISR --> followed by stent thrombosis - DES PCI 01/27/2009  . GASTRITIS  01/27/2009    Past Surgical History:  Procedure Laterality Date  . CARDIAC CATHETERIZATION  01/16/2009   normal left main, Cfx with 2-OMs both w/minor irregularities, LAd with 20-30% mid region irregularities, ramus intermediate/optional diagonal with 60% osital narrowing, RCA with stent in distal portion w/20% prox in-stent stenosis (Dr. Mervyn Skeeters. [place])  . CARDIAC CATHETERIZATION  07/23/2013   two vessel obstructive CAD, occluded first diagonal, focal in-stent restenosis in distal RCA (Dr. Peter Swaziland)  . CARDIAC CATHETERIZATION N/A 01/10/2015   Procedure: Left Heart Cath and Coronary Angiography;  Surgeon: Marykay Lex, MD;  Location: [facility] INVASIVE CV LAB;  Service: Cardiovascular;  Laterality: N/A;  . CARDIAC CATHETERIZATION N/A 01/10/2015   Procedure: Coronary Stent Intervention;  Surgeon: Marykay Lex, MD;  Location: [facility] INVASIVE CV LAB;  Service: Cardiovascular;  Laterality: N/A;  . CARDIAC CATHETERIZATION N/A 06/10/2015   Procedure: Left Heart Cath and Coronary Angiography;  Surgeon: Peter M Swaziland, MD;  Location: [facility] INVASIVE CV LAB;  Service: Cardiovascular;  Laterality: N/A;  . CARDIAC CATHETERIZATION N/A 09/27/2015   Procedure: Left Heart Cath and Coronary Angiography;  Surgeon: Corky Crafts, MD;  Location[facility] INVASIVE CV LAB;  Service: Cardiovascular;  Laterality: N/A;  . CARDIAC CATHETERIZATION N/A 09/27/2015   Procedure: Coronary Balloon Angioplasty;  Surgeon: Corky Crafts, MD;  Location: [facility] INVASIVE CV LAB;  Service: Cardiovascular;  Laterality: N/A;  . CORONARY ANGIOPLASTY WITH STENT PLACEMENT  02/05/2007   3.5x25mm Quantum non-DES to RCA (Dr. Nicki Guadalajara)  . FEMORAL ARTERY STENT    . LAPAROTOMY N/A 02/20/2017   Procedure: EXPLORATORY LAPAROTOMY AND REPAIR, PATCH, OR REMOVE PERFORATION IN BOWEL;  Surgeon: Emelia Loron, MD;  Location: [facility] OR;  Service: General;  Laterality: N/A;  . LEFT AND RIGHT HEART CATHETERIZATION WITH CORONARY ANGIOGRAM N/A 01/14/2014   Procedure:  LEFT AND RIGHT HEART CATHETERIZATION WITH CORONARY ANGIOGRAM;  Surgeon: Kathleene Hazel, MD;  Location: [facility] CATH LAB;  Service: Cardiovascular;  Laterality: N/A;  . LEFT HEART CATH AND CORONARY ANGIOGRAPHY N/A 07/12/2016   Procedure: Left Heart Cath and Coronary Angiography;  Surgeon: Swaziland, Peter M, MD;  Location: [facility] INVASIVE CV LAB;  Service: Cardiovascular;  Laterality: N/A;  . LEFT HEART CATHETERIZATION WITH CORONARY ANGIOGRAM N/A 07/23/2013   Procedure: LEFT HEART CATHETERIZATION WITH CORONARY ANGIOGRAM;  Surgeon: Peter M Swaziland, MD;  Location: [facility] CATH LAB;  Service: Cardiovascular;  Laterality: N/A;        Home Medications    Prior to Admission medications   Medication Sig Start Date End Date Taking? Authorizing Provider  ARIPiprazole (ABILIFY) 15 MG tablet Take 1 tablet (15 mg total) by mouth daily. 02/28/17   Meuth, Lina Sar, PA-C  aspirin EC 81 MG tablet Take 81 mg daily by mouth.    [provider]  lisinopril (PRINIVIL,ZESTRIL) 5 MG tablet Take 1 tablet (5 mg total) by mouth daily. 03/11/17   Lennette Bihari, MD  metoprolol succinate (TOPROL XL) 25 MG 24 hr tablet Take 0.5 tablets (12.5 mg total) by mouth daily. 03/11/17   Lennette Bihari, MD  nicotine (NICODERM CQ - DOSED IN MG/24 HOURS) 21 mg/24hr patch Place 21 mg onto the skin daily.    [provider]  nicotine polacrilex (COMMIT) 4 MG lozenge Take 4 mg by mouth daily as needed for smoking cessation.    [provider]  nicotine polacrilex (NICORETTE) 2 MG gum Take 1 each (2 mg total) by mouth as needed for smoking cessation. (May buy from over the counter): Smoking cessation Patient not taking: Reported on 11/03/2017 10/25/16   Armandina Stammer I, NP  nitroGLYCERIN (NITROSTAT) 0.4 MG SL tablet Place 1 tablet (0.4 mg total) under the tongue every 5 (five) minutes x 3 doses as needed for chest pain. 10/25/16   Armandina Stammer I, NP  rosuvastatin (CRESTOR) 40 MG tablet Take 1 tablet (40 mg total) by mouth  daily. 03/11/17   Lennette Bihari, MD  traZODone (DESYREL) 100 MG tablet Take 1 tablet (100 mg total) by mouth at bedtime as needed for sleep. 02/28/17   Meuth, Lina Sar, PA-C    Family History Family History  Problem Relation Age of Onset  . CAD Mother   . Heart disease Mother   . Heart attack Mother   . Schizophrenia Mother   . CAD Sister     Social History Social History   Tobacco Use  . Smoking status: Current Every Day Smoker    Packs/day: 1.00    Years: 30.00    Pack years: 30.00    Types: Cigarettes  . Smokeless tobacco: Never Used  Substance Use Topics  . Alcohol use: Yes    Alcohol/week: 0.0 standard drinks    Comment: 1 40 oz 2 times a week "cause my gf does"  . Drug use: Yes    Types: Marijuana    Comment: current user- daily one blunt      Allergies   Iodine; Shellfish allergy; and Contrast media [iodinated diagnostic agents]   Review of Systems Review of Systems  Constitutional: Negative for fever.  Respiratory: Negative for cough.   Cardiovascular: Positive for chest pain. Negative for palpitations, leg swelling and syncope.  Gastrointestinal: Negative for nausea and vomiting.  Musculoskeletal: Negative for back pain.     Physical Exam Updated Vital Signs BP 109/81   Pulse 70   Temp 97.9 F (36.6 C) (Oral)   Resp 18   SpO2 97%   Physical Exam Vitals signs and nursing note reviewed.  Constitutional:      General: He is not in acute distress.    Appearance: He is normal weight.  HENT:     Head: Normocephalic and atraumatic.     Nose: Nose normal.     Mouth/Throat:     Mouth: Mucous membranes are moist.     Pharynx: Oropharynx is clear.  Eyes:     Conjunctiva/sclera: Conjunctivae normal.     Pupils: Pupils are equal, round, and reactive to light.  Neck:     Musculoskeletal: Normal range of motion and neck supple.  Cardiovascular:     Rate and Rhythm: Normal rate and regular rhythm.     Pulses: Normal pulses.     Heart sounds: Normal  heart sounds.  Pulmonary:     Effort: Pulmonary effort is normal. No respiratory distress.     Breath sounds: Normal breath sounds. No wheezing or rhonchi.  Abdominal:  General: Abdomen is flat. Bowel sounds are normal.     Tenderness: There is no abdominal tenderness. There is no guarding.  Musculoskeletal: Normal range of motion.     Right lower leg: No edema.     Left lower leg: No edema.  Skin:    General: Skin is warm and dry.     Capillary Refill: Capillary refill takes less than 2 seconds.  Neurological:     General: No focal deficit present.     Mental Status: He is alert and oriented to person, place, and time.  Psychiatric:        Mood and Affect: Mood normal.        Behavior: Behavior normal.      ED Treatments / Results  Labs (all labs ordered are listed, but only abnormal results are displayed) Results for orders placed or performed during the hospital encounter of 02/11/18  Basic metabolic panel  Result Value Ref Range   Sodium 133 (L) 135 - 145 mmol/L   Potassium 3.5 3.5 - 5.1 mmol/L   Chloride 102 98 - 111 mmol/L   CO2 19 (L) 22 - 32 mmol/L   Glucose, Bld 112 (H) 70 - 99 mg/dL   BUN 6 6 - 20 mg/dL   Creatinine, Ser 1.61 (H) 0.61 - 1.24 mg/dL   Calcium 8.5 (L) 8.9 - 10.3 mg/dL   GFR calc non Af Amer >60 >60 mL/min   GFR calc Af Amer >60 >60 mL/min   Anion gap 12 5 - 15  CBC  Result Value Ref Range   WBC 5.3 4.0 - 10.5 K/uL   RBC 3.88 (L) 4.22 - 5.81 MIL/uL   Hemoglobin 11.6 (L) 13.0 - 17.0 g/dL   HCT 09.6 (L) 04.5 - 40.9 %   MCV 91.0 80.0 - 100.0 fL   MCH 29.9 26.0 - 34.0 pg   MCHC 32.9 30.0 - 36.0 g/dL   RDW 81.1 (H) 91.4 - 78.2 %   Platelets 228 150 - 400 K/uL   nRBC 0.0 0.0 - 0.2 %  I-stat troponin, ED  Result Value Ref Range   Troponin i, poc 0.01 0.00 - 0.08 ng/mL   Comment 3           Dg Chest 2 View  Result Date: 02/11/2018 CLINICAL DATA:  Intermittent mid chest pain and dizziness. EXAM: CHEST - 2 VIEW COMPARISON:  11/02/2017  FINDINGS: The heart size and mediastinal contours are within normal limits. Both lungs are clear. The visualized skeletal structures are unremarkable. Coronary artery stent. IMPRESSION: No active cardiopulmonary disease. Electronically Signed   By: Burman Nieves M.D.   On: 02/11/2018 23:17    EKG EKG Interpretation  Date/Time:  Saturday February 11 2018 22:46:24 EST Ventricular Rate:  63 PR Interval:  142 QRS Duration: 116 QT Interval:  438 QTC Calculation: 448 R Axis:   24 Text Interpretation:  Normal sinus rhythm Incomplete right bundle branch block Inferior infarct , age undetermined Abnormal ECG Confirmed by Kennis Carina [phone]) on 02/11/2018 10:51:32 PM   Radiology Dg Chest 2 View  Result Date: 02/11/2018 CLINICAL DATA:  Intermittent mid chest pain and dizziness. EXAM: CHEST - 2 VIEW COMPARISON:  11/02/2017 FINDINGS: The heart size and mediastinal contours are within normal limits. Both lungs are clear. The visualized skeletal structures are unremarkable. Coronary artery stent. IMPRESSION: No active cardiopulmonary disease. Electronically Signed   By: Burman Nieves M.D.   On: 02/11/2018 23:17    Procedures Procedures (including critical care time)  Medications Ordered in ED Medications  sodium chloride flush (NS) 0.9 % injection 3 mL (3 mLs Intravenous Given by Other 02/12/18 0010)     Case d/w cardiology fellow admit to medicine to be seen by cards in am   HEART score is 6   Final Clinical Impressions(s) / ED Diagnoses   Will admit for rule out    Yishai Rehfeld, MD 02/12/18 0111

## 2018-02-12 NOTE — Consult Note (Addendum)
Cardiology Consult    Patient ID: Justin Bameyrone J Mcenaney MRN: [id number], DOB/AGE: 41-Mar-1977   Admit date: 02/11/2018 Date of Consult: 02/12/2018  Primary Physician: Care, Jovita KussmaulEvans [facility] Primary Cardiologist: Nicki Guadalajarahomas Kelly, MD Requesting Provider: Verner MouldJ. Vann, DO  Patient Profile    [name] is a 43 y.o. male with a history of CAD s/p multiple RCA PCI's and MI's, HTN, HL, polysubstance abuse, ICM, and schizophrenia, who is being seen today for the evaluation of chest pain at the request of Dr. Benjamine MolaVann.  Past Medical History   Past Medical History:  Diagnosis Date  . Anxiety   . Bipolar 1 disorder (HCC)   . CAD S/P percutaneous coronary angioplasty    a. 01/2007 Inf STEMI: BMS-> RCA; b. 07/2013 NSTEMI: PTCA->RCA for ISR; c. 01/2014: Inf STEMI->PTCA/DES to pRCA &dRCA; d. 12/2014 Inf STEMI: RCA patent prox stent, 15217m/d (3.0x32 Synergy DES); e. 05/2015 Inf STEMI: D1 75, RCA stents ok->Med rx; f. 09/2015 PCI: RCA 15417m/d (thrombectomy); g. 07/2016 Cath: LM nl, LAD 40, D1 80, LCX ok, OM1 25, OM2 30, RCA stents ok, RPDA 30; h. 10/2017 MV: No isch.  Marland Kitchen. Hx of medication noncompliance   . Hyperlipidemia   . Hypertension   . Hypertensive heart disease   . Ischemic cardiomyopathy    a. EF 40% in 2011, 60% in 2012. b. EF 55% by cath 07/2013. c. EF 50% by cath 01/2014; d. 12/2014 EF 35-45% by LV gram; e. 05/2015 Echo: EF 50-55% inflat/inf AKI; f. 06/2015 cMRI: EF 49%, basal & mid inf full thickness scar, subendocardial scar in antsept and antlat wall, correlating w/ D1 dzs; g. 02/2017 Echo: EF 45-50%, diff HK. Antsep/infsept AK. Mod AI, triv MR.  Marland Kitchen. NSVT (nonsustained ventricular tachycardia) (HCC)    a. Very brief run during 07/2013 admit for NSTEMI felt due to MI.  . Polysubstance abuse (HCC)    a. h/o tobacco, marijuana and crack cocaine use. b. 07/2013: +UDS THC, neg for cocaine.  . Schizophrenia (HCC)    pt report   . Tobacco abuse     Past Surgical History:  Procedure Laterality Date  . CARDIAC  CATHETERIZATION  01/16/2009   normal left main, Cfx with 2-OMs both w/minor irregularities, LAd with 20-30% mid region irregularities, ramus intermediate/optional diagonal with 60% osital narrowing, RCA with stent in distal portion w/20% prox in-stent stenosis (Dr. Mervyn SkeetersA. Little)  . CARDIAC CATHETERIZATION  07/23/2013   two vessel obstructive CAD, occluded first diagonal, focal in-stent restenosis in distal RCA (Dr. Peter SwazilandJordan)  . CARDIAC CATHETERIZATION N/A 01/10/2015   Procedure: Left Heart Cath and Coronary Angiography;  Surgeon: Marykay Lexavid W Harding, MD;  [facility];  Service: Cardiovascular;  Laterality: N/A;  . CARDIAC CATHETERIZATION N/A 01/10/2015   Procedure: Coronary Stent Intervention;  Surgeon: Marykay Lexavid W Harding, MD;  [facility];  Service: Cardiovascular;  Laterality: N/A;  . CARDIAC CATHETERIZATION N/A 06/10/2015   Procedure: Left Heart Cath and Coronary Angiography;  Surgeon: Peter M SwazilandJordan, MD;  [facility];  Service: Cardiovascular;  Laterality: N/A;  . CARDIAC CATHETERIZATION N/A 09/27/2015   Procedure: Left Heart Cath and Coronary Angiography;  Surgeon: Corky CraftsJayadeep S Varanasi, MD;  [facility];  Service: Cardiovascular;  Laterality: N/A;  . CARDIAC CATHETERIZATION N/A 09/27/2015   Procedure: Coronary Balloon Angioplasty;  Surgeon: Corky CraftsJayadeep S Varanasi, MD;  Location: [facility] CV LAB;  Service: Cardiovascular;  Laterality: N/A;  . CORONARY ANGIOPLASTY WITH STENT PLACEMENT  02/05/2007   3.5x84mm Quantum non-DES to RCA (Dr. Nicki Guadalajara)  . FEMORAL ARTERY STENT    . LAPAROTOMY N/A 02/20/2017   Procedure: EXPLORATORY LAPAROTOMY AND REPAIR, PATCH, OR REMOVE PERFORATION IN BOWEL;  Surgeon: Emelia Loron, MD;  Location: [facility] OR;  Service: General;  Laterality: N/A;  . LEFT AND RIGHT HEART CATHETERIZATION WITH CORONARY ANGIOGRAM N/A 01/14/2014   Procedure: LEFT AND RIGHT HEART CATHETERIZATION WITH CORONARY ANGIOGRAM;  Surgeon:  Kathleene Hazel, MD;  Location: [facility] CATH LAB;  Service: Cardiovascular;  Laterality: N/A;  . LEFT HEART CATH AND CORONARY ANGIOGRAPHY N/A 07/12/2016   Procedure: Left Heart Cath and Coronary Angiography;  Surgeon: Swaziland, Peter M, MD;  Location: [facility] INVASIVE CV LAB;  Service: Cardiovascular;  Laterality: N/A;  . LEFT HEART CATHETERIZATION WITH CORONARY ANGIOGRAM N/A 07/23/2013   Procedure: LEFT HEART CATHETERIZATION WITH CORONARY ANGIOGRAM;  Surgeon: Peter M Swaziland, MD;  Location: [facility] CATH LAB;  Service: Cardiovascular;  Laterality: N/A;     Allergies  Allergies  Allergen Reactions  . Iodine Anaphylaxis and Swelling  . Shellfish Allergy Anaphylaxis    All shellfish  . Contrast Media [Iodinated Diagnostic Agents] Nausea And Vomiting    History of Present Illness    43 year old male with above complex past medical history including coronary artery disease status post multiple myocardial infarctions with RCA interventions, hypertension, hyperlipidemia, tobacco abuse, marijuana abuse, alcohol abuse, ischemic cardiomyopathy, and schizophrenia.  Cardiac history dates back to 2009, when he suffered an inferior STEMI and underwent bare-metal stenting of the RCA.  He required subsequent interventions in the setting of frequent noncompliance in July 2015, January 2016, December 2016, in September 2017.  All interventions were performed in the RCA.  His last diagnostic catheterization July 2018 showed an 80% stenosis in a diagonal branch, which was stable, patent RCA stents, and otherwise nonobstructive disease.  He was medically managed.  In October 2019, he was admitted with diffuse body shaking and mild chest pain.  He ruled out and underwent stress testing, which showed large prior inferior infarct, inferior akinesis, EF of 38%, and no ischemia.  He was medically managed.  Since hospitalization October, patient says he has been doing well.  He reports compliance with medications and does not typically  experience chest discomfort.  He says he did not follow-up in our office due to some financial constraints.  He continues to smoke a pack of cigarettes a day, smoke 1-2 blunts/marijuana a day, and drinks a 40 of beer every 2 days.  He was in his usual state of health until February 1, when he had sudden onset of shaking of his upper extremities with left arm numbness followed by shaking of his lower extremities.  This was very similar to what he experienced in October 2019.  He took a baby aspirin and called EMS.  Within 15 minutes, symptoms resolved, and then EMS arrived.  He says he was asymptomatic at their arrival.  He was taken to the ED where ECG was nonacute, troponins and chest x-ray normal.  He was placed in observation and has since ruled out.  He has had no recurrent symptoms or arrhythmias on telemetry.  He is eager for discharge.  Inpatient Medications    . ARIPiprazole  15 mg Oral Daily  . aspirin  324 mg Oral NOW   Or  . aspirin  300 mg Rectal NOW  . aspirin EC  81 mg Oral Daily  . clopidogrel  75 mg Oral Daily  . enoxaparin (LOVENOX)  injection  40 mg Subcutaneous Daily  . lisinopril  5 mg Oral Daily  . metoprolol succinate  12.5 mg Oral Daily  . rosuvastatin  40 mg Oral q1800    Family History    Family History  Problem Relation Age of Onset  . CAD Mother   . Heart disease Mother   . Heart attack Mother   . Schizophrenia Mother   . CAD Sister    He indicated that his mother is deceased. He indicated that his father is alive. He indicated that his sister is alive.   Social History    Social History   Socioeconomic History  . Marital status: Single    Spouse name: Not on file  . Number of children: Not on file  . Years of education: Not on file  . Highest education level: Not on file  Occupational History  . Not on file  Social Needs  . Financial resource strain: Not on file  . Food insecurity:    Worry: Not on file    Inability: Not on file  .  Transportation needs:    Medical: Not on file    Non-medical: Not on file  Tobacco Use  . Smoking status: Current Every Day Smoker    Packs/day: 1.00    Years: 30.00    Pack years: 30.00    Types: Cigarettes  . Smokeless tobacco: Never Used  Substance and Sexual Activity  . Alcohol use: Yes    Alcohol/week: 0.0 standard drinks    Comment: 40 oz  . Drug use: Yes    Types: Marijuana    Comment: current user- daily one blunt   . Sexual activity: Not Currently  Lifestyle  . Physical activity:    Days per week: Not on file    Minutes per session: Not on file  . Stress: Not on file  Relationships  . Social connections:    Talks on phone: Not on file    Gets together: Not on file    Attends religious service: Not on file    Active member of club or organization: Not on file    Attends meetings of clubs or organizations: Not on file    Relationship status: Not on file  . Intimate partner violence:    Fear of current or ex partner: Not on file    Emotionally abused: Not on file    Physically abused: Not on file    Forced sexual activity: Not on file  Other Topics Concern  . Not on file  Social History Narrative  . Not on file     Review of Systems    General:  No chills, fever, night sweats or weight changes.  Cardiovascular:  No chest pain, dyspnea on exertion, edema, orthopnea, palpitations, paroxysmal nocturnal dyspnea. Dermatological: No rash, lesions/masses Respiratory: No cough, dyspnea Urologic: No hematuria, dysuria Abdominal:   No nausea, vomiting, diarrhea, bright red blood per rectum, melena, or hematemesis Neurologic:  No visual changes, wkns, changes in mental status.  Upper and lower body shakes/jitters with left arm numbness as outlined above - 15 mins and then resolved. No recurrence. All other systems reviewed and are otherwise negative except as noted above.  Physical Exam    Blood pressure 103/70, pulse (!) 54, temperature 97.8 F (36.6 C),  temperature source Oral, resp. rate 18, height 6' (1.829 m), weight 78.7 kg, SpO2 96 %.  General: Pleasant, NAD Psych: Normal affect. Neuro: Alert and oriented X 3. Moves all extremities spontaneously.  HEENT: Normal  Neck: Supple without bruits or JVD. Lungs:  Resp regular and unlabored, CTA. Heart: RRR no s3, s4, or murmurs. Abdomen: Soft, non-tender, non-distended, BS + x 4.  Extremities: No clubbing, cyanosis or edema. DP/PT/Radials 2+ and equal bilaterally.  Labs    Troponin [facility] of Care Test) Recent Labs    02/11/18 2310  TROPIPOC 0.01   Recent Labs    02/12/18 0255 02/12/18 0804  TROPONINI <0.03 <0.03   Lab Results  Component Value Date   WBC 4.5 02/12/2018   HGB 11.2 (L) 02/12/2018   HCT 34.1 (L) 02/12/2018   MCV 88.6 02/12/2018   PLT 221 02/12/2018    Recent Labs  Lab 02/12/18 0804  NA 137  K 4.0  CL 107  CO2 21*  BUN 6  CREATININE 1.10  CALCIUM 8.8*  GLUCOSE 95   Lab Results  Component Value Date   CHOL 169 11/03/2017   HDL 40 (L) 11/03/2017   LDLCALC 113 (H) 11/03/2017   TRIG 78 11/03/2017     Radiology Studies    Dg Chest 2 View  Result Date: 02/11/2018 CLINICAL DATA:  Intermittent mid chest pain and dizziness. EXAM: CHEST - 2 VIEW COMPARISON:  11/02/2017 FINDINGS: The heart size and mediastinal contours are within normal limits. Both lungs are clear. The visualized skeletal structures are unremarkable. Coronary artery stent. IMPRESSION: No active cardiopulmonary disease. Electronically Signed   By: Burman Nieves M.D.   On: 02/11/2018 23:17    ECG & Cardiac Imaging    SB, 53, inf infarct w/ TWI, inc RBBB, no acute changes - personally reviewed.  Assessment & Plan    1.  CAD: Patient with prior history of coronary artery disease status post multiple right coronary artery interventions, the last of which in September 2017.  Diagnostic catheterization in July 2018 showed patency of RCA stents, with significant diagonal (small vessel)  disease which was stable from prior catheterizations.  He otherwise had nonobstructive disease.  Myoview stress testing undertaken in the setting of jitteriness and shakes in October 2019 was nonischemic.  Patient had recurrent jitteriness of his upper and lower extremities with left arm numbness yesterday.  This lasted 15 minutes and resolved spontaneously, prior to EMS arrival.  He says he did not have any chest pain during this episode.  Of note, prior anginal equivalent was substernal chest heaviness associated with dyspnea.  He denies experiencing anything like that since prior to his catheterization in 2018.  Here, ECG is without acute ischemic changes and troponins have remained normal.  He is chest pain-free. He is eager to go home, and discharge is reasonable in the absence of objective evidence of ischemia.  I have counseled the remain compliant with his medications and I have reached out to our office to get him a early follow-up appointment.  Continue aspirin, Plavix, beta-blocker, ACE inhibitor, and statin therapy.  2.  Essential hypertension: Stable.  Continue current regimen.  3.  Ischemic cardiomyopathy/chronic systolic/diastolic congestive heart failure: Euvolemic on examination.  Continue beta-blocker and ACE inhibitor therapy.  4.  Polysubstance abuse: Patient smoking a pack of cigarettes a day along with 1-2 marijuana cigarettes, and drinking a 40 ounce of beer every 2 days.  We discussed the importance of complete cessation of all substances.  He as grateful for the conversation.  5.  HL:  Cont statin rx.  LDL not @ goal in October - likely 2/2 noncompliance.  Signed, Nicolasa Ducking, NP 02/12/2018, 1:18 PM  For questions  or updates, please contact   Please consult [email or web address] for contact info under Cardiology/STEMI.  I have seen and examined this patient with Ward Givens.  Agree with above, note added to reflect my findings.  On exam, RRR, no murmurs, lungs clear.  Patient  admitted to the hospital with chest pain.  When further discussion with the patient, he feels that his chest pain was all due to anxiety.  At this point he is chest pain-free.  Troponins have remained negative.  We Analisse Randle plan for discharge home with follow-up in cardiology clinic.  Conner Muegge M. Chaley Castellanos MD 02/12/2018 1:43 PM

## 2018-02-12 NOTE — ED Notes (Signed)
Pt reported a cramping type chest pain that radiated from the left chest to the right chest. Pt reports the pain went away as the paramedics were talking to him. Pt thinks he might of had a panic attack.

## 2018-02-12 NOTE — H&P (Signed)
History and Physical    [name] ZOX:[id number] DOB: July 13, 1975 DOA: 02/11/2018  [facility]   Patient coming from: Home   Chief Complaint: Chest pain   HPI: [name] is a 43 y.o. male with medical history significant for bipolar disorder, chronic systolic CHF, and coronary artery disease, now presenting to the emergency department after an episode of chest pain.  Patient reports that he was in his usual state of health, having an uneventful day, when he developed acute onset of vague chest pain while at rest.  He has difficulty describing the discomfort, notes that it radiated from the left chest towards the right chest, denies associated nausea or shortness of breath, reports that it was similar to an episode that he had in October which she believes was a panic attack, but different from his prior MI's during which the symptoms were more intense and he was experiencing radiation up into his neck as well as nausea.  The patient called EMS during the episode, states that it resolved spontaneously while he was with EMS, and has not returned.  ED Course: Upon arrival to the ED, patient is found to be afebrile, saturating well on room air, bradycardic in the 50s, and with stable blood pressure.  EKG features a sinus rhythm with incomplete RBBB.  Chest x-ray is negative for acute cardiopulmonary disease.  Chemistry panel is notable for mild hyponatremia, bicarbonate of 19, and creatinine 1.32, similar to priors.  CBC features a slight normocytic anemia that is stable.  Initial troponin is normal at 0.01.  Cardiology was consulted by the ED physician and recommended medical admission.  Review of Systems:  All other systems reviewed and apart from HPI, are negative.  Past Medical History:  Diagnosis Date  . Anxiety   . Bipolar 1 disorder (HCC)   . CAD S/P percutaneous coronary angioplasty    a. Inf STEMI s/p BMS to RCA 01/2007. b. NSTEMI 07/2013 s/p PTCA to RCA  for ISR; c. Transient inferior ST elevation (peak Ti 0.25) 01/2014 s/p PTCA/DES to pRCA, PTCA/DES to dRCA, EF 50%; d. 12/2014 Inf STEMI: RCA patent prox stent, [id number]/d (3.0x32 Synergy DES), EF 35-45; e. 05/2015 Inf STEMI/Cath: LM nl, LAD 10ost/m, D1 75, LCX nl, OM1 25, OM2 30, RCA patent stents, RPDA 30ost.  . Hx of medication noncompliance   . Hyperlipidemia   . Hypertension   . Hypertensive heart disease   . Ischemic cardiomyopathy    a. EF 40% in 2011, 60% in 2012. b. EF 55% by cath 07/2013. c. EF 50% by cath 01/2014; d. 12/2014 EF 35-45% by LV gram; e. 05/2015 Echo: EF 50-55% inflat/inf AK, mild AI; f. 06/2015 cMRI: EF 49%, basal & mid inf full thickness scar, subendocardial scar in antsept and antlat wall, correlating w/ D1 dzs.  . Myocardial infarction [facility](HCC)    pt report   . NSVT (nonsustained ventricular tachycardia) (HCC)    a. Very brief run during 07/2013 admit for NSTEMI felt due to MI.  . Polysubstance abuse (HCC)    a. h/o tobacco, marijuana and crack cocaine use. b. 07/2013: +UDS THC, neg for cocaine.  . Schizophrenia (HCC)    pt report   . Tobacco abuse     Past Surgical History:  Procedure Laterality Date  . CARDIAC CATHETERIZATION  01/16/2009   normal left main, Cfx with 2-OMs both w/minor irregularities, LAd with 20-30% mid region irregularities, ramus intermediate/optional diagonal with 60% osital narrowing, RCA with stent  in distal portion w/20% prox in-stent stenosis (Dr. Mervyn Skeeters. [place])  . CARDIAC CATHETERIZATION  07/23/2013   two vessel obstructive CAD, occluded first diagonal, focal in-stent restenosis in distal RCA (Dr. Peter Swaziland)  . CARDIAC CATHETERIZATION N/A 01/10/2015   Procedure: Left Heart Cath and Coronary Angiography;  Surgeon: Marykay Lex, MD;  Location: [facility] INVASIVE CV LAB;  Service: Cardiovascular;  Laterality: N/A;  . CARDIAC CATHETERIZATION N/A 01/10/2015   Procedure: Coronary Stent Intervention;  Surgeon: Marykay Lex, MD;  Location: [facility] INVASIVE CV LAB;   Service: Cardiovascular;  Laterality: N/A;  . CARDIAC CATHETERIZATION N/A 06/10/2015   Procedure: Left Heart Cath and Coronary Angiography;  Surgeon: Peter M Swaziland, MD;  Location: [facility] INVASIVE CV LAB;  Service: Cardiovascular;  Laterality: N/A;  . CARDIAC CATHETERIZATION N/A 09/27/2015   Procedure: Left Heart Cath and Coronary Angiography;  Surgeon: Corky Crafts, MD;  Location: [facility] INVASIVE CV LAB;  Service: Cardiovascular;  Laterality: N/A;  . CARDIAC CATHETERIZATION N/A 09/27/2015   Procedure: Coronary Balloon Angioplasty;  Surgeon: Corky Crafts, MD;  Location: [facility] INVASIVE CV LAB;  Service: Cardiovascular;  Laterality: N/A;  . CORONARY ANGIOPLASTY WITH STENT PLACEMENT  02/05/2007   3.5x71mm Quantum non-DES to RCA (Dr. Nicki Guadalajara)  . FEMORAL ARTERY STENT    . LAPAROTOMY N/A 02/20/2017   Procedure: EXPLORATORY LAPAROTOMY AND REPAIR, PATCH, OR REMOVE PERFORATION IN BOWEL;  Surgeon: Emelia Loron, MD;  Location: [facility] OR;  Service: General;  Laterality: N/A;  . LEFT AND RIGHT HEART CATHETERIZATION WITH CORONARY ANGIOGRAM N/A 01/14/2014   Procedure: LEFT AND RIGHT HEART CATHETERIZATION WITH CORONARY ANGIOGRAM;  Surgeon: Kathleene Hazel, MD;  Location: [facility] CATH LAB;  Service: Cardiovascular;  Laterality: N/A;  . LEFT HEART CATH AND CORONARY ANGIOGRAPHY N/A 07/12/2016   Procedure: Left Heart Cath and Coronary Angiography;  Surgeon: Swaziland, Peter M, MD;  Location: [facility] INVASIVE CV LAB;  Service: Cardiovascular;  Laterality: N/A;  . LEFT HEART CATHETERIZATION WITH CORONARY ANGIOGRAM N/A 07/23/2013   Procedure: LEFT HEART CATHETERIZATION WITH CORONARY ANGIOGRAM;  Surgeon: Peter M Swaziland, MD;  Location: [facility] CATH LAB;  Service: Cardiovascular;  Laterality: N/A;     reports that he has been smoking cigarettes. He has a 30.00 pack-year smoking history. He has never used smokeless tobacco. He reports current alcohol use. He reports current drug use. Drug: Marijuana.  Allergies  Allergen Reactions   . Iodine Anaphylaxis and Swelling  . Shellfish Allergy Anaphylaxis    All shellfish  . Contrast Media [Iodinated Diagnostic Agents] Nausea And Vomiting    Family History  Problem Relation Age of Onset  . CAD Mother   . Heart disease Mother   . Heart attack Mother   . Schizophrenia Mother   . CAD Sister      Prior to Admission medications   Medication Sig Start Date End Date Taking? Authorizing Provider  ARIPiprazole (ABILIFY) 15 MG tablet Take 1 tablet (15 mg total) by mouth daily. 02/28/17  Yes Meuth, Lina Sar, PA-C  aspirin EC 81 MG tablet Take 81 mg daily by mouth.   Yes [provider]  clopidogrel (PLAVIX) 75 MG tablet Take 75 mg by mouth daily.   Yes [provider]  lisinopril (PRINIVIL,ZESTRIL) 5 MG tablet Take 1 tablet (5 mg total) by mouth daily. 03/11/17  Yes Lennette Bihari, MD  metoprolol succinate (TOPROL XL) 25 MG 24 hr tablet Take 0.5 tablets (12.5 mg total) by mouth daily. 03/11/17  Yes Lennette Bihari, MD  nitroGLYCERIN (NITROSTAT)  0.4 MG SL tablet Place 1 tablet (0.4 mg total) under the tongue every 5 (five) minutes x 3 doses as needed for chest pain. 10/25/16  Yes Armandina Stammer I, NP  traZODone (DESYREL) 100 MG tablet Take 1 tablet (100 mg total) by mouth at bedtime as needed for sleep. 02/28/17  Yes Meuth, Brooke A, PA-C  rosuvastatin (CRESTOR) 40 MG tablet Take 1 tablet (40 mg total) by mouth daily. Patient not taking: Reported on 02/12/2018 03/11/17   Lennette Bihari, MD    Physical Exam: Vitals:   02/12/18 0030 02/12/18 0045 02/12/18 0100 02/12/18 0115  BP: 115/74 127/78 109/81 106/76  Pulse: (!) 48 (!) 51 70 (!) 58  Resp: (!) 23 18 18 15   Temp:      TempSrc:      SpO2: 99% 98% 97% 97%    Constitutional: NAD, calm  Eyes: PERTLA, lids and conjunctivae normal ENMT: Mucous membranes are moist. Posterior pharynx clear of any exudate or lesions.   Neck: normal, supple, no masses, no thyromegaly Respiratory: clear to auscultation bilaterally, no  wheezing, no crackles. Normal respiratory effort.    Cardiovascular: S1 & S2 heard, regular rate and rhythm. No extremity edema.   Abdomen: No distension, no tenderness, soft. Bowel sounds active.  Musculoskeletal: no clubbing / cyanosis. No joint deformity upper and lower extremities. Normal muscle tone.  Skin: no significant rashes, lesions, ulcers. Warm, dry, well-perfused. Neurologic: CN 2-12 grossly intact. Sensation intact. Strength 5/5 in all 4 limbs.  Psychiatric: Alert and oriented x 3. Pleasant, cooperative.    Labs on Admission: I have personally reviewed following labs and imaging studies  CBC: Recent Labs  Lab 02/11/18 2301  WBC 5.3  HGB 11.6*  HCT 35.3*  MCV 91.0  PLT 228   Basic Metabolic Panel: Recent Labs  Lab 02/11/18 2301  NA 133*  K 3.5  CL 102  CO2 19*  GLUCOSE 112*  BUN 6  CREATININE 1.32*  CALCIUM 8.5*   GFR: CrCl cannot be calculated (Unknown ideal weight.). Liver Function Tests: No results for input(s): AST, ALT, ALKPHOS, BILITOT, PROT, ALBUMIN in the last 168 hours. No results for input(s): LIPASE, AMYLASE in the last 168 hours. No results for input(s): AMMONIA in the last 168 hours. Coagulation Profile: No results for input(s): INR, PROTIME in the last 168 hours. Cardiac Enzymes: No results for input(s): CKTOTAL, CKMB, CKMBINDEX, TROPONINI in the last 168 hours. BNP (last 3 results) No results for input(s): PROBNP in the last 8760 hours. HbA1C: No results for input(s): HGBA1C in the last 72 hours. CBG: No results for input(s): GLUCAP in the last 168 hours. Lipid Profile: No results for input(s): CHOL, HDL, LDLCALC, TRIG, CHOLHDL, LDLDIRECT in the last 72 hours. Thyroid Function Tests: No results for input(s): TSH, T4TOTAL, FREET4, T3FREE, THYROIDAB in the last 72 hours. Anemia Panel: No results for input(s): VITAMINB12, FOLATE, FERRITIN, TIBC, IRON, RETICCTPCT in the last 72 hours. Urine analysis:    Component Value Date/Time    COLORURINE AMBER (A) 02/19/2017 1742   APPEARANCEUR HAZY (A) 02/19/2017 1742   LABSPEC 1.034 (H) 02/19/2017 1742   PHURINE 6.0 02/19/2017 1742   GLUCOSEU 50 (A) 02/19/2017 1742   HGBUR NEGATIVE 02/19/2017 1742   HGBUR negative 01/27/2009 1039   BILIRUBINUR SMALL (A) 02/19/2017 1742   KETONESUR 80 (A) 02/19/2017 1742   PROTEINUR 100 (A) 02/19/2017 1742   UROBILINOGEN 0.2 07/07/2015 1150   NITRITE NEGATIVE 02/19/2017 1742   LEUKOCYTESUR NEGATIVE 02/19/2017 1742   Sepsis Labs: @  LABRCNTIP(procalcitonin:4,lacticidven:4) )No results found for this or any previous visit (from the past 240 hour(s)).   Radiological Exams on Admission: Dg Chest 2 View  Result Date: 02/11/2018 CLINICAL DATA:  Intermittent mid chest pain and dizziness. EXAM: CHEST - 2 VIEW COMPARISON:  11/02/2017 FINDINGS: The heart size and mediastinal contours are within normal limits. Both lungs are clear. The visualized skeletal structures are unremarkable. Coronary artery stent. IMPRESSION: No active cardiopulmonary disease. Electronically Signed   By: Burman NievesWilliam  Stevens M.D.   On: 02/11/2018 23:17    EKG: Independently reviewed. Sinus rhythm, incomplete RBBB.   Assessment/Plan  1. Chest pain; CAD  - Presents after an episode of chest pain that resolved spontaneously prior to arrival  - He has known CAD with stents and hx of in-stent restenosis  - No acute ST-T changes noted on EKG, initial troponin is 0.01, and CXR unremarkable  - Cardiology was consulted by ED physician and recommended medical admission  - Treat with ASA 324 mg, continue cardiac monitoring, obtain serial troponin measurements, repeat EKG, continue statin, continue ASA and Plavix, continue ACE and beta-blocker    2. Bipolar disorder  - Stable  - Continue Abilify and trazodone   3. Chronic systolic CHF  - Appears compensated  - Continue ACE and beta-blocker    4. Mild renal insufficiency  - SCr is 1.32 on admission, similar to priors in 1-1.5 range   - Avoid dehydration or hypotension, renally-dose medications     DVT prophylaxis: Lovenox  Code Status: Full  Family Communication: Discussed with patient  Consults called: Cardiology consulted by ED physician  Admission status: Observation     Briscoe Deutscherimothy S , MD [facility] Pager [phone]  If 7PM-7AM, please contact night-coverage [email or web address] Password [email or web address]  02/12/2018, 2:34 AM

## 2018-02-12 NOTE — Progress Notes (Signed)
Patient admitted after midnight, please see H&P.  Here with chest pain that resolved prior to admission.  Known h/o CAD s/p stent with re-thrombosis. Last here with chest pain in 10/19 with stress test done at that time: no reversible ischemia- ASA/statin and smoking cessation encouraged.   CE negative thus far Marlin Canary DO

## 2018-03-06 ENCOUNTER — Ambulatory Visit: Payer: Medicare Other | Admitting: Physician Assistant

## 2018-06-02 IMAGING — CT CT ABD-PELV W/O CM
2 of 4 series · 16 of 46 positions shown, 18 images · non-contrast
Comparison: CTA abdomen/ pelvis dated 04/15/2013

CLINICAL DATA: Epigastric pain, nausea/vomiting/diarrhea

EXAM:
CT ABDOMEN AND PELVIS WITHOUT CONTRAST
TECHNIQUE: Multidetector CT imaging of the abdomen and pelvis was performed
following the standard protocol without IV contrast.

[Series 2: abd/pel w/o · axial · non-contrast · 0.69mm/px · z∈[+595,+995]mm · 13 of 90 slices shown, 15 images]
[im 5/90  soft-tissue]
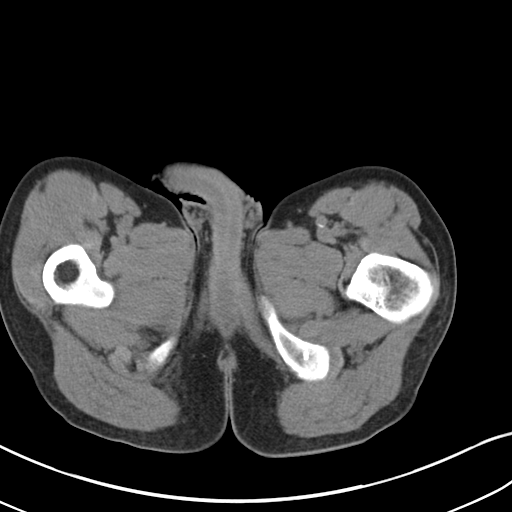
[im 5/90  bone]
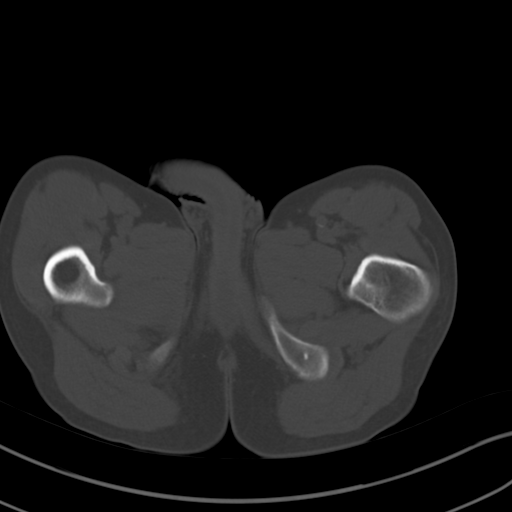
[im 10/90  soft-tissue]
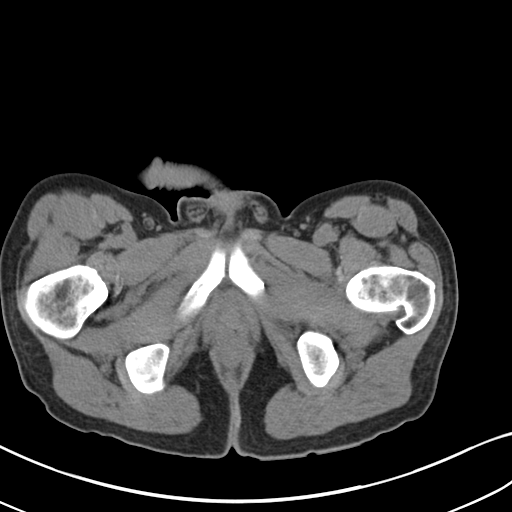
[im 20/90  soft-tissue]
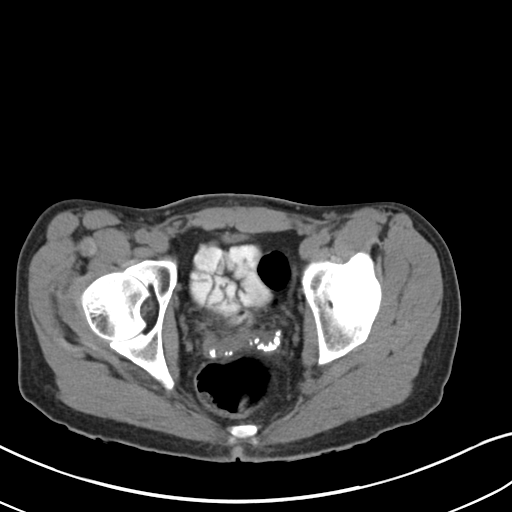
[im 25/90  soft-tissue]
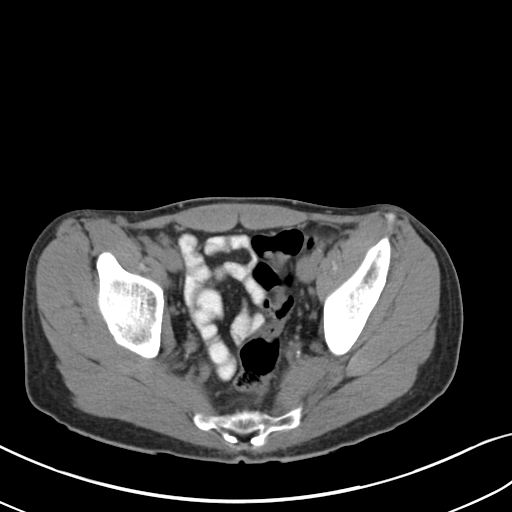
[im 30/90  soft-tissue]
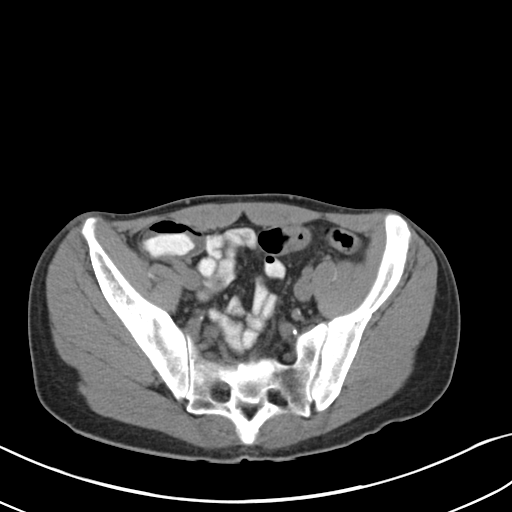
[im 40/90  soft-tissue]
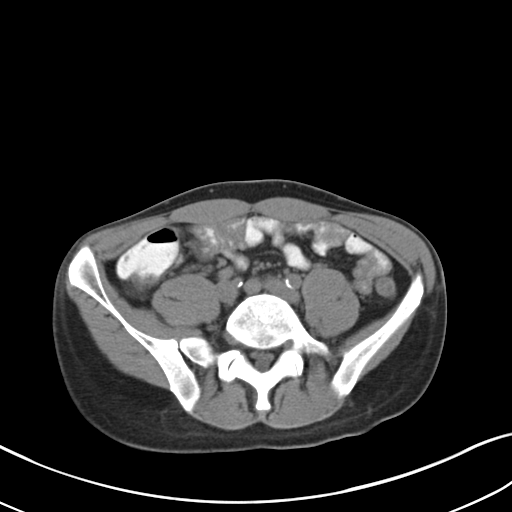
[im 45/90  soft-tissue]
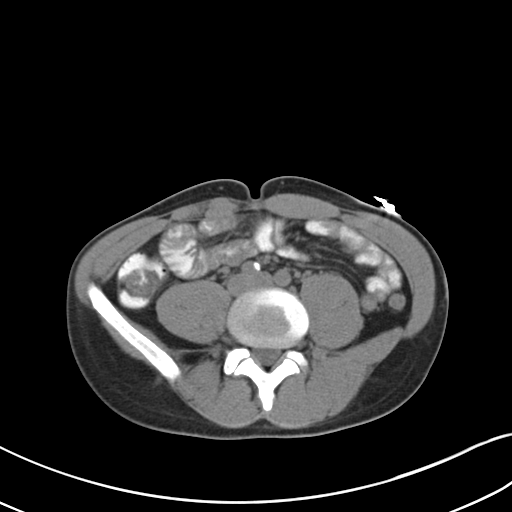
[im 50/90  soft-tissue]
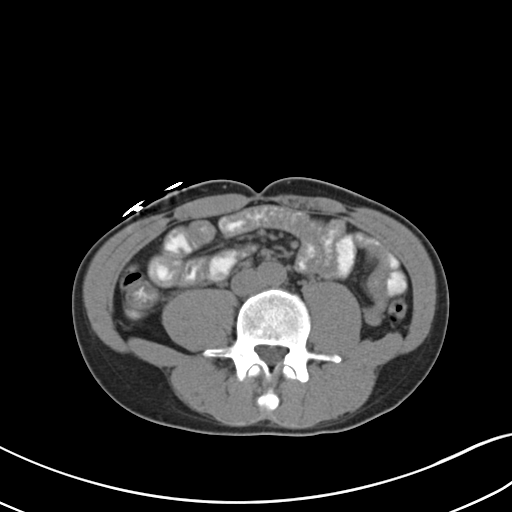
[im 60/90  soft-tissue]
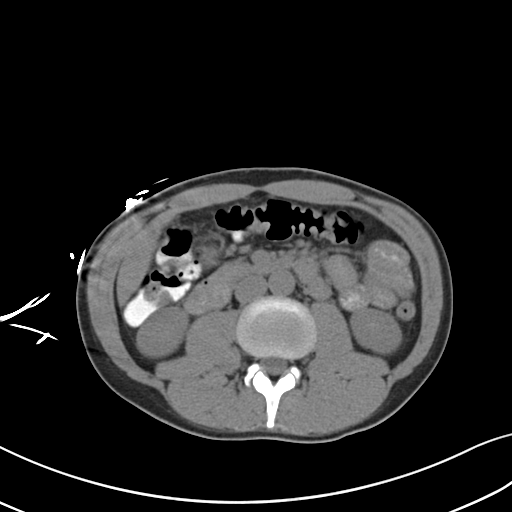
[im 60/90  bone]
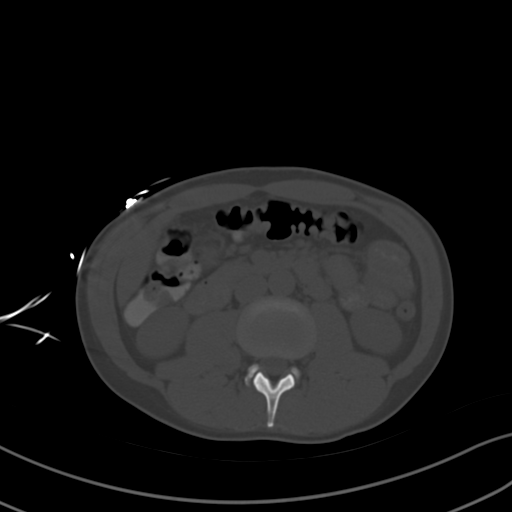
[im 65/90  soft-tissue]
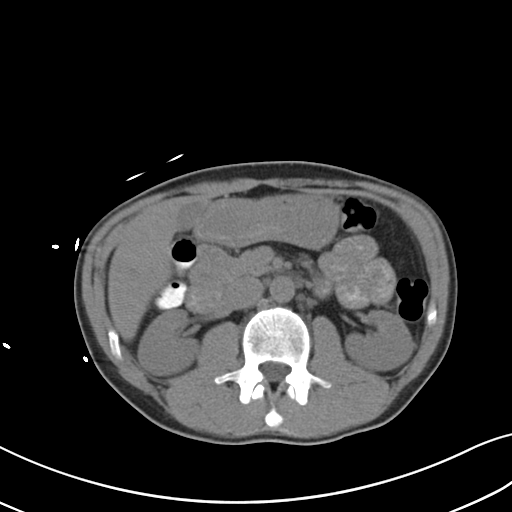
[im 70/90  soft-tissue]
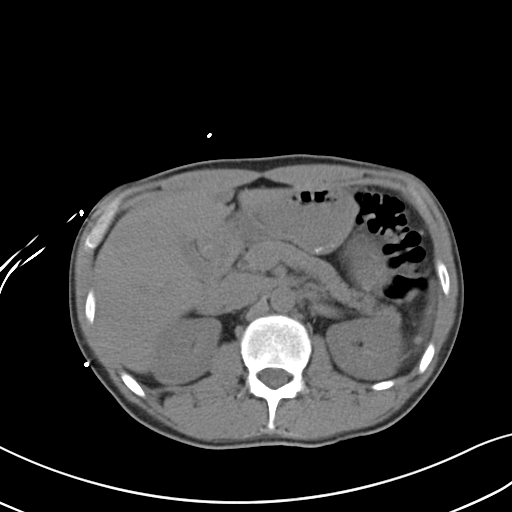
[im 80/90  soft-tissue]
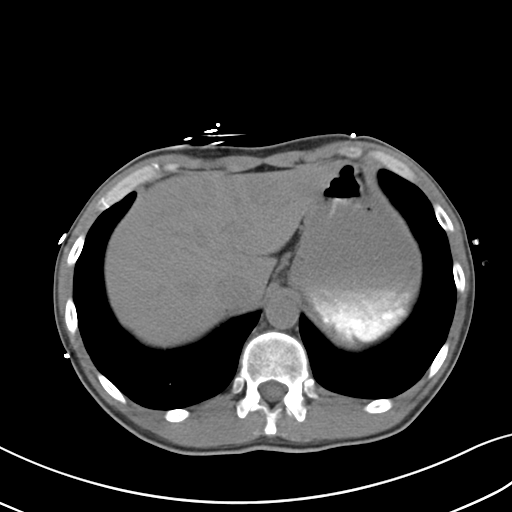
[im 85/90  soft-tissue]
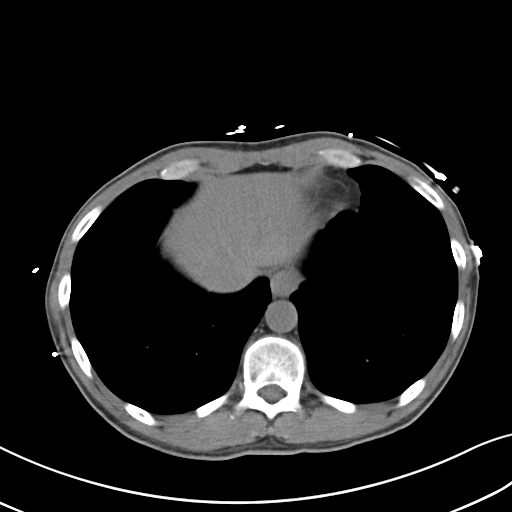

[Series 3: coronal · coronal · 0.66mm/px · 3 of 103 slices shown]
[im 35/103  soft-tissue]
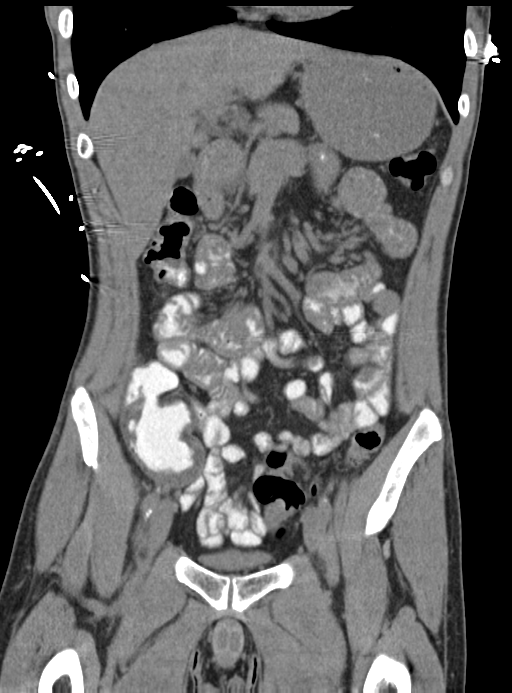
[im 46/103  soft-tissue]
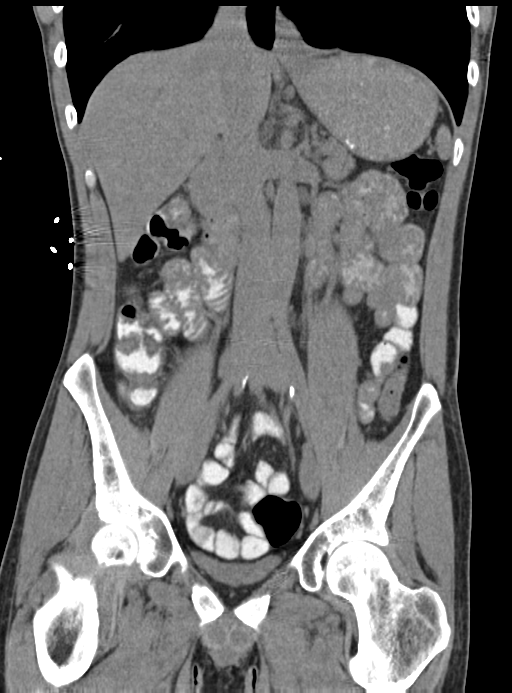
[im 57/103  soft-tissue]
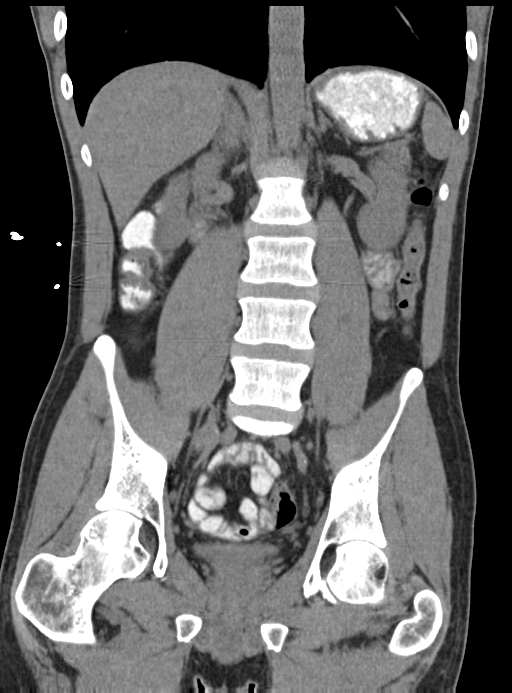

[16 of 46 positions shown; findings below may reference images not displayed]

FINDINGS: Lower chest: Lung bases are clear.

Hepatobiliary: Focal fat in the medial segment left hepatic lobe
(series 2/image 20). Unenhanced liver is otherwise unremarkable.

Gallbladder is unremarkable. No intrahepatic or extrahepatic ductal
dilatation.

Pancreas: Within normal limits.

Spleen: Within normal limits.

Adrenals/Urinary Tract: Adrenal glands are within normal limits.

Kidneys are within normal limits.

No renal, ureteral, or bladder calculi.  No hydronephrosis.

Bladder is underdistended but unremarkable.

Stomach/Bowel: Stomach is within normal limits.

No evidence bowel obstruction.

Appendix is not discretely visualized.

Vascular/Lymphatic: No evidence of abdominal aortic aneurysm. Mild
atherosclerotic calcifications of the bilateral iliac arteries.

No suspicious abdominopelvic lymphadenopathy.

Reproductive: Prostate is unremarkable.

Other: No abdominopelvic ascites.

Musculoskeletal: Visualized osseous structures are within normal
limits.
IMPRESSION: No CT findings to account for the patient's abdominal pain.

## 2018-07-04 ENCOUNTER — Emergency Department (HOSPITAL_COMMUNITY): Payer: Medicare Other

## 2018-07-04 ENCOUNTER — Emergency Department (HOSPITAL_COMMUNITY)
Admission: EM | Admit: 2018-07-04 | Discharge: 2018-07-04 | Disposition: A | Discharge status: Home / Self Care | Payer: Medicare Other | Attending: Emergency Medicine | Admitting: Emergency Medicine

## 2018-07-04 DIAGNOSIS — I2510 Atherosclerotic heart disease of native coronary artery without angina pectoris: Secondary | ICD-10-CM | POA: Diagnosis not present

## 2018-07-04 DIAGNOSIS — Z03818 Encounter for observation for suspected exposure to other biological agents ruled out: Secondary | ICD-10-CM | POA: Diagnosis not present

## 2018-07-04 DIAGNOSIS — Z79899 Other long term (current) drug therapy: Secondary | ICD-10-CM | POA: Insufficient documentation

## 2018-07-04 DIAGNOSIS — I110 Hypertensive heart disease with heart failure: Secondary | ICD-10-CM | POA: Insufficient documentation

## 2018-07-04 DIAGNOSIS — Z7982 Long term (current) use of aspirin: Secondary | ICD-10-CM | POA: Insufficient documentation

## 2018-07-04 DIAGNOSIS — I252 Old myocardial infarction: Secondary | ICD-10-CM | POA: Insufficient documentation

## 2018-07-04 DIAGNOSIS — R5383 Other fatigue: Secondary | ICD-10-CM | POA: Diagnosis not present

## 2018-07-04 DIAGNOSIS — F17210 Nicotine dependence, cigarettes, uncomplicated: Secondary | ICD-10-CM | POA: Insufficient documentation

## 2018-07-04 DIAGNOSIS — I5022 Chronic systolic (congestive) heart failure: Secondary | ICD-10-CM | POA: Diagnosis not present

## 2018-07-04 DIAGNOSIS — Z7902 Long term (current) use of antithrombotics/antiplatelets: Secondary | ICD-10-CM | POA: Insufficient documentation

## 2018-07-04 DIAGNOSIS — R0789 Other chest pain: Secondary | ICD-10-CM | POA: Diagnosis present

## 2018-07-04 LAB — BASIC METABOLIC PANEL
Anion gap: 12 (ref 5–15)
BUN: 5 mg/dL — ABNORMAL LOW (ref 6–20)
CO2: 17 mmol/L — ABNORMAL LOW (ref 22–32)
Calcium: 9.3 mg/dL (ref 8.9–10.3)
Chloride: 106 mmol/L (ref 98–111)
Creatinine, Ser: 1.42 mg/dL — ABNORMAL HIGH (ref 0.61–1.24)
GFR calc Af Amer: >60 mL/min (ref >60)
GFR calc non Af Amer: >60 mL/min (ref >60)
Glucose, Bld: 86 mg/dL (ref 70–99)
Potassium: 4.4 mmol/L (ref 3.5–5.1)
Sodium: 135 mmol/L (ref 135–145)

## 2018-07-04 LAB — CBC
HCT: 43.5 % (ref 39.0–52.0)
Hemoglobin: 14.4 g/dL (ref 13.0–17.0)
MCH: 30.1 pg (ref 26.0–34.0)
MCHC: 33.1 g/dL (ref 30.0–36.0)
MCV: 91 fL (ref 80.0–100.0)
Platelets: 268 10*3/uL (ref 150–400)
RBC: 4.78 MIL/uL (ref 4.22–5.81)
RDW: 17 % — ABNORMAL HIGH (ref 11.5–15.5)
WBC: 5.4 10*3/uL (ref 4.0–10.5)
nRBC: 0 % (ref 0.0–0.2)

## 2018-07-04 LAB — HEPATIC FUNCTION PANEL
ALT: 27 U/L (ref 0–44)
AST: 26 U/L (ref 15–41)
Albumin: 4.2 g/dL (ref 3.5–5.0)
Alkaline Phosphatase: 84 U/L (ref 38–126)
Bilirubin, Direct: 0.1 mg/dL (ref 0.0–0.2)
Indirect Bilirubin: 0.5 mg/dL (ref 0.3–0.9)
Total Bilirubin: 0.6 mg/dL (ref 0.3–1.2)
Total Protein: 7.7 g/dL (ref 6.5–8.1)

## 2018-07-04 LAB — RAPID URINE DRUG SCREEN, HOSP PERFORMED
Amphetamines: NOT DETECTED
Barbiturates: NOT DETECTED
Benzodiazepines: NOT DETECTED
Cocaine: NOT DETECTED
Opiates: NOT DETECTED
Tetrahydrocannabinol: POSITIVE — AB

## 2018-07-04 LAB — TROPONIN I (HIGH SENSITIVITY)
Troponin I (High Sensitivity): 3 ng/L (ref <18)
Troponin I (High Sensitivity): 3 ng/L (ref <18)

## 2018-07-04 LAB — LIPASE, BLOOD: Lipase: 31 U/L (ref 11–51)

## 2018-07-04 MED ORDER — SODIUM CHLORIDE 0.9% FLUSH: 3.0000 mL | Freq: Once | INTRAVENOUS | Status: DC

## 2018-07-04 NOTE — ED Triage Notes (Signed)
Pt arrives by [facility] with c/o of chest pain that started 1 month ago and fatigue for the last 2 weeks. Pt has major cardiac hx with 7 previous MIs & 4 stents. Pt did not receive asa or nitro with ems.

## 2018-07-04 NOTE — Discharge Instructions (Addendum)
You have a send out coronavirus lab.  Please wear mask when you are in public.  You should be self isolating until these results in 2 days.  Have given you home isolation precautions to read.  Please call your cardiologist and follow-up very closely.  There is no evidence that the cause of your symptoms today are related to your heart.  However you do have high risk factors for heart disease and should call your cardiologist ultimately to follow-up closely.  These get your medications and begin taking them as soon as possible.  Sure you are taking care of yourself.  Please try to discontinue smoking cigarettes which will help you live a longer healthier life.

## 2018-07-04 NOTE — ED Provider Notes (Signed)
[name] Provider Note   CSN: [id number] Arrival date & time: 07/04/18  1018     History   Chief Complaint Chief Complaint  Patient presents with  . Chest Pain    HPI [name] is a 43 y.o. male with  has a past medical history of Anxiety, Bipolar 1 disorder (HCC), CAD S/P percutaneous coronary angioplasty, medication noncompliance, Hyperlipidemia, Hypertension, Hypertensive heart disease, Ischemic cardiomyopathy, NSVT (nonsustained ventricular tachycardia) (HCC), Polysubstance abuse (HCC), Schizophrenia (HCC), and Tobacco abuse.  43 year old male who presents the emergency department with chief complaint of chest pressure and exertional dyspnea.  He has a past medical history significant for 7 previous myocardial infarctions for drug-eluting stents.  He is a current daily smoker smoking about 10 cigarettes daily.  Patient states that he has been unable to obtain his medication because of the coronavirus and changes in the bus route which would not take him close to his pharmacy.  He states that those changes were reversed today and he is excited that he will be able to get his medications again.  For the past month he has felt a sensation of pressure on his chest which is nonexertional and does not change with position.  He for the past 2 weeks has had significant fatigue and lack of appetite.  The patient was reading over previous return precautions and decided to come to the emergency department for evaluation because he was afraid it might be caused by his heart.  He denies orthopnea, peripheral edema, history of CHF.  He does complain of 1 out of 10 chest pain which is also been persistent for 1 month.  He denies diaphoresis, nausea, vomiting.  HPI  Past Medical History:  Diagnosis Date  . Anxiety   . Bipolar 1 disorder (HCC)   . CAD S/P percutaneous coronary angioplasty    a. 01/2007 Inf STEMI: BMS-> RCA; b. 07/2013 NSTEMI: PTCA->RCA for  ISR; c. 01/2014: Inf STEMI->PTCA/DES to pRCA &dRCA; d. 12/2014 Inf STEMI: RCA patent prox stent, 16037m/d (3.0x32 Synergy DES); e. 05/2015 Inf STEMI: D1 75, RCA stents ok->Med rx; f. 09/2015 PCI: RCA 17037m/d (thrombectomy); g. 07/2016 Cath: LM nl, LAD 40, D1 80, LCX ok, OM1 25, OM2 30, RCA stents ok, RPDA 30; h. 10/2017 MV: No isch.  Marland Kitchen. Hx of medication noncompliance   . Hyperlipidemia   . Hypertension   . Hypertensive heart disease   . Ischemic cardiomyopathy    a. EF 40% in 2011, 60% in 2012. b. EF 55% by cath 07/2013. c. EF 50% by cath 01/2014; d. 12/2014 EF 35-45% by LV gram; e. 05/2015 Echo: EF 50-55% inflat/inf AKI; f. 06/2015 cMRI: EF 49%, basal & mid inf full thickness scar, subendocardial scar in antsept and antlat wall, correlating w/ D1 dzs; g. 02/2017 Echo: EF 45-50%, diff HK. Antsep/infsept AK. Mod AI, triv MR.  Marland Kitchen. NSVT (nonsustained ventricular tachycardia) (HCC)    a. Very brief run during 07/2013 admit for NSTEMI felt due to MI.  . Polysubstance abuse (HCC)    a. h/o tobacco, marijuana and crack cocaine use. b. 07/2013: +UDS THC, neg for cocaine.  . Schizophrenia (HCC)    pt report   . Tobacco abuse     Patient Active Problem List   Diagnosis Date Noted  . Chest pain 02/12/2018  . Mild renal insufficiency 02/12/2018  . Hyponatremia 02/12/2018  . GERD (gastroesophageal reflux disease) 11/03/2017  . Chest pressure 11/03/2017  . Peritonitis (HCC) 02/20/2017  .  Perforated gastric ulcer ([place]) 02/20/2017  . Bipolar I disorder, most recent episode mixed ([place]) 10/22/2016  . Unstable angina ([place])   . Chronic systolic CHF (congestive heart failure) ([place]) 07/08/2016  . Left leg numbness   . Tetrahydrocannabinol (THC) use disorder, severe, dependence ([place]) 03/14/2016  . Malnutrition of moderate degree 09/28/2015  . Hyperlipidemia   . Schizoaffective disorder, bipolar type ([place]) 03/06/2015  . Nausea with vomiting 03/06/2015  . Hypertensive heart disease 01/11/2015  . ST elevation myocardial  infarction (STEMI) of inferior wall ([place]) 01/10/2015  . ST elevation myocardial infarction (STEMI) of inferior wall, initial episode of care ([place]) 01/10/2015  . Medical non-compliance   . ST elevation myocardial infarction involving right coronary artery ([place]) 01/15/2014  . Hx of medication noncompliance   . NSVT (nonsustained ventricular tachycardia) ([place])   . Ischemic cardiomyopathy   . Tobacco abuse   . Anxiety   . 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 DES PCI to RCA x 2 (3.0 mm x 22 mm Resolute - distal & proximal RCA) 01/14/2014    Class: Present on Admission  . Non-STEMI (non-ST elevated myocardial infarction) ([place]) 07/22/2013  . Atypical chest pain 07/21/2013  . Bipolar 1 disorder, depressed ([place]) 07/21/2013  . HLD (hyperlipidemia) 04/16/2013  . Hyperlipidemia with target LDL less than 70 01/27/2009  . SUBSTANCE ABUSE 01/27/2009  . Depression 01/27/2009  . Essential hypertension 01/27/2009  . CAD S/P BMS PCI to RCA with PTCA for ISR --> followed by stent thrombosis - DES PCI 01/27/2009  . GASTRITIS 01/27/2009    Past Surgical History:  Procedure Laterality Date  . CARDIAC CATHETERIZATION  01/16/2009   normal left main, Cfx with 2-OMs both w/minor irregularities, LAd with 20-30% mid region irregularities, ramus intermediate/optional diagonal with 60% osital narrowing, RCA with stent in distal portion w/20% prox in-stent stenosis (Dr. Loni Muse. Little)  . CARDIAC CATHETERIZATION  07/23/2013   two vessel obstructive CAD, occluded first diagonal, focal in-stent restenosis in distal RCA (Dr. Peter Martinique)  . CARDIAC CATHETERIZATION N/A 01/10/2015   Procedure: Left Heart Cath and Coronary Angiography;  Surgeon: Leonie Man, MD;  Location: [place] [facility];  Service: Cardiovascular;  Laterality: N/A;  . CARDIAC CATHETERIZATION N/A 01/10/2015   Procedure: Coronary Stent Intervention;  Surgeon: Leonie Man, MD;  Location: [place] [facility];  Service: Cardiovascular;  Laterality:  N/A;  . CARDIAC CATHETERIZATION N/A 06/10/2015   Procedure: Left Heart Cath and Coronary Angiography;  Surgeon: Peter M Martinique, MD;  Location: [facility];  Service: Cardiovascular;  Laterality: N/A;  . CARDIAC CATHETERIZATION N/A 09/27/2015   Procedure: Left Heart Cath and Coronary Angiography;  Surgeon: Jettie Booze, MD;  Location: [place] [facility];  Service: Cardiovascular;  Laterality: N/A;  . CARDIAC CATHETERIZATION N/A 09/27/2015   Procedure: Coronary Balloon Angioplasty;  Surgeon: Jettie Booze, MD;  Location: [place] [facility];  Service: Cardiovascular;  Laterality: N/A;  . CORONARY ANGIOPLASTY WITH STENT PLACEMENT  02/05/2007   3.5x54mm Quantum non-DES to RCA (Dr. Shelva Majestic)  . FEMORAL ARTERY STENT    . LAPAROTOMY N/A 02/20/2017   Procedure: EXPLORATORY LAPAROTOMY AND REPAIR, PATCH, OR REMOVE PERFORATION IN BOWEL;  Surgeon: Rolm Bookbinder, MD;  Location: [place];  Service: General;  Laterality: N/A;  . LEFT AND RIGHT HEART CATHETERIZATION WITH CORONARY ANGIOGRAM N/A 01/14/2014   Procedure: LEFT AND RIGHT HEART CATHETERIZATION WITH CORONARY ANGIOGRAM;  Surgeon: Burnell Blanks, MD;  Location[facility];  Service: Cardiovascular;  Laterality: N/A;  .  LEFT HEART CATH AND CORONARY ANGIOGRAPHY N/A 07/12/2016   Procedure: Left Heart Cath and Coronary Angiography;  Surgeon: SwazilandJordan, Peter M, MD;  [facility];  Service: Cardiovascular;  Laterality: N/A;  . LEFT HEART CATHETERIZATION WITH CORONARY ANGIOGRAM N/A 07/23/2013   Procedure: LEFT HEART CATHETERIZATION WITH CORONARY ANGIOGRAM;  Surgeon: Peter M SwazilandJordan, MD;  [facility];  Service: Cardiovascular;  Laterality: N/A;        Home Medications    Prior to Admission medications   Medication Sig Start Date End Date Taking? Authorizing Provider  ARIPiprazole (ABILIFY) 15 MG tablet Take 1 tablet (15 mg total) by mouth daily. 02/28/17   Meuth, Lina SarBrooke A, PA-C  aspirin EC 81 MG tablet Take 81  mg daily by mouth.    [provider]  clopidogrel (PLAVIX) 75 MG tablet Take 75 mg by mouth daily.    [provider]  lisinopril (PRINIVIL,ZESTRIL) 5 MG tablet Take 1 tablet (5 mg total) by mouth daily. 03/11/17   Lennette BihariKelly, Thomas A, MD  metoprolol succinate (TOPROL XL) 25 MG 24 hr tablet Take 0.5 tablets (12.5 mg total) by mouth daily. 03/11/17   Lennette BihariKelly, Thomas A, MD  nitroGLYCERIN (NITROSTAT) 0.4 MG SL tablet Place 1 tablet (0.4 mg total) under the tongue every 5 (five) minutes x 3 doses as needed for chest pain. 02/12/18   Joseph ArtVann, Jessica U, DO  rosuvastatin (CRESTOR) 40 MG tablet Take 1 tablet (40 mg total) by mouth daily. Patient not taking: Reported on 02/12/2018 03/11/17   Lennette BihariKelly, Thomas A, MD  traZODone (DESYREL) 100 MG tablet Take 1 tablet (100 mg total) by mouth at bedtime as needed for sleep. 02/28/17   Meuth, Lina SarBrooke A, PA-C    Family History Family History  Problem Relation Age of Onset  . CAD Mother   . Heart disease Mother   . Heart attack Mother   . Schizophrenia Mother   . CAD Sister     Social History Social History   Tobacco Use  . Smoking status: Current Every Day Smoker    Packs/day: 1.00    Years: 30.00    Pack years: 30.00    Types: Cigarettes  . Smokeless tobacco: Never Used  Substance Use Topics  . Alcohol use: Yes    Alcohol/week: 0.0 standard drinks    Comment: 40 oz  . Drug use: Yes    Types: Marijuana    Comment: current user- daily one-two blunts     Allergies   Iodine, Shellfish allergy, and Contrast media [iodinated diagnostic agents]   Review of Systems Review of Systems Ten systems reviewed and are negative for acute change, except as noted in the HPI.    Physical Exam Updated Vital Signs BP (!) 147/88 (BP Location: Right Arm)   Pulse (!) 57   Temp 98.2 F (36.8 C) (Oral)   Resp 16   SpO2 100%   Physical Exam Vitals signs and nursing note reviewed.  Constitutional:      General: He is not in acute distress.     Appearance: He is well-developed. He is not diaphoretic.  HENT:     Head: Normocephalic and atraumatic.  Eyes:     General: No scleral icterus.    Conjunctiva/sclera: Conjunctivae normal.  Neck:     Musculoskeletal: Normal range of motion and neck supple.  Cardiovascular:     Rate and Rhythm: Normal rate and regular rhythm.     Heart sounds: Normal heart sounds.  Pulmonary:  Effort: Pulmonary effort is normal. No respiratory distress.     Breath sounds: Normal breath sounds.  Abdominal:     Palpations: Abdomen is soft.     Tenderness: There is no abdominal tenderness.  Skin:    General: Skin is warm and dry.  Neurological:     Mental Status: He is alert.  Psychiatric:        Behavior: Behavior normal.      ED Treatments / Results  Labs (all labs ordered are listed, but only abnormal results are displayed) Labs Reviewed  BASIC METABOLIC PANEL - Abnormal; Notable for the following components:      Result Value   CO2 17 (*)    BUN 5 (*)    Creatinine, Ser 1.42 (*)    All other components within normal limits  CBC - Abnormal; Notable for the following components:   RDW 17.0 (*)    All other components within normal limits  TROPONIN I (HIGH SENSITIVITY)  TROPONIN I (HIGH SENSITIVITY)    EKG EKG Interpretation  Date/Time:  Tuesday July 04 2018 10:26:25 EDT Ventricular Rate:  62 PR Interval:  144 QRS Duration: 102 QT Interval:  436 QTC Calculation: 442 R Axis:   28 Text Interpretation:  Sinus rhythm with sinus arrhythmia with occasional Premature ventricular complexes Incomplete right bundle branch block Inferior infarct , age undetermined Abnormal ECG Confirmed by Raeford RazorKohut, Stephen [phone]([id number]) on 07/04/2018 12:09:53 PM   Radiology Dg Chest 2 View  Result Date: 07/04/2018 CLINICAL DATA:  Left-sided chest pressure and tiredness for 5 days. EXAM: CHEST - 2 VIEW COMPARISON:  Radiographs 02/11/2018.  CT 04/15/2013. FINDINGS: The heart size and mediastinal contours are  stable. Coronary artery stents are noted. The lungs are clear. There are prominent nipple shadows bilaterally. There is no pleural effusion or pneumothorax. The bones appear unremarkable. IMPRESSION: Stable chest.  No active cardiopulmonary process. Electronically Signed   By: Carey BullocksWilliam  Veazey M.D.   On: 07/04/2018 10:56    Procedures Procedures (including critical care time)  Medications Ordered in ED Medications  sodium chloride flush (NS) 0.9 % injection 3 mL (has no administration in time range)     Initial Impression / Assessment and Plan / ED Course  I have reviewed the triage vital signs and the nursing notes.  Pertinent labs & imaging results that were available during my care of the patient were reviewed by me and considered in my medical decision making (see chart for details).        CC: Fatigue, exertional dyspnea VS:  Vitals:   07/04/18 1300 07/04/18 1315 07/04/18 1330 07/04/18 1445  BP:    123/86  Pulse: (!) 54  (!) 59   Resp: 16 14 19  (!) 24  Temp:      TempSrc:      SpO2: 100%  98%     UE:[id number]:History is gathered by patient and review of EMR.. DDX:The differential diagnosis of weakness includes but is not limited to neurologic causes (GBS, myasthenia gravis, CVA, MS, ALS, transverse myelitis, spinal cord injury, CVA, botulism, ) and other causes: ACS, Arrhythmia, syncope, orthostatic hypotension, sepsis, hypoglycemia, electrolyte disturbance, hypothyroidism, respiratory failure, symptomatic anemia, dehydration, heat injury, polypharmacy, malignancy.  Labs: I reviewed the labs which show UDS negative for cocaine but positive for marijuana, 2- high-sensitivity troponins over 2 hours BMP shows some mild renal insufficiency, CBC without significant abnormality.  Normal hepatic function panel, negative lipase.  Imaging: I personally reviewed the images (2 view chest x-ray) which show(s) no  pulmonary edema or consolidation EKG:  EKG Interpretation  Date/Time:  Tuesday July 04 2018 10:26:25 EDT Ventricular Rate:  62 PR Interval:  144 QRS Duration: 102 QT Interval:  436 QTC Calculation: 442 R Axis:   28 Text Interpretation:  Sinus rhythm with sinus arrhythmia with occasional Premature ventricular complexes Incomplete right bundle branch block Inferior infarct , age undetermined Abnormal ECG Confirmed by Raeford Razor [phone]) on 07/04/2018 12:09:53 PM       MDM: Patient with significant cardiac history, 2- high-sensitivity troponins here in the emergency department I do not feel that this represents a cardiac cause.  Have given him a coronavirus send out test to rule out that as a cause of his fatigue and increased dyspnea.  I have advised patient he must begin taking his medications again.  He otherwise feels well and appropriate for discharge at this time.  I discussed return precautions and outpatient follow-up including close contact with his cardiology group. Patient disposition: Discharge Patient condition: Excellent. The patient appears reasonably screened and/or stabilized for discharge and I doubt any other medical condition or other [facility] requiring further screening, evaluation, or treatment in the ED at this time prior to discharge. I have discussed lab and/or imaging findings with the patient and answered all questions/concerns to the best of my ability. I have discussed return precautions and OP follow up.      Final Clinical Impressions(s) / ED Diagnoses   Final diagnoses:  None    ED Discharge Orders    None       Arthor Captain, PA-C 07/04/18 1659    Raeford Razor, MD 07/05/18 [phone]

## 2018-07-05 LAB — NOVEL CORONAVIRUS, NAA (HOSP ORDER, SEND-OUT TO REF LAB; TAT 18-24 HRS): SARS-CoV-2, NAA: NOT DETECTED

## 2018-07-22 ENCOUNTER — Other Ambulatory Visit: Payer: Self-pay | Admitting: Cardiovascular Disease

## 2018-07-25 ENCOUNTER — Other Ambulatory Visit: Payer: Self-pay | Admitting: Cardiovascular Disease

## 2018-10-02 ENCOUNTER — Other Ambulatory Visit: Payer: Self-pay | Admitting: Cardiovascular Disease

## 2018-10-02 MED ORDER — CLOPIDOGREL BISULFATE 75 MG PO TABS: 75.0000 mg | ORAL_TABLET | Freq: Every day | ORAL | 2 refills | Status: DC

## 2018-10-02 MED ORDER — NITROGLYCERIN 0.4 MG SL SUBL: 0.4000 mg | SUBLINGUAL_TABLET | SUBLINGUAL | 2 refills | Status: DC | PRN

## 2018-10-02 MED ORDER — METOPROLOL SUCCINATE ER 25 MG PO TB24: 12.5000 mg | ORAL_TABLET | Freq: Every day | ORAL | 2 refills | Status: DC

## 2018-10-02 MED ORDER — ROSUVASTATIN CALCIUM 40 MG PO TABS: 40.0000 mg | ORAL_TABLET | Freq: Every day | ORAL | 2 refills | Status: DC

## 2018-10-02 NOTE — Telephone Encounter (Signed)
° °*  STAT* If patient is at the pharmacy, call can be transferred to refill team.   1. Which medications need to be refilled? (please list name of each medication and dose if known)  clopidogrel (PLAVIX) 75 MG tablet metoprolol succinate (TOPROL-XL) 25 MG 24 hr tablet rosuvastatin (CRESTOR) 40 MG tablet nitroGLYCERIN (NITROSTAT) 0.4 MG SL tablet  2. Which pharmacy/location (including street and city if local pharmacy) is medication to be sent to?  [place] ([place]), [place] - [address]   3. Do they need a 30 day or 90 day supply? 30 with refills  Patient was not able to get an appt with Dr. Claiborne Billings until 12/15/18.

## 2018-10-02 NOTE — Telephone Encounter (Signed)
Let patient know that I refilled the medications that he requested to be refilled. 

## 2018-10-04 ENCOUNTER — Other Ambulatory Visit: Payer: Self-pay | Admitting: Cardiovascular Disease

## 2018-10-04 MED ORDER — LISINOPRIL 5 MG PO TABS: 5.0000 mg | ORAL_TABLET | Freq: Every day | ORAL | 0 refills | Status: DC

## 2018-10-04 NOTE — Telephone Encounter (Signed)
Rx(s) sent to pharmacy electronically. OV scheduled 12/15/2018 with MD

## 2018-10-04 NOTE — Telephone Encounter (Signed)
*  STAT* If patient is at the pharmacy, call can be transferred to refill team.   1. Which medications need to be refilled? (please list name of each medication and dose if known) lisinopril (ZESTRIL) 5 MG tablet  2. Which pharmacy/location (including street and city if local pharmacy) is medication to be sent to? [id number] - [place] ([place]), [place] - [address]  3. Do they need a 30 day or 90 day supply? 90  

## 2018-10-16 ENCOUNTER — Other Ambulatory Visit: Payer: Self-pay

## 2018-10-16 MED ORDER — ROSUVASTATIN CALCIUM 40 MG PO TABS: 40.0000 mg | ORAL_TABLET | Freq: Every day | ORAL | 1 refills | Status: DC

## 2018-12-12 ENCOUNTER — Other Ambulatory Visit: Payer: Self-pay | Admitting: Cardiovascular Disease

## 2018-12-15 ENCOUNTER — Encounter: Payer: Self-pay | Admitting: Cardiovascular Disease

## 2018-12-15 ENCOUNTER — Other Ambulatory Visit: Payer: Self-pay

## 2018-12-15 ENCOUNTER — Ambulatory Visit (INDEPENDENT_AMBULATORY_CARE_PROVIDER_SITE_OTHER): Payer: Medicare Other | Admitting: Cardiovascular Disease

## 2018-12-15 DIAGNOSIS — I2510 Atherosclerotic heart disease of native coronary artery without angina pectoris: Secondary | ICD-10-CM

## 2018-12-15 DIAGNOSIS — Z9861 Coronary angioplasty status: Secondary | ICD-10-CM

## 2018-12-15 DIAGNOSIS — E785 Hyperlipidemia, unspecified: Secondary | ICD-10-CM

## 2018-12-15 DIAGNOSIS — I10 Essential (primary) hypertension: Secondary | ICD-10-CM | POA: Diagnosis not present

## 2018-12-15 DIAGNOSIS — F319 Bipolar disorder, unspecified: Secondary | ICD-10-CM

## 2018-12-15 DIAGNOSIS — F1290 Cannabis use, unspecified, uncomplicated: Secondary | ICD-10-CM

## 2018-12-15 DIAGNOSIS — I255 Ischemic cardiomyopathy: Secondary | ICD-10-CM | POA: Diagnosis not present

## 2018-12-15 MED ORDER — CLOPIDOGREL BISULFATE 75 MG PO TABS: 75.0000 mg | ORAL_TABLET | Freq: Every day | ORAL | 3 refills | Status: DC

## 2018-12-15 MED ORDER — METOPROLOL SUCCINATE ER 25 MG PO TB24: 12.5000 mg | ORAL_TABLET | Freq: Every day | ORAL | 3 refills | Status: DC

## 2018-12-15 MED ORDER — LISINOPRIL 5 MG PO TABS: 5.0000 mg | ORAL_TABLET | Freq: Every day | ORAL | 3 refills | Status: DC

## 2018-12-15 MED ORDER — ROSUVASTATIN CALCIUM 40 MG PO TABS: 40.0000 mg | ORAL_TABLET | Freq: Every day | ORAL | 3 refills | Status: DC

## 2018-12-15 NOTE — Progress Notes (Signed)
Patient ID: [name], male   DOB: [birth year], 43 y.o.   MRN: [id number]     HPI: [name] is a 43 y.o. male who presents to the office today for a  21 month follow up cardiology evaluation.    [name] has a history of CAD and suffered an inferior wall ST segment elevation myocardial infarction treated with bare-metal stenting to his RCA in 2009.  He has history of hypertension, hyperlipidemia,bipolar disorder, tobacco use, and marijuana use.  In the medical records there is indication of possible crack cocaine use, but the patient vehemently denies this.  He was admitted to [facility] on 07/21/2013 with squeezing chest pressure and ruled in for a non-STEMI with a peak troponin of 16.  He had transient nonsustained VT on telemetry, related to his infarct.  He was premedicated for possible dye allergy, and underwent cardiac catheterization by Dr. SwazilandJordan, which revealed occlusion of the first diagonal vessel, and focal restenosis within the previously placed distal RCA stent.  This was treated with 3.5 x 10 mm cutting balloon intervention to the in-stent restenosis with plans for medical therapy for the diagonal occlusion.  He had mild 20% stenoses in the LAD, and in addition to occlusion of the first diagonal vessel, the second diagonal vessel, 70% stenosis at the ostium.  The circumflex vessel had 50% stenosis in the midportion.  The proximal RCA had disease of 30% and had 80-90% focal in-stent restenosis.  Subsequently, he has undergone catheterization in 2016 by Dr. Herbie BaltimoreHarding, in May 2017 by Dr. SwazilandJordan and September 2017 by Dr. Eldridge DaceVaranasi and his last catheterization was in July 2018 by Dr. SwazilandJordan.  At his last catheterization he was found to have single-vessel obstruction involving a small diagonal branch, with mild stenoses in the LAD and circumflex with continued patency of stents in the proximal and distal RCA.  He had mild-to-moderate LV dysfunction with an EF of 45% and mild inferior  akinesis.  He was hospitalized on the psychiatry service with suicidal ideaion when he was off his bipolar medication.  Since being discharged, he believes he is stable.    I had not seen him since 2015 until I saw him in November 2018 for evaluation.  He denied any chest pain but was using marijuana daily and drinking daily beer.  He denied ever using cocaine.  He smokes cigarettes.  He denied exertional chest tightness.   He denied palpitations, PND, orthopnea.  When I saw him, his blood pressure was stable on lisinopril.  I recommended initiation of low-dose Toprol-XL 12.5 mg in light of his underlying CAD.  I recommended initiation of Crestor 40 mg since his LDL was 202, and total cholesterol 262.  We discussed smoking cessation.  He was hospitalized on 02/20/2017 with severe nausea, vomiting, and was found to have a perforated gastric ulcer and was hospitalized for 8 days.  He underwent exploratory laparotomy by Dr. Dwain SarnaWakefield and had an omental patch placed to his gastric ulcer and Stamm gastrostomy.  I last saw him in March 2019 at which time he denied any chest pain PND orthopnea.   He tolerated his exploratory laparotomy without cardiovascular compromise.  He is trying to quit tobacco and has a quit date set.  He is also significantly trying to reduce his marijuana intake.    Over the past year, he has remained stable from a cardiac standpoint.  He now smokes 1 to 2 cigarettes every other day but he smokes marijuana daily.  He denies chest pain.  He also has had some issues with heartburn for which he had taken pantoprazole.  He also has had some panic attacks.  He has bipolar and is on Abilify in addition to trazodone.  He continues to be on rosuvastatin 40 mg for hyperlipidemia.  He continues to be on DAPT with aspirin/Plavix.  He is on lisinopril 5 mg and Toprol-XL 12.5 mg for blood pressure and his CAD.  He presents for yearly evaluation.  Past Medical History:  Diagnosis Date  . Anxiety   .  Bipolar 1 disorder (HCC)   . CAD S/P percutaneous coronary angioplasty    a. 01/2007 Inf STEMI: BMS-> RCA; b. 07/2013 NSTEMI: PTCA->RCA for ISR; c. 01/2014: Inf STEMI->PTCA/DES to pRCA &dRCA; d. 12/2014 Inf STEMI: RCA patent prox stent, 176m/d (3.0x32 Synergy DES); e. 05/2015 Inf STEMI: D1 75, RCA stents ok->Med rx; f. 09/2015 PCI: RCA 136m/d (thrombectomy); g. 07/2016 Cath: LM nl, LAD 40, D1 80, LCX ok, OM1 25, OM2 30, RCA stents ok, RPDA 30; h. 10/2017 MV: No isch.  Marland Kitchen Hx of medication noncompliance   . Hyperlipidemia   . Hypertension   . Hypertensive heart disease   . Ischemic cardiomyopathy    a. EF 40% in 2011, 60% in 2012. b. EF 55% by cath 07/2013. c. EF 50% by cath 01/2014; d. 12/2014 EF 35-45% by LV gram; e. 05/2015 Echo: EF 50-55% inflat/inf AKI; f. 06/2015 cMRI: EF 49%, basal & mid inf full thickness scar, subendocardial scar in antsept and antlat wall, correlating w/ D1 dzs; g. 02/2017 Echo: EF 45-50%, diff HK. Antsep/infsept AK. Mod AI, triv MR.  Marland Kitchen NSVT (nonsustained ventricular tachycardia) (HCC)    a. Very brief run during 07/2013 admit for NSTEMI felt due to MI.  . Polysubstance abuse (HCC)    a. h/o tobacco, marijuana and crack cocaine use. b. 07/2013: +UDS THC, neg for cocaine.  . Schizophrenia (HCC)    pt report   . Tobacco abuse     Past Surgical History:  Procedure Laterality Date  . CARDIAC CATHETERIZATION  01/16/2009   normal left main, Cfx with 2-OMs both w/minor irregularities, LAd with 20-30% mid region irregularities, ramus intermediate/optional diagonal with 60% osital narrowing, RCA with stent in distal portion w/20% prox in-stent stenosis (Dr. Mervyn Skeeters. Little)  . CARDIAC CATHETERIZATION  07/23/2013   two vessel obstructive CAD, occluded first diagonal, focal in-stent restenosis in distal RCA (Dr. Peter Swaziland)  . CARDIAC CATHETERIZATION N/A 01/10/2015   Procedure: Left Heart Cath and Coronary Angiography;  Surgeon: Marykay Lex, MD;  Location: [facility] INVASIVE CV LAB;  Service:  Cardiovascular;  Laterality: N/A;  . CARDIAC CATHETERIZATION N/A 01/10/2015   Procedure: Coronary Stent Intervention;  Surgeon: Marykay Lex, MD;  Location: [facility] INVASIVE CV LAB;  Service: Cardiovascular;  Laterality: N/A;  . CARDIAC CATHETERIZATION N/A 06/10/2015   Procedure: Left Heart Cath and Coronary Angiography;  Surgeon: Peter M Swaziland, MD;  Location: [facility] INVASIVE CV LAB;  Service: Cardiovascular;  Laterality: N/A;  . CARDIAC CATHETERIZATION N/A 09/27/2015   Procedure: Left Heart Cath and Coronary Angiography;  Surgeon: Corky Crafts, MD;  Location: [facility] INVASIVE CV LAB;  Service: Cardiovascular;  Laterality: N/A;  . CARDIAC CATHETERIZATION N/A 09/27/2015   Procedure: Coronary Balloon Angioplasty;  Surgeon: Corky Crafts, MD;  Location: [facility] INVASIVE CV LAB;  Service: Cardiovascular;  Laterality: N/A;  . CORONARY ANGIOPLASTY WITH STENT PLACEMENT  02/05/2007   3.5x29mm Quantum non-DES to RCA (Dr. Nicki Guadalajara)  .  FEMORAL ARTERY STENT    . LAPAROTOMY N/A 02/20/2017   Procedure: EXPLORATORY LAPAROTOMY AND REPAIR, PATCH, OR REMOVE PERFORATION IN BOWEL;  Surgeon: Emelia Loron, MD;  Location: [facility] OR;  Service: General;  Laterality: N/A;  . LEFT AND RIGHT HEART CATHETERIZATION WITH CORONARY ANGIOGRAM N/A 01/14/2014   Procedure: LEFT AND RIGHT HEART CATHETERIZATION WITH CORONARY ANGIOGRAM;  Surgeon: Kathleene Hazel, MD;  Location: [facility] CATH LAB;  Service: Cardiovascular;  Laterality: N/A;  . LEFT HEART CATH AND CORONARY ANGIOGRAPHY N/A 07/12/2016   Procedure: Left Heart Cath and Coronary Angiography;  Surgeon: Swaziland, Peter M, MD;  Location: [facility] INVASIVE CV LAB;  Service: Cardiovascular;  Laterality: N/A;  . LEFT HEART CATHETERIZATION WITH CORONARY ANGIOGRAM N/A 07/23/2013   Procedure: LEFT HEART CATHETERIZATION WITH CORONARY ANGIOGRAM;  Surgeon: Peter M Swaziland, MD;  Location: [facility] CATH LAB;  Service: Cardiovascular;  Laterality: N/A;    Allergies  Allergen Reactions  . Iodine Anaphylaxis  and Swelling  . Shellfish Allergy Anaphylaxis    All shellfish  . Contrast Media [Iodinated Diagnostic Agents] Nausea And Vomiting    Current Outpatient Medications  Medication Sig Dispense Refill  . ARIPiprazole (ABILIFY) 15 MG tablet Take 1 tablet (15 mg total) by mouth daily. 14 tablet 0  . aspirin EC 81 MG tablet Take 81 mg daily by mouth.    . clopidogrel (PLAVIX) 75 MG tablet Take 1 tablet (75 mg total) by mouth daily. 90 tablet 3  . lisinopril (ZESTRIL) 5 MG tablet Take 1 tablet (5 mg total) by mouth daily. 90 tablet 3  . metoprolol succinate (TOPROL-XL) 25 MG 24 hr tablet Take 0.5 tablets (12.5 mg total) by mouth daily. KEEP OV. 45 tablet 3  . nitroGLYCERIN (NITROSTAT) 0.4 MG SL tablet Place 1 tablet (0.4 mg total) under the tongue every 5 (five) minutes x 3 doses as needed for chest pain. 25 tablet 2  . rosuvastatin (CRESTOR) 40 MG tablet Take 1 tablet (40 mg total) by mouth daily. 90 tablet 3  . traZODone (DESYREL) 100 MG tablet Take 1 tablet (100 mg total) by mouth at bedtime as needed for sleep. 14 tablet 0   No current facility-administered medications for this visit.     Social History   Socioeconomic History  . Marital status: Single    Spouse name: Not on file  . Number of children: Not on file  . Years of education: Not on file  . Highest education level: Not on file  Occupational History  . Not on file  Social Needs  . Financial resource strain: Not on file  . Food insecurity    Worry: Not on file    Inability: Not on file  . Transportation needs    Medical: Not on file    Non-medical: Not on file  Tobacco Use  . Smoking status: Current Some Day Smoker    Packs/day: 1.00    Years: 30.00    Pack years: 30.00    Types: Cigarettes  . Smokeless tobacco: Never Used  . Tobacco comment: pt states every 2 days  Substance and Sexual Activity  . Alcohol use: Yes    Alcohol/week: 0.0 standard drinks    Comment: 40 oz  . Drug use: Yes    Types: Marijuana     Comment: current user- daily one-two blunts  . Sexual activity: Not Currently  Lifestyle  . Physical activity    Days per week: Not on file    Minutes per session: Not on file  .  Stress: Not on file  Relationships  . Social Musician on phone: Not on file    Gets together: Not on file    Attends religious service: Not on file    Active member of club or organization: Not on file    Attends meetings of clubs or organizations: Not on file    Relationship status: Not on file  . Intimate partner violence    Fear of current or ex partner: Not on file    Emotionally abused: Not on file    Physically abused: Not on file    Forced sexual activity: Not on file  Other Topics Concern  . Not on file  Social History Narrative   Lives locally with girlfriend.  Does not routinely exercise.    Family History  Problem Relation Age of Onset  . CAD Mother   . Heart disease Mother   . Heart attack Mother   . Schizophrenia Mother   . CAD Sister     ROS General: Negative; No fevers, chills, or night sweats HEENT: Negative; No changes in vision or hearing, sinus congestion, difficulty swallowing Pulmonary: Negative; No cough, wheezing, shortness of breath, hemoptysis Cardiovascular: See HPI:  GI: Negative; No nausea, vomiting, diarrhea, or abdominal pain GU: Negative; No dysuria, hematuria, or difficulty voiding Musculoskeletal: Negative; no myalgias, joint pain, or weakness Hematologic: Negative; no easy bruising, bleeding Endocrine: Negative; no heat/cold intolerance; no diabetes, Neuro: Negative; no changes in balance, headaches Skin: Negative; No rashes or skin lesions Psychiatric: History of bipolar disorder.  History of substance abuse.  Prior hospitalization when off bipolar medications for suicidal ideation; panic attacks Sleep: Negative; No snoring,  daytime sleepiness, hypersomnolence, bruxism, restless legs, hypnogognic hallucinations. Other comprehensive 14 point  system review is negative   Physical Exam BP 118/80   Pulse (!) 56   Ht 6' (1.829 m)   Wt 185 lb (83.9 kg)   BMI 25.09 kg/m    Repeat blood pressure by me was 110/70 supine and 108/78 standing.  Wt Readings from Last 3 Encounters:  12/15/18 185 lb (83.9 kg)  02/12/18 173 lb 8 oz (78.7 kg)  11/03/17 182 lb 15.7 oz (83 kg)   General: Alert, oriented, no distress.  Skin: normal turgor, no rashes, warm and dry HEENT: Normocephalic, atraumatic. Pupils equal round and reactive to light; sclera anicteric; extraocular muscles intact;  Nose without nasal septal hypertrophy Mouth/Parynx benign; Mallinpatti scale 2 Neck: No JVD, no carotid bruits; normal carotid upstroke Lungs: clear to ausculatation and percussion; no wheezing or rales Chest wall: without tenderness to palpitation Heart: PMI not displaced, RRR, s1 s2 normal, 1/6 systolic murmur, no diastolic murmur, no rubs, gallops, thrills, or heaves Abdomen: soft, nontender; no hepatosplenomehaly, BS+; abdominal aorta nontender and not dilated by palpation. Back: no CVA tenderness Pulses 2+ Musculoskeletal: full range of motion, normal strength, no joint deformities Extremities: no clubbing cyanosis or edema, Homan's sign negative  Neurologic: grossly nonfocal; Cranial nerves grossly wnl Psychologic: Normal mood and affect   ECG (independently read by me): Sinus bradycardia at 56 bpm, incomplete right bundle branch block.  Old inferior infarct with Q waves 2 3 and F.  Normal intervals.  No ectopy  March 2019 ECG (independently read by me): Normal sinus rhythm at 70 bpm.  Incomplete right bundle branch block, inferior infarction with Q waves in persistent T-wave inversion with preserved R waves, early repolarization  November 2018 ECG (independently read by me):Normal sinus rhythm at 67 bpm.  Inferior  Q waves concordant with old inferior MI, but preserved R waves.  Incomplete right bundle branch block.  Normal intervals.  No ectopy.   August 2015 ECG (independently read by me): Normal sinus rhythm  LABS:  BMET    Component Value Date/Time   NA 135 07/04/2018 1028   K 4.4 07/04/2018 1028   CL 106 07/04/2018 1028   CO2 17 (L) 07/04/2018 1028   GLUCOSE 86 07/04/2018 1028   BUN 5 (L) 07/04/2018 1028   CREATININE 1.42 (H) 07/04/2018 1028   CALCIUM 9.3 07/04/2018 1028   GFRNONAA >60 07/04/2018 1028   GFRAA >60 07/04/2018 1028     Hepatic Function Panel     Component Value Date/Time   PROT 7.7 07/04/2018 1028   ALBUMIN 4.2 07/04/2018 1028   AST 26 07/04/2018 1028   ALT 27 07/04/2018 1028   ALKPHOS 84 07/04/2018 1028   BILITOT 0.6 07/04/2018 1028   BILIDIR 0.1 07/04/2018 1028   IBILI 0.5 07/04/2018 1028     CBC    Component Value Date/Time   WBC 5.4 07/04/2018 1028   RBC 4.78 07/04/2018 1028   HGB 14.4 07/04/2018 1028   HCT 43.5 07/04/2018 1028   PLT 268 07/04/2018 1028   MCV 91.0 07/04/2018 1028   MCH 30.1 07/04/2018 1028   MCHC 33.1 07/04/2018 1028   RDW 17.0 (H) 07/04/2018 1028   LYMPHSABS 1.4 02/28/2017 0302   MONOABS 0.3 02/28/2017 0302   EOSABS 0.1 02/28/2017 0302   BASOSABS 0.0 02/28/2017 0302     BNP    Component Value Date/Time   PROBNP 51.6 07/21/2013 0530    Lipid Panel     Component Value Date/Time   CHOL 169 11/03/2017 0322   TRIG 78 11/03/2017 0322   HDL 40 (L) 11/03/2017 0322   CHOLHDL 4.2 11/03/2017 0322   VLDL 16 11/03/2017 0322   LDLCALC 113 (H) 11/03/2017 0322     RADIOLOGY: No results found.  IMPRESSION: 1. CAD S/P BMS PCI to RCA  2009 with PTCA for ISR --> followed by stent thrombosis - DES PCI   2. Hyperlipidemia with target LDL less than 70   3. Essential hypertension   4. Ischemic cardiomyopathy   5. Marijuana use   6. Bipolar affective disorder, remission status unspecified ([place])     ASSESSMENT AND PLAN: Mr. Mccamy is a 43year-old African-American gentleman who suffered an initial MI in January 2009 treated with bare-metal stenting to his RCA.   Ejection fraction at that time was  40%.  He suffered a NSTEMI in 2015 and was found to have high-grade in-stent restenosis to his RCA, as well as an occluded diagonal vessel.  Ejection fraction was 55% by catheterization.   He has undergone 4 additional cardiac catheterizations with his most recent being in July 2018.  When I saw him in November 2018 he was smoking cigarettes , drinking beer and using marijuana.  He denied ever using cocaine. Due to his recurrent need for catheterizations and restenosis I recommended starting back on DAPT and also was started on high-dose rosuvastatin and low-dose lisinopril.   He developed gastric ulcer and required surgery on 02/20/2017.  Over the past year, his GI status has been stable.  He continues to be on DAPT.  He has not had any recurrent anginal symptomatology.  He has significantly weaned his tobacco use and currently smokes 1 to 2 cigarettes every other day.  However he has daily marijuana use.  He has not had recent laboratory  and a complete set of fasting laboratory will be done.  Target LDL is less than 70.  He continues to be on rosuvastatin 40 mg.  Blood pressure today is well controlled without orthostatic drop on lisinopril 5 mg and Toprol-XL 12.5 mg.  ECG remained stable with sinus bradycardia 56 bpm and incomplete right bundle branch block and evidence for his old inferior MI.  I will contact him regarding results of laboratory and adjustments to his medical regimen will be made if necessary.  He continues to be on Abilify and Desyrel for his bipolar disorder.  At times he admits to some panic attacks.  I will see him in 1 year for reevaluation or sooner as needed.  Time spent: 25 minutes  Lennette Bihari, MD, [facility]  12/17/2018 10:05 AM

## 2018-12-15 NOTE — Patient Instructions (Addendum)
Medication Instructions:  NO CHANGES   MEDICATION REFILLED  *If you need a refill on your cardiac medications before your next appointment, please call your pharmacy*  Lab Work: CMP CBC LIPID TSH If you have labs (blood work) drawn today and your tests are completely normal, you will receive your results only by: Marland Kitchen MyChart Message (if you have MyChart) OR . A paper copy in the mail If you have any lab test that is abnormal or we need to change your treatment, we will call you to review the results.  Testing/Procedures: NOT NEEDED  Follow-Up: At [facility], you and your health needs are our priority.  As part of our continuing mission to provide you with exceptional heart care, we have created designated Provider Care Teams.  These Care Teams include your primary Cardiologist (physician) and Advanced Practice Providers (APPs -  Physician Assistants and Nurse Practitioners) who all work together to provide you with the care you need, when you need it.  Your next appointment:   12 month(s)  The format for your next appointment:   In Person  Provider:   Shelva Majestic, MD  Other Instructions

## 2018-12-17 ENCOUNTER — Encounter: Payer: Self-pay | Admitting: Cardiovascular Disease

## 2019-12-17 ENCOUNTER — Encounter: Payer: Self-pay | Admitting: Cardiovascular Disease

## 2019-12-17 ENCOUNTER — Ambulatory Visit (INDEPENDENT_AMBULATORY_CARE_PROVIDER_SITE_OTHER): Payer: Medicare Other | Admitting: Cardiovascular Disease

## 2019-12-17 ENCOUNTER — Other Ambulatory Visit: Payer: Self-pay

## 2019-12-17 DIAGNOSIS — I2510 Atherosclerotic heart disease of native coronary artery without angina pectoris: Secondary | ICD-10-CM | POA: Diagnosis not present

## 2019-12-17 DIAGNOSIS — Z9861 Coronary angioplasty status: Secondary | ICD-10-CM

## 2019-12-17 DIAGNOSIS — F319 Bipolar disorder, unspecified: Secondary | ICD-10-CM

## 2019-12-17 DIAGNOSIS — Z720 Tobacco use: Secondary | ICD-10-CM

## 2019-12-17 DIAGNOSIS — I10 Essential (primary) hypertension: Secondary | ICD-10-CM

## 2019-12-17 DIAGNOSIS — E785 Hyperlipidemia, unspecified: Secondary | ICD-10-CM | POA: Diagnosis not present

## 2019-12-17 MED ORDER — NITROGLYCERIN 0.4 MG SL SUBL: 0.4000 mg | SUBLINGUAL_TABLET | SUBLINGUAL | 2 refills | Status: DC | PRN

## 2019-12-17 MED ORDER — ROSUVASTATIN CALCIUM 40 MG PO TABS: 40.0000 mg | ORAL_TABLET | Freq: Every day | ORAL | 3 refills | Status: DC

## 2019-12-17 MED ORDER — METOPROLOL SUCCINATE ER 25 MG PO TB24: 12.5000 mg | ORAL_TABLET | Freq: Every day | ORAL | 3 refills | Status: DC

## 2019-12-17 MED ORDER — CLOPIDOGREL BISULFATE 75 MG PO TABS: 75.0000 mg | ORAL_TABLET | Freq: Every day | ORAL | 3 refills | Status: DC

## 2019-12-17 MED ORDER — LISINOPRIL 5 MG PO TABS: 5.0000 mg | ORAL_TABLET | Freq: Every day | ORAL | 3 refills | Status: DC

## 2019-12-17 NOTE — Patient Instructions (Signed)
Medication Instructions:  No changes *If you need a refill on your cardiac medications before your next appointment, please call your pharmacy*   Lab Work: TSH, CBC, CMP and lipds in our office today If you have labs (blood work) drawn today and your tests are completely normal, you will receive your results only by: Marland Kitchen MyChart Message (if you have MyChart) OR . A paper copy in the mail If you have any lab test that is abnormal or we need to change your treatment, we will call you to review the results.   Testing/Procedures: None ordered   Follow-Up: At [facility], you and your health needs are our priority.  As part of our continuing mission to provide you with exceptional heart care, we have created designated Provider Care Teams.  These Care Teams include your primary Cardiologist (physician) and Advanced Practice Providers (APPs -  Physician Assistants and Nurse Practitioners) who all work together to provide you with the care you need, when you need it.  We recommend signing up for the patient portal called "MyChart".  Sign up information is provided on this After Visit Summary.  MyChart is used to connect with patients for Virtual Visits (Telemedicine).  Patients are able to view lab/test results, encounter notes, upcoming appointments, etc.  Non-urgent messages can be sent to your provider as well.   To learn more about what you can do with MyChart, go to [email or web address].    Your next appointment:   12 month(s)  The format for your next appointment:   In Person  Provider:   Nicki Guadalajara, MD  Your physician wants you to follow-up in: 12 months. You will receive a reminder letter in the mail two months in advance. If you don't receive a letter, please call our office to schedule the follow-up appointment.   Other Instructions None

## 2019-12-17 NOTE — Progress Notes (Signed)
Patient ID: [name], male   DOB: 12-16-1975, 44 y.o.   MRN: [id number]     HPI: [name] is a 44 y.o. male who presents to the office today for a 12 month follow up cardiology evaluation.    [name] has a history of CAD and suffered an inferior wall ST segment elevation myocardial infarction treated with bare-metal stenting to his RCA in 2009.  He has history of hypertension, hyperlipidemia,bipolar disorder, tobacco use, and marijuana use.  In the medical records there is indication of possible crack cocaine use, but the patient vehemently denies this.  He was admitted to [facility] on 07/21/2013 with squeezing chest pressure and ruled in for a non-STEMI with a peak troponin of 16.  He had transient nonsustained VT on telemetry, related to his infarct.  He was premedicated for possible dye allergy, and underwent cardiac catheterization by Dr. Swaziland, which revealed occlusion of the first diagonal vessel, and focal restenosis within the previously placed distal RCA stent.  This was treated with 3.5 x 10 mm cutting balloon intervention to the in-stent restenosis with plans for medical therapy for the diagonal occlusion.  He had mild 20% stenoses in the LAD, and in addition to occlusion of the first diagonal vessel, the second diagonal vessel, 70% stenosis at the ostium.  The circumflex vessel had 50% stenosis in the midportion.  The proximal RCA had disease of 30% and had 80-90% focal in-stent restenosis.  Subsequently, he has undergone catheterization in 2016 by Dr. Herbie Baltimore, in May 2017 by Dr. Swaziland and September 2017 by Dr. Eldridge Dace and his last catheterization was in July 2018 by Dr. Swaziland.  At his last catheterization he was found to have single-vessel obstruction involving a small diagonal branch, with mild stenoses in the LAD and circumflex with continued patency of stents in the proximal and distal RCA.  He had mild-to-moderate LV dysfunction with an EF of 45% and mild inferior  akinesis.  He was hospitalized on the psychiatry service with suicidal ideaion when he was off his bipolar medication.  Since being discharged, he believes he is stable.    I had not seen him since 2015 until I saw him in November 2018 for evaluation.  He denied any chest pain but was using marijuana daily and drinking daily beer.  He denied ever using cocaine.  He smokes cigarettes.  He denied exertional chest tightness.   He denied palpitations, PND, orthopnea.  When I saw him, his blood pressure was stable on lisinopril.  I recommended initiation of low-dose Toprol-XL 12.5 mg in light of his underlying CAD.  I recommended initiation of Crestor 40 mg since his LDL was 202, and total cholesterol 262.  We discussed smoking cessation.  He was hospitalized on 02/20/2017 with severe nausea, vomiting, and was found to have a perforated gastric ulcer and was hospitalized for 8 days.  He underwent exploratory laparotomy by Dr. Dwain Sarna and had an omental patch placed to his gastric ulcer and Stamm gastrostomy.  I  saw him in March 2019 at which time he denied any chest pain PND orthopnea.   He tolerated his exploratory laparotomy without cardiovascular compromise.  He is trying to quit tobacco and has a quit date set.  He is also significantly trying to reduce his marijuana intake.    I last saw him in December 2020.  Over the prior year he   remained stable from a cardiac standpoint.  He now smokes 1 to 2 cigarettes  every other day but he smokes marijuana daily.  He denies chest pain.  He also has had some issues with heartburn for which he had taken pantoprazole.  He also has had some panic attacks.  He has abipolar disorder and is on Abilify in addition to trazodone.  He continues to be on rosuvastatin 40 mg for hyperlipidemia.  He continues to be on DAPT with aspirin/Plavix.  He is on lisinopril 5 mg and Toprol-XL 12.5 mg for blood pressure and his CAD.    Since I last saw him he has continued to be stable.   He specifically denies chest pain PND or orthopnea.  He walks minutes twice a day without chest pain or shortness of breath.  He has not had recent laboratory.  He states he is still smoking approximately 5 cigarettes/day.  He feels well.  He continues to be on DAPT with aspirin/Plavix.  He is on lisinopril 5 mg, Toprol-XL 12.5 mg, and rosuvastatin 40 mg daily.  He continues to be on Abilify and does not recall for his bipolar disorder.  He presents for yearly evaluation.   Past Medical History:  Diagnosis Date  . Anxiety   . Bipolar 1 disorder (HCC)   . CAD S/P percutaneous coronary angioplasty    a. 01/2007 Inf STEMI: BMS-> RCA; b. 07/2013 NSTEMI: PTCA->RCA for ISR; c. 01/2014: Inf STEMI->PTCA/DES to pRCA &dRCA; d. 12/2014 Inf STEMI: RCA patent prox stent, 152m/d (3.0x32 Synergy DES); e. 05/2015 Inf STEMI: D1 75, RCA stents ok->Med rx; f. 09/2015 PCI: RCA 110m/d (thrombectomy); g. 07/2016 Cath: LM nl, LAD 40, D1 80, LCX ok, OM1 25, OM2 30, RCA stents ok, RPDA 30; h. 10/2017 MV: No isch.  Marland Kitchen Hx of medication noncompliance   . Hyperlipidemia   . Hypertension   . Hypertensive heart disease   . Ischemic cardiomyopathy    a. EF 40% in 2011, 60% in 2012. b. EF 55% by cath 07/2013. c. EF 50% by cath 01/2014; d. 12/2014 EF 35-45% by LV gram; e. 05/2015 Echo: EF 50-55% inflat/inf AKI; f. 06/2015 cMRI: EF 49%, basal & mid inf full thickness scar, subendocardial scar in antsept and antlat wall, correlating w/ D1 dzs; g. 02/2017 Echo: EF 45-50%, diff HK. Antsep/infsept AK. Mod AI, triv MR.  Marland Kitchen NSVT (nonsustained ventricular tachycardia) (HCC)    a. Very brief run during 07/2013 admit for NSTEMI felt due to MI.  . Polysubstance abuse (HCC)    a. h/o tobacco, marijuana and crack cocaine use. b. 07/2013: +UDS THC, neg for cocaine.  . Schizophrenia (HCC)    pt report   . Tobacco abuse     Past Surgical History:  Procedure Laterality Date  . CARDIAC CATHETERIZATION  01/16/2009   normal left main, Cfx with 2-OMs both  w/minor irregularities, LAd with 20-30% mid region irregularities, ramus intermediate/optional diagonal with 60% osital narrowing, RCA with stent in distal portion w/20% prox in-stent stenosis (Dr. Mervyn Skeeters. Little)  . CARDIAC CATHETERIZATION  07/23/2013   two vessel obstructive CAD, occluded first diagonal, focal in-stent restenosis in distal RCA (Dr. Peter Swaziland)  . CARDIAC CATHETERIZATION N/A 01/10/2015   Procedure: Left Heart Cath and Coronary Angiography;  Surgeon: Marykay Lex, MD;  Location: [facility] INVASIVE CV LAB;  Service: Cardiovascular;  Laterality: N/A;  . CARDIAC CATHETERIZATION N/A 01/10/2015   Procedure: Coronary Stent Intervention;  Surgeon: Marykay Lex, MD;  Location: [facility] INVASIVE CV LAB;  Service: Cardiovascular;  Laterality: N/A;  . CARDIAC CATHETERIZATION N/A 06/10/2015   Procedure:  Left Heart Cath and Coronary Angiography;  Surgeon: Peter M SwazilandJordan, MD;  [facility];  Service: Cardiovascular;  Laterality: N/A;  . CARDIAC CATHETERIZATION N/A 09/27/2015   Procedure: Left Heart Cath and Coronary Angiography;  Surgeon: Corky CraftsJayadeep S Varanasi, MD;  [facility];  Service: Cardiovascular;  Laterality: N/A;  . CARDIAC CATHETERIZATION N/A 09/27/2015   Procedure: Coronary Balloon Angioplasty;  Surgeon: Corky CraftsJayadeep S Varanasi, MD;  Location: [facility] INVASIVE CV LAB;  Service: Cardiovascular;  Laterality: N/A;  . CORONARY ANGIOPLASTY WITH STENT PLACEMENT  02/05/2007   3.5x2718mm Quantum non-DES to RCA (Dr. Nicki Guadalajarahomas Cherysh Epperly)  . FEMORAL ARTERY STENT    . LAPAROTOMY N/A 02/20/2017   Procedure: EXPLORATORY LAPAROTOMY AND REPAIR, PATCH, OR REMOVE PERFORATION IN BOWEL;  Surgeon: Emelia LoronWakefield, Matthew, MD;  Location: [facility] OR;  Service: General;  Laterality: N/A;  . LEFT AND RIGHT HEART CATHETERIZATION WITH CORONARY ANGIOGRAM N/A 01/14/2014   Procedure: LEFT AND RIGHT HEART CATHETERIZATION WITH CORONARY ANGIOGRAM;  Surgeon: Kathleene Hazelhristopher D McAlhany, MD;  [facility];  Service:  Cardiovascular;  Laterality: N/A;  . LEFT HEART CATH AND CORONARY ANGIOGRAPHY N/A 07/12/2016   Procedure: Left Heart Cath and Coronary Angiography;  Surgeon: SwazilandJordan, Peter M, MD;  [facility];  Service: Cardiovascular;  Laterality: N/A;  . LEFT HEART CATHETERIZATION WITH CORONARY ANGIOGRAM N/A 07/23/2013   Procedure: LEFT HEART CATHETERIZATION WITH CORONARY ANGIOGRAM;  Surgeon: Peter M SwazilandJordan, MD;  [facility];  Service: Cardiovascular;  Laterality: N/A;    Allergies  Allergen Reactions  . Iodine Anaphylaxis and Swelling  . Shellfish Allergy Anaphylaxis    All shellfish  . Contrast Media [Iodinated Diagnostic Agents] Nausea And Vomiting    Current Outpatient Medications  Medication Sig Dispense Refill  . ARIPiprazole (ABILIFY) 15 MG tablet Take 1 tablet (15 mg total) by mouth daily. 14 tablet 0  . aspirin EC 81 MG tablet Take 81 mg daily by mouth.    . clopidogrel (PLAVIX) 75 MG tablet Take 1 tablet (75 mg total) by mouth daily. 90 tablet 3  . lisinopril (ZESTRIL) 5 MG tablet Take 1 tablet (5 mg total) by mouth daily. 90 tablet 3  . metoprolol succinate (TOPROL-XL) 25 MG 24 hr tablet Take 0.5 tablets (12.5 mg total) by mouth daily. 45 tablet 3  . nitroGLYCERIN (NITROSTAT) 0.4 MG SL tablet Place 1 tablet (0.4 mg total) under the tongue every 5 (five) minutes x 3 doses as needed for chest pain. 25 tablet 2  . rosuvastatin (CRESTOR) 40 MG tablet Take 1 tablet (40 mg total) by mouth daily. 90 tablet 3  . traZODone (DESYREL) 100 MG tablet Take 1 tablet (100 mg total) by mouth at bedtime as needed for sleep. 14 tablet 0   No current facility-administered medications for this visit.    Social History   Socioeconomic History  . Marital status: Single    Spouse name: Not on file  . Number of children: Not on file  . Years of education: Not on file  . Highest education level: Not on file  Occupational History  . Not on file  Tobacco Use  . Smoking status: Current  Some Day Smoker    Packs/day: 1.00    Years: 30.00    Pack years: 30.00    Types: Cigarettes  . Smokeless tobacco: Never Used  . Tobacco comment: pt states every 2 days  Vaping Use  . Vaping Use: Never used  Substance and Sexual Activity  .  Alcohol use: Yes    Alcohol/week: 0.0 standard drinks    Comment: 40 oz  . Drug use: Yes    Types: Marijuana    Comment: current user- daily one-two blunts  . Sexual activity: Not Currently  Other Topics Concern  . Not on file  Social History Narrative   Lives locally with girlfriend.  Does not routinely exercise.   Social Determinants of Health   Financial Resource Strain:   . Difficulty of Paying Living Expenses: Not on file  Food Insecurity:   . Worried About Programme researcher, broadcasting/film/video in the Last Year: Not on file  . Ran Out of Food in the Last Year: Not on file  Transportation Needs:   . Lack of Transportation (Medical): Not on file  . Lack of Transportation (Non-Medical): Not on file  Physical Activity:   . Days of Exercise per Week: Not on file  . Minutes of Exercise per Session: Not on file  Stress:   . Feeling of Stress : Not on file  Social Connections:   . Frequency of Communication with Friends and Family: Not on file  . Frequency of Social Gatherings with Friends and Family: Not on file  . Attends Religious Services: Not on file  . Active Member of Clubs or Organizations: Not on file  . Attends Banker Meetings: Not on file  . Marital Status: Not on file  Intimate Partner Violence:   . Fear of Current or Ex-Partner: Not on file  . Emotionally Abused: Not on file  . Physically Abused: Not on file  . Sexually Abused: Not on file    Family History  Problem Relation Age of Onset  . CAD Mother   . Heart disease Mother   . Heart attack Mother   . Schizophrenia Mother   . CAD Sister     ROS General: Negative; No fevers, chills, or night sweats HEENT: Negative; No changes in vision or hearing, sinus  congestion, difficulty swallowing Pulmonary: Negative; No cough, wheezing, shortness of breath, hemoptysis Cardiovascular: See HPI:  GI: Negative; No nausea, vomiting, diarrhea, or abdominal pain GU: Negative; No dysuria, hematuria, or difficulty voiding Musculoskeletal: Negative; no myalgias, joint pain, or weakness Hematologic: Negative; no easy bruising, bleeding Endocrine: Negative; no heat/cold intolerance; no diabetes, Neuro: Negative; no changes in balance, headaches Skin: Negative; No rashes or skin lesions Psychiatric: History of bipolar disorder.  History of substance abuse.  Prior hospitalization when off bipolar medications for suicidal ideation; panic attacks Sleep: Negative; No snoring,  daytime sleepiness, hypersomnolence, bruxism, restless legs, hypnogognic hallucinations. Other comprehensive 14 point system review is negative   Physical Exam BP 130/73   Pulse 70   Ht 6' (1.829 m)   Wt 193 lb 6.4 oz (87.7 kg)   SpO2 99%   BMI 26.23 kg/m    Repeat blood pressure by me was 120/70  Wt Readings from Last 3 Encounters:  12/17/19 193 lb 6.4 oz (87.7 kg)  12/15/18 185 lb (83.9 kg)  02/12/18 173 lb 8 oz (78.7 kg)   General: Alert, oriented, no distress.  Skin: normal turgor, no rashes, warm and dry HEENT: Normocephalic, atraumatic. Pupils equal round and reactive to light; sclera anicteric; extraocular muscles intact; Nose without nasal septal hypertrophy Mouth/Parynx benign; Mallinpatti scale 2 Neck: No JVD, no carotid bruits; normal carotid upstroke Lungs: clear to ausculatation and percussion; no wheezing or rales Chest wall: without tenderness to palpitation Heart: PMI not displaced, RRR, s1 s2 normal, 1/6 systolic murmur, no  diastolic murmur, no rubs, gallops, thrills, or heaves Abdomen: soft, nontender; no hepatosplenomehaly, BS+; abdominal aorta nontender and not dilated by palpation. Back: no CVA tenderness Pulses 2+ Musculoskeletal: full range of motion,  normal strength, no joint deformities Extremities: no clubbing cyanosis or edema, Homan's sign negative  Neurologic: grossly nonfocal; Cranial nerves grossly wnl Psychologic: Normal mood and affect   ECG (independently read by me): Normal sinus rhythm at 70 bpm.  Inferior Q waves 2 3 and F with T wave inversion consistent with old inferior MI.  R waves are preserved. Early transition  December 2020 CG (independently read by me): Sinus bradycardia at 56 bpm, incomplete right bundle branch block.  Old inferior infarct with Q waves 2 3 and F.  Normal intervals.  No ectopy  March 2019 ECG (independently read by me): Normal sinus rhythm at 70 bpm.  Incomplete right bundle branch block, inferior infarction with Q waves in persistent T-wave inversion with preserved R waves, early repolarization  November 2018 ECG (independently read by me):Normal sinus rhythm at 67 bpm.  Inferior Q waves concordant with old inferior MI, but preserved R waves.  Incomplete right bundle branch block.  Normal intervals.  No ectopy.  August 2015 ECG (independently read by me): Normal sinus rhythm  LABS:  BMET    Component Value Date/Time   NA 135 07/04/2018 1028   K 4.4 07/04/2018 1028   CL 106 07/04/2018 1028   CO2 17 (L) 07/04/2018 1028   GLUCOSE 86 07/04/2018 1028   BUN 5 (L) 07/04/2018 1028   CREATININE 1.42 (H) 07/04/2018 1028   CALCIUM 9.3 07/04/2018 1028   GFRNONAA >60 07/04/2018 1028   GFRAA >60 07/04/2018 1028     Hepatic Function Panel     Component Value Date/Time   PROT 7.7 07/04/2018 1028   ALBUMIN 4.2 07/04/2018 1028   AST 26 07/04/2018 1028   ALT 27 07/04/2018 1028   ALKPHOS 84 07/04/2018 1028   BILITOT 0.6 07/04/2018 1028   BILIDIR 0.1 07/04/2018 1028   IBILI 0.5 07/04/2018 1028     CBC    Component Value Date/Time   WBC 5.4 07/04/2018 1028   RBC 4.78 07/04/2018 1028   HGB 14.4 07/04/2018 1028   HCT 43.5 07/04/2018 1028   PLT 268 07/04/2018 1028   MCV 91.0 07/04/2018 1028     MCH 30.1 07/04/2018 1028   MCHC 33.1 07/04/2018 1028   RDW 17.0 (H) 07/04/2018 1028   LYMPHSABS 1.4 02/28/2017 0302   MONOABS 0.3 02/28/2017 0302   EOSABS 0.1 02/28/2017 0302   BASOSABS 0.0 02/28/2017 0302     BNP    Component Value Date/Time   PROBNP 51.6 07/21/2013 0530    Lipid Panel     Component Value Date/Time   CHOL 169 11/03/2017 0322   TRIG 78 11/03/2017 0322   HDL 40 (L) 11/03/2017 0322   CHOLHDL 4.2 11/03/2017 0322   VLDL 16 11/03/2017 0322   LDLCALC 113 (H) 11/03/2017 0322     RADIOLOGY: No results found.  IMPRESSION: 1. CAD S/P BMS PCI to RCA  2009 with PTCA for ISR --> followed by stent thrombosis - DES PCI   2. Hyperlipidemia with target LDL less than 70   3. Essential hypertension   4. Bipolar affective disorder, remission status unspecified (HCC)   5. Tobacco use     ASSESSMENT AND PLAN: Mr. Nephew is a 43 year-old African-American gentleman who suffered an initial MI in January 2009 treated with bare-metal stenting to  his RCA.  Ejection fraction at that time was  40%.  He suffered a NSTEMI in 2015 and was found to have high-grade in-stent restenosis to his RCA, as well as an occluded diagonal vessel.  Ejection fraction was 55% by catheterization.   He has undergone 4 additional cardiac catheterizations with his last in July 2018.  When I saw him in November 2018 he was smoking cigarettes , drinking beer and using marijuana.  He denied ever using cocaine. Due to his recurrent need for catheterizations and restenosis I recommended starting back on DAPT and also was started on high-dose rosuvastatin and low-dose lisinopril.   He developed gastric ulcer and required surgery on 02/20/2017.  Presently, he feels well.  He is without anginal symptomatology.  His blood pressure today is stable.  He continues to be on lisinopril 5 mg and Toprol-XL 12.5 mg.  ECG shows sinus rhythm at 70 bpm with his old inferior infarct but he does have preserved R waves concordant  with myocardial salvage.  He continues to be on rosuvastatin 40 mg for hyperlipidemia.  He continues to be on DAPT without bleeding.  His bipolar disorder has been stable on his current dose of Abilify and Desyrel.  He is fasting today.  I will check a complete set of fasting laboratory with a comprehensive metabolic panel, CBC, TSH and lipid studies.  Smoking cessation was advised.  I will contact him regarding laboratory results and adjustments to his medications will be made if necessary.  As long as he is stable, I will see him in 1 year for reevaluation or sooner if needed.   Lennette Bihari, MD, [facility]  12/17/2019 9:36 AM

## 2019-12-20 NOTE — Addendum Note (Signed)
Addended by: Felecia Jan on: 12/20/2019 04:19 PM   Modules accepted: Orders

## 2020-01-25 ENCOUNTER — Other Ambulatory Visit: Payer: Self-pay | Admitting: Cardiovascular Disease

## 2021-02-17 ENCOUNTER — Other Ambulatory Visit: Payer: Self-pay | Admitting: Cardiovascular Disease

## 2021-02-18 ENCOUNTER — Other Ambulatory Visit: Payer: Self-pay | Admitting: Cardiovascular Disease

## 2021-02-18 NOTE — Telephone Encounter (Signed)
Patient is requesting a 90 day supply for all four of the medications requested over the past two days. He is completely out of the medicine and has been scheduled for the first available at the [facility] office on 03/10.

## 2021-02-19 ENCOUNTER — Other Ambulatory Visit: Payer: Self-pay | Admitting: Cardiovascular Disease

## 2021-02-20 ENCOUNTER — Telehealth: Payer: Self-pay | Admitting: Nurse Practitioner

## 2021-02-20 NOTE — Telephone Encounter (Signed)
Patient is calling stating his appointment scheduled for 03/10 needs to be virtual if possible. 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 If virtual is not possible he will need to reschedule around the timing of a home health nurse being there. Patient has not been seen since 2021 and only received enough medication to last him until his appt day. Please advise.

## 2021-02-20 NOTE — Telephone Encounter (Signed)
LMTCB

## 2021-02-25 NOTE — Telephone Encounter (Signed)
LMTCB

## 2021-03-03 NOTE — Telephone Encounter (Signed)
LMTCB

## 2021-03-06 NOTE — Telephone Encounter (Signed)
LMTCB

## 2021-03-19 ENCOUNTER — Other Ambulatory Visit: Payer: Self-pay

## 2021-03-19 ENCOUNTER — Telehealth: Payer: Self-pay | Admitting: Cardiovascular Disease

## 2021-03-19 MED ORDER — ROSUVASTATIN CALCIUM 40 MG PO TABS: 40.0000 mg | ORAL_TABLET | Freq: Every day | ORAL | 0 refills | Status: DC

## 2021-03-19 MED ORDER — NITROGLYCERIN 0.4 MG SL SUBL: 0.4000 mg | SUBLINGUAL_TABLET | SUBLINGUAL | 2 refills | Status: DC | PRN

## 2021-03-19 MED ORDER — LISINOPRIL 5 MG PO TABS: 5.0000 mg | ORAL_TABLET | Freq: Every day | ORAL | 0 refills | Status: DC

## 2021-03-19 MED ORDER — METOPROLOL SUCCINATE ER 25 MG PO TB24: ORAL_TABLET | ORAL | 0 refills | Status: DC

## 2021-03-19 MED ORDER — CLOPIDOGREL BISULFATE 75 MG PO TABS: 75.0000 mg | ORAL_TABLET | Freq: Every day | ORAL | 0 refills | Status: DC

## 2021-03-19 NOTE — Telephone Encounter (Signed)
Called patient to advise medication refill sent to pharmacy °

## 2021-03-19 NOTE — Telephone Encounter (Signed)
?*  STAT* If patient is at the pharmacy, call can be transferred to refill team. ? ? ?1. Which medications need to be refilled? (please list name of each medication and dose if known)  ?clopidogrel (PLAVIX) 75 MG tablet ?rosuvastatin (CRESTOR) 40 MG tablet ?lisinopril (ZESTRIL) 5 MG tablet ?metoprolol succinate (TOPROL-XL) 25 MG 24 hr tablet ?nitroGLYCERIN (NITROSTAT) 0.4 MG SL tablet ? ? ?2. Which pharmacy/location (including street and city if local pharmacy) is medication to be sent to? [facility] [place] ? ?3. Do they need a 30 day or 90 day supply?  ? ? ?Patient requests a call back when refill is sent. Patient apologizes for not being able to come to his appointment tomorrow, due to having to stay with his girlfriend who had 2 strokes. He says he is also almost out of medication. He is rescheduled for 4/13. ?

## 2021-03-19 NOTE — Progress Notes (Unsigned)
Office Visit    Patient Name: [name] Date of Encounter: 03/19/2021  Primary Care Provider:  Care, Jinny Blossom [facility] Primary Cardiologist:  Shelva Majestic, MD  Chief Complaint    46 year old male with a history of CAD, chronic systolic heart failure/ICM, hypertension, hyperlipidemia, bipolar disorder, tobacco use, marijuana use who presents for follow-up related to CAD.  Past Medical History    Past Medical History:  Diagnosis Date   Anxiety    Bipolar 1 disorder ([place])    CAD S/P percutaneous coronary angioplasty    a. 01/2007 Inf STEMI: BMS-> RCA; b. 07/2013 NSTEMI: PTCA->RCA for ISR; c. 01/2014: Inf STEMI->PTCA/DES to pRCA &dRCA; d. 12/2014 Inf STEMI: RCA patent prox stent, 150m/d (3.0x32 Synergy DES); e. 05/2015 Inf STEMI: D1 75, RCA stents ok->Med rx; f. 09/2015 PCI: RCA 142m/d (thrombectomy); g. 07/2016 Cath: LM nl, LAD 40, D1 80, LCX ok, OM1 25, OM2 30, RCA stents ok, RPDA 30; h. 10/2017 MV: No isch.   Hx of medication noncompliance    Hyperlipidemia    Hypertension    Hypertensive heart disease    Ischemic cardiomyopathy    a. EF 40% in 2011, 60% in 2012. b. EF 55% by cath 07/2013. c. EF 50% by cath 01/2014; d. 12/2014 EF 35-45% by LV gram; e. 05/2015 Echo: EF 50-55% inflat/inf AKI; f. 06/2015 cMRI: EF 49%, basal & mid inf full thickness scar, subendocardial scar in antsept and antlat wall, correlating w/ D1 dzs; g. 02/2017 Echo: EF 45-50%, diff HK. Antsep/infsept AK. Mod AI, triv MR.   NSVT (nonsustained ventricular tachycardia) (HCC)    a. Very brief run during 07/2013 admit for NSTEMI felt due to MI.   Polysubstance abuse ([place])    a. h/o tobacco, marijuana and crack cocaine use. b. 07/2013: +UDS THC, neg for cocaine.   Schizophrenia ([place])    pt report    Tobacco abuse    Past Surgical History:  Procedure Laterality Date   CARDIAC CATHETERIZATION  01/16/2009   normal left main, Cfx with 2-OMs both w/minor irregularities, LAd with 20-30% mid region irregularities,  ramus intermediate/optional diagonal with 60% osital narrowing, RCA with stent in distal portion w/20% prox in-stent stenosis (Dr. Rockne Menghini)   CARDIAC CATHETERIZATION  07/23/2013   two vessel obstructive CAD, occluded first diagonal, focal in-stent restenosis in distal RCA (Dr. Peter Martinique)   [place] N/A 01/10/2015   Procedure: Left Heart Cath and Coronary Angiography;  Surgeon: Leonie Man, MD;  Location: [place] [facility];  Service: Cardiovascular;  Laterality: N/A;   CARDIAC CATHETERIZATION N/A 01/10/2015   Procedure: Coronary Stent Intervention;  Surgeon: Leonie Man, MD;  Location: [place] [facility];  Service: Cardiovascular;  Laterality: N/A;   CARDIAC CATHETERIZATION N/A 06/10/2015   Procedure: Left Heart Cath and Coronary Angiography;  Surgeon: Peter M Martinique, MD;  Location: [place] [facility];  Service: Cardiovascular;  Laterality: N/A;   CARDIAC CATHETERIZATION N/A 09/27/2015   Procedure: Left Heart Cath and Coronary Angiography;  Surgeon: Jettie Booze, MD;  Location: [place] [facility];  Service: Cardiovascular;  Laterality: N/A;   CARDIAC CATHETERIZATION N/A 09/27/2015   Procedure: Coronary Balloon Angioplasty;  Surgeon: Jettie Booze, MD;  Location: [place] [facility];  Service: Cardiovascular;  Laterality: N/A;   CORONARY ANGIOPLASTY WITH STENT PLACEMENT  02/05/2007   3.5x47mm Quantum non-DES to RCA (Dr. Shelva Majestic)   [place] N/A 02/20/2017   Procedure: EXPLORATORY  LAPAROTOMY AND REPAIR, PATCH, OR REMOVE PERFORATION IN BOWEL;  Surgeon: Rolm Bookbinder, MD;  Location: [place];  Service: General;  Laterality: N/A;   LEFT AND RIGHT HEART CATHETERIZATION WITH CORONARY ANGIOGRAM N/A 01/14/2014   Procedure: LEFT AND RIGHT HEART CATHETERIZATION WITH CORONARY ANGIOGRAM;  Surgeon: Burnell Blanks, MD;  Location: [facility] CATH LAB;  Service: Cardiovascular;  Laterality: N/A;   LEFT HEART CATH AND CORONARY ANGIOGRAPHY N/A  07/12/2016   Procedure: Left Heart Cath and Coronary Angiography;  Surgeon: Martinique, Peter M, MD;  Location: [place] [facility];  Service: Cardiovascular;  Laterality: N/A;   LEFT HEART CATHETERIZATION WITH CORONARY ANGIOGRAM N/A 07/23/2013   Procedure: LEFT HEART CATHETERIZATION WITH CORONARY ANGIOGRAM;  Surgeon: Peter M Martinique, MD;  Location: [facility] CATH LAB;  Service: Cardiovascular;  Laterality: N/A;    Allergies  Allergies  Allergen Reactions   Iodine Anaphylaxis and Swelling   Shellfish Allergy Anaphylaxis    All shellfish   Contrast Media [Iodinated Contrast Media] Nausea And Vomiting    History of Present Illness    46 year old male with the above past medical history including CAD, chronic systolic heart failure/ICM, hypertension, hyperlipidemia, bipolar disorder, tobacco use, marijuana use.   He has a history of CAD s/p STEMI, BMS-RCA in 2009.  Was hospitalized in July 2015 of non-STEMI, catheterization at that time showed occlusion of first diagonal vessel, focal restenosis within previous placed distal RCA stent, treated with Cutting Balloon.  He has had multiple ischemic evaluations since including cardiac catheterizations in 2016, May 2017, September 2017.  Most recent cardiac catheterization in July 2018 showed oRPDA-RPDA 30%, OM2 30%, OM1 25%, oLAD 10%, Od1-D1 80%, mLAD 40%, mild to moderate LV dysfunction, EF 45%, mid inferior akinesis, normal LVEDP. Echocardiogram in February 2019 showed EF 45 to 50%, diffuse hypokinesis, akinesis of the anteroseptal and inferoseptal myocardium, moderate aortic valve regurgitation, trivial mitral valve regurgitation.  Lexiscan Myoview in October 2019 showed prior inferior infarct, no ischemia. Seen in the office on 12/17/2019 was stable overall from a cardiac standpoint.  He denied symptoms concerning for angina.  He was still smoking at the time.  1 year follow-up was recommended.  Of note, he is on Abilify and Desyrel for bipolar disorder.  He  presents today for follow-up.  Since his last visit  CAD: Chronic systolic heart failure/ICM: Hypertension: Hyperlipidemia: Tobacco use: Disposition:    Home Medications    Current Outpatient Medications  Medication Sig Dispense Refill   ARIPiprazole (ABILIFY) 15 MG tablet Take 1 tablet (15 mg total) by mouth daily. 14 tablet 0   aspirin EC 81 MG tablet Take 81 mg daily by mouth.     clopidogrel (PLAVIX) 75 MG tablet Take 1 tablet by mouth once daily 30 tablet 0   lisinopril (ZESTRIL) 5 MG tablet Take 1 tablet by mouth once daily 30 tablet 0   metoprolol succinate (TOPROL-XL) 25 MG 24 hr tablet Take 1/2 (one-half) tablet by mouth once daily 15 tablet 0   nitroGLYCERIN (NITROSTAT) 0.4 MG SL tablet Place 1 tablet (0.4 mg total) under the tongue every 5 (five) minutes x 3 doses as needed for chest pain. 25 tablet 2   rosuvastatin (CRESTOR) 40 MG tablet Take 1 tablet by mouth once daily 30 tablet 0   traZODone (DESYREL) 100 MG tablet Take 1 tablet (100 mg total) by mouth at bedtime as needed for sleep. 14 tablet 0   No current facility-administered medications for this visit.     Review of Systems    ***.  All other systems reviewed and are otherwise negative except as noted above.    Physical Exam    VS:  There were no vitals taken for this visit. , BMI There is no height or weight on file to calculate BMI.     GEN: Well nourished, well developed, in no acute distress. HEENT: normal. Neck: Supple, no JVD, carotid bruits, or masses. Cardiac: RRR, no murmurs, rubs, or gallops. No clubbing, cyanosis, edema.  Radials/DP/PT 2+ and equal bilaterally.  Respiratory:  Respirations regular and unlabored, clear to auscultation bilaterally. GI: Soft, nontender, nondistended, BS + x 4. MS: no deformity or atrophy. Skin: warm and dry, no rash. Neuro:  Strength and sensation are intact. Psych: Normal affect.  Accessory Clinical Findings    ECG personally reviewed by me today - *** - no  acute changes.  Lab Results  Component Value Date   WBC 5.4 07/04/2018   HGB 14.4 07/04/2018   HCT 43.5 07/04/2018   MCV 91.0 07/04/2018   PLT 268 07/04/2018   Lab Results  Component Value Date   CREATININE 1.42 (H) 07/04/2018   BUN 5 (L) 07/04/2018   NA 135 07/04/2018   K 4.4 07/04/2018   CL 106 07/04/2018   CO2 17 (L) 07/04/2018   Lab Results  Component Value Date   ALT 27 07/04/2018   AST 26 07/04/2018   ALKPHOS 84 07/04/2018   BILITOT 0.6 07/04/2018   Lab Results  Component Value Date   CHOL 169 11/03/2017   HDL 40 (L) 11/03/2017   LDLCALC 113 (H) 11/03/2017   TRIG 78 11/03/2017   CHOLHDL 4.2 11/03/2017    Lab Results  Component Value Date   HGBA1C 5.9 (H) 11/03/2017    Assessment & Plan    1.  ***   Lenna Sciara, NP 03/19/2021, 1:10 PM

## 2021-03-20 ENCOUNTER — Ambulatory Visit: Payer: Medicare Other | Admitting: Nurse Practitioner

## 2021-04-06 ENCOUNTER — Telehealth: Payer: Self-pay | Admitting: Cardiovascular Disease

## 2021-04-06 NOTE — Telephone Encounter (Signed)
Left message to call back  

## 2021-04-06 NOTE — Telephone Encounter (Signed)
Patient called stating that he takes care of his girlfriend who had a stroke and he takes care of her.  The nurses that he gets to take care of her pull out.  Transportation told him that if Dr. Tresa Endo would write him a letter saying that his girlfriend would have to come with him to the appt, she is wheelchair bound.   ?

## 2021-04-08 NOTE — Telephone Encounter (Signed)
Left message for pt to call.

## 2021-04-10 NOTE — Progress Notes (Deleted)
Cardiology Office Note:    Date:  04/10/2021   ID:  [name], DOB 03-Mar-1975, MRN [id number]  PCP:  Care, Jovita Kussmaul Total Access   [facility] Providers Cardiologist:  Nicki Guadalajara, MD { Click to update primary MD,subspecialty MD or APP then REFRESH:1}    Referring MD: Care, Jovita Kussmaul Tota*   No chief complaint on file. ***  History of Present Illness:    [name] is a 46 y.o. male with a hx of CAD s/p inferior STEMI treated with BMS-RCA in 2009, hypertension, hyperlipidemia, bipolar disorder, tobacco use, and marijuana use.  Despite prior notes, patient has denied crack cocaine use.  Repeat heart catheterization 07/21/2013 for NSTEMI revealed focal restenosis within the previously placed RCA stent treated with Cutting Balloon intervention to the ISR only. Vessel was not re-stented due to recent history of medication noncompliance.  He also had occlusion of the first diagonal vessel that was treated medically.  Since then he has had several heart catheterizations. Heart cath in 2016 with recurrent stent stenosis with thrombosis in the setting of medication noncompliance with DES to distal RCA.  Heart cath with no PCI 05/2015. Heart cath 09/2015 with 100% occlusion of distal RCA treated with aspiration thrombectomy and PTCA. Last heart catheterization 07/12/2016 revealed single-vessel obstructive CAD involving the small diagonal branch and continued patency of stents in the RCA.  He had mild to moderate LV dysfunction estimated with an LVEF 45% and mid inferior akinesis. He had a normal LVEDP.  Echo 06/2016 with LVEF 40-45%. Follow-up echo 02/2017 showed LVEF 45 to 50%, akinesis of the anteroseptal and inferior septal myocardium, and moderate AI.  His last ischemic evaluation was a nuclear stress test 10/30/2017 that showed a large prior inferior infarct but no ischemia.  LV function was estimated at 38%.  He does have a history of psychiatric hospital admission for suicidal  ideation when he was off of his bipolar medication.  He was last seen in clinic by Dr. Tresa Endo 12/17/2019.  He had cut back on smoking cigarettes but smokes marijuana daily.  Given his disease he has been continued on DAPT with aspirin and Plavix, 40 mg Crestor, lisinopril, and low-dose beta-blocker.  He presents today for follow-up and medication refill.  I do not see ER visits or hospitalizations since last seen in 2021.    CAD Hyperlipidemia with LDL goal < 55 BMS-RCA 2009 PTCA to RCA 2015 - ASA and plavix, BB, statin - will need updated lipid panel    Ischemic cardiomyopathy Chronic systolic heart failure Hypertension  EF 40% in 2011, improved to 60% in 2012 Last echocardiogram in 2019 with LVEF 45 to 50% and moderate AI - continues on ASA and plavix, BB, and lisinopril - will need updated BMP   Current smoker Alcohol use? - denies cocaine use       Past Medical History:  Diagnosis Date   Anxiety    Bipolar 1 disorder (HCC)    CAD S/P percutaneous coronary angioplasty    a. 01/2007 Inf STEMI: BMS-> RCA; b. 07/2013 NSTEMI: PTCA->RCA for ISR; c. 01/2014: Inf STEMI->PTCA/DES to pRCA &dRCA; d. 12/2014 Inf STEMI: RCA patent prox stent, 147m/d (3.0x32 Synergy DES); e. 05/2015 Inf STEMI: D1 75, RCA stents ok->Med rx; f. 09/2015 PCI: RCA 129m/d (thrombectomy); g. 07/2016 Cath: LM nl, LAD 40, D1 80, LCX ok, OM1 25, OM2 30, RCA stents ok, RPDA 30; h. 10/2017 MV: No isch.   Hx of medication noncompliance    Hyperlipidemia  Hypertension    Hypertensive heart disease    Ischemic cardiomyopathy    a. EF 40% in 2011, 60% in 2012. b. EF 55% by cath 07/2013. c. EF 50% by cath 01/2014; d. 12/2014 EF 35-45% by LV gram; e. 05/2015 Echo: EF 50-55% inflat/inf AKI; f. 06/2015 cMRI: EF 49%, basal & mid inf full thickness scar, subendocardial scar in antsept and antlat wall, correlating w/ D1 dzs; g. 02/2017 Echo: EF 45-50%, diff HK. Antsep/infsept AK. Mod AI, triv MR.   NSVT (nonsustained ventricular  tachycardia) (HCC)    a. Very brief run during 07/2013 admit for NSTEMI felt due to MI.   Polysubstance abuse (HCC)    a. h/o tobacco, marijuana and crack cocaine use. b. 07/2013: +UDS THC, neg for cocaine.   Schizophrenia (HCC)    pt report    Tobacco abuse     Past Surgical History:  Procedure Laterality Date   CARDIAC CATHETERIZATION  01/16/2009   normal left main, Cfx with 2-OMs both w/minor irregularities, LAd with 20-30% mid region irregularities, ramus intermediate/optional diagonal with 60% osital narrowing, RCA with stent in distal portion w/20% prox in-stent stenosis (Dr. Langston Reusing)   CARDIAC CATHETERIZATION  07/23/2013   two vessel obstructive CAD, occluded first diagonal, focal in-stent restenosis in distal RCA (Dr. Peter Swaziland)   CARDIAC CATHETERIZATION N/A 01/10/2015   Procedure: Left Heart Cath and Coronary Angiography;  Surgeon: Marykay Lex, MD;  Location:  [facility] INVASIVE CV LAB;  Service: Cardiovascular;  Laterality: N/A;   CARDIAC CATHETERIZATION N/A 01/10/2015   Procedure: Coronary Stent Intervention;  Surgeon: Marykay Lex, MD;  Location: [facility] INVASIVE CV LAB;  Service: Cardiovascular;  Laterality: N/A;   CARDIAC CATHETERIZATION N/A 06/10/2015   Procedure: Left Heart Cath and Coronary Angiography;  Surgeon: Peter M Swaziland, MD;  Location: [facility] INVASIVE CV LAB;  Service: Cardiovascular;  Laterality: N/A;   CARDIAC CATHETERIZATION N/A 09/27/2015   Procedure: Left Heart Cath and Coronary Angiography;  Surgeon: Corky Crafts, MD;  Location: [facility] INVASIVE CV LAB;  Service: Cardiovascular;  Laterality: N/A;   CARDIAC CATHETERIZATION N/A 09/27/2015   Procedure: Coronary Balloon Angioplasty;  Surgeon: Corky Crafts, MD;  Location: [facility] INVASIVE CV LAB;  Service: Cardiovascular;  Laterality: N/A;   CORONARY ANGIOPLASTY WITH STENT PLACEMENT  02/05/2007   3.5x58mm Quantum non-DES to RCA (Dr. Nicki Guadalajara)   FEMORAL ARTERY STENT     LAPAROTOMY N/A 02/20/2017   Procedure: EXPLORATORY  LAPAROTOMY AND REPAIR, PATCH, OR REMOVE PERFORATION IN BOWEL;  Surgeon: Emelia Loron, MD;  Location: [facility] OR;  Service: General;  Laterality: N/A;   LEFT AND RIGHT HEART CATHETERIZATION WITH CORONARY ANGIOGRAM N/A 01/14/2014   Procedure: LEFT AND RIGHT HEART CATHETERIZATION WITH CORONARY ANGIOGRAM;  Surgeon: Kathleene Hazel, MD;  Location: [facility] CATH LAB;  Service: Cardiovascular;  Laterality: N/A;   LEFT HEART CATH AND CORONARY ANGIOGRAPHY N/A 07/12/2016   Procedure: Left Heart Cath and Coronary Angiography;  Surgeon: Swaziland, Peter M, MD;  Location: [facility] INVASIVE CV LAB;  Service: Cardiovascular;  Laterality: N/A;   LEFT HEART CATHETERIZATION WITH CORONARY ANGIOGRAM N/A 07/23/2013   Procedure: LEFT HEART CATHETERIZATION WITH CORONARY ANGIOGRAM;  Surgeon: Peter M Swaziland, MD;  Location: [facility] CATH LAB;  Service: Cardiovascular;  Laterality: N/A;    Current Medications: No outpatient medications have been marked as taking for the 04/23/21 encounter (Appointment) with Marcelino Duster, PA.     Allergies:   Iodine, Shellfish allergy, and Contrast media [iodinated contrast media]   Social History  Socioeconomic History   Marital status: Single    Spouse name: Not on file   Number of children: Not on file   Years of education: Not on file   Highest education level: Not on file  Occupational History   Not on file  Tobacco Use   Smoking status: Some Days    Packs/day: 1.00    Years: 30.00    Pack years: 30.00    Types: Cigarettes   Smokeless tobacco: Never   Tobacco comments:    pt states every 2 days  Vaping Use   Vaping Use: Never used  Substance and Sexual Activity   Alcohol use: Yes    Alcohol/week: 0.0 standard drinks    Comment: 40 oz   Drug use: Yes    Types: Marijuana    Comment: current user- daily one-two blunts   Sexual activity: Not Currently  Other Topics Concern   Not on file  Social History Narrative   Lives locally with girlfriend.  Does not routinely exercise.    Social Determinants of Health   Financial Resource Strain: Not on file  Food Insecurity: Not on file  Transportation Needs: Not on file  Physical Activity: Not on file  Stress: Not on file  Social Connections: Not on file     Family History: The patient's ***family history includes CAD in his mother and sister; Heart attack in his mother; Heart disease in his mother; Schizophrenia in his mother.  ROS:   Please see the history of present illness.    *** All other systems reviewed and are negative.  EKGs/Labs/Other Studies Reviewed:    The following studies were reviewed today: ***  EKG:  EKG is *** ordered today.  The ekg ordered today demonstrates ***  Recent Labs: No results found for requested labs within last 8760 hours.  Recent Lipid Panel    Component Value Date/Time   CHOL 169 11/03/2017 0322   TRIG 78 11/03/2017 0322   HDL 40 (L) 11/03/2017 0322   CHOLHDL 4.2 11/03/2017 0322   VLDL 16 11/03/2017 0322   LDLCALC 113 (H) 11/03/2017 0322     Risk Assessment/Calculations:   {Does this patient have ATRIAL FIBRILLATION?[phone]}       Physical Exam:    VS:  There were no vitals taken for this visit.    Wt Readings from Last 3 Encounters:  12/17/19 193 lb 6.4 oz (87.7 kg)  12/15/18 185 lb (83.9 kg)  02/12/18 173 lb 8 oz (78.7 kg)     GEN: *** Well nourished, well developed in no acute distress HEENT: Normal NECK: No JVD; No carotid bruits LYMPHATICS: No lymphadenopathy CARDIAC: ***RRR, no murmurs, rubs, gallops RESPIRATORY:  Clear to auscultation without rales, wheezing or rhonchi  ABDOMEN: Soft, non-tender, non-distended MUSCULOSKELETAL:  No edema; No deformity  SKIN: Warm and dry NEUROLOGIC:  Alert and oriented x 3 PSYCHIATRIC:  Normal affect   ASSESSMENT:    No diagnosis found. PLAN:    In order of problems listed above:  ***      {Are you ordering a CV Procedure (e.g. stress test, cath, DCCV, TEE, etc)?   Press F2         :[id number]}    Medication Adjustments/Labs and Tests Ordered: Current medicines are reviewed at length with the patient today.  Concerns regarding medicines are outlined above.  No orders of the defined types were placed in this encounter.  No orders of the defined types were placed in this encounter.   There  are no Patient Instructions on file for this visit.   Signed, Marcelino Duster, [place]  04/10/2021 10:25 AM    [facility]

## 2021-04-10 NOTE — Telephone Encounter (Signed)
LMTCB

## 2021-04-14 NOTE — Telephone Encounter (Signed)
LMTCB

## 2021-04-14 NOTE — Telephone Encounter (Signed)
Patient calling back.   °

## 2021-04-20 ENCOUNTER — Telehealth: Payer: Self-pay | Admitting: Cardiovascular Disease

## 2021-04-20 MED ORDER — CLOPIDOGREL BISULFATE 75 MG PO TABS: 75.0000 mg | ORAL_TABLET | Freq: Every day | ORAL | 0 refills | Status: DC

## 2021-04-20 MED ORDER — METOPROLOL SUCCINATE ER 25 MG PO TB24: ORAL_TABLET | ORAL | 0 refills | Status: DC

## 2021-04-20 MED ORDER — ROSUVASTATIN CALCIUM 40 MG PO TABS: 40.0000 mg | ORAL_TABLET | Freq: Every day | ORAL | 0 refills | Status: DC

## 2021-04-20 MED ORDER — LISINOPRIL 5 MG PO TABS: 5.0000 mg | ORAL_TABLET | Freq: Every day | ORAL | 0 refills | Status: DC

## 2021-04-20 NOTE — Telephone Encounter (Signed)
? ? ?*  STAT* If patient is at the pharmacy, call can be transferred to refill team. ? ? ?1. Which medications need to be refilled? (please list name of each medication and dose if known) clopidogrel (PLAVIX) 75 MG tablet ?lisinopril (ZESTRIL) 5 MG tablet ?metoprolol succinate (TOPROL-XL) 25 MG 24 hr tablet ?rosuvastatin (CRESTOR) 40 MG tablet ? ?2. Which pharmacy/location (including street and city if local pharmacy) is medication to be sent to? [id number] - [place] ([place]), [place] - [address] ? ?3. Do they need a 30 day or 90 day supply? 30 days ? ? ?Pt had to r/s his appt due to transportation issue  ? ?

## 2021-04-20 NOTE — Telephone Encounter (Signed)
Refill sent to pharmacy.   

## 2021-04-20 NOTE — Telephone Encounter (Signed)
? ?  Pt returning call. He said to call him at [phone] and he will answer. He had to r/s his appt on 05/04 so he can get the letter and set up his transportation  ?

## 2021-04-20 NOTE — Telephone Encounter (Signed)
Left message for pt to call.

## 2021-04-21 NOTE — Telephone Encounter (Signed)
Left message to call back. When patient calls again, please keep him on the phone until a nurse in triage can answer him. Thank you. ?

## 2021-04-23 ENCOUNTER — Ambulatory Visit: Payer: Medicare Other | Admitting: Physician Assistant

## 2021-05-13 NOTE — Progress Notes (Signed)
?Cardiology Office Note:   ? ?Date:  05/13/2021  ? ?ID:  [name], DOB 1975/05/21, MRN [id number] ? ?PCP:  [facility] Cardiologist: Shelva Majestic, MD  ? ?Reason for visit: Overdue 1 year follow-up ? ?History of Present Illness:   ? ?[name] is a 46 y.o. male with a hx of CAD with inferior STEMI in 2009 with bare-metal stent to the RCA, NSTEMI in 2015 with Cutting Balloon intervention to in-stent restenosis of the RCA stent, hypertension, hyperlipidemia, bipolar disorder, suicidal ideation (when off bipolar meds), gastric ulcers, tobacco use and marijuana use. ? ?He was last seen by Dr. Claiborne Billings in December 2021.  Patient denied chest pain and shortness of breath.  He was still smoking 5 cigarettes a day. ? ?Today, patient states he is doing well.  He states he is living apartment with his girlfriend.  He states he has not been seen in over a year because his girlfriend had a stroke and is very dependent on his care.  He states he is taking his medications and he has [place] [facility].  He is still smoking approximately half pack per day and using marijuana.  He states occasional alcohol use. ? ?He denies chest pain, shortness of breath, PND, orthopnea, lower extremity edema, palpitations and syncope.  He has lightheadedness if he gets up too quickly.  He wonders if he may have sleep apnea as he has been told he snores and possible apneic episodes.  Though he is not interested in CPAP treatment as he states he likes to sleep lightly in case his girlfriend needs him.  He states he thinks his PCP office close down.  He denies bleeding issues with aspirin & Plavix. ? ?  ?Past Medical History:  ?Diagnosis Date  ? Anxiety   ? Bipolar 1 disorder ([place])   ? CAD S/P percutaneous coronary angioplasty   ? a. 01/2007 Inf STEMI: BMS-> RCA; b. 07/2013 NSTEMI: PTCA->RCA for ISR; c. 01/2014: Inf STEMI->PTCA/DES to pRCA &dRCA; d. 12/2014 Inf STEMI: RCA patent prox stent, 131m/d  (3.0x32 Synergy DES); e. 05/2015 Inf STEMI: D1 75, RCA stents ok->Med rx; f. 09/2015 PCI: RCA 165m/d (thrombectomy); g. 07/2016 Cath: LM nl, LAD 40, D1 80, LCX ok, OM1 25, OM2 30, RCA stents ok, RPDA 30; h. 10/2017 MV: No isch.  ? Hx of medication noncompliance   ? Hyperlipidemia   ? Hypertension   ? Hypertensive heart disease   ? Ischemic cardiomyopathy   ? a. EF 40% in 2011, 60% in 2012. b. EF 55% by cath 07/2013. c. EF 50% by cath 01/2014; d. 12/2014 EF 35-45% by LV gram; e. 05/2015 Echo: EF 50-55% inflat/inf AKI; f. 06/2015 cMRI: EF 49%, basal & mid inf full thickness scar, subendocardial scar in antsept and antlat wall, correlating w/ D1 dzs; g. 02/2017 Echo: EF 45-50%, diff HK. Antsep/infsept AK. Mod AI, triv MR.  ? NSVT (nonsustained ventricular tachycardia) ([place])   ? a. Very brief run during 07/2013 admit for NSTEMI felt due to MI.  ? Polysubstance abuse ([place])   ? a. h/o tobacco, marijuana and crack cocaine use. b. 07/2013: +UDS THC, neg for cocaine.  ? Schizophrenia ([place])   ? pt report   ? Tobacco abuse   ? ? ?Past Surgical History:  ?Procedure Laterality Date  ? CARDIAC CATHETERIZATION  01/16/2009  ? normal left main, Cfx with 2-OMs both w/minor irregularities, LAd with 20-30% mid region irregularities, ramus intermediate/optional diagonal with 60% osital  narrowing, RCA with stent in distal portion w/20% prox in-stent stenosis (Dr. Rockne Menghini)  ? CARDIAC CATHETERIZATION  07/23/2013  ? two vessel obstructive CAD, occluded first diagonal, focal in-stent restenosis in distal RCA (Dr. Peter Martinique)  ? CARDIAC CATHETERIZATION N/A 01/10/2015  ? Procedure: Left Heart Cath and Coronary Angiography;  Surgeon: Leonie Man, MD;  Location: [place] [facility];  Service: Cardiovascular;  Laterality: N/A;  ? CARDIAC CATHETERIZATION N/A 01/10/2015  ? Procedure: Coronary Stent Intervention;  Surgeon: Leonie Man, MD;  Location: [facility];  Service: Cardiovascular;  Laterality: N/A;  ? CARDIAC CATHETERIZATION N/A  06/10/2015  ? Procedure: Left Heart Cath and Coronary Angiography;  Surgeon: Peter M Martinique, MD;  Location: [facility];  Service: Cardiovascular;  Laterality: N/A;  ? CARDIAC CATHETERIZATION N/A 09/27/2015  ? Procedure: Left Heart Cath and Coronary Angiography;  Surgeon: Jettie Booze, MD;  Location: [facility] [facility];  Service: Cardiovascular;  Laterality: N/A;  ? CARDIAC CATHETERIZATION N/A 09/27/2015  ? Procedure: Coronary Balloon Angioplasty;  Surgeon: Jettie Booze, MD;  Location: [facility];  Service: Cardiovascular;  Laterality: N/A;  ? CORONARY ANGIOPLASTY WITH STENT PLACEMENT  02/05/2007  ? 3.5x44mm Quantum non-DES to RCA (Dr. Shelva Majestic)  ? FEMORAL ARTERY STENT    ? LAPAROTOMY N/A 02/20/2017  ? Procedure: EXPLORATORY LAPAROTOMY AND REPAIR, PATCH, OR REMOVE PERFORATION IN BOWEL;  Surgeon: Rolm Bookbinder, MD;  Location: [facility];  Service: General;  Laterality: N/A;  ? LEFT AND RIGHT HEART CATHETERIZATION WITH CORONARY ANGIOGRAM N/A 01/14/2014  ? Procedure: LEFT AND RIGHT HEART CATHETERIZATION WITH CORONARY ANGIOGRAM;  Surgeon: Burnell Blanks, MD;  Location: [facility] CATH LAB;  Service: Cardiovascular;  Laterality: N/A;  ? LEFT HEART CATH AND CORONARY ANGIOGRAPHY N/A 07/12/2016  ? Procedure: Left Heart Cath and Coronary Angiography;  Surgeon: Martinique, Peter M, MD;  Location: [facility];  Service: Cardiovascular;  Laterality: N/A;  ? LEFT HEART CATHETERIZATION WITH CORONARY ANGIOGRAM N/A 07/23/2013  ? Procedure: LEFT HEART CATHETERIZATION WITH CORONARY ANGIOGRAM;  Surgeon: Peter M Martinique, MD;  Location: [facility];  Service: Cardiovascular;  Laterality: N/A;  ? ? ?Current Medications: ?No outpatient medications have been marked as taking for the 05/14/21 encounter (Appointment) with Warren Lacy, PA-C.  ?  ? ?Allergies:   Iodine, Shellfish allergy, and Contrast media [iodinated contrast media]  ? ?Social History  ? ?Socioeconomic History  ? Marital status: Single  ?   Spouse name: Not on file  ? Number of children: Not on file  ? Years of education: Not on file  ? Highest education level: Not on file  ?Occupational History  ? Not on file  ?Tobacco Use  ? Smoking status: Some Days  ?  Packs/day: 1.00  ?  Years: 30.00  ?  Pack years: 30.00  ?  Types: Cigarettes  ? Smokeless tobacco: Never  ? Tobacco comments:  ?  pt states every 2 days  ?Vaping Use  ? Vaping Use: Never used  ?Substance and Sexual Activity  ? Alcohol use: Yes  ?  Alcohol/week: 0.0 standard drinks  ?  Comment: 40 oz  ? Drug use: Yes  ?  Types: Marijuana  ?  Comment: current user- daily one-two blunts  ? Sexual activity: Not Currently  ?Other Topics Concern  ? Not on file  ?Social History Narrative  ? Lives locally with girlfriend.  Does not routinely exercise.  ? ?Social Determinants of Health  ? ?Financial  Resource Strain: Not on file  ?Food Insecurity: Not on file  ?Transportation Needs: Not on file  ?Physical Activity: Not on file  ?Stress: Not on file  ?Social Connections: Not on file  ?  ? ?Family History: ?The patient's family history includes CAD in his mother and sister; Heart attack in his mother; Heart disease in his mother; Schizophrenia in his mother. ? ?ROS:   ?Please see the history of present illness.    ? ?EKGs/Labs/Other Studies Reviewed:   ? ?EKG:  The ekg ordered today demonstrates sinus bradycardia, heart rate 53, left ventricular hypertrophy, inferior infarct, no significant change from prior. ? ?Recent Labs: ?No results found for requested labs within last 8760 hours.  ? ?Recent Lipid Panel ?Lab Results  ?Component Value Date/Time  ? CHOL 169 11/03/2017 03:22 AM  ? TRIG 78 11/03/2017 03:22 AM  ? HDL 40 (L) 11/03/2017 03:22 AM  ? LDLCALC 113 (H) 11/03/2017 03:22 AM  ? ? ?Physical Exam:   ? ?VS:  There were no vitals taken for this visit.   ?No data found. ? ?Wt Readings from Last 3 Encounters:  ?12/17/19 193 lb 6.4 oz (87.7 kg)  ?12/15/18 185 lb (83.9 kg)  ?02/12/18 173 lb 8 oz (78.7 kg)  ?   ? ?GEN:  Thin male in no acute distress ?HEENT: Normal ?NECK: No JVD; No carotid bruits ?CARDIAC: RRR, no murmurs, rubs, gallops ?RESPIRATORY:  Clear to auscultation without rales, wheezing or rhonchi  ?ABDOMEN: Soft, non

## 2021-05-14 ENCOUNTER — Ambulatory Visit (INDEPENDENT_AMBULATORY_CARE_PROVIDER_SITE_OTHER): Payer: Medicare Other | Admitting: Physician Assistant

## 2021-05-14 ENCOUNTER — Encounter: Payer: Self-pay | Admitting: Physician Assistant

## 2021-05-14 VITALS — BP 124/75 | HR 53 | Ht 72.0 in | Wt 173.4 lb

## 2021-05-14 DIAGNOSIS — I2510 Atherosclerotic heart disease of native coronary artery without angina pectoris: Secondary | ICD-10-CM

## 2021-05-14 DIAGNOSIS — I10 Essential (primary) hypertension: Secondary | ICD-10-CM | POA: Diagnosis not present

## 2021-05-14 DIAGNOSIS — E785 Hyperlipidemia, unspecified: Secondary | ICD-10-CM

## 2021-05-14 DIAGNOSIS — Z9861 Coronary angioplasty status: Secondary | ICD-10-CM

## 2021-05-14 DIAGNOSIS — Z720 Tobacco use: Secondary | ICD-10-CM

## 2021-05-14 DIAGNOSIS — I351 Nonrheumatic aortic (valve) insufficiency: Secondary | ICD-10-CM

## 2021-05-14 MED ORDER — METOPROLOL SUCCINATE ER 25 MG PO TB24: ORAL_TABLET | ORAL | 11 refills | Status: DC

## 2021-05-14 MED ORDER — LISINOPRIL 5 MG PO TABS: 5.0000 mg | ORAL_TABLET | Freq: Every day | ORAL | 11 refills | Status: DC

## 2021-05-14 MED ORDER — NITROGLYCERIN 0.4 MG SL SUBL: 0.4000 mg | SUBLINGUAL_TABLET | SUBLINGUAL | 2 refills | Status: DC | PRN

## 2021-05-14 MED ORDER — ASPIRIN EC 81 MG PO TBEC: 81.0000 mg | DELAYED_RELEASE_TABLET | Freq: Every day | ORAL | 3 refills | Status: AC

## 2021-05-14 MED ORDER — CLOPIDOGREL BISULFATE 75 MG PO TABS: 75.0000 mg | ORAL_TABLET | Freq: Every day | ORAL | 11 refills | Status: DC

## 2021-05-14 MED ORDER — ROSUVASTATIN CALCIUM 40 MG PO TABS: 40.0000 mg | ORAL_TABLET | Freq: Every day | ORAL | 11 refills | Status: DC

## 2021-05-14 NOTE — Patient Instructions (Addendum)
Medication Instructions:  ?No changes ?*If you need a refill on your cardiac medications before your next appointment, please call your pharmacy* ? ? ?Lab Work: ?CBC, CMET, Direct LDL, Lipid Panel, Today ?If you have labs (blood work) drawn today and your tests are completely normal, you will receive your results only by: ?MyChart Message (if you have MyChart) OR ?A paper copy in the mail ?If you have any lab test that is abnormal or we need to change your treatment, we will call you to review the results. ? ? ?Testing/Procedures: [address], [address]. ?Your physician has requested that you have an echocardiogram. Echocardiography is a painless test that uses sound waves to create images of your heart. It provides your doctor with information about the size and shape of your heart and how well your heart?s chambers and valves are working. This procedure takes approximately one hour. There are no restrictions for this procedure.  ? ? ?Follow-Up: ?At [facility], you and your health needs are our priority.  As part of our continuing mission to provide you with exceptional heart care, we have created designated Provider Care Teams.  These Care Teams include your primary Cardiologist (physician) and Advanced Practice Providers (APPs -  Physician Assistants and Nurse Practitioners) who all work together to provide you with the care you need, when you need it. ? ?We recommend signing up for the patient portal called "MyChart".  Sign up information is provided on this After Visit Summary.  MyChart is used to connect with patients for Virtual Visits (Telemedicine).  Patients are able to view lab/test results, encounter notes, upcoming appointments, etc.  Non-urgent messages can be sent to your provider as well.   ?To learn more about what you can do with MyChart, go to [email or web address].   ? ?Your next appointment:   ?1 year(s) ? ?The format for your next appointment:   ?In Person ? ?Provider:    ?Nicki Guadalajara, MD   ? ? ?O ? ?Important Information About Sugar ? ? ? ? ?  ?

## 2021-05-15 LAB — CBC
Hematocrit: 39.6 % (ref 37.5–51.0)
Hemoglobin: 12.9 g/dL — ABNORMAL LOW (ref 13.0–17.7)
MCH: 30.1 pg (ref 26.6–33.0)
MCHC: 32.6 g/dL (ref 31.5–35.7)
MCV: 92 fL (ref 79–97)
Platelets: 200 10*3/uL (ref 150–450)
RBC: 4.29 x10E6/uL (ref 4.14–5.80)
RDW: 14.4 % (ref 11.6–15.4)
WBC: 5.6 10*3/uL (ref 3.4–10.8)

## 2021-05-15 LAB — COMPREHENSIVE METABOLIC PANEL
ALT: 45 IU/L — ABNORMAL HIGH (ref 0–44)
AST: 34 IU/L (ref 0–40)
Albumin/Globulin Ratio: 1.6 (ref 1.2–2.2)
Albumin: 4.7 g/dL (ref 4.0–5.0)
Alkaline Phosphatase: 88 IU/L (ref 44–121)
BUN/Creatinine Ratio: 6 — ABNORMAL LOW (ref 9–20)
BUN: 8 mg/dL (ref 6–24)
Bilirubin Total: 0.3 mg/dL (ref 0.0–1.2)
CO2: 22 mmol/L (ref 20–29)
Calcium: 9.4 mg/dL (ref 8.7–10.2)
Chloride: 101 mmol/L (ref 96–106)
Creatinine, Ser: 1.31 mg/dL — ABNORMAL HIGH (ref 0.76–1.27)
Globulin, Total: 2.9 g/dL (ref 1.5–4.5)
Glucose: 77 mg/dL (ref 70–99)
Potassium: 4.6 mmol/L (ref 3.5–5.2)
Sodium: 138 mmol/L (ref 134–144)
Total Protein: 7.6 g/dL (ref 6.0–8.5)
eGFR: 69 mL/min/{1.73_m2} (ref >59)

## 2021-05-15 LAB — LIPID PANEL
Chol/HDL Ratio: 3.2 ratio (ref 0.0–5.0)
Cholesterol, Total: 171 mg/dL (ref 100–199)
HDL: 54 mg/dL (ref >39)
LDL Chol Calc (NIH): 104 mg/dL — ABNORMAL HIGH (ref 0–99)
Triglycerides: 70 mg/dL (ref 0–149)
VLDL Cholesterol Cal: 13 mg/dL (ref 5–40)

## 2021-05-15 LAB — LDL CHOLESTEROL, DIRECT: LDL Direct: 108 mg/dL — ABNORMAL HIGH (ref 0–99)

## 2021-05-20 ENCOUNTER — Telehealth: Payer: Self-pay

## 2021-05-20 NOTE — Telephone Encounter (Addendum)
Called patient regarding results. Leftmessage for patient to call office. ?----- Message from Warren Lacy, PA-C sent at 05/19/2021  1:41 PM EDT ----- ?Bad cholesterol (LDL) improved to 104.  LDL previously 113 in 2019, 202 in 2018.   ?Blood counts unremarkable.  Kidney function stable.  Electrolytes normal.   ?Continue current medications.   ?

## 2021-05-25 ENCOUNTER — Telehealth: Payer: Self-pay

## 2021-05-25 NOTE — Telephone Encounter (Addendum)
Letter mailed to patient.----- Message from Cannon Kettle, PA-C sent at 05/19/2021  1:41 PM EDT ----- ?Bad cholesterol (LDL) improved to 104.  LDL previously 113 in 2019, 202 in 2018.   ?Blood counts unremarkable.  Kidney function stable.  Electrolytes normal.   ?Continue current medications.   ?

## 2021-05-27 ENCOUNTER — Other Ambulatory Visit: Payer: Self-pay | Admitting: Cardiovascular Disease

## 2021-05-27 ENCOUNTER — Telehealth: Payer: Self-pay | Admitting: Cardiovascular Disease

## 2021-05-27 NOTE — Telephone Encounter (Signed)
?*  STAT* If patient is at the pharmacy, call can be transferred to refill team. ? ? ?1. Which medications need to be refilled? (please list name of each medication and dose if known) clopidogrel (PLAVIX) 75 MG tablet ? ?lisinopril (ZESTRIL) 5 MG tablet ? ?metoprolol succinate (TOPROL-XL) 25 MG 24 hr tablet ? ?nitroGLYCERIN (NITROSTAT) 0.4 MG SL tablet ? ?rosuvastatin (CRESTOR) 40 MG tablet ? ?2. Which pharmacy/location (including street and city if local pharmacy) is medication to be sent to? [place] ([place]), [place] ? ?3. Do they need a 30 day or 90 day supply? 90 ? ?

## 2021-05-27 NOTE — Telephone Encounter (Signed)
?*  STAT* If patient is at the pharmacy, call can be transferred to refill team. ? ? ?1. Which medications need to be refilled? (please list name of each medication and dose if known)  ? clopidogrel (PLAVIX) 75 MG tablet  ? ? lisinopril (ZESTRIL) 5 MG tablet  ?metoprolol succinate (TOPROL-XL) 25 MG 24 hr tablet ? nitroGLYCERIN (NITROSTAT) 0.4 MG SL tablet  ? ?2. Which pharmacy/location (including street and city if local pharmacy) is medication to be sent to?  ?[id number] - [place] ([place]), [place] - [address] ? ?3. Do they need a 30 day or 90 day supply?  ?30 day ? ?Pt states that pharmacy is saying that they did not receive request on refills for these medications. Please advise ? ?

## 2021-05-28 NOTE — Telephone Encounter (Signed)
Medications have already been refilled.

## 2021-06-18 ENCOUNTER — Telehealth (HOSPITAL_COMMUNITY): Payer: Self-pay | Admitting: Physician Assistant

## 2021-06-18 NOTE — Telephone Encounter (Signed)
Patient cancelled echocardiogram scheduled for 06/22/21 and did not wish to reschedule at this time. Order will be removed from the echo WQ. Thank you

## 2021-06-22 ENCOUNTER — Ambulatory Visit (HOSPITAL_COMMUNITY): Payer: Medicare Other

## 2022-02-20 ENCOUNTER — Other Ambulatory Visit: Payer: Self-pay | Admitting: Physician Assistant

## 2022-05-01 ENCOUNTER — Other Ambulatory Visit: Payer: Self-pay | Admitting: Cardiovascular Disease

## 2022-05-03 NOTE — Telephone Encounter (Signed)
Rx request sent to pharmacy.  

## 2022-05-26 ENCOUNTER — Other Ambulatory Visit: Payer: Self-pay | Admitting: Physician Assistant

## 2022-07-10 ENCOUNTER — Other Ambulatory Visit: Payer: Self-pay | Admitting: Cardiovascular Disease

## 2022-08-04 ENCOUNTER — Other Ambulatory Visit: Payer: Self-pay | Admitting: Cardiovascular Disease

## 2022-09-05 ENCOUNTER — Other Ambulatory Visit: Payer: Self-pay | Admitting: Cardiovascular Disease

## 2023-01-12 ENCOUNTER — Other Ambulatory Visit: Payer: Self-pay | Admitting: Cardiovascular Disease

## 2023-01-31 ENCOUNTER — Other Ambulatory Visit: Payer: Self-pay | Admitting: Cardiovascular Disease

## 2023-02-21 ENCOUNTER — Telehealth: Payer: Self-pay | Admitting: Cardiovascular Disease

## 2023-02-21 MED ORDER — CLOPIDOGREL BISULFATE 75 MG PO TABS: 75.0000 mg | ORAL_TABLET | Freq: Every day | ORAL | 3 refills | Status: DC

## 2023-02-21 MED ORDER — ROSUVASTATIN CALCIUM 40 MG PO TABS: 40.0000 mg | ORAL_TABLET | Freq: Every day | ORAL | 3 refills | Status: DC

## 2023-02-21 MED ORDER — NITROGLYCERIN 0.4 MG SL SUBL: 0.4000 mg | SUBLINGUAL_TABLET | SUBLINGUAL | 3 refills | Status: DC | PRN

## 2023-02-21 MED ORDER — LISINOPRIL 5 MG PO TABS: 5.0000 mg | ORAL_TABLET | Freq: Every day | ORAL | 3 refills | Status: DC

## 2023-02-21 MED ORDER — METOPROLOL SUCCINATE ER 25 MG PO TB24: ORAL_TABLET | ORAL | 3 refills | Status: DC

## 2023-02-21 NOTE — Telephone Encounter (Signed)
 Pt's medications were sent to pt's pharmacy. Confirmation received.

## 2023-02-21 NOTE — Telephone Encounter (Signed)
 Pt c/o medication issue:  1. Name of Medication: Blood thinner   2. How are you currently taking this medication (dosage and times per day)?   3. Are you having a reaction (difficulty breathing--STAT)?   4. What is your medication issue? Patient would like a call back to discuss if any of his medications would be considered a blood thinner and if not, he would like to discuss if he should be taking said medication. Please advise.

## 2023-02-21 NOTE — Telephone Encounter (Signed)
*  STAT* If patient is at the pharmacy, call can be transferred to refill team.   1. Which medications need to be refilled? (please list name of each medication and dose if known)    aspirin  EC 81 MG tablet  clopidogrel  (PLAVIX ) 75 MG tablet   lisinopril  (ZESTRIL ) 5 MG tablet    metoprolol  succinate (TOPROL -XL) 25 MG 24 hr tablet  nitroGLYCERIN  (NITROSTAT ) 0.4 MG SL tablet  rosuvastatin  (CRESTOR ) 40 MG tablet    2. Which pharmacy/location (including street and city if local pharmacy) is medication to be sent to? [facility] - [place], [place] - [address]   3. Do they need a 30 day or 90 day supply? 90

## 2023-02-21 NOTE — Telephone Encounter (Signed)
 Attempted to call patient x2, no answer left message requesting a call back.

## 2023-02-22 ENCOUNTER — Ambulatory Visit: Payer: Medicare Other | Admitting: Cardiovascular Disease

## 2023-02-22 NOTE — Telephone Encounter (Signed)
2nd attempt to call patient, no answer left message requesting a call back.

## 2023-02-23 ENCOUNTER — Other Ambulatory Visit: Payer: Self-pay

## 2023-02-23 ENCOUNTER — Emergency Department (HOSPITAL_COMMUNITY): Payer: 59

## 2023-02-23 ENCOUNTER — Emergency Department (HOSPITAL_COMMUNITY)
Admission: EM | Admit: 2023-02-23 | Discharge: 2023-02-23 | Disposition: A | Discharge status: Home / Self Care | Payer: 59 | Attending: Emergency Medicine | Admitting: Emergency Medicine

## 2023-02-23 ENCOUNTER — Encounter (HOSPITAL_COMMUNITY): Payer: Self-pay

## 2023-02-23 DIAGNOSIS — R1013 Epigastric pain: Secondary | ICD-10-CM | POA: Diagnosis not present

## 2023-02-23 DIAGNOSIS — R0602 Shortness of breath: Secondary | ICD-10-CM | POA: Insufficient documentation

## 2023-02-23 DIAGNOSIS — I252 Old myocardial infarction: Secondary | ICD-10-CM | POA: Insufficient documentation

## 2023-02-23 DIAGNOSIS — R0789 Other chest pain: Secondary | ICD-10-CM | POA: Diagnosis present

## 2023-02-23 DIAGNOSIS — Z955 Presence of coronary angioplasty implant and graft: Secondary | ICD-10-CM | POA: Diagnosis not present

## 2023-02-23 DIAGNOSIS — R197 Diarrhea, unspecified: Secondary | ICD-10-CM | POA: Insufficient documentation

## 2023-02-23 DIAGNOSIS — Z7982 Long term (current) use of aspirin: Secondary | ICD-10-CM | POA: Insufficient documentation

## 2023-02-23 LAB — RESP PANEL BY RT-PCR (RSV, FLU A&B, COVID)  RVPGX2
Influenza A by PCR: NEGATIVE
Influenza B by PCR: NEGATIVE
Resp Syncytial Virus by PCR: NEGATIVE
SARS Coronavirus 2 by RT PCR: NEGATIVE

## 2023-02-23 LAB — TROPONIN I (HIGH SENSITIVITY)
Troponin I (High Sensitivity): 5 ng/L (ref <18)
Troponin I (High Sensitivity): 7 ng/L (ref <18)

## 2023-02-23 LAB — COMPREHENSIVE METABOLIC PANEL
ALT: 46 U/L — ABNORMAL HIGH (ref 0–44)
AST: 32 U/L (ref 15–41)
Albumin: 4.3 g/dL (ref 3.5–5.0)
Alkaline Phosphatase: 58 U/L (ref 38–126)
Anion gap: 10 (ref 5–15)
BUN: 7 mg/dL (ref 6–20)
CO2: 21 mmol/L — ABNORMAL LOW (ref 22–32)
Calcium: 9.2 mg/dL (ref 8.9–10.3)
Chloride: 107 mmol/L (ref 98–111)
Creatinine, Ser: 1.06 mg/dL (ref 0.61–1.24)
GFR, Estimated: >60 mL/min (ref >60)
Glucose, Bld: 89 mg/dL (ref 70–99)
Potassium: 4.1 mmol/L (ref 3.5–5.1)
Sodium: 138 mmol/L (ref 135–145)
Total Bilirubin: 0.5 mg/dL (ref 0.0–1.2)
Total Protein: 7.9 g/dL (ref 6.5–8.1)

## 2023-02-23 LAB — CBC
HCT: 42.1 % (ref 39.0–52.0)
Hemoglobin: 14.3 g/dL (ref 13.0–17.0)
MCH: 30.6 pg (ref 26.0–34.0)
MCHC: 34 g/dL (ref 30.0–36.0)
MCV: 90.1 fL (ref 80.0–100.0)
Platelets: 254 10*3/uL (ref 150–400)
RBC: 4.67 MIL/uL (ref 4.22–5.81)
RDW: 15.4 % (ref 11.5–15.5)
WBC: 5.9 10*3/uL (ref 4.0–10.5)
nRBC: 0 % (ref 0.0–0.2)

## 2023-02-23 LAB — MAGNESIUM: Magnesium: 1.9 mg/dL (ref 1.7–2.4)

## 2023-02-23 NOTE — Telephone Encounter (Signed)
3rd attempt to call patient, no answer, left message requesting a call back Nursing will await for patient to return call

## 2023-02-23 NOTE — Discharge Instructions (Signed)
As discussed, your evaluation today has been largely reassuring.  But, it is important that you monitor your condition carefully, and do not hesitate to return to the ED if you develop new, or concerning changes in your condition. ? ?Otherwise, please follow-up with your physician for appropriate ongoing care. ? ?

## 2023-02-23 NOTE — ED Provider Notes (Signed)
[facility] EMERGENCY DEPARTMENT AT [facility] Provider Note   CSN: [id number] Arrival date & time: 02/23/23  8295     History  Chief Complaint  Patient presents with   Chest Pain    [name] is a 48 y.o. male.  HPI Patient with history of prior MI presents with concern of episodic chest pain, as well as nausea.  He was well until a few days ago, has been taking his medication as directed.  He notes that over the past few days he has had episodic left axillary pain and more frequent, but also episodic epigastric pain.  There is associated nausea, but no vomiting, no fever, mild dyspnea only.    Home Medications Prior to Admission medications   Medication Sig Start Date End Date Taking? Authorizing Provider  ARIPiprazole (ABILIFY) 15 MG tablet Take 1 tablet (15 mg total) by mouth daily. 02/28/17   Meuth, Lina Sar, PA-C  aspirin EC 81 MG tablet Take 1 tablet (81 mg total) by mouth daily. 05/14/21   Cannon Kettle, PA-C  clopidogrel (PLAVIX) 75 MG tablet Take 1 tablet (75 mg total) by mouth daily. 02/21/23   Lennette Bihari, MD  lisinopril (ZESTRIL) 5 MG tablet Take 1 tablet (5 mg total) by mouth daily. 02/21/23   Lennette Bihari, MD  metoprolol succinate (TOPROL-XL) 25 MG 24 hr tablet TAKE ONE-HALF TABLET BY MOUTH  ONCE DAILY 02/21/23   Lennette Bihari, MD  nitroGLYCERIN (NITROSTAT) 0.4 MG SL tablet Place 1 tablet (0.4 mg total) under the tongue every 5 (five) minutes x 3 doses as needed for chest pain. 02/21/23   Lennette Bihari, MD  rosuvastatin (CRESTOR) 40 MG tablet Take 1 tablet (40 mg total) by mouth daily. 02/21/23   Lennette Bihari, MD  traZODone (DESYREL) 100 MG tablet Take 1 tablet (100 mg total) by mouth at bedtime as needed for sleep. 02/28/17   Meuth, Brooke A, PA-C      Allergies    Iodine, Shellfish allergy, and Contrast media [iodinated contrast media]    Review of Systems   Review of Systems  Physical Exam Updated Vital Signs BP 122/73 (BP  Location: Right Arm)   Pulse (!) 59   Temp 98 F (36.7 C) (Oral)   Resp 19   Ht 6' (1.829 m)   Wt 81.6 kg   SpO2 100%   BMI 24.41 kg/m  Physical Exam Vitals and nursing note reviewed.  Constitutional:      General: He is not in acute distress.    Appearance: He is well-developed.  HENT:     Head: Normocephalic and atraumatic.  Eyes:     Conjunctiva/sclera: Conjunctivae normal.  Cardiovascular:     Rate and Rhythm: Normal rate and regular rhythm.  Pulmonary:     Effort: Pulmonary effort is normal. No respiratory distress.     Breath sounds: No stridor.  Abdominal:     General: There is no distension.  Skin:    General: Skin is warm and dry.  Neurological:     Mental Status: He is alert and oriented to person, place, and time.     ED Results / Procedures / Treatments   Labs (all labs ordered are listed, but only abnormal results are displayed) Labs Reviewed  COMPREHENSIVE METABOLIC PANEL - Abnormal; Notable for the following components:      Result Value   CO2 21 (*)    ALT 46 (*)    All other components  within normal limits  RESP PANEL BY RT-PCR (RSV, FLU A&B, COVID)  RVPGX2  CBC  MAGNESIUM  TROPONIN I (HIGH SENSITIVITY)  TROPONIN I (HIGH SENSITIVITY)    EKG EKG Interpretation Date/Time:  Wednesday February 23 2023 08:52:37 EST Ventricular Rate:  59 PR Interval:  156 QRS Duration:  108 QT Interval:  418 QTC Calculation: 413 R Axis:   14  Text Interpretation: Sinus bradycardia Incomplete right bundle branch block Inferior infarct , age undetermined similar to prior Abnormal ECG Confirmed by Gerhard Munch [phone]) on 02/23/2023 8:58:20 AM  Radiology DG Chest 2 View Result Date: 02/23/2023 CLINICAL DATA:  Chest pain EXAM: CHEST - 2 VIEW COMPARISON:  07/04/2018 FINDINGS: The heart size and mediastinal contours are within normal limits. Both lungs are clear. The visualized skeletal structures are unremarkable. Coronary stents again noted. IMPRESSION: No active  cardiopulmonary disease. Electronically Signed   By: Judie Petit.  Shick M.D.   On: 02/23/2023 09:25    Procedures Procedures    Medications Ordered in ED Medications - No data to display  ED Course/ Medical Decision Making/ A&P                                 Medical Decision Making Patient with history of prior MI, now presents with chest pain.  Given the duration of days, without decompensation, ACS is a consideration, but less suspicion for gastroesophageal versus infectious or as contributing to his presentation. Cardiac 60 sinus normal Pulse ox 99% room air normal  Amount and/or Complexity of Data Reviewed External Data Reviewed: notes. Labs: ordered. Decision-making details documented in ED Course. Radiology: ordered and independent interpretation performed. Decision-making details documented in ED Course. ECG/medicine tests: ordered and independent interpretation performed. Decision-making details documented in ED Course.  Risk Prescription drug management. Decision regarding hospitalization. Diagnosis or treatment significantly limited by social determinants of health.   2:33 PM Patient awake and alert, requesting discharge.  His second opponent is normal, no he has elevated risk profile with his history, which is normal troponin, passage of about 1 day prior to the onset of symptoms, nonischemic EKG, little evidence for ACS.  No evidence for bacteremia, sepsis, flu panel is negative, patient's request for discharge was accommodated.        Final Clinical Impression(s) / ED Diagnoses Final diagnoses:  Atypical chest pain    Rx / DC Orders ED Discharge Orders     None         Gerhard Munch, MD 02/23/23 1433

## 2023-02-23 NOTE — ED Triage Notes (Signed)
Pt here for "burning in chest" and diarrhea that started 4 days ago. Central chest pain. Denies n/v/ fevers. Axox4. Pt states hx of 6 MI's.

## 2023-02-23 NOTE — ED Provider Triage Note (Signed)
Emergency Medicine Provider Triage Evaluation Note  [name] , a 48 y.o. male  was evaluated in triage.  Pt complains of diarrhea for the last 3 days this morning alone has had 4-5 episodes as well as a sharp quick substernal/epigastric chest pain.  Patient reports history of cardiac disease and has had 3 stents placed and does report that he often gets pain in the left side of his chest with some tingling in his arm but the pain in the center of his chest is new in the last 3 days.  He denies any pain at this time.  Also complaining of some epigastric pain.  Denies any infectious sources such as bad food exposure or known sick contacts.  No recent medication changes.  Also complains of some mild shortness of breath and feeling lightheaded in the last few days.  Reports has eaten very little  Review of Systems  Positive: Chest pain, diarrhea, sob Negative: N/v, blood in stool  Physical Exam  BP 126/80   Pulse 60   Temp 97.8 F (36.6 C)   Resp 17   Ht 6' (1.829 m)   Wt 81.6 kg   SpO2 99%   BMI 24.41 kg/m  Gen:   Awake, no distress   Resp:  Normal effort  MSK:   Moves extremities without difficulty  Other:  Cardiac: RRR, abd: upper abd pain without guarding or rebound  Medical Decision Making  Medically screening exam initiated at 9:01 AM.  Appropriate orders placed.  CARMON SAHLI was informed that the remainder of the evaluation will be completed by another provider, this initial triage assessment does not replace that evaluation, and the importance of remaining in the ED until their evaluation is complete.     Gwyneth Sprout, MD 02/23/23 [id number]

## 2023-04-27 ENCOUNTER — Other Ambulatory Visit: Payer: Self-pay | Admitting: Cardiovascular Disease

## 2023-05-26 ENCOUNTER — Ambulatory Visit: Payer: Self-pay | Admitting: Cardiovascular Disease

## 2023-05-29 ENCOUNTER — Other Ambulatory Visit: Payer: Self-pay | Admitting: Cardiovascular Disease

## 2023-05-31 ENCOUNTER — Other Ambulatory Visit: Payer: Self-pay | Admitting: Cardiovascular Disease

## 2023-06-26 ENCOUNTER — Other Ambulatory Visit: Payer: Self-pay | Admitting: Cardiovascular Disease

## 2023-07-07 ENCOUNTER — Encounter: Payer: Self-pay | Admitting: Cardiovascular Disease

## 2023-07-07 ENCOUNTER — Ambulatory Visit: Attending: Cardiovascular Disease | Admitting: Cardiovascular Disease

## 2023-07-07 DIAGNOSIS — F3160 Bipolar disorder, current episode mixed, unspecified: Secondary | ICD-10-CM

## 2023-07-07 DIAGNOSIS — Z87891 Personal history of nicotine dependence: Secondary | ICD-10-CM

## 2023-07-07 DIAGNOSIS — I10 Essential (primary) hypertension: Secondary | ICD-10-CM

## 2023-07-07 DIAGNOSIS — I2510 Atherosclerotic heart disease of native coronary artery without angina pectoris: Secondary | ICD-10-CM | POA: Diagnosis not present

## 2023-07-07 DIAGNOSIS — E785 Hyperlipidemia, unspecified: Secondary | ICD-10-CM | POA: Diagnosis not present

## 2023-07-07 DIAGNOSIS — Z9861 Coronary angioplasty status: Secondary | ICD-10-CM

## 2023-07-07 DIAGNOSIS — I2119 ST elevation (STEMI) myocardial infarction involving other coronary artery of inferior wall: Secondary | ICD-10-CM

## 2023-07-07 MED ORDER — NITROGLYCERIN 0.4 MG SL SUBL: 0.4000 mg | SUBLINGUAL_TABLET | SUBLINGUAL | 3 refills | Status: AC | PRN

## 2023-07-07 MED ORDER — METOPROLOL SUCCINATE ER 25 MG PO TB24
12.5000 mg | ORAL_TABLET | Freq: Every day | ORAL | 4 refills | Status: AC
Start: 2023-07-07 — End: ?

## 2023-07-07 MED ORDER — ROSUVASTATIN CALCIUM 40 MG PO TABS: 40.0000 mg | ORAL_TABLET | Freq: Every day | ORAL | 4 refills | Status: AC

## 2023-07-07 MED ORDER — LISINOPRIL 5 MG PO TABS
5.0000 mg | ORAL_TABLET | Freq: Every day | ORAL | 4 refills | Status: AC
Start: 2023-07-07 — End: ?

## 2023-07-07 NOTE — Progress Notes (Signed)
 Patient ID: [name], male   DOB: [birth year], 48 y.o.   MRN: [id number]      HPI: [name] is a 48 y.o. male who presents to the office today for a 3-1/2-year follow up cardiology evaluation.  I last saw him on December 17, 2019.  [name] has a history of CAD and suffered an inferior wall ST segment elevation myocardial infarction treated with bare-metal stenting to his RCA in 2009.  He has history of hypertension, hyperlipidemia,bipolar disorder, tobacco use, and marijuana use.  In the medical records there is indication of possible crack cocaine use, but the patient vehemently denies this.  He was admitted to [facility] on 07/21/2013 with squeezing chest pressure and ruled in for a non-STEMI with a peak troponin of 16.  He had transient nonsustained VT on telemetry, related to his infarct.  He was premedicated for possible dye allergy, and underwent cardiac catheterization by Dr. Swaziland, which revealed occlusion of the first diagonal vessel, and focal restenosis within the previously placed distal RCA stent.  This was treated with 3.5 x 10 mm cutting balloon intervention to the in-stent restenosis with plans for medical therapy for the diagonal occlusion.  He had mild 20% stenoses in the LAD, and in addition to occlusion of the first diagonal vessel, the second diagonal vessel, 70% stenosis at the ostium.  The circumflex vessel had 50% stenosis in the midportion.  The proximal RCA had disease of 30% and had 80-90% focal in-stent restenosis.  Subsequently, he has undergone catheterization in 2016 by Dr. Anner, in May 2017 by Dr. Swaziland and September 2017 by Dr. Dann and his last catheterization was in July 2018 by Dr. Swaziland.  At his last catheterization he was found to have single-vessel obstruction involving a small diagonal branch, with mild stenoses in the LAD and circumflex with continued patency of stents in the proximal and distal RCA.  He had mild-to-moderate LV  dysfunction with an EF of 45% and mild inferior akinesis.  He was hospitalized on the psychiatry service with suicidal ideaion when he was off his bipolar medication.  Since being discharged, he believes he is stable.   I had not seen him since 2015 until I saw him in November 2018 for evaluation.  He denied any chest pain but was using marijuana daily and drinking daily beer.  He denied ever using cocaine.  He smokes cigarettes.  He denied exertional chest tightness.   He denied palpitations, PND, orthopnea.  When I saw him, his blood pressure was stable on lisinopril .  I recommended initiation of low-dose Toprol -XL 12.5 mg in light of his underlying CAD.  I recommended initiation of Crestor  40 mg since his LDL was 202, and total cholesterol 262.  We discussed smoking cessation.  He was hospitalized on 02/20/2017 with severe nausea, vomiting, and was found to have a perforated gastric ulcer and was hospitalized for 8 days.  He underwent exploratory laparotomy by Dr. Ebbie and had an omental patch placed to his gastric ulcer and Stamm gastrostomy.  I  saw him in March 2019 at which time he denied any chest pain PND orthopnea.   He tolerated his exploratory laparotomy without cardiovascular compromise.  He is trying to quit tobacco and has a quit date set.  He is also significantly trying to reduce his marijuana intake.    I saw him in December 2020.  Over the prior year he remained stable from a cardiac standpoint.  He now smokes 1  to 2 cigarettes every other day but he smokes marijuana daily.  He denies chest pain.  He also has had some issues with heartburn for which he had taken pantoprazole .  He also has had some panic attacks.  He has abipolar disorder and is on Abilify  in addition to trazodone .  He continues to be on rosuvastatin  40 mg for hyperlipidemia.  He continues to be on DAPT with aspirin /Plavix .  He is on lisinopril  5 mg and Toprol -XL 12.5 mg for blood pressure and his CAD.    I last saw  him on December 17, 2019.  Since his prior evaluation he continued to be stable.  He specifically denies chest pain PND or orthopnea.  He walks minutes twice a day without chest pain or shortness of breath.  He has not had recent laboratory.  He states he is still smoking approximately 5 cigarettes/day.  He feels well.  He continues to be on DAPT with aspirin /Plavix .  He is on lisinopril  5 mg, Toprol -XL 12.5 mg, and rosuvastatin  40 mg daily.  He continues to be on Abilify  and does not recall for his bipolar disorder.    He saw Delon Holts, PA-C on May 14, 2021 at which time he felt well.  He denied chest pain or shortness of breath.  He had been told that he snores but he was not interested in CPAP or sleep evaluation.  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  After smoking for many years he finally quit tobacco in April 2025.  He denies any chest pain or shortness of breath and is unaware of palpitations.  He is on medical regimen of aspirin  81 mg, lisinopril  5 mg, metoprolol  succinate 12.5 mg daily, rosuvastatin  40 mg, and trazodone  100 mg at bedtime.  He also is on Abilify  15 mg daily.  He presents for 3-1/2-year follow-up evaluation with me.   Past Medical History:  Diagnosis Date   Anxiety    Bipolar 1 disorder (HCC)    CAD S/P percutaneous coronary angioplasty    a. 01/2007 Inf STEMI: BMS-> RCA; b. 07/2013 NSTEMI: PTCA->RCA for ISR; c. 01/2014: Inf STEMI->PTCA/DES to pRCA &dRCA; d. 12/2014 Inf STEMI: RCA patent prox stent, 173m/d (3.0x32 Synergy DES); e. 05/2015 Inf STEMI: D1 75, RCA stents ok->Med rx; f. 09/2015 PCI: RCA 146m/d (thrombectomy); g. 07/2016 Cath: LM nl, LAD 40, D1 80, LCX ok, OM1 25, OM2 30, RCA stents ok, RPDA 30; h. 10/2017 MV: No isch.   Hx of medication noncompliance    Hyperlipidemia    Hypertension    Hypertensive heart disease    Ischemic cardiomyopathy    a. EF 40% in 2011, 60% in 2012. b. EF 55% by cath 07/2013. c. EF 50% by cath 01/2014; d.  12/2014 EF 35-45% by LV gram; e. 05/2015 Echo: EF 50-55% inflat/inf AKI; f. 06/2015 cMRI: EF 49%, basal & mid inf full thickness scar, subendocardial scar in antsept and antlat wall, correlating w/ D1 dzs; g. 02/2017 Echo: EF 45-50%, diff HK. Antsep/infsept AK. Mod AI, triv MR.   NSVT (nonsustained ventricular tachycardia) (HCC)    a. Very brief run during 07/2013 admit for NSTEMI felt due to MI.   Polysubstance abuse (HCC)    a. h/o tobacco, marijuana and crack cocaine use. b. 07/2013: +UDS THC, neg for cocaine.   Schizophrenia (HCC)    pt report    Tobacco abuse     Past Surgical History:  Procedure Laterality Date   CARDIAC CATHETERIZATION  01/16/2009   normal left main, Cfx with 2-OMs both w/minor irregularities, LAd with 20-30% mid region irregularities, ramus intermediate/optional diagonal with 60% osital narrowing, RCA with stent in distal portion w/20% prox in-stent stenosis (Dr. RONAL Ly)   CARDIAC CATHETERIZATION  07/23/2013   two vessel obstructive CAD, occluded first diagonal, focal in-stent restenosis in distal RCA (Dr. Peter Swaziland)   CARDIAC CATHETERIZATION N/A 01/10/2015   Procedure: Left Heart Cath and Coronary Angiography;  Surgeon: Justin LELON Clay, MD;  Location: [facility] INVASIVE CV LAB;  Service: Cardiovascular;  Laterality: N/A;   CARDIAC CATHETERIZATION N/A 01/10/2015   Procedure: Coronary Stent Intervention;  Surgeon: Justin LELON Clay, MD;  Location: [facility] INVASIVE CV LAB;  Service: Cardiovascular;  Laterality: N/A;   CARDIAC CATHETERIZATION N/A 06/10/2015   Procedure: Left Heart Cath and Coronary Angiography;  Surgeon: Peter M Swaziland, MD;  Location: [facility] INVASIVE CV LAB;  Service: Cardiovascular;  Laterality: N/A;   CARDIAC CATHETERIZATION N/A 09/27/2015   Procedure: Left Heart Cath and Coronary Angiography;  Surgeon: Candyce GORMAN Reek, MD;  Location: [facility] INVASIVE CV LAB;  Service: Cardiovascular;  Laterality: N/A;   CARDIAC CATHETERIZATION N/A 09/27/2015   Procedure: Coronary Balloon  Angioplasty;  Surgeon: Candyce GORMAN Reek, MD;  Location: [facility] INVASIVE CV LAB;  Service: Cardiovascular;  Laterality: N/A;   CORONARY ANGIOPLASTY WITH STENT PLACEMENT  02/05/2007   3.5x27mm Quantum non-DES to RCA (Dr. Debby Sor)   FEMORAL ARTERY STENT     LAPAROTOMY N/A 02/20/2017   Procedure: EXPLORATORY LAPAROTOMY AND REPAIR, PATCH, OR REMOVE PERFORATION IN BOWEL;  Surgeon: Ebbie Cough, MD;  Location: [facility] OR;  Service: General;  Laterality: N/A;   LEFT AND RIGHT HEART CATHETERIZATION WITH CORONARY ANGIOGRAM N/A 01/14/2014   Procedure: LEFT AND RIGHT HEART CATHETERIZATION WITH CORONARY ANGIOGRAM;  Surgeon: Lonni JONETTA Cash, MD;  Location: [facility] CATH LAB;  Service: Cardiovascular;  Laterality: N/A;   LEFT HEART CATH AND CORONARY ANGIOGRAPHY N/A 07/12/2016   Procedure: Left Heart Cath and Coronary Angiography;  Surgeon: Swaziland, Peter M, MD;  Location: [facility] INVASIVE CV LAB;  Service: Cardiovascular;  Laterality: N/A;   LEFT HEART CATHETERIZATION WITH CORONARY ANGIOGRAM N/A 07/23/2013   Procedure: LEFT HEART CATHETERIZATION WITH CORONARY ANGIOGRAM;  Surgeon: Peter M Swaziland, MD;  Location: [facility] CATH LAB;  Service: Cardiovascular;  Laterality: N/A;    Allergies  Allergen Reactions   Iodine Anaphylaxis and Swelling   Shellfish Allergy Anaphylaxis    All shellfish   Contrast Media [Iodinated Contrast Media] Nausea And Vomiting    Current Outpatient Medications  Medication Sig Dispense Refill   ARIPiprazole  (ABILIFY ) 15 MG tablet Take 1 tablet (15 mg total) by mouth daily. 14 tablet 0   aspirin  EC 81 MG tablet Take 1 tablet (81 mg total) by mouth daily. 30 tablet 3   traZODone  (DESYREL ) 100 MG tablet Take 1 tablet (100 mg total) by mouth at bedtime as needed for sleep. 14 tablet 0   lisinopril  (ZESTRIL ) 5 MG tablet Take 1 tablet (5 mg total) by mouth daily. 100 tablet 4   metoprolol  succinate (TOPROL -XL) 25 MG 24 hr tablet Take 0.5 tablets (12.5 mg total) by mouth daily. Please keep scheduled  appointment for future refills. Thank you. 50 tablet 4   nitroGLYCERIN  (NITROSTAT ) 0.4 MG SL tablet Place 1 tablet (0.4 mg total) under the tongue every 5 (five) minutes x 3 doses as needed for chest pain. 25 tablet 3   rosuvastatin  (CRESTOR ) 40 MG tablet Take 1 tablet (40 mg total) by mouth daily. 90  tablet 4   No current facility-administered medications for this visit.    Social History   Socioeconomic History   Marital status: Single    Spouse name: Not on file   Number of children: Not on file   Years of education: Not on file   Highest education level: Not on file  Occupational History   Not on file  Tobacco Use   Smoking status: Former    Average packs/day: 1 pack/day for 30.0 years (30.0 ttl pk-yrs)    Types: Cigarettes    Start date: 04/12/2023   Smokeless tobacco: Never  Vaping Use   Vaping status: Never Used  Substance and Sexual Activity   Alcohol use: Yes    Alcohol/week: 0.0 standard drinks of alcohol    Comment: 40 oz   Drug use: Yes    Types: Marijuana    Comment: current user- daily one-two blunts   Sexual activity: Not Currently  Other Topics Concern   Not on file  Social History Narrative   Lives locally with girlfriend.  Does not routinely exercise.   Social Drivers of Corporate investment banker Strain: Not on file  Food Insecurity: Not on file  Transportation Needs: Not on file  Physical Activity: Not on file  Stress: Not on file  Social Connections: Not on file  Intimate Partner Violence: Not on file    Family History  Problem Relation Age of Onset   CAD Mother    Heart disease Mother    Heart attack Mother    Schizophrenia Mother    CAD Sister     ROS General: Negative; No fevers, chills, or night sweats HEENT: Negative; No changes in vision or hearing, sinus congestion, difficulty swallowing Pulmonary: Negative; No cough, wheezing, shortness of breath, hemoptysis Cardiovascular: See HPI:  GI: Negative; No nausea, vomiting,  diarrhea, or abdominal pain GU: Negative; No dysuria, hematuria, or difficulty voiding Musculoskeletal: Negative; no myalgias, joint pain, or weakness Hematologic: Negative; no easy bruising, bleeding Endocrine: Negative; no heat/cold intolerance; no diabetes, Neuro: Negative; no changes in balance, headaches Skin: Negative; No rashes or skin lesions Psychiatric: History of bipolar disorder.  History of substance abuse.  Prior hospitalization when off bipolar medications for suicidal ideation; panic attacks Sleep: Negative; No snoring,  daytime sleepiness, hypersomnolence, bruxism, restless legs, hypnogognic hallucinations. Other comprehensive 14 point system review is negative   Physical Exam BP 132/80   Pulse (!) 50   Ht 6' (1.829 m)   Wt 181 lb 3.2 oz (82.2 kg)   SpO2 100%   BMI 24.58 kg/m    Repeat blood pressure by me was 130/80  Wt Readings from Last 3 Encounters:  07/07/23 181 lb 3.2 oz (82.2 kg)  02/23/23 180 lb (81.6 kg)  05/14/21 173 lb 6.4 oz (78.7 kg)   General: Alert, oriented, no distress.  Skin: normal turgor, no rashes, warm and dry HEENT: Normocephalic, atraumatic. Pupils equal round and reactive to light; sclera anicteric; extraocular muscles intact;  Nose without nasal septal hypertrophy Mouth/Parynx benign; Mallinpatti scale 2 Neck: No JVD, no carotid bruits; normal carotid upstroke Lungs: clear to ausculatation and percussion; no wheezing or rales Chest wall: without tenderness to palpitation Heart: PMI not displaced, RRR, s1 s2 normal, 1/6 systolic murmur, no diastolic murmur, no rubs, gallops, thrills, or heaves Abdomen: soft, nontender; no hepatosplenomehaly, BS+; abdominal aorta nontender and not dilated by palpation. Back: no CVA tenderness Pulses 2+ Musculoskeletal: full range of motion, normal strength, no joint deformities Extremities: no clubbing  cyanosis or edema, Homan's sign negative  Neurologic: grossly nonfocal; Cranial nerves grossly  wnl Psychologic: Normal mood and affect    EKG Interpretation Date/Time:  Thursday July 07 2023 09:10:29 EDT Ventricular Rate:  50 PR Interval:  142 QRS Duration:  94 QT Interval:  436 QTC Calculation: 397 R Axis:   13  Text Interpretation: Sinus bradycardia Inferior infarct (cited on or before 11-Feb-2018) T wave abnormality, consider lateral ischemia When compared with ECG of 23-Feb-2023 08:52, Incomplete right bundle branch block is no longer Present Confirmed by Burnard Ned ([id number]) on 07/07/2023 9:29:10 AM    December 17, 2019 ECG (independently read by me): Normal sinus rhythm at 70 bpm.  Inferior Q waves 2 3 and F with T wave inversion consistent with old inferior MI.  R waves are preserved. Early transition  December 2020 CG (independently read by me): Sinus bradycardia at 56 bpm, incomplete right bundle branch block.  Old inferior infarct with Q waves 2 3 and F.  Normal intervals.  No ectopy  March 2019 ECG (independently read by me): Normal sinus rhythm at 70 bpm.  Incomplete right bundle branch block, inferior infarction with Q waves in persistent T-wave inversion with preserved R waves, early repolarization  November 2018 ECG (independently read by me):Normal sinus rhythm at 67 bpm.  Inferior Q waves concordant with old inferior MI, but preserved R waves.  Incomplete right bundle branch block.  Normal intervals.  No ectopy.  August 2015 ECG (independently read by me): Normal sinus rhythm  LABS:  BMET    Component Value Date/Time   NA 138 02/23/2023 1129   NA 138 05/14/2021 1240   K 4.1 02/23/2023 1129   CL 107 02/23/2023 1129   CO2 21 (L) 02/23/2023 1129   GLUCOSE 89 02/23/2023 1129   BUN 7 02/23/2023 1129   BUN 8 05/14/2021 1240   CREATININE 1.06 02/23/2023 1129   CALCIUM  9.2 02/23/2023 1129   GFRNONAA >60 02/23/2023 1129   GFRAA >60 07/04/2018 1028     Hepatic Function Panel     Component Value Date/Time   PROT 7.9 02/23/2023 1129   PROT 7.6 05/14/2021  1240   ALBUMIN  4.3 02/23/2023 1129   ALBUMIN  4.7 05/14/2021 1240   AST 32 02/23/2023 1129   ALT 46 (H) 02/23/2023 1129   ALKPHOS 58 02/23/2023 1129   BILITOT 0.5 02/23/2023 1129   BILITOT 0.3 05/14/2021 1240   BILIDIR 0.1 07/04/2018 1028   IBILI 0.5 07/04/2018 1028     CBC    Component Value Date/Time   WBC 5.9 02/23/2023 0901   RBC 4.67 02/23/2023 0901   HGB 14.3 02/23/2023 0901   HGB 12.9 (L) 05/14/2021 1240   HCT 42.1 02/23/2023 0901   HCT 39.6 05/14/2021 1240   PLT 254 02/23/2023 0901   PLT 200 05/14/2021 1240   MCV 90.1 02/23/2023 0901   MCV 92 05/14/2021 1240   MCH 30.6 02/23/2023 0901   MCHC 34.0 02/23/2023 0901   RDW 15.4 02/23/2023 0901   RDW 14.4 05/14/2021 1240   LYMPHSABS 1.4 02/28/2017 0302   MONOABS 0.3 02/28/2017 0302   EOSABS 0.1 02/28/2017 0302   BASOSABS 0.0 02/28/2017 0302     BNP    Component Value Date/Time   PROBNP 51.6 07/21/2013 0530    Lipid Panel     Component Value Date/Time   CHOL 171 05/14/2021 1240   TRIG 70 05/14/2021 1240   HDL 54 05/14/2021 1240   CHOLHDL 3.2 05/14/2021 1240  CHOLHDL 4.2 11/03/2017 0322   VLDL 16 11/03/2017 0322   LDLCALC 104 (H) 05/14/2021 1240     RADIOLOGY: No results found.  IMPRESSION: 1. ST elevation myocardial infarction (STEMI) of inferior wall (HCC): 2009   2. CAD S/P BMS PCI to RCA with PTCA for ISR --> followed by stent thrombosis - DES PCI   3. Essential hypertension   4. Hyperlipidemia with target LDL less than 70   5. Bipolar I disorder, most recent episode mixed (HCC)   6. History of tobacco abuse: Quit April 2025     ASSESSMENT AND PLAN: [name] is a 48 year-old African-American gentleman who suffered an initial MI in January 2009 treated with bare-metal stenting to his RCA.  Ejection fraction at that time was  40%.  He suffered a NSTEMI in 2015 and was found to have high-grade in-stent restenosis to his RCA, as well as an occluded diagonal vessel.  Ejection fraction was 55% by  catheterization.   He has undergone 4 additional cardiac catheterizations with his last in July 2018.  When I saw him in November 2018 he was smoking cigarettes , drinking beer and using marijuana.  He denied ever using cocaine. Due to his recurrent need for catheterizations and restenosis I recommended starting back on DAPT and also was started on high-dose rosuvastatin  and low-dose lisinopril .   He developed gastric ulcer and required surgery on 02/20/2017.  Subsequently, he felt well and has been without anginal symptomatology.  When I last saw him in December 2021 blood pressure was stable on a medical regimen of  lisinopril  5 mg and Toprol -XL 12.5 mg.  ECG showed sinus rhythm at 70 bpm with his old inferior infarct but he does have preserved R waves concordant with myocardial salvage.  He continues to be on rosuvastatin  40 mg for hyperlipidemia.  He continues to be on DAPT without bleeding.  His bipolar disorder has been stable on his current dose of Abilify  and Desyrel .  His blood pressure today is 130/80 on his current regimen.  His ECG today shows sinus bradycardia at 50 bpm with old inferior infarct and inferior T wave abnormality.  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  Fortunately quit tobacco in April 2025.  I am recommending comprehensive laboratory be obtained in the fasting state with a comprehensive metabolic panel, CBC, TSH, fasting lipid panel and will also check LP(a).  I discussed with him my imminent retirement.  I will transition him to the care of Dr. Alm Mills for follow-up Cardiologic evaluation in 1 year or sooner as needed.   Debby RONAL Sor, MD, FACC  07/13/2023 1:41 PM

## 2023-07-07 NOTE — Patient Instructions (Signed)
 Medication Instructions:  Your physician recommends that you continue on your current medications as directed. Please refer to the Current Medication list given to you today.  *If you need a refill on your cardiac medications before your next appointment, please call your pharmacy*   Lab Work: Your physician recommends that you have labs drawn today: CMET, CBC, TSH, Lipids, LP(a)  If you have labs (blood work) drawn today and your tests are completely normal, you will receive your results only by: MyChart Message (if you have MyChart) OR A paper copy in the mail If you have any lab test that is abnormal or we need to change your treatment, we will call you to review the results.   Follow-Up: At [facility], you and your health needs are our priority.  As part of our continuing mission to provide you with exceptional heart care, our providers are all part of one team.  This team includes your primary Cardiologist (physician) and Advanced Practice Providers or APPs (Physician Assistants and Nurse Practitioners) who all work together to provide you with the care you need, when you need it.  Your next appointment:   12 month(s)  Provider:   Dr. Alm Clay   We recommend signing up for the patient portal called MyChart.  Sign up information is provided on this After Visit Summary.  MyChart is used to connect with patients for Virtual Visits (Telemedicine).  Patients are able to view lab/test results, encounter notes, upcoming appointments, etc.  Non-urgent messages can be sent to your provider as well.   To learn more about what you can do with MyChart, go to [email or web address].

## 2023-07-13 ENCOUNTER — Encounter: Payer: Self-pay | Admitting: Cardiovascular Disease

## 2023-09-20 ENCOUNTER — Other Ambulatory Visit: Payer: Self-pay

## 2023-09-22 ENCOUNTER — Telehealth: Payer: Self-pay

## 2023-09-22 MED ORDER — CLOPIDOGREL BISULFATE 75 MG PO TABS: 75.0000 mg | ORAL_TABLET | Freq: Every day | ORAL | 2 refills | Status: AC

## 2023-09-22 NOTE — Telephone Encounter (Signed)
 [facility] mail order pharmacy is requesting a refill on medication clopidogrel  75 mg tablet. This medication was D/C off of pt's medication list, but at last office visit with Dr. Burnard, pt was supposed to continue taking this medication. Does pt still supposed to be taking this medication? Please address

## 2023-09-22 NOTE — Telephone Encounter (Signed)
 Per last OV note from Dr. Burnard on 07/07/23: Due to his recurrent need for catheterizations and restenosis I recommended starting back on DAPT and also was started on high-dose rosuvastatin  and low-dose lisinopril .    Rx for Plavix  75 mg daily sent to [facility] under DOD, patient to establish with Dr. Anner in June 2026 and can discuss continuing/refills at that time.

## 2024-01-03 ENCOUNTER — Encounter (HOSPITAL_COMMUNITY): Payer: Self-pay

## 2024-01-03 ENCOUNTER — Emergency Department (HOSPITAL_COMMUNITY)
Admission: EM | Admit: 2024-01-03 | Discharge: 2024-01-03 | Discharge status: Against Medical Advice | Attending: Emergency Medicine | Admitting: Emergency Medicine

## 2024-01-03 ENCOUNTER — Emergency Department (HOSPITAL_COMMUNITY)

## 2024-01-03 ENCOUNTER — Other Ambulatory Visit: Payer: Self-pay

## 2024-01-03 DIAGNOSIS — R059 Cough, unspecified: Secondary | ICD-10-CM | POA: Insufficient documentation

## 2024-01-03 DIAGNOSIS — R0789 Other chest pain: Secondary | ICD-10-CM | POA: Diagnosis present

## 2024-01-03 DIAGNOSIS — I10 Essential (primary) hypertension: Secondary | ICD-10-CM | POA: Insufficient documentation

## 2024-01-03 DIAGNOSIS — I2510 Atherosclerotic heart disease of native coronary artery without angina pectoris: Secondary | ICD-10-CM | POA: Diagnosis not present

## 2024-01-03 DIAGNOSIS — Z5329 Procedure and treatment not carried out because of patient's decision for other reasons: Secondary | ICD-10-CM | POA: Insufficient documentation

## 2024-01-03 DIAGNOSIS — F17210 Nicotine dependence, cigarettes, uncomplicated: Secondary | ICD-10-CM | POA: Diagnosis not present

## 2024-01-03 LAB — CBC
HCT: 40.5 % (ref 39.0–52.0)
Hemoglobin: 13.5 g/dL (ref 13.0–17.0)
MCH: 30.7 pg (ref 26.0–34.0)
MCHC: 33.3 g/dL (ref 30.0–36.0)
MCV: 92 fL (ref 80.0–100.0)
Platelets: 254 K/uL (ref 150–400)
RBC: 4.4 MIL/uL (ref 4.22–5.81)
RDW: 16.7 % — ABNORMAL HIGH (ref 11.5–15.5)
WBC: 7.9 K/uL (ref 4.0–10.5)
nRBC: 0 % (ref 0.0–0.2)

## 2024-01-03 LAB — BASIC METABOLIC PANEL WITH GFR
Anion gap: 11 (ref 5–15)
BUN: 5 mg/dL — ABNORMAL LOW (ref 6–20)
CO2: 22 mmol/L (ref 22–32)
Calcium: 8.9 mg/dL (ref 8.9–10.3)
Chloride: 106 mmol/L (ref 98–111)
Creatinine, Ser: 1.01 mg/dL (ref 0.61–1.24)
GFR, Estimated: >60 mL/min
Glucose, Bld: 93 mg/dL (ref 70–99)
Potassium: 4 mmol/L (ref 3.5–5.1)
Sodium: 138 mmol/L (ref 135–145)

## 2024-01-03 LAB — RESP PANEL BY RT-PCR (RSV, FLU A&B, COVID)  RVPGX2
Influenza A by PCR: NEGATIVE
Influenza B by PCR: NEGATIVE
Resp Syncytial Virus by PCR: NEGATIVE
SARS Coronavirus 2 by RT PCR: NEGATIVE

## 2024-01-03 LAB — TROPONIN T, HIGH SENSITIVITY: Troponin T High Sensitivity: <15 ng/L (ref 0–19)

## 2024-01-03 MED ORDER — ASPIRIN 325 MG PO TBEC
325.0000 mg | DELAYED_RELEASE_TABLET | Freq: Once | ORAL | Status: DC
  Filled 2024-01-03: qty 1

## 2024-01-03 NOTE — ED Provider Triage Note (Signed)
 Emergency Medicine Provider Triage Evaluation Note  ALDRIDGE KRZYZANOWSKI , a 48 y.o. male  was evaluated in triage.  Pt complains of 3 days of chest pressure as if something is sitting on my chest, it feels like my prior heart attacks.  He states that he has had a dry cough as well as some pain in between his shoulder blades.  He endorses some pleuritic component of pain.  He endorses nausea.  Review of Systems  Positive: CP, congestion, cough Negative: Abd pain, SOB  Physical Exam  BP 135/81   Pulse 62   Temp 98.3 F (36.8 C)   Resp 17   SpO2 98%  Gen:   Awake, no distress  Resp:  Normal effort, lungs CTAB MSK:   Moves extremities without difficulty Other:    Medical Decision Making  Medically screening exam initiated at 12:06 PM.  Appropriate orders placed.  VANDERBILT RANIERI was informed that the remainder of the evaluation will be completed by another provider, this initial triage assessment does not replace that evaluation, and the importance of remaining in the ED until their evaluation is complete.  CP workup initiated from triage to include EKG, CXR, labs   Jerrol Agent, MD 01/03/24 1211

## 2024-01-03 NOTE — ED Provider Notes (Addendum)
 " [place] [facility] Provider Note   HPI/ROS    History obtained from   [name] is a 48 y.o. male who presents for Chest Pain and Nasal Congestion and who  has a past medical history of Anxiety, Bipolar 1 disorder (HCC), CAD S/P percutaneous coronary angioplasty, medication noncompliance, Hyperlipidemia, Hypertension, Hypertensive heart disease, Ischemic cardiomyopathy, NSVT (nonsustained ventricular tachycardia) (HCC), Polysubstance abuse (HCC), Schizophrenia (HCC), and Tobacco abuse.  Patient presents today for 3 days of ongoing chest pressure where he feels like somebody standing on his chest.  States that he woke up with it 3 days ago and has been slowly improving since.  States that when he moves around that it gets worse, but does not feel that it is significantly worse when he is exerting himself more.  Does feel like his chest pain increases when he is breathing.  Denies any recent fevers or cough, but has had a runny nose for the last several days.  Denies any nausea, vomiting, diarrhea, headaches, or visual changes.  No numbness or tingling in the extremities or difficulty ambulating.  Denies any recent missed doses of medication.  States that he took aspirin  at home per EMS recommendations and got more aspirin  here and felt better.  Currently states he is largely chest pain-free.  MDM   I have reviewed the nursing documentation, vital signs, as well as the past medical history, surgical history, family history, and social history.  Initial Assessment:  Hemodynamically stable on initial evaluation without hypotension, tachypnea, tachycardia, or hypoxia.  Afebrile here.  Currently states that he is largely chest pain-free.  EKG largely unchanged from prior aside from T waves no longer being inverted in the anterior and lateral leads aside from V6.  T wave inversions in 2 and 3 persistent with large T waves in the anterior leads.  No obvious STEMI,  high-grade AV block, or other dysrhythmia.  Sinus bradycardia.  Labs obtained in first look largely unremarkable.  COVID flu negative.  CBC with no significant leukocytosis or anemia.  BMP with no significant electrolyte abnormality or AKI.  Initial troponin negative at 15.  Chest x-ray interpreted by myself with no obvious focal airspace disease, subdiaphragmatic air, or pneumothorax.  Agree with radiology.  States he got aspirin  with EMS and states he got aspirin  here.  Low concern for ACS given maximal onset of chest pain was 3 days ago and has been the same/steadily improved over the last 3 days.  PERC negative here, so lower concern for PE as well.  Some radial and DP pulses with no focal neurologic deficit, so less likely to be aortic dissection.  Also no mediastinal widening on chest x-ray.  EKG here not consistent with pericarditis.  Denies position allergy to pain.  No signs of pneumonia on chest x-ray.  Given unremarkable cardiac workup and chest pain has been going on for greater than 6 hours do not feel we need delta troponin.  Given unremarkable labs, normal chest x-ray, and pain is largely resolved feel patient stable for discharge.  Patient was told he is going to be discharged shortly and  unfortunately left the emergency department before I was able to obtain an echo, but have low concern for tamponade given hemodynamic stability and no electrical alternans on EKG.  Also doubt acute heart failure exacerbation given lack of signs of peripheral volume overload.    Patient stated his pain is largely resolved and he planed to follow-up with  his PCP in the outpatient setting.  Patient was also encouraged to follow-up with his cardiologist as soon as possible, but he is understand this to be difficult for the holidays.  He plans to return to the emergency department he has any returning of his chest pain or any further concerns.  Patient discharged home in hemodynamically stable condition with  strict return precautions.  We had verbally discussed discharge instructions and return precautions but patient left the emergency department prior to getting his paperwork.  Disposition:  I discussed the plan for discharge with the patient and/or their surrogate at bedside prior to discharge and they were in agreement with the plan and verbalized understanding of the return precautions provided. All questions answered to the best of my ability. Ultimately, the patient was discharged in stable condition with stable vital signs. I am reassured that they are capable of close follow up and good social support at home.     This patient was staffed with Dr. Rogelia who supervised the visit and agreed with the plan of care.   Due to the patients current presenting symptoms, physical exam findings, and the workup stated above, it is thought that the etiology of the patients current presentation is:  1. Atypical chest pain      Clinical Complexity A medically appropriate history, review of systems, and physical exam was performed.  Factors that affect the complexity of this encounter: assessment of correct protocol, laboratory work from this visit, notes from other physicians (Cardiology), and review of echocardiogram/EKG results  My independent interpretations of diagnostic studies are documented in the ED course above.   If decision rules were used in this patient's evaluation, they are listed below.   PERC  Click here for ABCD2, HEART and other calculators  Patient's presentation is most consistent with acute illness / injury with system symptoms.  MDM generated using voice dictation software and may contain dictation errors. Please contact me for any clarification or with any questions.    Physical Exam, PMH, PSH, Family History, and Social Hsitory   Vitals:   01/03/24 1106 01/03/24 1117 01/03/24 1503 01/03/24 1505  BP:  135/81  131/75  Pulse:  62  62  Resp:  17  16  Temp:  98.3  F (36.8 C) 97.6 F (36.4 C)   TempSrc:   Oral   SpO2: 98% 98%  100%    Physical Exam Constitutional:      Appearance: He is well-developed.  HENT:     Head: Normocephalic and atraumatic.  Eyes:     Pupils: Pupils are equal, round, and reactive to light.  Cardiovascular:     Rate and Rhythm: Normal rate and regular rhythm.     Pulses:          Radial pulses are 1+ on the right side and 1+ on the left side.       Dorsalis pedis pulses are 1+ on the right side and 1+ on the left side.     Heart sounds: Normal heart sounds.  Pulmonary:     Effort: Pulmonary effort is normal.     Breath sounds: No decreased breath sounds, wheezing or rhonchi.  Abdominal:     Palpations: Abdomen is soft.     Tenderness: There is no abdominal tenderness. There is no guarding or rebound.  Musculoskeletal:     Cervical back: Normal range of motion.     Right lower leg: No edema.     Left lower leg: No edema.  Skin:    General: Skin is warm and dry.  Neurological:     Mental Status: He is alert.     Past Medical History:  Diagnosis Date   Anxiety    Bipolar 1 disorder (HCC)    CAD S/P percutaneous coronary angioplasty    a. 01/2007 Inf STEMI: BMS-> RCA; b. 07/2013 NSTEMI: PTCA->RCA for ISR; c. 01/2014: Inf STEMI->PTCA/DES to pRCA &dRCA; d. 12/2014 Inf STEMI: RCA patent prox stent, 135m/d (3.0x32 Synergy DES); e. 05/2015 Inf STEMI: D1 75, RCA stents ok->Med rx; f. 09/2015 PCI: RCA 148m/d (thrombectomy); g. 07/2016 Cath: LM nl, LAD 40, D1 80, LCX ok, OM1 25, OM2 30, RCA stents ok, RPDA 30; h. 10/2017 MV: No isch.   Hx of medication noncompliance    Hyperlipidemia    Hypertension    Hypertensive heart disease    Ischemic cardiomyopathy    a. EF 40% in 2011, 60% in 2012. b. EF 55% by cath 07/2013. c. EF 50% by cath 01/2014; d. 12/2014 EF 35-45% by LV gram; e. 05/2015 Echo: EF 50-55% inflat/inf AKI; f. 06/2015 cMRI: EF 49%, basal & mid inf full thickness scar, subendocardial scar in antsept and antlat wall,  correlating w/ D1 dzs; g. 02/2017 Echo: EF 45-50%, diff HK. Antsep/infsept AK. Mod AI, triv MR.   NSVT (nonsustained ventricular tachycardia) (HCC)    a. Very brief run during 07/2013 admit for NSTEMI felt due to MI.   Polysubstance abuse (HCC)    a. h/o tobacco, marijuana and crack cocaine use. b. 07/2013: +UDS THC, neg for cocaine.   Schizophrenia (HCC)    pt report    Tobacco abuse      Past Surgical History:  Procedure Laterality Date   CARDIAC CATHETERIZATION  01/16/2009   normal left main, Cfx with 2-OMs both w/minor irregularities, LAd with 20-30% mid region irregularities, ramus intermediate/optional diagonal with 60% osital narrowing, RCA with stent in distal portion w/20% prox in-stent stenosis (Dr. RONAL Ly)   CARDIAC CATHETERIZATION  07/23/2013   two vessel obstructive CAD, occluded first diagonal, focal in-stent restenosis in distal RCA (Dr. Peter Jordan)   CARDIAC CATHETERIZATION N/A 01/10/2015   Procedure: Left Heart Cath and Coronary Angiography;  Surgeon: Alm LELON Clay, MD;  Location: [facility] INVASIVE CV LAB;  Service: Cardiovascular;  Laterality: N/A;   CARDIAC CATHETERIZATION N/A 01/10/2015   Procedure: Coronary Stent Intervention;  Surgeon: Alm LELON Clay, MD;  Location: [facility] INVASIVE CV LAB;  Service: Cardiovascular;  Laterality: N/A;   CARDIAC CATHETERIZATION N/A 06/10/2015   Procedure: Left Heart Cath and Coronary Angiography;  Surgeon: Peter M Jordan, MD;  Location: [facility] INVASIVE CV LAB;  Service: Cardiovascular;  Laterality: N/A;   CARDIAC CATHETERIZATION N/A 09/27/2015   Procedure: Left Heart Cath and Coronary Angiography;  Surgeon: Candyce GORMAN Reek, MD;  Location: [facility] INVASIVE CV LAB;  Service: Cardiovascular;  Laterality: N/A;   CARDIAC CATHETERIZATION N/A 09/27/2015   Procedure: Coronary Balloon Angioplasty;  Surgeon: Candyce GORMAN Reek, MD;  Location: [facility] INVASIVE CV LAB;  Service: Cardiovascular;  Laterality: N/A;   CORONARY ANGIOPLASTY WITH STENT PLACEMENT  02/05/2007    3.5x83mm Quantum non-DES to RCA (Dr. Debby Sor)   FEMORAL ARTERY STENT     LAPAROTOMY N/A 02/20/2017   Procedure: EXPLORATORY LAPAROTOMY AND REPAIR, PATCH, OR REMOVE PERFORATION IN BOWEL;  Surgeon: Ebbie Cough, MD;  Location: [facility] OR;  Service: General;  Laterality: N/A;   LEFT AND RIGHT HEART CATHETERIZATION WITH CORONARY ANGIOGRAM N/A 01/14/2014   Procedure: LEFT AND RIGHT HEART  CATHETERIZATION WITH CORONARY ANGIOGRAM;  Surgeon: Lonni JONETTA Cash, MD;  Location: [facility] CATH LAB;  Service: Cardiovascular;  Laterality: N/A;   LEFT HEART CATH AND CORONARY ANGIOGRAPHY N/A 07/12/2016   Procedure: Left Heart Cath and Coronary Angiography;  Surgeon: Jordan, Peter M, MD;  Location: [facility] INVASIVE CV LAB;  Service: Cardiovascular;  Laterality: N/A;   LEFT HEART CATHETERIZATION WITH CORONARY ANGIOGRAM N/A 07/23/2013   Procedure: LEFT HEART CATHETERIZATION WITH CORONARY ANGIOGRAM;  Surgeon: Peter M Jordan, MD;  Location: [facility] CATH LAB;  Service: Cardiovascular;  Laterality: N/A;     Family History  Problem Relation Age of Onset   CAD Mother    Heart disease Mother    Heart attack Mother    Schizophrenia Mother    CAD Sister     Social History   Tobacco Use   Smoking status: Former    Average packs/day: 1 pack/day for 30.0 years (30.0 ttl pk-yrs)    Types: Cigarettes    Start date: 04/12/2023   Smokeless tobacco: Never  Substance Use Topics   Alcohol use: Yes    Alcohol/week: 0.0 standard drinks of alcohol    Comment: 40 oz     Procedures   If procedures were preformed on this patient, they are listed below:  Procedures   Electronically signed by:   Glendia Carlin Ancona, M.D. PGY-2, Emergency Medicine   Please note that this documentation was produced with the assistance of voice-to-text technology and may contain errors.    Ancona Glendia, MD 01/03/24 1531    Ancona Glendia, MD 01/03/24 8467    Ancona Glendia, MD 01/03/24 KENITH    Rogelia Jerilynn RAMAN, MD 01/04/24  [phone]  "

## 2024-01-03 NOTE — ED Triage Notes (Signed)
 Pt arrives via EMS from home. PT reports chest wall pain for the past 3 days, states pain worsens with touch, movement, or deep breath. Also reports congestion and runny nose.

## 2024-07-04 ENCOUNTER — Ambulatory Visit: Admitting: Cardiology
